# Patient Record
Sex: Female | Born: 1935 | Race: White | Smoking: Never smoker
Health system: Southern US, Community
[De-identification: ages and names within clinical notes are randomized; demographics above are authoritative.]

## PROBLEM LIST (undated history)

## (undated) DIAGNOSIS — Z950 Presence of cardiac pacemaker: Secondary | ICD-10-CM

## (undated) DIAGNOSIS — I10 Essential (primary) hypertension: Secondary | ICD-10-CM

## (undated) DIAGNOSIS — M1990 Unspecified osteoarthritis, unspecified site: Secondary | ICD-10-CM

## (undated) DIAGNOSIS — R001 Bradycardia, unspecified: Secondary | ICD-10-CM

## (undated) DIAGNOSIS — K219 Gastro-esophageal reflux disease without esophagitis: Secondary | ICD-10-CM

## (undated) DIAGNOSIS — I499 Cardiac arrhythmia, unspecified: Secondary | ICD-10-CM

## (undated) DIAGNOSIS — I82409 Acute embolism and thrombosis of unspecified deep veins of unspecified lower extremity: Secondary | ICD-10-CM

## (undated) DIAGNOSIS — I739 Peripheral vascular disease, unspecified: Secondary | ICD-10-CM

## (undated) HISTORY — PX: TONSILLECTOMY: SUR1361

## (undated) HISTORY — DX: Bradycardia, unspecified: R00.1

## (undated) HISTORY — PX: CHOLECYSTECTOMY: SHX55

## (undated) HISTORY — PX: ABDOMINAL HYSTERECTOMY: SHX81

## (undated) HISTORY — PX: AUGMENTATION MAMMAPLASTY: SUR837

## (undated) HISTORY — PX: BREAST ENHANCEMENT SURGERY: SHX7

## (undated) HISTORY — PX: APPENDECTOMY: SHX54

## (undated) HISTORY — PX: CATARACT EXTRACTION, BILATERAL: SHX1313

---

## 1998-11-29 ENCOUNTER — Encounter: Payer: Self-pay | Admitting: Cardiology

## 1998-11-29 ENCOUNTER — Inpatient Hospital Stay (HOSPITAL_COMMUNITY): Admission: EM | Admit: 1998-11-29 | Discharge: 1998-12-04 | Payer: Self-pay | Admitting: Emergency Medicine

## 1998-11-29 ENCOUNTER — Encounter: Payer: Self-pay | Admitting: Emergency Medicine

## 1998-12-02 HISTORY — PX: PACEMAKER INSERTION: SHX728

## 1999-01-03 ENCOUNTER — Encounter (HOSPITAL_COMMUNITY): Admission: RE | Admit: 1999-01-03 | Discharge: 1999-02-06 | Payer: Self-pay | Admitting: Cardiology

## 1999-02-07 ENCOUNTER — Encounter (HOSPITAL_COMMUNITY): Admission: RE | Admit: 1999-02-07 | Discharge: 1999-05-08 | Payer: Self-pay | Admitting: Cardiology

## 1999-08-07 ENCOUNTER — Encounter: Payer: Self-pay | Admitting: *Deleted

## 1999-08-07 ENCOUNTER — Ambulatory Visit (HOSPITAL_COMMUNITY): Admission: RE | Admit: 1999-08-07 | Discharge: 1999-08-07 | Payer: Self-pay | Admitting: *Deleted

## 1999-10-24 ENCOUNTER — Encounter: Admission: RE | Admit: 1999-10-24 | Discharge: 1999-11-09 | Payer: Self-pay | Admitting: Neurosurgery

## 1999-11-23 ENCOUNTER — Encounter: Admission: RE | Admit: 1999-11-23 | Discharge: 2000-01-09 | Payer: Self-pay | Admitting: Neurosurgery

## 1999-12-22 ENCOUNTER — Other Ambulatory Visit: Admission: RE | Admit: 1999-12-22 | Discharge: 1999-12-22 | Payer: Self-pay | Admitting: Obstetrics and Gynecology

## 2001-01-31 ENCOUNTER — Other Ambulatory Visit: Admission: RE | Admit: 2001-01-31 | Discharge: 2001-01-31 | Payer: Self-pay | Admitting: Obstetrics and Gynecology

## 2001-11-17 ENCOUNTER — Ambulatory Visit (HOSPITAL_COMMUNITY): Admission: RE | Admit: 2001-11-17 | Discharge: 2001-11-17 | Payer: Self-pay | Admitting: Neurosurgery

## 2001-11-17 ENCOUNTER — Encounter: Payer: Self-pay | Admitting: Neurosurgery

## 2004-01-11 ENCOUNTER — Encounter: Admission: RE | Admit: 2004-01-11 | Discharge: 2004-01-11 | Payer: Self-pay | Admitting: Internal Medicine

## 2007-02-28 ENCOUNTER — Ambulatory Visit (HOSPITAL_COMMUNITY): Admission: RE | Admit: 2007-02-28 | Discharge: 2007-03-01 | Payer: Self-pay | Admitting: *Deleted

## 2007-02-28 HISTORY — PX: PERMANENT PACEMAKER GENERATOR CHANGE: SHX6022

## 2007-10-31 ENCOUNTER — Emergency Department (HOSPITAL_COMMUNITY): Admission: EM | Admit: 2007-10-31 | Discharge: 2007-10-31 | Payer: Self-pay | Admitting: Emergency Medicine

## 2008-07-09 DIAGNOSIS — I82409 Acute embolism and thrombosis of unspecified deep veins of unspecified lower extremity: Secondary | ICD-10-CM

## 2008-07-09 HISTORY — DX: Acute embolism and thrombosis of unspecified deep veins of unspecified lower extremity: I82.409

## 2008-07-26 ENCOUNTER — Ambulatory Visit: Payer: Self-pay | Admitting: *Deleted

## 2008-08-18 ENCOUNTER — Ambulatory Visit: Payer: Self-pay | Admitting: Vascular Surgery

## 2008-10-07 ENCOUNTER — Emergency Department (HOSPITAL_COMMUNITY): Admission: EM | Admit: 2008-10-07 | Discharge: 2008-10-07 | Payer: Self-pay | Admitting: Emergency Medicine

## 2009-04-01 ENCOUNTER — Encounter: Admission: RE | Admit: 2009-04-01 | Discharge: 2009-04-01 | Payer: Self-pay | Admitting: Cardiovascular Disease

## 2009-08-16 ENCOUNTER — Encounter: Admission: RE | Admit: 2009-08-16 | Discharge: 2009-08-16 | Payer: Self-pay | Admitting: General Surgery

## 2009-10-04 ENCOUNTER — Ambulatory Visit (HOSPITAL_COMMUNITY): Admission: RE | Admit: 2009-10-04 | Discharge: 2009-10-05 | Payer: Self-pay | Admitting: Otolaryngology

## 2009-10-04 ENCOUNTER — Encounter (INDEPENDENT_AMBULATORY_CARE_PROVIDER_SITE_OTHER): Payer: Self-pay | Admitting: General Surgery

## 2010-09-21 ENCOUNTER — Other Ambulatory Visit: Payer: Self-pay | Admitting: Dermatology

## 2010-10-01 LAB — COMPREHENSIVE METABOLIC PANEL
CO2: 27 mEq/L (ref 19–32)
Chloride: 104 mEq/L (ref 96–112)
GFR calc Af Amer: >60 mL/min (ref >60)
Potassium: 4.3 mEq/L (ref 3.5–5.1)
Total Protein: 7.1 g/dL (ref 6.0–8.3)

## 2010-10-01 LAB — DIFFERENTIAL
Basophils Absolute: 0 10*3/uL (ref 0.0–0.1)
Basophils Relative: 0 % (ref 0–1)
Eosinophils Absolute: 0.1 10*3/uL (ref 0.0–0.7)
Eosinophils Relative: 1 % (ref 0–5)
Lymphocytes Relative: 13 % (ref 12–46)
Lymphs Abs: 1.5 10*3/uL (ref 0.7–4.0)
Monocytes Relative: 9 % (ref 3–12)
Neutro Abs: 9.6 10*3/uL — ABNORMAL HIGH (ref 1.7–7.7)
Neutrophils Relative %: 78 % — ABNORMAL HIGH (ref 43–77)

## 2010-10-01 LAB — CBC
HCT: 35.7 % — ABNORMAL LOW (ref 36.0–46.0)
Hemoglobin: 12.1 g/dL (ref 12.0–15.0)
Platelets: 288 10*3/uL (ref 150–400)
RBC: 3.78 MIL/uL — ABNORMAL LOW (ref 3.87–5.11)
RDW: 15 % (ref 11.5–15.5)

## 2010-11-21 NOTE — Op Note (Signed)
NAME:  Kathryn [name], Kathryn [name]               ACCOUNT NO.:  [id number]   MEDICAL RECORD NO.:  [id number]          PATIENT TYPE:  OIB   LOCATION:  2807                         FACILITY:  [facility]   PHYSICIAN:  Darlin Priestly, MD  DATE OF BIRTH:  Sep 02, 1927   DATE OF PROCEDURE:  02/28/2007  DATE OF DISCHARGE:                               OPERATIVE REPORT   PROCEDURES:  1. Explant of a Medtronic Kappa KDR 601 generator, serial [id number],      initially implanted Dec 02, 1998.  2. Atrial and ventricular lead interrogation.  3. Pocket modification.  4. Implant of a Medtronic Adapta [id number] generator, serial [id number].   COMPLICATIONS:  None.   INDICATION:  Ms. Assefa is a 77 year old female, patient of Dr. Franchot Heidelberg, Dr. Rodrigo Ran, with a history of mild mitral valve  prolapse, systemic hypertension, history of sick sinus syndrome status  post dual chamber pacer implant using a Medtronic Kappa KDR 601  generator, serial #[id number], with passive atrial and ventricular leads  on Dec 02, 1998.  She has recently reached ERI and now reverted to VVI  mode.  She is now referred for generator change out.   DESCRIPTION OF OPERATION:  After obtaining informed written consent, the  patient was brought up to the cardiac catheterization laboratory where  her right chest was prepped and draped in a sterile fashion.  ECG  monitoring was established.  Lidocaine 1% used then used to anesthetize  over the previously implanted generator.  Next, approximately a 3 cm  horizontal incision was then carried out over the top of her previous  implanted generator and hemostasis was obtained with electrocautery.  The tissue was then divided down to the pacer capsule and the capsule  was then opened with a scalpel and the generator was freed from the  capsule.  The generator and leads were then delivered.  The leads  appeared to be in excellent condition.  The atrial lead was a Medtronic  model [id number],  serial [id number], implanted Dec 02, 1998.  Ventricular lead  was a model [id number], serial [id number], also implanted Dec 02, 1998.  The  generator was then disconnected and the leads then interrogated.   P waves were measured at 3 millivolts.  Impedance on the atrial lead was  345 Ohms. Threshold on the atrial lead was 0.1 volts at 0.5  milliseconds.  Current was 1.4 milliamps.  R waves are measured at 15.5  millivolts.  Impedance 667 Ohms.  Threshold in the ventricle is 0.5  volts at 0.5 milliseconds.  Current is 1.4 milliamps.   The pocket is then copiously irrigated with 1% kanamycin solution.  Again, hemostasis was confirmed.  The leads were then connected in a  serial fashion to a Medtronic Adapta ADDR01 generator, serial  [id number].  Chassis screws were tightened and pacing was confirmed.  A  single silk suture was then placed in the superior aspect of the pocket.  The generator and leads were then delivered into the pocket and the  header was screwed to the silk suture.  Subcutaneous layers were then  closed with  running 2-0 Vicryl.  The skin was then closed using 4-0  Vicryl.  Steri-Strip was then applied.  The patient was returned to the  recovery room in stable condition.   CONCLUSIONS:  1. Successful explant of a Medtronic Kappa KDR 601 generator, serial      [id number], implanted Dec 02, 1998.  2. Interrogation of the atrial and ventricular leads with thresholds      noted above.  3. Pocket modification.  4. Implant of a Medtronic Adapta [id number] generator, serial [id number].      Darlin Priestly, MD  Electronically Signed     RHM/MEDQ  D:  02/28/2007  T:  03/01/2007  Job:  [id number]   cc:   Gerlene Burdock A. Alanda Amass, M.D.  Redge Gainer. Perini, M.D.

## 2010-11-21 NOTE — Procedures (Signed)
DUPLEX DEEP VENOUS EXAM - LOWER EXTREMITY   INDICATION:  Followup deep vein thrombosis.   HISTORY:  Edema:  No.  Trauma/Surgery:  The patient states previous bilateral vein surgeries  and laser ablation procedures.  Pain:  No.  PE:  No.  Previous DVT:  No, however, a superficial vein thrombosis was noted on a  study from 07/26/2008 here in the office.  Anticoagulants:  Yes.  Other:   DUPLEX EXAM:                CFV   SFV   PopV  PTV    GSV                R  L  R  L  R  L  R   L  R  L  Thrombosis    o  o  o  o  o  o  o   o  o  o  Spontaneous   +  +  +  +  +  +  +   +  +  +  Phasic        +  +  +  +  +  +  +   +  +  +  Augmentation  +  +  +  +  +  +  +   +  +  +  Compressible  +  +  +  +  +  +  +   +  +  +  Competent   Legend:  + - yes  o - no  p - partial  D - decreased   IMPRESSION:  1. No evidence of deep vein thrombosis noted in the bilateral lower      extremities.  2. Mild, partially occlusive chronic thrombus noted in the right      lesser saphenous vein at the proximal calf level.  3. A preliminary report was faxed to Dr. Laurey Morale office on      08/18/2008.    _____________________________  Larina Earthly, M.D.   CH/MEDQ  D:  08/19/2008  T:  08/19/2008  Job:  [id number]

## 2010-11-21 NOTE — Procedures (Signed)
DUPLEX DEEP VENOUS EXAM - LOWER EXTREMITY   INDICATION:  Right leg swelling and pain.   HISTORY:  Edema:  Trauma/Surgery:  No.  Pain:  No.  PE:  No.  Previous DVT:  No.  Anticoagulants:  No.  Other:   DUPLEX EXAM:                CFV   SFV   PopV  PTV    GSV                R  L  R  L  R  L  R   L  R  L  Thrombosis    o  o  o     o     o      o  Spontaneous   +  +  +     +     +      +  Phasic        +  +  +     +     +      +  Augmentation  +  +  +     +     +      +  Compressible  +  +  +     +     +      +  Competent     +  +  +     +     +      +   Legend:  + - yes  o - no  p - partial  D - decreased   IMPRESSION:  1. No evidence of deep venous thrombosis in the right lower extremity.  2. Sonographic evidence of superficial vein thrombosis noted in the      right lesser saphenous vein.    _____________________________  P. Liliane Bade, M.D.   AC/MEDQ  D:  07/27/2008  T:  07/27/2008  Job:  [id number]

## 2011-04-20 LAB — BASIC METABOLIC PANEL
Calcium: 9.2
GFR calc Af Amer: >60
GFR calc non Af Amer: >60
Sodium: 141

## 2011-04-20 LAB — CBC
MCHC: 33.5
Platelets: 204
RDW: 13.5
WBC: 5.4

## 2011-10-09 HISTORY — PX: US ECHOCARDIOGRAPHY: HXRAD669

## 2012-06-09 LAB — IFOBT (OCCULT BLOOD): IFOBT: NEGATIVE

## 2012-08-13 ENCOUNTER — Ambulatory Visit (INDEPENDENT_AMBULATORY_CARE_PROVIDER_SITE_OTHER): Payer: Medicare Other | Admitting: *Deleted

## 2012-08-13 DIAGNOSIS — M79609 Pain in unspecified limb: Secondary | ICD-10-CM

## 2012-12-09 ENCOUNTER — Other Ambulatory Visit: Payer: Self-pay | Admitting: Dermatology

## 2013-03-10 ENCOUNTER — Other Ambulatory Visit: Payer: Self-pay | Admitting: Internal Medicine

## 2013-03-10 DIAGNOSIS — M79604 Pain in right leg: Secondary | ICD-10-CM

## 2013-03-11 ENCOUNTER — Ambulatory Visit
Admission: RE | Admit: 2013-03-11 | Discharge: 2013-03-11 | Disposition: A | Discharge status: Home / Self Care | Payer: Medicare Other | Source: Ambulatory Visit | Attending: Internal Medicine | Admitting: Internal Medicine

## 2013-03-11 DIAGNOSIS — M79604 Pain in right leg: Secondary | ICD-10-CM

## 2013-03-11 IMAGING — CT CT L SPINE W/O CM
4 of 10 series · 11 of 33 positions shown, 13 images · non-contrast
Comparison: Plain films lumbar spine [DATE].

CLINICAL DATA: Right leg pain for 1 month.

CT LUMBAR SPINE WITHOUT CONTRAST
TECHNIQUE: Multidetector CT imaging of the lumbar spine was
performed without intravenous contrast administration. Multiplanar
CT image reconstructions were also generated.

[Series 4: l spine bone · axial · 0.27mm/px · z∈[-164,-82]mm · 2 of 100 slices shown]
[im 34/100  bone]
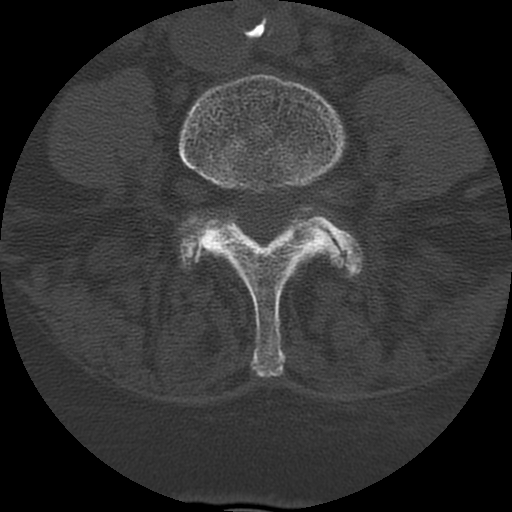
[im 67/100  bone]
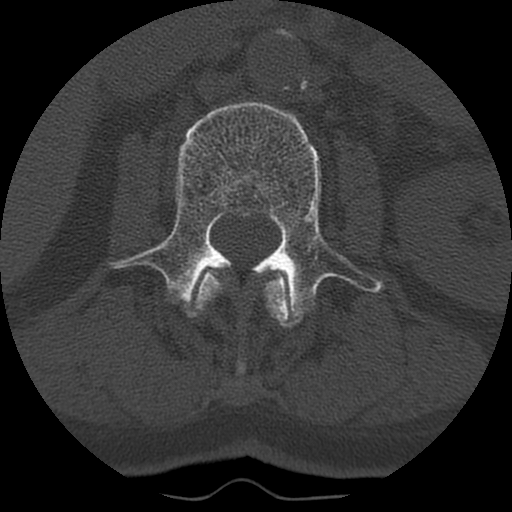

[Series 5: l spine detail · axial · 0.27mm/px · z∈[-187,-62]mm · 3 of 100 slices shown, 4 images]
[im 25/100  soft-tissue]
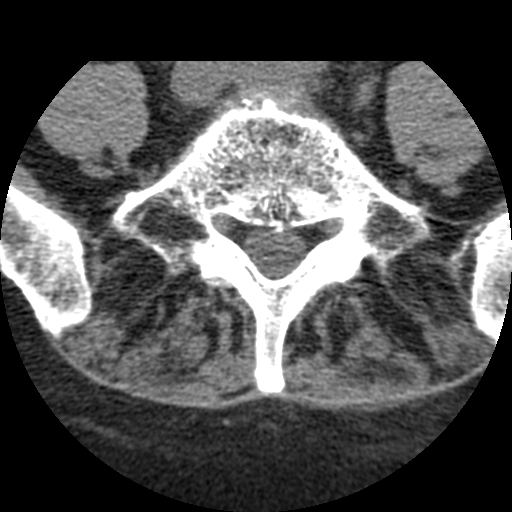
[im 25/100  bone]
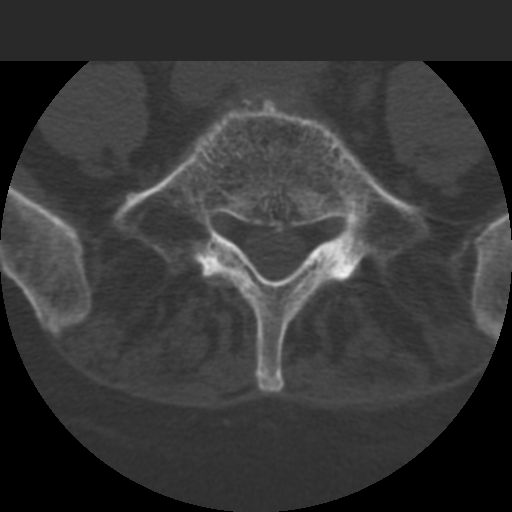
[im 50/100  bone]
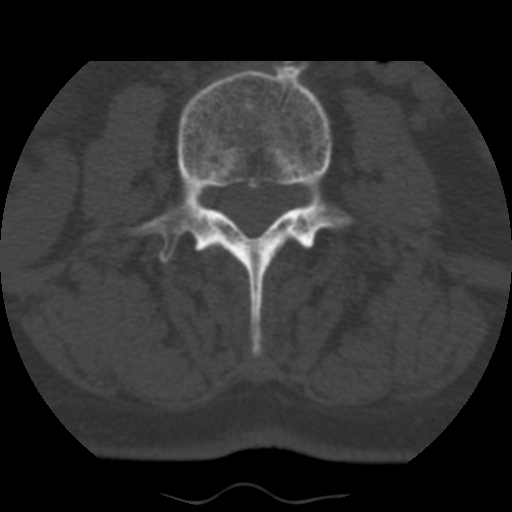
[im 75/100  bone]
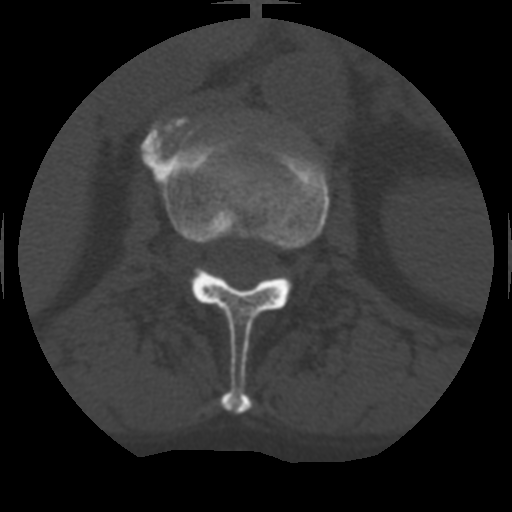

[Series 201: cor lower · coronal · 0.31mm/px · 1 of 51 slices shown]
[im 26/51  bone]
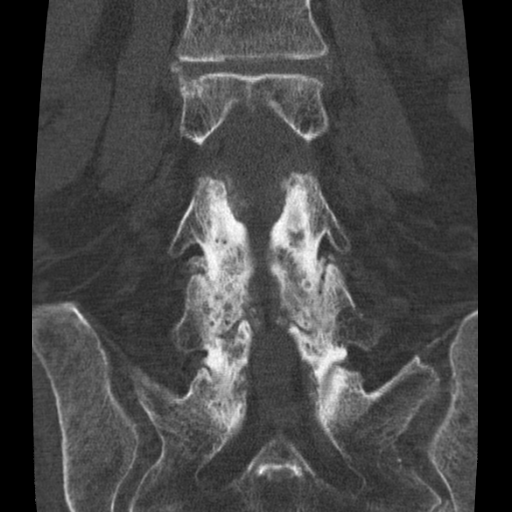

[Series 202: sag · sagittal · 0.50mm/px · 5 of 51 slices shown, 6 images]
[im 17/51  bone]
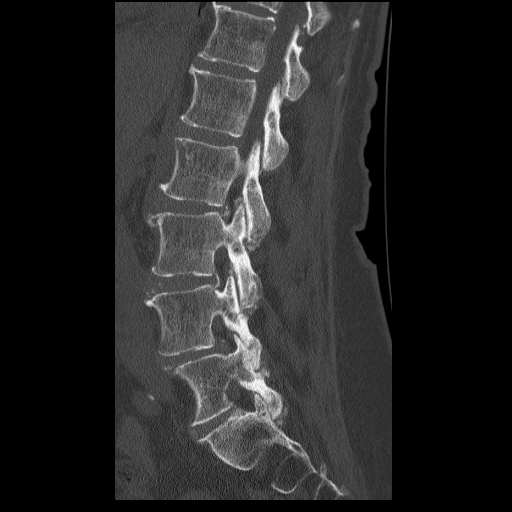
[im 21/51  bone]
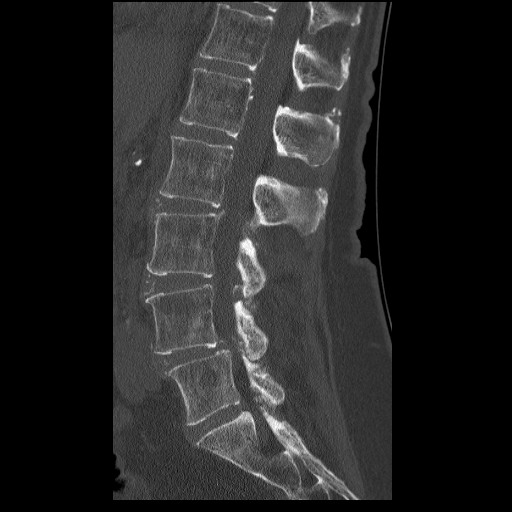
[im 26/51  soft-tissue]
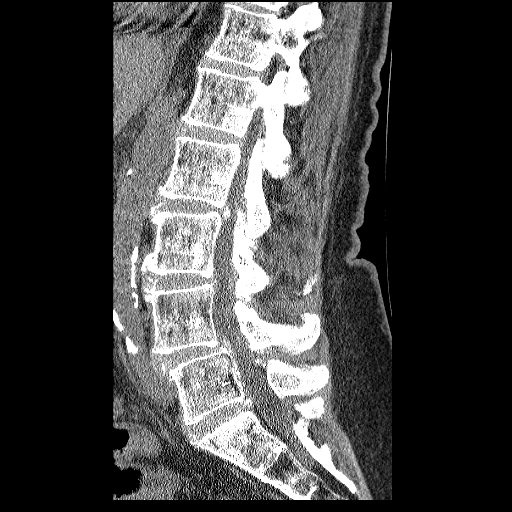
[im 26/51  bone]
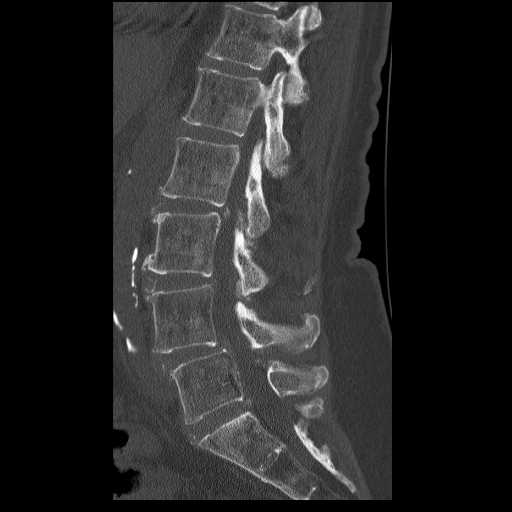
[im 30/51  bone]
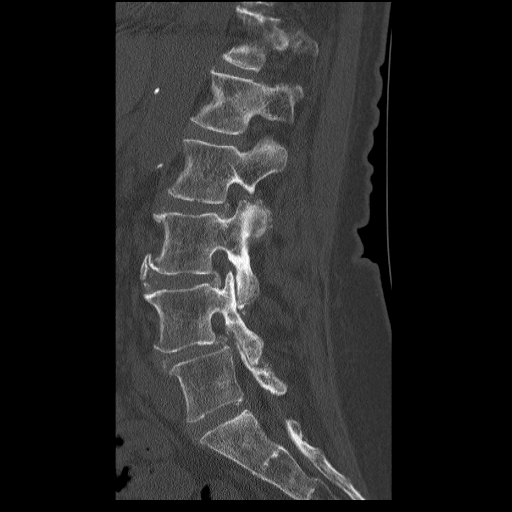
[im 34/51  bone]
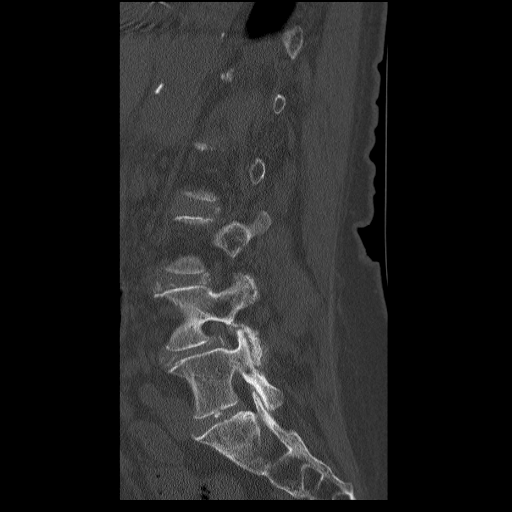

[11 of 33 positions shown; findings below may reference images not displayed]

FINDINGS: Vertebral body height is maintained.  0.4 cm of
anterolisthesis of L4 on L5 due to facet arthropathy is unchanged.
Alignment is otherwise unremarkable.  Visualized paraspinous
structures demonstrate scattered atherosclerosis in a nonaneurysmal
descending abdominal aorta.

The T11-12 level is imaged in the sagittal plane only.  Small
calcified central disc protrusion without central canal or
foraminal narrowing is present.

T12-L1:  Mild facet degenerative disease.  Otherwise negative.

L1-2:  Mild facet arthropathy.  Otherwise negative.

L2-3:  There is mild to moderate facet degenerative change and a
small calcified disc bulge.  Also seen is some ligamentum flavum
thickening.  There is mild to moderate central canal narrowing.
Foramina appear open.

L3-4:  Moderate facet degenerative change is seen with some
ligamentum flavum thickening and a shallow disc bulge.  There is
mild to moderate central canal narrowing.  Mild to moderate
foraminal narrowing appears worse on the left.

L4-5:  The patient has advanced bilateral facet arthropathy.  The
right facet joint is partially ankylosed.  The disc is uncovered
with a mild bulge and the ligamentum flavum is thickened.
Moderately severe to severe central canal and bilateral lateral
recess narrowing is present.  Mild to moderate foraminal narrowing
is identified.

L5-S1:  There is facet degenerative change.  No disc bulge or
protrusion.  Central canal and foramina are open.
IMPRESSION: Spondylosis appearing worst at L4-5 where there is moderately
severe to severe central canal and bilateral lateral recess
stenosis.  0.4 cm anterolisthesis L4 on L5 due to facet arthropathy
is identified.  The right facet joint is partially ankylosed.

## 2013-03-16 ENCOUNTER — Other Ambulatory Visit: Payer: Self-pay | Admitting: Internal Medicine

## 2013-03-16 DIAGNOSIS — M79605 Pain in left leg: Secondary | ICD-10-CM

## 2013-03-19 ENCOUNTER — Ambulatory Visit
Admission: RE | Admit: 2013-03-19 | Discharge: 2013-03-19 | Disposition: A | Discharge status: Home / Self Care | Payer: Medicare Other | Source: Ambulatory Visit | Attending: Internal Medicine | Admitting: Internal Medicine

## 2013-03-19 DIAGNOSIS — M79605 Pain in left leg: Secondary | ICD-10-CM

## 2013-03-19 MED ORDER — METHYLPREDNISOLONE ACETATE 40 MG/ML INJ SUSP (RADIOLOG
120.0000 mg | Freq: Once | INTRAMUSCULAR | Status: AC
  Administered 2013-03-19: 120 mg via EPIDURAL

## 2013-03-19 MED ORDER — IOHEXOL 180 MG/ML  SOLN
1.0000 mL | Freq: Once | INTRAMUSCULAR | Status: AC | PRN
  Administered 2013-03-19: 1 mL via EPIDURAL

## 2013-03-26 ENCOUNTER — Other Ambulatory Visit: Payer: Self-pay

## 2013-03-26 LAB — PACEMAKER DEVICE OBSERVATION
AL AMPLITUDE: 2.8 mv
ATRIAL PACING PM: 77
BAMS-0001: 150 {beats}/min
BATTERY VOLTAGE: 2.72 V

## 2013-04-02 ENCOUNTER — Encounter: Payer: Self-pay | Admitting: Cardiovascular Disease

## 2013-04-07 ENCOUNTER — Encounter: Payer: Self-pay | Admitting: Cardiovascular Disease

## 2013-04-07 ENCOUNTER — Other Ambulatory Visit: Payer: Self-pay | Admitting: Internal Medicine

## 2013-04-07 DIAGNOSIS — M79605 Pain in left leg: Secondary | ICD-10-CM

## 2013-04-14 ENCOUNTER — Ambulatory Visit
Admission: RE | Admit: 2013-04-14 | Discharge: 2013-04-14 | Disposition: A | Discharge status: Home / Self Care | Payer: Medicare Other | Source: Ambulatory Visit | Attending: Internal Medicine | Admitting: Internal Medicine

## 2013-04-14 DIAGNOSIS — M79605 Pain in left leg: Secondary | ICD-10-CM

## 2013-04-14 MED ORDER — IOHEXOL 180 MG/ML  SOLN
1.0000 mL | Freq: Once | INTRAMUSCULAR | Status: AC | PRN
  Administered 2013-04-14: 1 mL via EPIDURAL

## 2013-04-14 MED ORDER — METHYLPREDNISOLONE ACETATE 40 MG/ML INJ SUSP (RADIOLOG
120.0000 mg | Freq: Once | INTRAMUSCULAR | Status: AC
  Administered 2013-04-14: 120 mg via EPIDURAL

## 2013-08-27 ENCOUNTER — Emergency Department (HOSPITAL_COMMUNITY): Payer: Medicare Other

## 2013-08-27 ENCOUNTER — Encounter (HOSPITAL_COMMUNITY): Admission: EM | Disposition: A | Discharge status: Skilled Nursing Facility | Payer: Self-pay | Source: Home / Self Care

## 2013-08-27 ENCOUNTER — Inpatient Hospital Stay (HOSPITAL_COMMUNITY)
Admission: EM | Admit: 2013-08-27 | Discharge: 2013-09-03 | DRG: 330 | Disposition: A | Discharge status: Skilled Nursing Facility | Payer: Medicare Other | Attending: Surgery | Admitting: Surgery

## 2013-08-27 ENCOUNTER — Encounter (HOSPITAL_COMMUNITY): Payer: Medicare Other | Admitting: Certified Registered Nurse Anesthetist

## 2013-08-27 ENCOUNTER — Encounter (HOSPITAL_COMMUNITY): Payer: Self-pay | Admitting: Emergency Medicine

## 2013-08-27 ENCOUNTER — Inpatient Hospital Stay (HOSPITAL_COMMUNITY): Payer: Medicare Other | Admitting: Certified Registered Nurse Anesthetist

## 2013-08-27 DIAGNOSIS — Z950 Presence of cardiac pacemaker: Secondary | ICD-10-CM

## 2013-08-27 DIAGNOSIS — I252 Old myocardial infarction: Secondary | ICD-10-CM

## 2013-08-27 DIAGNOSIS — K5732 Diverticulitis of large intestine without perforation or abscess without bleeding: Secondary | ICD-10-CM

## 2013-08-27 DIAGNOSIS — R42 Dizziness and giddiness: Secondary | ICD-10-CM | POA: Diagnosis not present

## 2013-08-27 DIAGNOSIS — A419 Sepsis, unspecified organism: Secondary | ICD-10-CM

## 2013-08-27 DIAGNOSIS — K631 Perforation of intestine (nontraumatic): Secondary | ICD-10-CM

## 2013-08-27 DIAGNOSIS — Z885 Allergy status to narcotic agent status: Secondary | ICD-10-CM

## 2013-08-27 DIAGNOSIS — Z7982 Long term (current) use of aspirin: Secondary | ICD-10-CM

## 2013-08-27 DIAGNOSIS — Z79899 Other long term (current) drug therapy: Secondary | ICD-10-CM

## 2013-08-27 DIAGNOSIS — Z888 Allergy status to other drugs, medicaments and biological substances status: Secondary | ICD-10-CM

## 2013-08-27 DIAGNOSIS — Z5331 Laparoscopic surgical procedure converted to open procedure: Secondary | ICD-10-CM

## 2013-08-27 DIAGNOSIS — K560 Paralytic ileus: Secondary | ICD-10-CM | POA: Diagnosis not present

## 2013-08-27 DIAGNOSIS — I10 Essential (primary) hypertension: Secondary | ICD-10-CM | POA: Diagnosis present

## 2013-08-27 DIAGNOSIS — I059 Rheumatic mitral valve disease, unspecified: Secondary | ICD-10-CM | POA: Diagnosis present

## 2013-08-27 DIAGNOSIS — K668 Other specified disorders of peritoneum: Secondary | ICD-10-CM | POA: Diagnosis present

## 2013-08-27 HISTORY — PX: LAPAROTOMY: SHX154

## 2013-08-27 HISTORY — DX: Acute embolism and thrombosis of unspecified deep veins of unspecified lower extremity: I82.409

## 2013-08-27 LAB — URINE MICROSCOPIC-ADD ON

## 2013-08-27 LAB — CBC WITH DIFFERENTIAL/PLATELET
BASOS ABS: 0 10*3/uL (ref 0.0–0.1)
BASOS PCT: 0 % (ref 0–1)
EOS ABS: 0 10*3/uL (ref 0.0–0.7)
EOS PCT: 0 % (ref 0–5)
HCT: 38.1 % (ref 36.0–46.0)
Hemoglobin: 12.7 g/dL (ref 12.0–15.0)
Lymphocytes Relative: 5 % — ABNORMAL LOW (ref 12–46)
Lymphs Abs: 0.5 10*3/uL — ABNORMAL LOW (ref 0.7–4.0)
MCH: 30.8 pg (ref 26.0–34.0)
MCHC: 33.3 g/dL (ref 30.0–36.0)
MCV: 92.5 fL (ref 78.0–100.0)
Monocytes Absolute: 0.7 10*3/uL (ref 0.1–1.0)
Monocytes Relative: 8 % (ref 3–12)
Neutro Abs: 8 10*3/uL — ABNORMAL HIGH (ref 1.7–7.7)
Neutrophils Relative %: 87 % — ABNORMAL HIGH (ref 43–77)
PLATELETS: 220 10*3/uL (ref 150–400)
RBC: 4.12 MIL/uL (ref 3.87–5.11)
RDW: 13.1 % (ref 11.5–15.5)
WBC: 9.1 10*3/uL (ref 4.0–10.5)

## 2013-08-27 LAB — I-STAT CG4 LACTIC ACID, ED: Lactic Acid, Venous: 2.5 mmol/L — ABNORMAL HIGH (ref 0.5–2.2)

## 2013-08-27 LAB — URINALYSIS, ROUTINE W REFLEX MICROSCOPIC
Bilirubin Urine: NEGATIVE
GLUCOSE, UA: NEGATIVE mg/dL
Ketones, ur: NEGATIVE mg/dL
Nitrite: NEGATIVE
PH: 5.5 (ref 5.0–8.0)
Protein, ur: 30 mg/dL — AB
SPECIFIC GRAVITY, URINE: 1.02 (ref 1.005–1.030)
Urobilinogen, UA: 0.2 mg/dL (ref 0.0–1.0)

## 2013-08-27 LAB — COMPREHENSIVE METABOLIC PANEL
ALBUMIN: 3.9 g/dL (ref 3.5–5.2)
ALT: 16 U/L (ref 0–35)
AST: 23 U/L (ref 0–37)
Alkaline Phosphatase: 73 U/L (ref 39–117)
BUN: 17 mg/dL (ref 6–23)
CALCIUM: 10.3 mg/dL (ref 8.4–10.5)
CO2: 23 mEq/L (ref 19–32)
Chloride: 98 mEq/L (ref 96–112)
Creatinine, Ser: 0.87 mg/dL (ref 0.50–1.10)
GFR calc Af Amer: 69 mL/min — ABNORMAL LOW (ref >90)
GFR calc non Af Amer: 60 mL/min — ABNORMAL LOW (ref >90)
Glucose, Bld: 131 mg/dL — ABNORMAL HIGH (ref 70–99)
Potassium: 3.7 mEq/L (ref 3.7–5.3)
SODIUM: 137 meq/L (ref 137–147)
TOTAL PROTEIN: 7.2 g/dL (ref 6.0–8.3)
Total Bilirubin: 2.1 mg/dL — ABNORMAL HIGH (ref 0.3–1.2)

## 2013-08-27 LAB — TYPE AND SCREEN
ABO/RH(D): B NEG
Antibody Screen: POSITIVE
DAT, IgG: NEGATIVE

## 2013-08-27 LAB — PROTIME-INR
INR: 1.24 (ref 0.00–1.49)
Prothrombin Time: 15.3 seconds — ABNORMAL HIGH (ref 11.6–15.2)

## 2013-08-27 LAB — APTT: aPTT: 29 seconds (ref 24–37)

## 2013-08-27 LAB — LIPASE, BLOOD: Lipase: 28 U/L (ref 11–59)

## 2013-08-27 SURGERY — LAPAROTOMY, EXPLORATORY
Anesthesia: General | Site: Abdomen

## 2013-08-27 MED ORDER — ONDANSETRON HCL 4 MG/2ML IJ SOLN
4.0000 mg | Freq: Once | INTRAMUSCULAR | Status: AC
  Administered 2013-08-27: 4 mg via INTRAVENOUS
  Filled 2013-08-27: qty 2

## 2013-08-27 MED ORDER — FENTANYL CITRATE 0.05 MG/ML IJ SOLN: 25.0000 ug | INTRAMUSCULAR | Status: DC | PRN

## 2013-08-27 MED ORDER — SODIUM CHLORIDE 0.9 % IV SOLN
INTRAVENOUS | Status: DC
  Administered 2013-08-27: 16:00:00 via INTRAVENOUS

## 2013-08-27 MED ORDER — CISATRACURIUM BESYLATE (PF) 10 MG/5ML IV SOLN
INTRAVENOUS | Status: DC | PRN
  Administered 2013-08-27: 8 mg via INTRAVENOUS
  Administered 2013-08-27 (×2): 2 mg via INTRAVENOUS

## 2013-08-27 MED ORDER — NEOSTIGMINE METHYLSULFATE 1 MG/ML IJ SOLN
INTRAMUSCULAR | Status: DC | PRN
  Administered 2013-08-27: 3.5 mg via INTRAVENOUS

## 2013-08-27 MED ORDER — MEPERIDINE HCL 50 MG/ML IJ SOLN: 6.2500 mg | INTRAMUSCULAR | Status: DC | PRN

## 2013-08-27 MED ORDER — PROPOFOL 10 MG/ML IV BOLUS
INTRAVENOUS | Status: AC
  Filled 2013-08-27: qty 20

## 2013-08-27 MED ORDER — METOPROLOL TARTRATE 1 MG/ML IV SOLN
5.0000 mg | Freq: Four times a day (QID) | INTRAVENOUS | Status: DC | PRN
  Administered 2013-08-29 – 2013-08-30 (×2): 5 mg via INTRAVENOUS
  Filled 2013-08-27 (×2): qty 5

## 2013-08-27 MED ORDER — PIPERACILLIN-TAZOBACTAM 3.375 G IVPB
3.3750 g | Freq: Three times a day (TID) | INTRAVENOUS | Status: DC
  Administered 2013-08-27 – 2013-09-03 (×21): 3.375 g via INTRAVENOUS
  Filled 2013-08-27 (×23): qty 50

## 2013-08-27 MED ORDER — GLYCOPYRROLATE 0.2 MG/ML IJ SOLN
INTRAMUSCULAR | Status: AC
  Filled 2013-08-27: qty 3

## 2013-08-27 MED ORDER — IOHEXOL 300 MG/ML  SOLN
50.0000 mL | Freq: Once | INTRAMUSCULAR | Status: AC | PRN
  Administered 2013-08-27: 50 mL via ORAL

## 2013-08-27 MED ORDER — PIPERACILLIN-TAZOBACTAM 3.375 G IVPB
INTRAVENOUS | Status: AC
  Filled 2013-08-27: qty 50

## 2013-08-27 MED ORDER — SODIUM CHLORIDE 0.9 % IR SOLN
Status: DC | PRN
  Administered 2013-08-27: 10000 mL

## 2013-08-27 MED ORDER — SUCCINYLCHOLINE CHLORIDE 20 MG/ML IJ SOLN
INTRAMUSCULAR | Status: DC | PRN
  Administered 2013-08-27: 120 mg via INTRAVENOUS

## 2013-08-27 MED ORDER — PIPERACILLIN-TAZOBACTAM 3.375 G IVPB 30 MIN
3.3750 g | INTRAVENOUS | Status: AC
  Administered 2013-08-27: 3.375 g via INTRAVENOUS
  Filled 2013-08-27: qty 50

## 2013-08-27 MED ORDER — DEXAMETHASONE SODIUM PHOSPHATE 10 MG/ML IJ SOLN
INTRAMUSCULAR | Status: AC
  Filled 2013-08-27: qty 1

## 2013-08-27 MED ORDER — LIDOCAINE HCL (CARDIAC) 20 MG/ML IV SOLN
INTRAVENOUS | Status: AC
  Filled 2013-08-27: qty 5

## 2013-08-27 MED ORDER — FENTANYL CITRATE 0.05 MG/ML IJ SOLN
25.0000 ug | Freq: Once | INTRAMUSCULAR | Status: AC
  Administered 2013-08-27: 25 ug via INTRAVENOUS
  Filled 2013-08-27: qty 2

## 2013-08-27 MED ORDER — LIDOCAINE HCL (PF) 2 % IJ SOLN
INTRAMUSCULAR | Status: DC | PRN
  Administered 2013-08-27: 75 mg via INTRADERMAL

## 2013-08-27 MED ORDER — IOHEXOL 300 MG/ML  SOLN
100.0000 mL | Freq: Once | INTRAMUSCULAR | Status: AC | PRN
  Administered 2013-08-27: 100 mL via INTRAVENOUS

## 2013-08-27 MED ORDER — POTASSIUM CHLORIDE IN NACL 20-0.9 MEQ/L-% IV SOLN
INTRAVENOUS | Status: DC
  Administered 2013-08-27 – 2013-08-28 (×2): via INTRAVENOUS
  Administered 2013-08-28: 125 mL/h via INTRAVENOUS
  Administered 2013-08-29: 01:00:00 via INTRAVENOUS
  Filled 2013-08-27 (×8): qty 1000

## 2013-08-27 MED ORDER — PANTOPRAZOLE SODIUM 40 MG IV SOLR
40.0000 mg | INTRAVENOUS | Status: DC
  Administered 2013-08-27 – 2013-09-02 (×7): 40 mg via INTRAVENOUS
  Filled 2013-08-27 (×8): qty 40

## 2013-08-27 MED ORDER — GLYCOPYRROLATE 0.2 MG/ML IJ SOLN
INTRAMUSCULAR | Status: DC | PRN
  Administered 2013-08-27: 0.4 mg via INTRAVENOUS

## 2013-08-27 MED ORDER — FENTANYL CITRATE 0.05 MG/ML IJ SOLN
INTRAMUSCULAR | Status: DC | PRN
  Administered 2013-08-27: 100 ug via INTRAVENOUS
  Administered 2013-08-27 (×5): 50 ug via INTRAVENOUS

## 2013-08-27 MED ORDER — SODIUM CHLORIDE 0.9 % IV BOLUS (SEPSIS)
500.0000 mL | Freq: Once | INTRAVENOUS | Status: AC
  Administered 2013-08-27: 500 mL via INTRAVENOUS

## 2013-08-27 MED ORDER — VANCOMYCIN HCL IN DEXTROSE 1-5 GM/200ML-% IV SOLN
1000.0000 mg | INTRAVENOUS | Status: AC
  Administered 2013-08-27: 1000 mg via INTRAVENOUS
  Filled 2013-08-27: qty 200

## 2013-08-27 MED ORDER — MORPHINE SULFATE 2 MG/ML IJ SOLN
1.0000 mg | INTRAMUSCULAR | Status: DC | PRN
  Administered 2013-08-27 – 2013-08-28 (×7): 2 mg via INTRAVENOUS
  Administered 2013-08-29: 3 mg via INTRAVENOUS
  Administered 2013-08-29 – 2013-08-30 (×6): 2 mg via INTRAVENOUS
  Filled 2013-08-27 (×5): qty 1
  Filled 2013-08-27: qty 2
  Filled 2013-08-27 (×2): qty 1

## 2013-08-27 MED ORDER — ONDANSETRON HCL 4 MG/2ML IJ SOLN
INTRAMUSCULAR | Status: AC
  Filled 2013-08-27: qty 2

## 2013-08-27 MED ORDER — PROMETHAZINE HCL 25 MG/ML IJ SOLN: 6.2500 mg | INTRAMUSCULAR | Status: DC | PRN

## 2013-08-27 MED ORDER — FENTANYL CITRATE 0.05 MG/ML IJ SOLN
INTRAMUSCULAR | Status: AC
  Filled 2013-08-27: qty 5

## 2013-08-27 MED ORDER — MORPHINE SULFATE 2 MG/ML IJ SOLN
1.0000 mg | INTRAMUSCULAR | Status: DC | PRN
  Filled 2013-08-27 (×7): qty 1

## 2013-08-27 MED ORDER — CISATRACURIUM BESYLATE 20 MG/10ML IV SOLN
INTRAVENOUS | Status: AC
  Filled 2013-08-27: qty 10

## 2013-08-27 MED ORDER — DEXAMETHASONE SODIUM PHOSPHATE 10 MG/ML IJ SOLN
INTRAMUSCULAR | Status: DC | PRN
  Administered 2013-08-27: 5 mg via INTRAVENOUS

## 2013-08-27 MED ORDER — PROPOFOL 10 MG/ML IV BOLUS
INTRAVENOUS | Status: DC | PRN
  Administered 2013-08-27: 150 mg via INTRAVENOUS

## 2013-08-27 MED ORDER — VANCOMYCIN HCL 500 MG IV SOLR
500.0000 mg | Freq: Two times a day (BID) | INTRAVENOUS | Status: DC
  Filled 2013-08-27: qty 500

## 2013-08-27 MED ORDER — LACTATED RINGERS IV SOLN
INTRAVENOUS | Status: DC | PRN
  Administered 2013-08-27 (×3): via INTRAVENOUS

## 2013-08-27 MED ORDER — BUPIVACAINE-EPINEPHRINE 0.5% -1:200000 IJ SOLN
INTRAMUSCULAR | Status: AC
  Filled 2013-08-27: qty 1

## 2013-08-27 MED ORDER — ACETAMINOPHEN 325 MG PO TABS
650.0000 mg | ORAL_TABLET | Freq: Once | ORAL | Status: AC
  Administered 2013-08-27: 650 mg via ORAL
  Filled 2013-08-27: qty 2

## 2013-08-27 MED ORDER — ONDANSETRON HCL 4 MG/2ML IJ SOLN
4.0000 mg | Freq: Four times a day (QID) | INTRAMUSCULAR | Status: DC | PRN
  Administered 2013-08-31 – 2013-09-03 (×6): 4 mg via INTRAVENOUS
  Filled 2013-08-27 (×6): qty 2

## 2013-08-27 MED ORDER — FENTANYL CITRATE 0.05 MG/ML IJ SOLN
INTRAMUSCULAR | Status: AC
  Filled 2013-08-27: qty 2

## 2013-08-27 MED ORDER — VANCOMYCIN HCL 500 MG IV SOLR
500.0000 mg | Freq: Two times a day (BID) | INTRAVENOUS | Status: DC
  Administered 2013-08-28 – 2013-08-29 (×3): 500 mg via INTRAVENOUS
  Filled 2013-08-27 (×4): qty 500

## 2013-08-27 SURGICAL SUPPLY — 52 items
APPLICATOR COTTON TIP 6IN STRL (MISCELLANEOUS) IMPLANT
BLADE EXTENDED COATED 6.5IN (ELECTRODE) ×3 IMPLANT
BLADE HEX COATED 2.75 (ELECTRODE) ×3 IMPLANT
CANISTER SUCTION 2500CC (MISCELLANEOUS) IMPLANT
COVER MAYO STAND STRL (DRAPES) ×6 IMPLANT
DRAPE LAPAROSCOPIC ABDOMINAL (DRAPES) ×3 IMPLANT
DRAPE WARM FLUID 44X44 (DRAPE) ×3 IMPLANT
ELECT REM PT RETURN 9FT ADLT (ELECTROSURGICAL) ×3
ELECTRODE REM PT RTRN 9FT ADLT (ELECTROSURGICAL) ×1 IMPLANT
GLOVE BIOGEL PI IND STRL 7.0 (GLOVE) ×1 IMPLANT
GLOVE BIOGEL PI INDICATOR 7.0 (GLOVE) ×2
GLOVE SURG SIGNA 7.5 PF LTX (GLOVE) ×3 IMPLANT
GOWN STRL REUS W/TWL LRG LVL3 (GOWN DISPOSABLE) ×3 IMPLANT
GOWN STRL REUS W/TWL XL LVL3 (GOWN DISPOSABLE) ×24 IMPLANT
KIT BASIN OR (CUSTOM PROCEDURE TRAY) ×3 IMPLANT
LIGASURE IMPACT 36 18CM CVD LR (INSTRUMENTS) ×3 IMPLANT
MANIFOLD NEPTUNE II (INSTRUMENTS) ×3 IMPLANT
NS IRRIG 1000ML POUR BTL (IV SOLUTION) ×3 IMPLANT
PACK GENERAL/GYN (CUSTOM PROCEDURE TRAY) ×3 IMPLANT
POUCH OSTOMY 1 PC DRNBL  2 1/2 (OSTOMY) ×1 IMPLANT
POUCH OSTOMY 2  H (OSTOMY) ×3 IMPLANT
POUCH OSTOMY 2 1/2 (OSTOMY) ×2
SPONGE GAUZE 4X4 12PLY (GAUZE/BANDAGES/DRESSINGS) ×3 IMPLANT
SPONGE LAP 18X18 X RAY DECT (DISPOSABLE) ×3 IMPLANT
STAPLER CUT CVD 40MM BLUE (STAPLE) ×3 IMPLANT
STAPLER PROXIMATE 75MM BLUE (STAPLE) ×3 IMPLANT
STAPLER VISISTAT 35W (STAPLE) ×3 IMPLANT
SUCTION POOLE TIP (SUCTIONS) IMPLANT
SUT NOVA 1 T20/GS 25DT (SUTURE) ×3 IMPLANT
SUT NOVA T20/GS 25 (SUTURE) ×3 IMPLANT
SUT PDS AB 1 CTX 36 (SUTURE) IMPLANT
SUT PROLENE 2 0 KS (SUTURE) ×3 IMPLANT
SUT SILK 2 0 (SUTURE) ×2
SUT SILK 2 0 SH CR/8 (SUTURE) ×6 IMPLANT
SUT SILK 2-0 18XBRD TIE 12 (SUTURE) ×1 IMPLANT
SUT SILK 3 0 (SUTURE) ×2
SUT SILK 3 0 SH CR/8 (SUTURE) ×3 IMPLANT
SUT SILK 3-0 18XBRD TIE 12 (SUTURE) ×1 IMPLANT
SUT VIC AB 2-0 SH 27 (SUTURE) ×2
SUT VIC AB 2-0 SH 27X BRD (SUTURE) ×1 IMPLANT
SUT VIC AB 3-0 SH 8-18 (SUTURE) ×6 IMPLANT
SUT VICRYL 0 UR6 27IN ABS (SUTURE) ×3 IMPLANT
SUT VICRYL 2 0 18  UND BR (SUTURE)
SUT VICRYL 2 0 18 UND BR (SUTURE) IMPLANT
SWAB COLLECTION DEVICE MRSA (MISCELLANEOUS) ×3 IMPLANT
TAPE CLOTH SURG 4X10 WHT LF (GAUZE/BANDAGES/DRESSINGS) ×3 IMPLANT
TOWEL OR 17X26 10 PK STRL BLUE (TOWEL DISPOSABLE) ×6 IMPLANT
TRAY FOLEY CATH 14FRSI W/METER (CATHETERS) IMPLANT
TROCAR XCEL BLUNT TIP 100MML (ENDOMECHANICALS) ×3 IMPLANT
TUBE ANAEROBIC SPECIMEN COL (MISCELLANEOUS) ×3 IMPLANT
WATER STERILE IRR 1500ML POUR (IV SOLUTION) ×3 IMPLANT
YANKAUER SUCT BULB TIP NO VENT (SUCTIONS) IMPLANT

## 2013-08-27 NOTE — H&P (Addendum)
Kathryn [name] is an 80 y.o. female.   Cardiology:  Rollene Fare PCP: Jerlyn Ly, MD  Chief Complaint: Abdominal pain. HPI: Healty 80 y/o with hx of PTVP  Medtronic ADDR01, and some high blood pressure.   She was fine on 08/25/13.  Yesterday 2/17 she started having pain in the lower abdomen, she thought it was gas and tried some coke.  It didn't help.  She wasn't really hungry, she had some nausea, no vomiting yesterday.  Pain persisted all day and all of last PM.  She had more nausea this AM, some vomiting, worsening pain and she finally came to the ER after calling PCP. No constipation, no diarrhea, no GI issue till yesterday morning.   Work up in the ER shows glucose 131, but otherwise normal.  Lactic acid up to 2.5.  Wbc is 9.7 with left shift.  PT, PTT are normal.  A CT of the abdomen was obtained ans shows free air and free fluid consistent with a perforation. Multiple small bowel loops demonstrate mild scattered wall thickening and minimal wall hyperenhancement consistent with an enteritis,, differential diagnosis including infection and Crohn's disease, ischemia less likely but not completely excluded.  Minimal sigmoid diverticulosis without evidence of diverticulitis.  We have been called to see the patient.     Past Medical History  Diagnosis Date  . MI, old Hypertension     Past Surgical History  Procedure Laterality Date   Appendectomy     Cholecystectomy     Hysterectomy     Breast Augmentation    . Pacemaker insertion Last revision 02/2007   Family History:  Father deceased with cancer   Mother deceased with Alzheimer    1 brother and sister, the sister was sick last year but she doesn't really know what the issue was. Social History:   Tobacco:  None Drugs:  None ETOH:  None  Retired Network engineer    Allergies:  Allergies  Allergen Reactions  . Darvocet [Propoxyphene N-Acetaminophen] Other (See Comments)    Hallucination   . Epinephrine Other (See Comments)   Severe increase in heart rate  . Percocet [Oxycodone-Acetaminophen] Other (See Comments)    Hallucinations    Prior to Admission medications   Medication Sig Start Date End Date Taking? Authorizing Provider  aspirin EC 81 MG tablet Take 81 mg by mouth daily.   Yes Historical Provider, MD  metoprolol tartrate (LOPRESSOR) 25 MG tablet Take 1 tablet by mouth 2 (two) times daily. 08/11/13  Yes Historical Provider, MD  valsartan (DIOVAN) 80 MG tablet Take 1 tablet by mouth every morning. 07/25/13  Yes Historical Provider, MD      Results for orders placed during the hospital encounter of 08/27/13 (from the past 48 hour(s))  CBC WITH DIFFERENTIAL     Status: Abnormal   Collection Time    08/27/13 11:50 AM      Result Value Ref Range   WBC 9.1  4.0 - 10.5 K/uL   RBC 4.12  3.87 - 5.11 MIL/uL   Hemoglobin 12.7  12.0 - 15.0 g/dL   HCT 38.1  36.0 - 46.0 %   MCV 92.5  78.0 - 100.0 fL   MCH 30.8  26.0 - 34.0 pg   MCHC 33.3  30.0 - 36.0 g/dL   RDW 13.1  11.5 - 15.5 %   Platelets 220  150 - 400 K/uL   Neutrophils Relative % 87 (*) 43 - 77 %   Neutro Abs 8.0 (*) 1.7 - 7.7  K/uL   Lymphocytes Relative 5 (*) 12 - 46 %   Lymphs Abs 0.5 (*) 0.7 - 4.0 K/uL   Monocytes Relative 8  3 - 12 %   Monocytes Absolute 0.7  0.1 - 1.0 K/uL   Eosinophils Relative 0  0 - 5 %   Eosinophils Absolute 0.0  0.0 - 0.7 K/uL   Basophils Relative 0  0 - 1 %   Basophils Absolute 0.0  0.0 - 0.1 K/uL  COMPREHENSIVE METABOLIC PANEL     Status: Abnormal   Collection Time    08/27/13 11:50 AM      Result Value Ref Range   Sodium 137  137 - 147 mEq/L   Potassium 3.7  3.7 - 5.3 mEq/L   Chloride 98  96 - 112 mEq/L   CO2 23  19 - 32 mEq/L   Glucose, Bld 131 (*) 70 - 99 mg/dL   BUN 17  6 - 23 mg/dL   Creatinine, Ser 0.87  0.50 - 1.10 mg/dL   Calcium 10.3  8.4 - 10.5 mg/dL   Total Protein 7.2  6.0 - 8.3 g/dL   Albumin 3.9  3.5 - 5.2 g/dL   AST 23  0 - 37 U/L   ALT 16  0 - 35 U/L   Alkaline Phosphatase 73  39 - 117 U/L    Total Bilirubin 2.1 (*) 0.3 - 1.2 mg/dL   GFR calc non Af Amer 60 (*) >90 mL/min   GFR calc Af Amer 69 (*) >90 mL/min   Comment: (NOTE)     The eGFR has been calculated using the CKD EPI equation.     This calculation has not been validated in all clinical situations.     eGFR's persistently <90 mL/min signify possible Chronic Kidney     Disease.  LIPASE, BLOOD     Status: None   Collection Time    08/27/13 11:50 AM      Result Value Ref Range   Lipase 28  11 - 59 U/L  I-STAT CG4 LACTIC ACID, ED     Status: Abnormal   Collection Time    08/27/13 11:55 AM      Result Value Ref Range   Lactic Acid, Venous 2.50 (*) 0.5 - 2.2 mmol/L  URINALYSIS, ROUTINE W REFLEX MICROSCOPIC     Status: Abnormal   Collection Time    08/27/13 12:36 PM      Result Value Ref Range   Color, Urine AMBER (*) YELLOW   Comment: BIOCHEMICALS MAY BE AFFECTED BY COLOR   APPearance CLEAR  CLEAR   Specific Gravity, Urine 1.020  1.005 - 1.030   pH 5.5  5.0 - 8.0   Glucose, UA NEGATIVE  NEGATIVE mg/dL   Hgb urine dipstick SMALL (*) NEGATIVE   Bilirubin Urine NEGATIVE  NEGATIVE   Ketones, ur NEGATIVE  NEGATIVE mg/dL   Protein, ur 30 (*) NEGATIVE mg/dL   Urobilinogen, UA 0.2  0.0 - 1.0 mg/dL   Nitrite NEGATIVE  NEGATIVE   Leukocytes, UA SMALL (*) NEGATIVE  URINE MICROSCOPIC-ADD ON     Status: Abnormal   Collection Time    08/27/13 12:36 PM      Result Value Ref Range   Squamous Epithelial / LPF FEW (*) RARE   WBC, UA 0-2  <3 WBC/hpf   RBC / HPF 0-2  <3 RBC/hpf   Bacteria, UA FEW (*) RARE   Casts HYALINE CASTS (*) NEGATIVE   Urine-Other MUCOUS PRESENT  Ct Abdomen Pelvis W Contrast  08/27/2013   CLINICAL DATA:  Abdominal cramping and nausea beginning 1 day ago, diffuse abdominal pain, fever, diarrhea  EXAM: CT ABDOMEN AND PELVIS WITH CONTRAST  TECHNIQUE: Multidetector CT imaging of the abdomen and pelvis was performed using the standard protocol following bolus administration of intravenous contrast.   CONTRAST:  17m OMNIPAQUE IOHEXOL 300 MG/ML SOLN ORALLY, 1071mOMNIPAQUE IOHEXOL 300 MG/ML SOLN IV  COMPARISON:  08/16/2009  FINDINGS: Capsular calcification at bilateral breast prostheses.  Pacemaker lead right ventricle.  Minimal bibasilar atelectasis.  Post cholecystectomy.  Low-attenuation lesion again identified at the lateral segment left lobe liver, 2.5 x 2.5 cm showing fill-in on delayed images question hemangioma; prior multi phase CT imaging of the abdomen showed caries should sixth of a hepatic hemangioma as well.  Remainder of liver, spleen, pancreas, kidneys, and adrenal glands normal.  Scattered foci of free intraperitoneal air in upper abdomen and foci of extraluminal gas within the mesentery consistent with perforated viscus.  Free intraperitoneal fluid perihepatic and in pelvis ; no extravasated GI contrast identified.  Several small bowel loops in the mid abdomen and pelvis demonstrate mild diffuse wall thickening consistent with an enteritis.  Several of these loops show hyper enhancement of the bowel wall suggesting inflammation.  Small intraluminal nodular focus identified within a small bowel loop question retained GI contrast, thickened fold or nodule axial image 34, coronal image 23.  Stomach decompressed.  Minimal distal it sigmoid diverticulosis without definite evidence of diverticulitis.  Remainder of colon unremarkable.  No additional mass, adenopathy, or hernia.  Scattered atherosclerotic calcifications.  Bones unremarkable.  IMPRESSION: Free air and free fluid consistent with perforated viscus.  Multiple small bowel loops demonstrate mild scattered wall thickening and minimal wall hyperenhancement consistent with an enteritis,, differential diagnosis including infection and Crohn's disease, ischemia less likely but not completely excluded.  Minimal sigmoid diverticulosis without evidence of diverticulitis.  Question retained contrast versus a thickened fold or tiny nodule or polyp  within a small bowel loop in the right mid abdomen.   Electronically Signed   By: MaLavonia Dana.D.   On: 08/27/2013 13:38    Review of Systems  Constitutional: Positive for chills (at home, she did not take temp at home). Negative for weight loss, malaise/fatigue and diaphoresis. Fever: 101 in ER.  Eyes: Negative.   Respiratory: Negative.   Cardiovascular: Negative.        Right upper chest PTVP BILATERAL breast implants   Gastrointestinal: Positive for heartburn, nausea (some yesterday and today), vomiting (some today) and abdominal pain (started lower abdomen yesterday, thought it was gas.  Got progressively worse over the day.). Negative for diarrhea, constipation, blood in stool and melena.       Lower mid line incision.  Genitourinary: Negative.   Musculoskeletal: Negative.   Skin: Negative.   Neurological: Negative.  Negative for weakness.  Endo/Heme/Allergies: Negative.   Psychiatric/Behavioral: Negative.     Blood pressure 135/55, pulse 85, temperature 101 F (38.3 C), temperature source Oral, resp. rate 20, SpO2 92.00%. Physical Exam  Constitutional: She is oriented to person, place, and time. She appears well-developed and well-nourished. No distress.  Hr 96 RR 12 BP 125/57 94% r/a  HENT:  Head: Normocephalic and atraumatic.  Nose: Nose normal.  Eyes: Conjunctivae and EOM are normal. Pupils are equal, round, and reactive to light. Right eye exhibits no discharge. Left eye exhibits no discharge. No scleral icterus.  Neck: Normal range of motion. Neck supple. No JVD  present. No tracheal deviation present. No thyromegaly present.  Cardiovascular: Normal rate, regular rhythm, normal heart sounds and intact distal pulses.  Exam reveals no gallop.   No murmur heard. Paced 96 now   Respiratory: Effort normal and breath sounds normal. No respiratory distress. She has no wheezes. She has no rales. She exhibits no tenderness.  Hurts to breath deep or turn over  GI: Soft. She  exhibits distension (minimal). She exhibits no mass. There is tenderness (hurts all over but mostly lower abdomen). There is rebound and guarding.  Musculoskeletal: She exhibits no edema and no tenderness.  Lymphadenopathy:    She has no cervical adenopathy.  Neurological: She is alert and oriented to person, place, and time. No cranial nerve deficit.  Skin: Skin is warm and dry. No rash noted. She is not diaphoretic. No erythema. No pallor.  Psychiatric: She has a normal mood and affect. Her behavior is normal. Judgment and thought content normal.     Assessment/Plan 1.  Perforated Viscus of uncertain etiology 2.  Hx of MI with PTVP 3.  Hypertension 4.  Prior abdominal surgeries.  Plan;  Hydrate, antibiotics and take to OR later this afternoon for exploratory laparotomy.  JENNINGS,WILLARD 08/27/2013, 2:37 PM   Agree with above. Discussed with patient and friend, Eliane Decree. We do not know where the perforation is, but I thinks that she needs an abdominal exploration.  I discussed possible ostomy. I discussed risks, including bleeding, ostomy, and significant medical problems (heart).  Will proceed with surgery tonight. Married, but husband is not in shape to come to the hospital. Son, Farrel Gordon, is coming back. Son, Darnell Level, lives in [place]. Has one sone out of state.  Alphonsa Overall, MD, [facility] Pager: [phone] Office phone:  [phone]

## 2013-08-27 NOTE — Preoperative (Signed)
Beta Blockers   Reason not to administer Beta Blockers:Hold beta blocker due to hypotension, Hold beta blocker due to hypovolemia, Will hold for surgery with potential for hypotension/hypovolemia.  Will administer intraop if needed.

## 2013-08-27 NOTE — Transfer of Care (Signed)
Immediate Anesthesia Transfer of Care Note  Patient: Kathryn [name]  Procedure(s) Performed: Procedure(s): LAPAROSCOPY CONVERTED TO OPEN LAPAROTOMY WITH SIGMOID COLECTOMY AND COLOSTOMY (N/A)  Patient Location: PACU  Anesthesia Type:General  Level of Consciousness: awake, alert  and patient cooperative  Airway & Oxygen Therapy: Patient Spontanous Breathing and Patient connected to face mask oxygen  Post-op Assessment: Report given to PACU RN and Post -op Vital signs reviewed and stable  Post vital signs: Reviewed and stable  Complications: No apparent anesthesia complications

## 2013-08-27 NOTE — Progress Notes (Signed)
Dr Marcell Barlow aware of 20 mls urine output, received 750 mls in PACU, all VSS stable, to go to ICU. No orders received. [employer]

## 2013-08-27 NOTE — ED Provider Notes (Signed)
TIME SEEN: 11:20 AM  CHIEF COMPLAINT: Abdominal pain, nausea, diarrhea  HPI: Patient is an 80 year old female with a history of SSS status post pacemaker, HTN,  MVP who presents to the emergency department with complaints of crampy diffuse abdominal pain that started yesterday morning. She's had nausea but denies vomiting. She has had minimal amount of diarrhea. No dysuria or hematuria. No sick contacts. No bad food exposure. She is status post appendectomy, hysterectomyand cholecystectomy. She denies any chest pain, back pain, numbness or focal weakness, or shortness of breath.  PCP is Perini with [facility]  ROS: See HPI Constitutional: fever  Eyes: no drainage  ENT: no runny nose   Cardiovascular:  no chest pain  Resp: no SOB  GI: no vomiting GU: no dysuria Integumentary: no rash  Allergy: no hives  Musculoskeletal: no leg swelling  Neurological: no slurred speech ROS otherwise negative  PAST MEDICAL HISTORY/PAST SURGICAL HISTORY:  Past Medical History  Diagnosis Date  . MI, old     MEDICATIONS:  Prior to Admission medications   Not on File    ALLERGIES:  Allergies  Allergen Reactions  . Darvocet [Propoxyphene N-Acetaminophen] Other (See Comments)    Hallucination   . Epinephrine Other (See Comments)    Severe increase in heart rate  . Percocet [Oxycodone-Acetaminophen] Other (See Comments)    Hallucinations     SOCIAL HISTORY:  History  Substance Use Topics  . Smoking status: Not on file  . Smokeless tobacco: Not on file  . Alcohol Use: No    FAMILY HISTORY: No family history on file.  EXAM: BP 156/66  Pulse 104  Temp(Src) 101 F (38.3 C) (Oral)  Resp 20  SpO2 98% CONSTITUTIONAL: Alert and oriented and responds appropriately to questions. Well-appearing; well-nourished, elderly, appears uncomfortable but nontoxic, febrile HEAD: Normocephalic EYES: Conjunctivae clear, PERRL ENT: normal nose; no rhinorrhea; moist mucous membranes; pharynx  without lesions noted NECK: Supple, no meningismus, no LAD  CARD:  Regular, tachycardic; S1 and S2 appreciated; no murmurs, no clicks, no rubs, no gallops RESP: Normal chest excursion without splinting or tachypnea; breath sounds clear and equal bilaterally; no wheezes, no rhonchi, no rales,  ABD/GI: Normal bowel sounds; non-distended; soft but diffusely tender to palpation without rebound, voluntary guarding diffusely BACK:  The back appears normal and is non-tender to palpation, there is no CVA tenderness EXT: Normal ROM in all joints; non-tender to palpation; no edema; normal capillary refill; no cyanosis    SKIN: Normal color for age and race; warm NEURO: Moves all extremities equally, normal gait, cranial nerves II through XII intact, sensation to light touch intact diffusely PSYCH: The patient's mood and manner are appropriate. Grooming and personal hygiene are appropriate.  MEDICAL DECISION MAKING: Patient here with diffuse abdominal pain, nausea diarrhea, fever. Differential diagnosis includes colitis, diverticulitis, UTI, pancreatitis. Will obtain abdominal labs, urine, lactate, CT abdomen pelvis. We'll give IV fluids, pain and nausea medicine.  ED PROGRESS: Patient's labs show predominant neutrophils, elevated lactate. She is still normotensive. She is receiving broad-spectrum antibiotics. Discussed with radiology, patient has a likely small bowel perforation with free air and free fluid in the abdomen. Will discuss with general surgery.  1:56 PM  Pt has had no further nausea, she reports her pain is improved and she looks much better but still has a concerning abdominal exam. Discussed with Earnstine Regal, PA with general surgery who will see the patient. He is asked for a medicine consult.  2:15 PM  Last pressure 135/55.  HR in 80s.  Spoke with hospitalist who will see pt in consult.   CRITICAL CARE Performed by: Nyra Jabs   Total critical care time: 45 minutes  Critical  care time was exclusive of separately billable procedures and treating other patients.  Critical care was necessary to treat or prevent imminent or life-threatening deterioration.  Critical care was time spent personally by me on the following activities: development of treatment plan with patient and/or surrogate as well as nursing, discussions with consultants, evaluation of patient's response to treatment, examination of patient, obtaining history from patient or surrogate, ordering and performing treatments and interventions, ordering and review of laboratory studies, ordering and review of radiographic studies, pulse oximetry and re-evaluation of patient's condition.     EKG Interpretation    Date/Time:  Thursday August 27 2013 14:33:47 EST Ventricular Rate:  93 PR Interval:  213 QRS Duration: 147 QT Interval:  390 QTC Calculation: 485 R Axis:   -85 Text Interpretation:  Atrial-sensed ventricular-paced rhythm No further analysis attempted due to paced rhythm Confirmed by Novah Goza  DO, Shown Dissinger ([id number]) on 08/27/2013 2:38:07 PM             Furman, DO 08/27/13 1438

## 2013-08-27 NOTE — Progress Notes (Signed)
ANTIBIOTIC CONSULT NOTE - INITIAL  Pharmacy Consult for Zosyn, vancomycin Indication: intra-abdominal infection, perforated viscus  Allergies  Allergen Reactions  . Darvocet [Propoxyphene N-Acetaminophen] Other (See Comments)    Hallucination   . Epinephrine Other (See Comments)    Severe increase in heart rate  . Percocet [Oxycodone-Acetaminophen] Other (See Comments)    Hallucinations     Patient Measurements:   Ht and Wt are pending  Vital Signs: Temp: 101 F (38.3 C) (02/19 1118) Temp src: Oral (02/19 1118) BP: 135/55 mmHg (02/19 1317) Pulse Rate: 85 (02/19 1317) Intake/Output from previous day:   Intake/Output from this shift:    Labs:  Recent Labs  08/27/13 1150  WBC 9.1  HGB 12.7  PLT 220  CREATININE 0.87   CrCl is unknown because there is no height on file for the current visit. No results found for this basename: VANCOTROUGH, VANCOPEAK, VANCORANDOM, GENTTROUGH, GENTPEAK, GENTRANDOM, TOBRATROUGH, TOBRAPEAK, TOBRARND, AMIKACINPEAK, AMIKACINTROU, AMIKACIN,  in the last 72 hours   Microbiology: No results found for this or any previous visit (from the past 720 hour(s)).  Medical History: Past Medical History  Diagnosis Date  . MI, old     Medications:  Scheduled:   Infusions:  . sodium chloride     PRN:   Assessment: 80 y/o F presented to ED on 2/19 with abdominal pain.  CT demonstrates perforated viscus.  Orders received for empiric Zosyn and vancomycin with pharmacy dosing assistance.   Plans for exploratory laparotomy this afternoon noted.  Goal of Therapy:  1. Appropriate antibiotic dosing for renal function; eradication of infection 2. Vancomycin trough 15-20  Plan:  1. Zosyn 3.375 grams IV stat x 1 2. Vancomycin 1000 mg IV stat x 1 3. Further antibiotic orders postoperatively; should have accurate weight available by then.  Clayburn Pert, PharmD, BCPS Pager: [phone] 08/27/2013  2:36 PM

## 2013-08-27 NOTE — Anesthesia Preprocedure Evaluation (Signed)
Anesthesia Evaluation  Patient identified by MRN, date of birth, ID band Patient awake    Reviewed: Allergy & Precautions, H&P , NPO status , Patient's Chart, lab work & pertinent test results  Airway Mallampati: II TM Distance: >3 FB Neck ROM: Full    Dental no notable dental hx.    Pulmonary neg pulmonary ROS,  breath sounds clear to auscultation  Pulmonary exam normal       Cardiovascular + Past MI negative cardio ROS  + pacemaker Rhythm:Regular Rate:Normal     Neuro/Psych negative neurological ROS  negative psych ROS   GI/Hepatic negative GI ROS, Neg liver ROS,   Endo/Other  negative endocrine ROS  Renal/GU negative Renal ROS  negative genitourinary   Musculoskeletal negative musculoskeletal ROS (+)   Abdominal   Peds negative pediatric ROS (+)  Hematology negative hematology ROS (+)   Anesthesia Other Findings   Reproductive/Obstetrics negative OB ROS                           Anesthesia Physical Anesthesia Plan  ASA: III and emergent  Anesthesia Plan: General   Post-op Pain Management:    Induction: Intravenous, Rapid sequence and Cricoid pressure planned  Airway Management Planned:   Additional Equipment:   Intra-op Plan:   Post-operative Plan: Extubation in OR  Informed Consent: I have reviewed the patients History and Physical, chart, labs and discussed the procedure including the risks, benefits and alternatives for the proposed anesthesia with the patient or authorized representative who has indicated his/her understanding and acceptance.   Dental advisory given  Plan Discussed with: CRNA  Anesthesia Plan Comments:         Anesthesia Quick Evaluation

## 2013-08-27 NOTE — Anesthesia Postprocedure Evaluation (Signed)
  Anesthesia Post-op Note  Patient: Kathryn [name]  Procedure(s) Performed: Procedure(s) (LRB): LAPAROSCOPY CONVERTED TO OPEN LAPAROTOMY WITH SIGMOID COLECTOMY AND COLOSTOMY (N/A)  Patient Location: PACU  Anesthesia Type: General  Level of Consciousness: awake and alert   Airway and Oxygen Therapy: Patient Spontanous Breathing  Post-op Pain: mild  Post-op Assessment: Post-op Vital signs reviewed, Patient's Cardiovascular Status Stable, Respiratory Function Stable, Patent Airway and No signs of Nausea or vomiting  Last Vitals:  Filed Vitals:   08/27/13 2000  BP: 116/58  Pulse: 77  Temp: 36.7 C  Resp: 16    Post-op Vital Signs: stable   Complications: No apparent anesthesia complications

## 2013-08-27 NOTE — ED Notes (Signed)
Pt c/o abd cramping and nausea that started yesterday morning. Pt denies vomiting or diarrhea.

## 2013-08-27 NOTE — Progress Notes (Signed)
PHARMACY NOTE ADDENDUM:  ANTIBIOTIC DOSING  Ht/Wt data now available:  Ht 5' 4.5" Wt 58.6kg   Plan: 1. Continue Zosyn as 3.375 grams IV q8h, each dose over 4 hours (next due tonight at 8pm). 2. Continue vancomycin as 500 mg IV q12h (next due 2/20 at 2am).  Clayburn Pert, PharmD, BCPS Pager: [phone] 08/27/2013  3:42 PM

## 2013-08-28 ENCOUNTER — Encounter (HOSPITAL_COMMUNITY): Payer: Self-pay | Admitting: Surgery

## 2013-08-28 LAB — BASIC METABOLIC PANEL
BUN: 15 mg/dL (ref 6–23)
CO2: 23 mEq/L (ref 19–32)
Calcium: 8.1 mg/dL — ABNORMAL LOW (ref 8.4–10.5)
Chloride: 101 mEq/L (ref 96–112)
Creatinine, Ser: 0.9 mg/dL (ref 0.50–1.10)
GFR calc Af Amer: 67 mL/min — ABNORMAL LOW (ref >90)
GFR, EST NON AFRICAN AMERICAN: 58 mL/min — AB (ref >90)
Glucose, Bld: 131 mg/dL — ABNORMAL HIGH (ref 70–99)
POTASSIUM: 4.4 meq/L (ref 3.7–5.3)
SODIUM: 134 meq/L — AB (ref 137–147)

## 2013-08-28 LAB — MAGNESIUM: Magnesium: 1.3 mg/dL — ABNORMAL LOW (ref 1.5–2.5)

## 2013-08-28 LAB — URINE CULTURE: Colony Count: 5000

## 2013-08-28 LAB — CBC WITH DIFFERENTIAL/PLATELET
Basophils Absolute: 0 10*3/uL (ref 0.0–0.1)
Basophils Relative: 0 % (ref 0–1)
EOS ABS: 0 10*3/uL (ref 0.0–0.7)
EOS PCT: 0 % (ref 0–5)
HCT: 30.4 % — ABNORMAL LOW (ref 36.0–46.0)
HEMOGLOBIN: 10.1 g/dL — AB (ref 12.0–15.0)
LYMPHS ABS: 0.6 10*3/uL — AB (ref 0.7–4.0)
Lymphocytes Relative: 4 % — ABNORMAL LOW (ref 12–46)
MCH: 30.8 pg (ref 26.0–34.0)
MCHC: 33.2 g/dL (ref 30.0–36.0)
MCV: 92.7 fL (ref 78.0–100.0)
MONOS PCT: 6 % (ref 3–12)
Monocytes Absolute: 0.9 10*3/uL (ref 0.1–1.0)
Neutro Abs: 14.3 10*3/uL — ABNORMAL HIGH (ref 1.7–7.7)
Neutrophils Relative %: 90 % — ABNORMAL HIGH (ref 43–77)
Platelets: 172 10*3/uL (ref 150–400)
RBC: 3.28 MIL/uL — ABNORMAL LOW (ref 3.87–5.11)
RDW: 13.2 % (ref 11.5–15.5)
WBC: 15.8 10*3/uL — ABNORMAL HIGH (ref 4.0–10.5)

## 2013-08-28 MED ORDER — CHLORHEXIDINE GLUCONATE 0.12 % MT SOLN
OROMUCOSAL | Status: AC
  Administered 2013-08-28: 15 mL
  Filled 2013-08-28: qty 15

## 2013-08-28 MED ORDER — BIOTENE DRY MOUTH MT LIQD
15.0000 mL | Freq: Two times a day (BID) | OROMUCOSAL | Status: DC
  Administered 2013-08-28 – 2013-09-02 (×11): 15 mL via OROMUCOSAL

## 2013-08-28 MED ORDER — LIP MEDEX EX OINT
TOPICAL_OINTMENT | CUTANEOUS | Status: DC | PRN
  Filled 2013-08-28: qty 7

## 2013-08-28 MED ORDER — CHLORHEXIDINE GLUCONATE 0.12 % MT SOLN
15.0000 mL | Freq: Two times a day (BID) | OROMUCOSAL | Status: DC
  Administered 2013-08-28 – 2013-08-31 (×6): 15 mL via OROMUCOSAL
  Filled 2013-08-28 (×8): qty 15

## 2013-08-28 MED ORDER — VITAMINS A & D EX OINT
TOPICAL_OINTMENT | CUTANEOUS | Status: AC
  Administered 2013-08-28: 5
  Filled 2013-08-28: qty 5

## 2013-08-28 MED ORDER — HEPARIN SODIUM (PORCINE) 5000 UNIT/ML IJ SOLN
5000.0000 [IU] | Freq: Three times a day (TID) | INTRAMUSCULAR | Status: DC
  Administered 2013-08-28 – 2013-09-03 (×17): 5000 [IU] via SUBCUTANEOUS
  Filled 2013-08-28 (×22): qty 1

## 2013-08-28 NOTE — Progress Notes (Addendum)
General Surgery Note  LOS: 1 day  POD -  1 Day Post-Op PCP - Dr. Jerilynn Mages. Perini  Assessment/Plan: 1.  LAPAROSCOPY CONVERTED TO OPEN LAPAROTOMY WITH SIGMOID COLECTOMY AND COLOSTOMY - 08/27/2013 - D. Rhiannon Sassaman  For perforated sigmoid colon  Zosyn - started 2/19 >>>  Still with ileus, but will remove NGT  Will leave in step down   2. Has pacemaker  Saw Dr. Rollene Fare 3.  Hypertension 4.  DVT prophylaxis - SQ Heparin 5.  Foley removed   Active Problems:   Perforated viscus  Subjective:  Doing well.  The morphine makes her abdomen hurt.  Not sure what this means.   Objective:   Filed Vitals:   08/28/13 1200  BP:   Pulse:   Temp: 97.6 F (36.4 C)  Resp:      Intake/Output from previous day:  02/19 0701 - 02/20 0700 In: 4318.8 [I.V.:4118.8; IV Piggyback:200] Out: 680 [Urine:490; Emesis/NG output:140; Blood:50]  Intake/Output this shift:  Total I/O In: 250 [I.V.:250] Out: -    Physical Exam:   General: WN Older WF who is alert and oriented.    HEENT: Normal. Pupils equal. .   Lungs: Cleaqr   Abdomen: Soft, quiet.   Wound: Open wound dressed.  Ostomy LLQ okay   Lab Results:    Recent Labs  08/27/13 1150 08/28/13 0320  WBC 9.1 15.8*  HGB 12.7 10.1*  HCT 38.1 30.4*  PLT 220 172    BMET   Recent Labs  08/27/13 1150 08/28/13 0320  NA 137 134*  K 3.7 4.4  CL 98 101  CO2 23 23  GLUCOSE 131* 131*  BUN 17 15  CREATININE 0.87 0.90  CALCIUM 10.3 8.1*    PT/INR   Recent Labs  08/27/13 1400  LABPROT 15.3*  INR 1.24    ABG  No results found for this basename: PHART, PCO2, PO2, HCO3,  in the last 72 hours   Studies/Results:  Ct Abdomen Pelvis W Contrast  08/27/2013   CLINICAL DATA:  Abdominal cramping and nausea beginning 1 day ago, diffuse abdominal pain, fever, diarrhea  EXAM: CT ABDOMEN AND PELVIS WITH CONTRAST  TECHNIQUE: Multidetector CT imaging of the abdomen and pelvis was performed using the standard protocol following bolus administration of  intravenous contrast.  CONTRAST:  23mL OMNIPAQUE IOHEXOL 300 MG/ML SOLN ORALLY, 160mL OMNIPAQUE IOHEXOL 300 MG/ML SOLN IV  COMPARISON:  08/16/2009  FINDINGS: Capsular calcification at bilateral breast prostheses.  Pacemaker lead right ventricle.  Minimal bibasilar atelectasis.  Post cholecystectomy.  Low-attenuation lesion again identified at the lateral segment left lobe liver, 2.5 x 2.5 cm showing fill-in on delayed images question hemangioma; prior multi phase CT imaging of the abdomen showed caries should sixth of a hepatic hemangioma as well.  Remainder of liver, spleen, pancreas, kidneys, and adrenal glands normal.  Scattered foci of free intraperitoneal air in upper abdomen and foci of extraluminal gas within the mesentery consistent with perforated viscus.  Free intraperitoneal fluid perihepatic and in pelvis ; no extravasated GI contrast identified.  Several small bowel loops in the mid abdomen and pelvis demonstrate mild diffuse wall thickening consistent with an enteritis.  Several of these loops show hyper enhancement of the bowel wall suggesting inflammation.  Small intraluminal nodular focus identified within a small bowel loop question retained GI contrast, thickened fold or nodule axial image 34, coronal image 23.  Stomach decompressed.  Minimal distal it sigmoid diverticulosis without definite evidence of diverticulitis.  Remainder of colon unremarkable.  No  additional mass, adenopathy, or hernia.  Scattered atherosclerotic calcifications.  Bones unremarkable.  IMPRESSION: Free air and free fluid consistent with perforated viscus.  Multiple small bowel loops demonstrate mild scattered wall thickening and minimal wall hyperenhancement consistent with an enteritis,, differential diagnosis including infection and Crohn's disease, ischemia less likely but not completely excluded.  Minimal sigmoid diverticulosis without evidence of diverticulitis.  Question retained contrast versus a thickened fold or  tiny nodule or polyp within a small bowel loop in the right mid abdomen.   Electronically Signed   By: Lavonia Dana M.D.   On: 08/27/2013 13:38   Dg Chest Port 1 View  08/27/2013   CLINICAL DATA:  Preoperative for colonic surgery  EXAM: PORTABLE CHEST - 1 VIEW  COMPARISON:  PA and lateral chest x-ray of September 28, 2009  FINDINGS: There is mild hyperinflation consistent with underlying COPD. There is no alveolar pneumonia. The interstitial markings are mildly prominent but not clearly new. The cardiac silhouette is top-normal in size. The pulmonary vascularity is not engorged. A permanent pacemaker is in place.  IMPRESSION: There is no evidence of active cardiopulmonary disease. There is likely underlying COPD.   Electronically Signed   By: Tereza Gilham  [place]   On: 08/27/2013 14:58     Anti-infectives:   Anti-infectives   Start     Dose/Rate Route Frequency Ordered Stop   08/28/13 0200  vancomycin (VANCOCIN) 500 mg in sodium chloride 0.9 % 100 mL IVPB  Status:  Discontinued     500 mg 100 mL/hr over 60 Minutes Intravenous Every 12 hours 08/27/13 1539 08/27/13 2023   08/28/13 0200  vancomycin (VANCOCIN) 500 mg in sodium chloride 0.9 % 100 mL IVPB     500 mg 100 mL/hr over 60 Minutes Intravenous Every 12 hours 08/27/13 2026     08/27/13 2000  piperacillin-tazobactam (ZOSYN) IVPB 3.375 g     3.375 g 12.5 mL/hr over 240 Minutes Intravenous 3 times per day 08/27/13 1540     08/27/13 1230  piperacillin-tazobactam (ZOSYN) IVPB 3.375 g     3.375 g 100 mL/hr over 30 Minutes Intravenous STAT 08/27/13 1224 08/27/13 1333   08/27/13 1230  vancomycin (VANCOCIN) IVPB 1000 mg/200 mL premix     1,000 mg 200 mL/hr over 60 Minutes Intravenous STAT 08/27/13 1224 08/27/13 1500      Alphonsa Overall, MD, FACS Pager: [facility] Office: [phone] 08/28/2013

## 2013-08-28 NOTE — Progress Notes (Signed)
CARE MANAGEMENT NOTE 08/28/2013  Patient:  Kathryn [name], Kathryn [name]   Account Number:  [id number]  Date Initiated:  08/28/2013  Documentation initiated by:  DAVIS,RHONDA  Subjective/Objective Assessment:   elderly female with bowel perforation requiring surgical intervention and formation of colostomy/post op anuria/placed in icu for fld challenge and major surg. post op care     Action/Plan:   tbd/pt is from home and has been indep. in her ald's lives with spouse   Anticipated DC Date:  08/31/2013   Anticipated DC Plan:  HOME/SELF CARE  In-house referral  NA      DC Planning Services  NA      [facility] Choice  NA   Choice offered to / List presented to:  NA   DME arranged  NA      DME agency  NA     [place] arranged  NA      [place] agency  NA   Status of service:  In process, will continue to follow Medicare Important Message given?  NA - LOS <3 / Initial given by admissions (If response is "NO", the following Medicare IM given date fields will be blank) Date Medicare IM given:   Date Additional Medicare IM given:    Discharge Disposition:    Per UR Regulation:  Reviewed for med. necessity/level of care/duration of stay  If discussed at [place] of Stay Meetings, dates discussed:    Comments:  [id number]/Rhonda Eldridge Dace, BSN, [place] [phone] Chart Reviewed for discharge and hospital needs. Discharge needs at time of review:  None present will follow for needs. Review of patient progress due on [id number].

## 2013-08-28 NOTE — Op Note (Signed)
NAMETERI, LEGACY NO.:  [id number]  MEDICAL RECORD NO.:  [id number]  LOCATION:  2952                         FACILITY:  [facility]  PHYSICIAN:  Fenton Malling. Lucia Gaskins, M.D.  DATE OF BIRTH:  July 14, 1927  DATE OF PROCEDURE:  08/27/2013                              OPERATIVE REPORT   PREOPERATIVE DIAGNOSIS:  Pneumoperitoneum probable perforated viscus.  POSTOPERATIVE DIAGNOSIS:  Pneumoperitoneum perforated sigmoid colon.  PROCEDURE:  Laparoscopic converted to open laparotomy, sigmoid colectomy, end colostomy and Hartmann's pouch.  SURGEON:  Fenton Malling. Lucia Gaskins, M.D.  FIRST ASSISTANT:  Sammuel Hines. Daiva Nakayama, MD  ANESTHESIA:  General endotracheal.  ESTIMATED BLOOD LOSS:  100 mL.  DRAINS:  Drains left in were none.  INDICATION FOR PROCEDURE:  [name] is a 80 year old white female, who is a patient of Dr. Crist Infante, who presents with a 24-hour history of acute onset of abdominal pain.  A CT scan revealed pneumoperitoneum of unknown etiology.  Discussion carried out with the patient about proceeding with laparoscopy/laparotomy.  The indication and potential risks of surgery were explained to the patient.  Potential risks include, but are not limited to, bleeding, open surgery, colostomy, and other medical postop complications.  OPERATIVE NOTE:  The patient taken to room #1 at [facility], underwent a general endotracheal anesthetic, supervised by Dr. Montez Hageman.  She had both her arms tucked in a supine position.  She was on Zosyn as a antibiotic.    A time-out was held and surgical checklist run.  I went through an infraumbilical incision with sharp dissection carried into the abdominal cavity. I placed a 12-mm Hassan. I secured the [facility] trocar with 0 Vicryl suture.  I placed one additional 5 mm port in the left upper quadrant.  I used a 30 degree 5 mm laparoscope to do an exploration.  Her upper abdomen had evidence of purulence over the liver, but there is no  obvious perforation.   In looking towards her pelvis she had extensive purulence in her pelvis with small bowel loops trapped.  I thought the source of her perforation was her lower abdomen.  I then converted to an open operation.  I made a lower midline laparotomy below the umbilicus, approximately 12 cm long and got into the abdominal cavity.  I obtained cultures of the peritoneal fluid.  She had a lot of purulence in the abdomen, I ran the small bowel from terminal ileum to ligament Treitz.  The terminal ileum which was unremarkable.  I examined her colon and in the sigmoid colon she had a distal sigmoid colon perforation which looked like a ulcer or a bolus of stool with a perforated side wall of the colon.  There was no tumor and this did not look like a typical diverticular perforation.  There was fecal contamination in addition to the purulence.  I resected a segment of her sigmoid colon about 8 inches in length.  I used a 43 GIA stapler for the proximal resection and a Contour stapler (blue load) for the distal resection.  I used the Ligasure to divide the mesentery.  I did not get into the retroperitoneum. I went approximately 3-4 cm  beyond the perforation to resect the sigmoid colon.  I put a long suture in the proximal end and sent this to Pathology.  I then placed 2 Prolene sutures on the distal sigmoid colon stump which was about 10-12 cm from the peritoneal reflection.  I then irrigated the abdomen out with a several liter of saline, irrigating the pelvis out with a 4 L, the right upper quadrant with 1 L, the left lower quadrant with 2 L.  I then mobilized the sigmoid colon to bring up for a colostomy in the left lower quadrant.  I went through the middle of the rectus muscle, and cut out a piece of skin and brought the bowel up to the abdominal wall.  I sewed this in place with a 3-0 Vicryl suture x2.  She had a very redundant transverse colon.  I brought the omentum down to the midline, first  I closed the peritoneum with a running 2-0 Vicryl suture. I closed the fascia with 2 running #1 Novafil sutures and put in at least 2 interrupted #1 Novafil sutures in the wound.  Because of the degree of contamination,  I left the wound packed open.  This is a class 4 wound.   I then matured the colostomy with 3-0 Vicryl sutures, creating an colostomy about 1-1/2 or 1-3/4 inches.  I put a finger in, this showed a widely patent lumen.  I placed a saline gauze in the midline wound, put a colostomy bag on and the patient was transferred to recovery room in good condition.  The sponge and needle count were correct at the end of the case.  She will be moved to the step-down ICU.   Fenton Malling. Lucia Gaskins, M.D., FACS   DHN/MEDQ  D:  08/27/2013  T:  08/28/2013  Job:  [id number]  cc:   Elta [place] A. Perini, M.D. Fax: [phone]

## 2013-08-29 LAB — BASIC METABOLIC PANEL
BUN: 21 mg/dL (ref 6–23)
CO2: 22 mEq/L (ref 19–32)
Calcium: 8.4 mg/dL (ref 8.4–10.5)
Chloride: 108 mEq/L (ref 96–112)
Creatinine, Ser: 1.02 mg/dL (ref 0.50–1.10)
GFR calc Af Amer: 57 mL/min — ABNORMAL LOW (ref >90)
GFR calc non Af Amer: 49 mL/min — ABNORMAL LOW (ref >90)
Glucose, Bld: 80 mg/dL (ref 70–99)
Potassium: 4.7 mEq/L (ref 3.7–5.3)
Sodium: 140 mEq/L (ref 137–147)

## 2013-08-29 LAB — CBC WITH DIFFERENTIAL/PLATELET
BASOS ABS: 0 10*3/uL (ref 0.0–0.1)
BASOS PCT: 0 % (ref 0–1)
EOS ABS: 0 10*3/uL (ref 0.0–0.7)
EOS PCT: 0 % (ref 0–5)
HEMATOCRIT: 27.2 % — AB (ref 36.0–46.0)
Hemoglobin: 8.6 g/dL — ABNORMAL LOW (ref 12.0–15.0)
LYMPHS PCT: 6 % — AB (ref 12–46)
Lymphs Abs: 0.7 10*3/uL (ref 0.7–4.0)
MCH: 29.9 pg (ref 26.0–34.0)
MCHC: 31.6 g/dL (ref 30.0–36.0)
MCV: 94.4 fL (ref 78.0–100.0)
MONO ABS: 0.8 10*3/uL (ref 0.1–1.0)
Monocytes Relative: 6 % (ref 3–12)
Neutro Abs: 11.5 10*3/uL — ABNORMAL HIGH (ref 1.7–7.7)
Neutrophils Relative %: 88 % — ABNORMAL HIGH (ref 43–77)
Platelets: 173 10*3/uL (ref 150–400)
RBC: 2.88 MIL/uL — ABNORMAL LOW (ref 3.87–5.11)
RDW: 13.6 % (ref 11.5–15.5)
WBC: 13.1 10*3/uL — ABNORMAL HIGH (ref 4.0–10.5)

## 2013-08-29 LAB — VANCOMYCIN, TROUGH: Vancomycin Tr: 10.2 ug/mL (ref 10.0–20.0)

## 2013-08-29 MED ORDER — VANCOMYCIN HCL IN DEXTROSE 750-5 MG/150ML-% IV SOLN
750.0000 mg | Freq: Two times a day (BID) | INTRAVENOUS | Status: DC
  Filled 2013-08-29 (×2): qty 150

## 2013-08-29 MED ORDER — KCL IN DEXTROSE-NACL 30-5-0.45 MEQ/L-%-% IV SOLN
INTRAVENOUS | Status: DC
  Administered 2013-08-29 – 2013-08-31 (×4): via INTRAVENOUS
  Filled 2013-08-29 (×6): qty 1000

## 2013-08-29 NOTE — Progress Notes (Signed)
ANTIBIOTIC CONSULT NOTE - FOLLOW UP  Pharmacy Consult for Vancomycin and Zosyn Indication: Sepsis, perforated viscous  Allergies  Allergen Reactions  . Darvocet [Propoxyphene N-Acetaminophen] Other (See Comments)    Hallucination   . Epinephrine Other (See Comments)    Severe increase in heart rate  . Percocet [Oxycodone-Acetaminophen] Other (See Comments)    Hallucinations     Patient Measurements: Height: 5' 4.5" (163.8 cm) Weight: 144 lb 6.4 oz (65.5 kg) IBW/kg (Calculated) : 55.85  Vital Signs: Temp: 98 F (36.7 C) (02/21 1200) Temp src: Oral (02/21 1200) BP: 165/65 mmHg (02/21 1505) Pulse Rate: 97 (02/21 1505) Intake/Output from previous day: 02/20 0701 - 02/21 0700 In: 2850 [I.V.:2500; IV Piggyback:350] Out: 370 [Urine:370] Intake/Output from this shift: Total I/O In: 1155.8 [P.O.:240; I.V.:865.8; IV Piggyback:50] Out: 350 [Urine:350]  Labs:  Recent Labs  08/27/13 1150 08/28/13 0320 08/29/13 0323  WBC 9.1 15.8* 13.1*  HGB 12.7 10.1* 8.6*  PLT 220 172 173  CREATININE 0.87 0.90 1.02   Estimated Creatinine Clearance: 36.9 ml/min (by C-G formula based on Cr of 1.02).  Recent Labs  08/29/13 1523  [place] 10.2     Microbiology: Recent Results (from the past 720 hour(s))  URINE CULTURE     Status: None   Collection Time    08/27/13  2:50 PM      Result Value Ref Range Status   Specimen Description URINE, RANDOM   Final   Special Requests NONE   Final   Culture  Setup Time     Final   Value: 08/27/2013 16:36     Performed at [place]     Final   Value: 5,000 COLONIES/ML     Performed at [facility]   Culture     Final   Value: INSIGNIFICANT GROWTH     Performed at [facility]   Report Status 08/28/2013 FINAL   Final  ANAEROBIC CULTURE     Status: None   Collection Time    08/27/13  5:25 PM      Result Value Ref Range Status   Specimen Description PERITONEAL   Final   Special Requests PATIENT  ON FOLLOWING ZOSYN   Final   Gram Stain     Final   Value: NO WBC SEEN     NO ORGANISMS SEEN     Performed at [facility]   Culture     Final   Value: NO ANAEROBES ISOLATED; CULTURE IN PROGRESS FOR 5 DAYS     Performed at [facility]   Report Status PENDING   Incomplete  BODY FLUID CULTURE     Status: None   Collection Time    08/27/13  5:25 PM      Result Value Ref Range Status   Specimen Description PERITONEAL   Final   Special Requests PATIENT ON FOLLOWING ZOSYN   Final   Gram Stain     Final   Value: NO WBC SEEN     NO ORGANISMS SEEN     Performed at [facility]   Culture     Final   Value: NO GROWTH 2 DAYS     Performed at [facility]   Report Status PENDING   Incomplete   Anti-infectives: 2/19 >>Zosyn >> 2/19 >>Vancomycin >>   Assessment: 80 yo female presented 2/19 with abdominal pain. CT demonstrates perforated viscus. S/p Laparoscopic converted to open laparaotomy w/ sigmoid colectomy and colostomy.  Day #3 Vancomycin 1g (2/19), then 500mg  q12h and Zosyn 3.375g q8h  Vancomycin trough subtherapeutic (10.2) before 4th dose of 500mg  q12h  SCr slightly increased (0.87 >  0.9 > 1.02), UOP 0.6 ml/kg/hr  Afebrile  WBC remain elevated at 13.1k  Peritoneal cultures ngtd  Goal of Therapy:  Vancomycin trough level 15-20 mcg/ml Zosyn dose per renal function  Plan:   Increase Vancomycin 750mg  IV q12h, re-check trough at steady state if continues  Continue Zosyn 3.375gm IV q8h (4hr extended infusions)  F/u plans for narrowing abx  Peggyann Juba, PharmD, BCPS Pager: [phone] 08/29/2013,4:15 PM

## 2013-08-29 NOTE — Progress Notes (Signed)
Patient ID: Kathryn [name], female   DOB: [birth year], 80 y.o.   MRN: [id number]  General Surgery - [facility] - Progress Note  POD# 2  Subjective: Patient in bed, awake and alert.  No nausea.  No complaints.  Objective: Vital signs in last 24 hours: Temp:  [97.6 F (36.4 C)-98.6 F (37 C)] 98.6 F (37 C) (02/21 0400) Pulse Rate:  [69-96] 78 (02/21 0700) Resp:  [11-25] 15 (02/21 0700) BP: (92-146)/(32-97) 130/45 mmHg (02/21 0400) SpO2:  [91 %-100 %] 100 % (02/21 0700) Weight:  [144 lb 6.4 oz (65.5 kg)] 144 lb 6.4 oz (65.5 kg) (02/21 0400)    Intake/Output from previous day: 02/20 0701 - 02/21 0700 In: 2850 [I.V.:2500; IV Piggyback:350] Out: 370 [Urine:370]  Exam: HEENT - clear, not icteric Neck - soft Chest - clear bilaterally Cor - RRR, no murmur Abd - soft, mild distension; BS present; stoma viable; dressing intact Ext - no significant edema Neuro - grossly intact, no focal deficits  Lab Results:   Recent Labs  08/28/13 0320 08/29/13 0323  WBC 15.8* 13.1*  HGB 10.1* 8.6*  HCT 30.4* 27.2*  PLT 172 173     Recent Labs  08/28/13 0320 08/29/13 0323  NA 134* 140  K 4.4 4.7  CL 101 108  CO2 23 22  GLUCOSE 131* 80  BUN 15 21  CREATININE 0.90 1.02  CALCIUM 8.1* 8.4    Studies/Results: Ct Abdomen Pelvis W Contrast  08/27/2013   CLINICAL DATA:  Abdominal cramping and nausea beginning 1 day ago, diffuse abdominal pain, fever, diarrhea  EXAM: CT ABDOMEN AND PELVIS WITH CONTRAST  TECHNIQUE: Multidetector CT imaging of the abdomen and pelvis was performed using the standard protocol following bolus administration of intravenous contrast.  CONTRAST:  69mL OMNIPAQUE IOHEXOL 300 MG/ML SOLN ORALLY, 164mL OMNIPAQUE IOHEXOL 300 MG/ML SOLN IV  COMPARISON:  08/16/2009  FINDINGS: Capsular calcification at bilateral breast prostheses.  Pacemaker lead right ventricle.  Minimal bibasilar atelectasis.  Post cholecystectomy.  Low-attenuation lesion again  identified at the lateral segment left lobe liver, 2.5 x 2.5 cm showing fill-in on delayed images question hemangioma; prior multi phase CT imaging of the abdomen showed caries should sixth of a hepatic hemangioma as well.  Remainder of liver, spleen, pancreas, kidneys, and adrenal glands normal.  Scattered foci of free intraperitoneal air in upper abdomen and foci of extraluminal gas within the mesentery consistent with perforated viscus.  Free intraperitoneal fluid perihepatic and in pelvis ; no extravasated GI contrast identified.  Several small bowel loops in the mid abdomen and pelvis demonstrate mild diffuse wall thickening consistent with an enteritis.  Several of these loops show hyper enhancement of the bowel wall suggesting inflammation.  Small intraluminal nodular focus identified within a small bowel loop question retained GI contrast, thickened fold or nodule axial image 34, coronal image 23.  Stomach decompressed.  Minimal distal it sigmoid diverticulosis without definite evidence of diverticulitis.  Remainder of colon unremarkable.  No additional mass, adenopathy, or hernia.  Scattered atherosclerotic calcifications.  Bones unremarkable.  IMPRESSION: Free air and free fluid consistent with perforated viscus.  Multiple small bowel loops demonstrate mild scattered wall thickening and minimal wall hyperenhancement consistent with an enteritis,, differential diagnosis including infection and Crohn's disease, ischemia less likely but not completely excluded.  Minimal sigmoid diverticulosis without evidence of diverticulitis.  Question retained contrast versus a thickened fold or tiny nodule or polyp within a small bowel loop in the right mid abdomen.  Electronically Signed   By: Lavonia Dana M.D.   On: 08/27/2013 13:38   Dg Chest Port 1 View  08/27/2013   CLINICAL DATA:  Preoperative for colonic surgery  EXAM: PORTABLE CHEST - 1 VIEW  COMPARISON:  PA and lateral chest x-ray of September 28, 2009  FINDINGS:  There is mild hyperinflation consistent with underlying COPD. There is no alveolar pneumonia. The interstitial markings are mildly prominent but not clearly new. The cardiac silhouette is top-normal in size. The pulmonary vascularity is not engorged. A permanent pacemaker is in place.  IMPRESSION: There is no evidence of active cardiopulmonary disease. There is likely underlying COPD.   Electronically Signed   By: David  [place]   On: 08/27/2013 14:58    Assessment / Plan: 1. LAPAROSCOPY CONVERTED TO OPEN LAPAROTOMY WITH SIGMOID COLECTOMY AND COLOSTOMY - 08/27/2013 - Dr. Alphonsa Overall - for perforated sigmoid colon   Zosyn - started 2/19   Will leave in step down  Allow sips clear liquids - no trays  OOB, ambulate 2. Has pacemaker  Dr. Rollene Fare  3. Hypertension  4. DVT prophylaxis - SQ Heparin    Earnstine Regal, MD, [facility] Office: [phone]  08/29/2013

## 2013-08-30 LAB — BASIC METABOLIC PANEL
BUN: 13 mg/dL (ref 6–23)
CALCIUM: 8.9 mg/dL (ref 8.4–10.5)
CO2: 23 meq/L (ref 19–32)
CREATININE: 0.91 mg/dL (ref 0.50–1.10)
Chloride: 107 mEq/L (ref 96–112)
GFR calc Af Amer: 66 mL/min — ABNORMAL LOW (ref >90)
GFR calc non Af Amer: 57 mL/min — ABNORMAL LOW (ref >90)
Glucose, Bld: 129 mg/dL — ABNORMAL HIGH (ref 70–99)
Potassium: 4.6 mEq/L (ref 3.7–5.3)
Sodium: 139 mEq/L (ref 137–147)

## 2013-08-30 LAB — CBC
HCT: 27.7 % — ABNORMAL LOW (ref 36.0–46.0)
Hemoglobin: 9.1 g/dL — ABNORMAL LOW (ref 12.0–15.0)
MCH: 30.5 pg (ref 26.0–34.0)
MCHC: 32.9 g/dL (ref 30.0–36.0)
MCV: 93 fL (ref 78.0–100.0)
Platelets: 207 10*3/uL (ref 150–400)
RBC: 2.98 MIL/uL — ABNORMAL LOW (ref 3.87–5.11)
RDW: 13.4 % (ref 11.5–15.5)
WBC: 10.8 10*3/uL — AB (ref 4.0–10.5)

## 2013-08-30 LAB — BODY FLUID CULTURE
CULTURE: NO GROWTH
Gram Stain: NONE SEEN

## 2013-08-30 MED ORDER — NALOXONE HCL 0.4 MG/ML IJ SOLN: 0.4000 mg | INTRAMUSCULAR | Status: DC | PRN

## 2013-08-30 MED ORDER — ONDANSETRON HCL 4 MG/2ML IJ SOLN
4.0000 mg | Freq: Four times a day (QID) | INTRAMUSCULAR | Status: DC | PRN
  Administered 2013-08-31: 4 mg via INTRAVENOUS
  Filled 2013-08-30: qty 2

## 2013-08-30 MED ORDER — SALINE SPRAY 0.65 % NA SOLN
1.0000 | NASAL | Status: DC | PRN
  Filled 2013-08-30: qty 44

## 2013-08-30 MED ORDER — DIPHENHYDRAMINE HCL 50 MG/ML IJ SOLN: 12.5000 mg | Freq: Four times a day (QID) | INTRAMUSCULAR | Status: DC | PRN

## 2013-08-30 MED ORDER — HYDROMORPHONE 0.3 MG/ML IV SOLN
INTRAVENOUS | Status: DC
  Administered 2013-08-30: 1.39 mg via INTRAVENOUS
  Administered 2013-08-30: 0.4 mg via INTRAVENOUS
  Administered 2013-08-30: 12:00:00 via INTRAVENOUS
  Administered 2013-08-31: 0.6 mg via INTRAVENOUS
  Administered 2013-08-31: 0.3999 mg via INTRAVENOUS
  Filled 2013-08-30: qty 25

## 2013-08-30 MED ORDER — SODIUM CHLORIDE 0.9 % IJ SOLN: 9.0000 mL | INTRAMUSCULAR | Status: DC | PRN

## 2013-08-30 MED ORDER — DIPHENHYDRAMINE HCL 12.5 MG/5ML PO ELIX: 12.5000 mg | ORAL_SOLUTION | Freq: Four times a day (QID) | ORAL | Status: DC | PRN

## 2013-08-30 NOTE — Progress Notes (Signed)
Patient ID: Kathryn [name], female   DOB: Mar 04, 1928, 80 y.o.   MRN: [id number]  General Surgery - [facility] - Progress Note  POD# 3  Subjective: Patient with pain this AM.  Has not been out of bed or used IS.  Objective: Vital signs in last 24 hours: Temp:  [97.2 F (36.2 C)-98.9 F (37.2 C)] 97.2 F (36.2 C) (02/22 0400) Pulse Rate:  [75-103] 82 (02/22 0800) Resp:  [12-26] 15 (02/22 0800) BP: (129-185)/(61-72) 166/66 mmHg (02/22 0800) SpO2:  [90 %-100 %] 98 % (02/22 0800) Weight:  [148 lb 13 oz (67.5 kg)] 148 lb 13 oz (67.5 kg) (02/22 0400)    Intake/Output from previous day: 02/21 0701 - 02/22 0700 In: 2430.8 [P.O.:240; I.V.:2065.8; IV Piggyback:125] Out: 975 [Urine:975]  Exam: HEENT - clear, not icteric Neck - soft Chest - clear bilaterally Cor - RRR, no murmur Abd - soft, moderate distension; rare BS present; dressing intact; stoma viable - no output Ext - no significant edema Neuro - grossly intact, no focal deficits  Lab Results:   Recent Labs  08/29/13 0323 08/30/13 0830  WBC 13.1* 10.8*  HGB 8.6* 9.1*  HCT 27.2* 27.7*  PLT 173 207     Recent Labs  08/29/13 0323 08/30/13 0830  NA 140 139  K 4.7 4.6  CL 108 107  CO2 22 23  GLUCOSE 80 129*  BUN 21 13  CREATININE 1.02 0.91  CALCIUM 8.4 8.9    Studies/Results: No results found.  Assessment / Plan: 1. LAPAROSCOPY CONVERTED TO OPEN LAPAROTOMY WITH SIGMOID COLECTOMY AND COLOSTOMY - 08/27/2013 - Dr. Alphonsa Overall - for perforated sigmoid colon   Zosyn - started 2/19   Will leave in step down   Allow sips clear liquids - no trays   OOB, ambulate   Change pain Rx to PCA dilaudid 2. Has pacemaker   Dr. Rollene Fare  3. Hypertension  4. DVT prophylaxis - SQ Heparin    Earnstine Regal, MD, [facility] Office: [phone]  08/30/2013

## 2013-08-31 ENCOUNTER — Encounter (HOSPITAL_COMMUNITY): Payer: Self-pay | Admitting: *Deleted

## 2013-08-31 MED ORDER — IRBESARTAN 75 MG PO TABS
75.0000 mg | ORAL_TABLET | Freq: Every day | ORAL | Status: DC
  Administered 2013-08-31 – 2013-09-03 (×4): 75 mg via ORAL
  Filled 2013-08-31 (×5): qty 1

## 2013-08-31 MED ORDER — METOPROLOL TARTRATE 25 MG PO TABS
25.0000 mg | ORAL_TABLET | Freq: Two times a day (BID) | ORAL | Status: DC
  Administered 2013-08-31 – 2013-09-03 (×6): 25 mg via ORAL
  Filled 2013-08-31 (×7): qty 1

## 2013-08-31 MED ORDER — ACETAMINOPHEN 325 MG PO TABS
650.0000 mg | ORAL_TABLET | Freq: Four times a day (QID) | ORAL | Status: DC | PRN
  Administered 2013-08-31: 650 mg via ORAL
  Filled 2013-08-31: qty 2

## 2013-08-31 MED ORDER — HYDROMORPHONE HCL PF 1 MG/ML IJ SOLN
0.5000 mg | INTRAMUSCULAR | Status: DC | PRN
  Administered 2013-08-31 – 2013-09-03 (×5): 0.5 mg via INTRAVENOUS
  Filled 2013-08-31 (×5): qty 1

## 2013-08-31 MED ORDER — ENSURE COMPLETE PO LIQD
237.0000 mL | Freq: Three times a day (TID) | ORAL | Status: DC
  Administered 2013-09-01 – 2013-09-02 (×5): 237 mL via ORAL

## 2013-08-31 NOTE — Progress Notes (Signed)
PT Cancellation Note  Patient Details Name: Kathryn [name] MRN: [id number] DOB: [birth year]   Cancelled Treatment:    Reason Eval/Treat Not Completed: Medical issues which prohibited therapy (pt dizzy, declines back to bed or amb at this time) Will see tomorrow of as schedule permits   [facility] 08/31/2013, 4:55 PM

## 2013-08-31 NOTE — Progress Notes (Signed)
Patient ID: Kathryn [name], female   DOB: Sep 26, 1927, 80 y.o.   MRN: [id number]  General Surgery - [facility] - Progress Note  POD# 4  Subjective: Patient with little pain this AM.  Has not been out of bed due to dizziness.  Objective: Vital signs in last 24 hours: Temp:  [98 F (36.7 C)-98.6 F (37 C)] 98.6 F (37 C) (02/23 0400) Pulse Rate:  [74-98] 83 (02/23 0746) Resp:  [12-20] 18 (02/23 0800) BP: (142-168)/(60-70) 168/60 mmHg (02/23 0746) SpO2:  [92 %-98 %] 98 % (02/23 0800) Last BM Date: 08/27/13 (small bloody stool in ostomy.)  Intake/Output from previous day: 02/22 0701 - 02/23 0700 In: 1559.3 [I.V.:1434.3; IV Piggyback:125] Out: 1200 [Urine:1200]  Exam: HEENT - clear, not icteric Neck - soft Chest - clear bilaterally Cor - RRR, no murmur Abd - soft, moderate distension; rare BS present; dressing intact; stoma viable - small amt of liquid stool in bag Ext - no significant edema Neuro - grossly intact, no focal deficits  Lab Results:   Recent Labs  08/29/13 0323 08/30/13 0830  WBC 13.1* 10.8*  HGB 8.6* 9.1*  HCT 27.2* 27.7*  PLT 173 207     Recent Labs  08/29/13 0323 08/30/13 0830  NA 140 139  K 4.7 4.6  CL 108 107  CO2 22 23  GLUCOSE 80 129*  BUN 21 13  CREATININE 1.02 0.91  CALCIUM 8.4 8.9    Studies/Results: No results found.  Assessment / Plan: 1. LAPAROSCOPY CONVERTED TO OPEN LAPAROTOMY WITH SIGMOID COLECTOMY AND COLOSTOMY - 08/27/2013 - Dr. Alphonsa Overall - for perforated sigmoid colon   Zosyn - started 2/19   Will xfer to tele bed  Full liquids today  OOB, ambulate, PT/OT consults ordered  Change pain Rx to tylenol PRN 2. Has pacemaker   Dr. Rollene Fare  3. Hypertension  4. DVT prophylaxis - SQ Heparin    Rosario Adie, MD  Colorectal and General Surgery [facility]   08/31/2013

## 2013-08-31 NOTE — Progress Notes (Signed)
CARE MANAGEMENT NOTE 08/31/2013  Patient:  Kathryn [name], Kathryn [name]   Account Number:  [id number]  Date Initiated:  08/28/2013  Documentation initiated by:  Jayliana Valencia  Subjective/Objective Assessment:   elderly female with bowel perforation requiring surgical intervention and formation of colostomy/post op anuria/placed in icu for fld challenge and major surg. post op care     Action/Plan:   tbd/pt is from home and has been indep. in her ald's lives with spouse   Anticipated DC Date:  09/03/2013   Anticipated DC Plan:  HOME/SELF CARE  In-house referral  NA      DC Planning Services  NA      [facility] Choice  NA   Choice offered to / List presented to:  NA   DME arranged  NA      DME agency  NA     [place] arranged  NA      [place] agency  NA   Status of service:  In process, will continue to follow Medicare Important Message given?  NA - LOS <3 / Initial given by admissions (If response is "NO", the following Medicare IM given date fields will be blank) Date Medicare IM given:   Date Additional Medicare IM given:    Discharge Disposition:    Per UR Regulation:  Reviewed for med. necessity/level of care/duration of stay  If discussed at [place] of Stay Meetings, dates discussed:    Comments:  [id number]/Kathryn Madura Rosana Hoes RN, BSN, [place], [phone] Chart reviewed for update of needs and condition. patient has orders to advance diet and to be moved from sdu to tele bed. next review [id number]  [id number]/Kathryn [name], BSN, [place] [phone] Chart Reviewed for discharge and hospital needs. Discharge needs at time of review:  None present will follow for needs. Review of patient progress due on [id number].

## 2013-09-01 ENCOUNTER — Ambulatory Visit: Payer: Medicare Other | Admitting: Cardiovascular Disease

## 2013-09-01 LAB — ANAEROBIC CULTURE: Gram Stain: NONE SEEN

## 2013-09-01 MED ORDER — TRAMADOL HCL 50 MG PO TABS
50.0000 mg | ORAL_TABLET | Freq: Four times a day (QID) | ORAL | Status: DC | PRN
  Administered 2013-09-01 – 2013-09-02 (×2): 50 mg via ORAL
  Filled 2013-09-01 (×2): qty 1

## 2013-09-01 MED ORDER — ACETAMINOPHEN 500 MG PO TABS
1000.0000 mg | ORAL_TABLET | Freq: Three times a day (TID) | ORAL | Status: DC
  Administered 2013-09-01 – 2013-09-03 (×7): 1000 mg via ORAL
  Filled 2013-09-01 (×9): qty 2

## 2013-09-01 NOTE — Progress Notes (Signed)
ANTIBIOTIC CONSULT NOTE - FOLLOW UP  Pharmacy Consult for Zosyn Indication: Sepsis, perforated viscous  Allergies  Allergen Reactions  . Darvocet [Propoxyphene N-Acetaminophen] Other (See Comments)    Hallucination   . Epinephrine Other (See Comments)    Severe increase in heart rate  . Percocet [Oxycodone-Acetaminophen] Other (See Comments)    Hallucinations     Patient Measurements: Height: 5' 4.5" (163.8 cm) Weight: 143 lb 12.8 oz (65.227 kg) IBW/kg (Calculated) : 55.85  Vital Signs: Temp: 98 F (36.7 C) (02/24 1143) Temp src: Oral (02/24 1143) BP: 143/77 mmHg (02/24 1143) Pulse Rate: 79 (02/24 1143) Intake/Output from previous day: 02/23 0701 - 02/24 0700 In: 1413.3 [P.O.:600; I.V.:663.3; IV Piggyback:150] Out: 1025 [Urine:1025] Intake/Output from this shift: Total I/O In: 410 [P.O.:360; I.V.:50] Out: 200 [Urine:200]  Labs:  Recent Labs  08/30/13 0830  WBC 10.8*  HGB 9.1*  PLT 207  CREATININE 0.91   Estimated Creatinine Clearance: 41.3 ml/min (by C-G formula based on Cr of 0.91).  Recent Labs  08/29/13 1523  [place] 10.2     Microbiology: Recent Results (from the past 720 hour(s))  URINE CULTURE     Status: None   Collection Time    08/27/13  2:50 PM      Result Value Ref Range Status   Specimen Description URINE, RANDOM   Final   Special Requests NONE   Final   Culture  Setup Time     Final   Value: 08/27/2013 16:36     Performed at [place]     Final   Value: 5,000 COLONIES/ML     Performed at [facility]   Culture     Final   Value: INSIGNIFICANT GROWTH     Performed at [facility]   Report Status 08/28/2013 FINAL   Final  ANAEROBIC CULTURE     Status: None   Collection Time    08/27/13  5:25 PM      Result Value Ref Range Status   Specimen Description PERITONEAL   Final   Special Requests PATIENT ON FOLLOWING ZOSYN   Final   Gram Stain     Final   Value: NO WBC SEEN     NO  ORGANISMS SEEN     Performed at [facility]   Culture     Final   Value: NO ANAEROBES ISOLATED     Performed at [facility]   Report Status 09/01/2013 FINAL   Final  BODY FLUID CULTURE     Status: None   Collection Time    08/27/13  5:25 PM      Result Value Ref Range Status   Specimen Description PERITONEAL   Final   Special Requests PATIENT ON FOLLOWING ZOSYN   Final   Gram Stain     Final   Value: NO WBC SEEN     NO ORGANISMS SEEN     Performed at [facility]   Culture     Final   Value: NO GROWTH 3 DAYS     Performed at [facility]   Report Status 08/30/2013 FINAL   Final   Anti-infectives: 2/19 >>Zosyn >> 2/19 >>Vancomycin >> 2/22  Assessment: 80 yo female presented 2/19 with abdominal pain. CT demonstrates perforated viscus. S/p Laparoscopic converted to open laparaotomy w/ sigmoid colectomy and colostomy.  Day #6 Zosyn 3.375g q8h.  POD#5.  SCr WNL. CrCl~41 ml/min.    Afebrile  WBC improving  Peritoneal cultures negative  Goal of Therapy:  Zosyn dose per renal function  Plan:   Continue Zosyn 3.375gm IV q8h (4hr extended infusions)  F/u SCr in AM.  Hershal Coria, PharmD, BCPS Pager: [phone] 09/01/2013 12:04 PM

## 2013-09-01 NOTE — Evaluation (Signed)
Occupational Therapy Evaluation Patient Details Name: Kathryn [name] MRN: [id number] DOB: February 14, 1928 Today's Date: 09/01/2013 Time: [phone] OT Time Calculation (min): 35 min  OT Assessment / Plan / Recommendation History of present illness pt is s/p colostomy/colectomy due to performated viscus   Clinical Impression   Pt is an 80 year old female who underwent the surgeries above.  She will benefit from skilled OT to increase safety and independence with adls.  Goals in acute are set for supervision to min A.  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    OT Assessment  Patient needs continued OT Services    Follow Up Recommendations  SNF;Home health OT (depending upon progress and support)    Barriers to Discharge      Equipment Recommendations   (possibly 3:1 commode)    Recommendations for Other Services    Frequency  Min 2X/week    Precautions / Restrictions Precautions Precautions: Fall Precaution Comments: abdominal sx Restrictions Weight Bearing Restrictions: No   Pertinent Vitals/Pain No c/o pain but was uncomfortable sitting on commode:  Adjusted height for next use   ADL  Grooming: Set up;Wash/dry hands Where Assessed - Grooming: Unsupported sitting Upper Body Bathing: Set up Where Assessed - Upper Body Bathing: Unsupported sitting Lower Body Bathing: Maximal assistance Where Assessed - Lower Body Bathing: Supported sit to stand Upper Body Dressing: Set up Where Assessed - Upper Body Dressing: Unsupported sitting Lower Body Dressing: Maximal assistance Where Assessed - Lower Body Dressing: Supported sit to stand Toilet Transfer: Minimal assistance Toilet Transfer Method: Arts development officer: Therapist, occupational and Hygiene: +1 Total assistance Where Assessed - Best boy and Hygiene: Sit to stand from 3-in-1 or toilet Equipment Used:  Reacher;Rolling walker;Sock aid Transfers/Ambulation Related to ADLs: spt to 3:1 commode ADL Comments: educated on AE and pt practiced with reacher and sock aide.  Needs reinforcement, if she chooses to use this Did not introduce toilet aide on this visit; pt was fatiqued after using commode--suspect she will do better with hygiene on next visit   OT Diagnosis: Generalized weakness  OT Problem List: Decreased strength;Decreased activity tolerance;Decreased knowledge of use of DME or AE;Pain OT Treatment Interventions: Self-care/ADL training;DME and/or AE instruction;Patient/family education   OT Goals(Current goals can be found in the care plan section) Acute Rehab OT Goals Patient Stated Goal: go home OT Goal Formulation: With patient Time For Goal Achievement: 09/15/13 Potential to Achieve Goals: Good ADL Goals Pt Will Perform Grooming: with supervision;standing Pt Will Perform Lower Body Bathing: with min assist;with adaptive equipment;sit to/from stand Pt Will Perform Lower Body Dressing: with min assist;with adaptive equipment;sit to/from stand Pt Will Transfer to Toilet: with supervision;ambulating;bedside commode (vs high commode) Pt Will Perform Toileting - Clothing Manipulation and hygiene: with supervision;sit to/from stand  Visit Information  Last OT Received On: 09/01/13 Assistance Needed: +1 History of Present Illness: pt is s/p colostomy/colectomy due to performated viscus       Prior [place] expects to be discharged to:: Private residence Living Arrangements: Spouse/significant other Available Help at Discharge: Family Type of Home: House Home Access: Stairs to enter Technical brewer of Steps: 4 Entrance Stairs-Rails: Right Home Layout: Multi-level Alternate Level Stairs-Number of Steps: 1 Alternate Level Stairs-Rails: None Home Equipment: None Additional Comments: pt is caregiver for spouse. Prior Function Level of  Independence: Independent Communication Communication: No difficulties  Vision/Perception     Cognition  Cognition Arousal/Alertness: Awake/alert Behavior During Therapy: WFL for tasks assessed/performed Overall Cognitive Status: Within Functional Limits for tasks assessed    Extremity/Trunk Assessment Upper Extremity Assessment Upper Extremity Assessment: Generalized weakness     Mobility  Transfers: Sit to/from Stand;Stand Pivot Transfers Sit to Stand: Min assist Stand pivot transfers: Min assist General transfer comment: used rw     Exercise     Balance Balance Overall balance assessment: Needs assistance Sitting-balance support: No upper extremity supported;Feet supported Sitting balance-Leahy Scale: Good Standing balance support: No upper extremity supported Standing balance-Leahy Scale: Fair   End of Session OT - End of Session Activity Tolerance: Patient limited by fatigue Patient left: in chair;with call bell/phone within reach  [place] 09/01/2013, 3:11 PM Lesle Chris, OTR/L [phone] 09/01/2013

## 2013-09-01 NOTE — Progress Notes (Signed)
ATTENDING ADDENDUM:  I personally reviewed patient's record, examined the patient, and formulated the following assessment and plan:  Doing well.  Cont Fulls.  If continues to do well, will transition to soft diet tomorrow.  Will d/c Zosyn after 7 days.

## 2013-09-01 NOTE — Evaluation (Signed)
Physical Therapy Evaluation Patient Details Name: Kathryn [name] MRN: [id number] DOB: 01-30-1928 Today's Date: 09/01/2013 Time: 1191-4782 PT Time Calculation (min): 17 min  PT Assessment / Plan / Recommendation History of Present Illness  HPI: Healty 80 y/o with hx of PTVP  Medtr[employer] ADDR01, and some high blood pressure.   admitted with abdominal pain, found to have perforated viscous.  Clinical Impression  Pt  In process of move to new room, eval limited but pt is mobile, weaker than baseline. 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 Pt will benefit  From PT to address problems listed.   PT Assessment  Patient needs continued PT services    Follow Up Recommendations  Home health PT    Does the patient have the potential to tolerate intense rehabilitation      Barriers to Discharge Decreased caregiver support      Equipment Recommendations   (tbd)    Recommendations for Other Services     Frequency Min 3X/week    Precautions / Restrictions Restrictions Weight Bearing Restrictions: No   Pertinent Vitals/Pain Reports legs are sore      Mobility  Transfers Overall transfer level: Needs assistance Equipment used: 1 person hand held assist Transfers: Sit to/from Stand;Stand Pivot Transfers Sit to Stand: From elevated surface;Min assist Stand pivot transfers: Min assist General transfer comment: stood from bed and took step onto/off of scales, took several steps to turn to get to recliner wirth 1 handhold.    Exercises     PT Diagnosis: Difficulty walking;Generalized weakness  PT Problem List: Decreased strength;Decreased activity tolerance;Decreased mobility PT Treatment Interventions: DME instruction;Gait training;Stair training;Functional mobility training;Therapeutic activities;Therapeutic exercise;Patient/family education     PT Goals(Current goals can be found in the care plan section) Acute Rehab PT Goals Patient Stated Goal: to go home and feel better. PT  Goal Formulation: With patient Time For Goal Achievement: 09/15/13 Potential to Achieve Goals: Good  Visit Information  Last PT Received On: 09/01/13 Assistance Needed: +1 History of Present Illness: HPI: Healty 80 y/o with hx of PTVP  Medtr[employer] ADDR01, and some high blood pressure.   admitted with abdominal pain, found to have perforated viscous.       Prior [place] expects to be discharged to:: Private residence Living Arrangements: Spouse/significant other Available Help at Discharge: Family Type of Home: House Home Access: Stairs to enter Technical brewer of Steps: 4 Entrance Stairs-Rails: Right Home Layout: Multi-level Alternate Level Stairs-Number of Steps: 1 Alternate Level Stairs-Rails: None Home Equipment: None Additional Comments: pt is caregiver for spouse. Prior Function Level of Independence: Independent Communication Communication: No difficulties    Cognition  Cognition Arousal/Alertness: Awake/alert Behavior During Therapy: WFL for tasks assessed/performed Overall Cognitive Status: Within Functional Limits for tasks assessed    Extremity/Trunk Assessment Upper Extremity Assessment Upper Extremity Assessment: Defer to OT evaluation Lower Extremity Assessment Lower Extremity Assessment: Generalized weakness Cervical / Trunk Assessment Cervical / Trunk Assessment:  (decreased neck extension)   Balance Balance Overall balance assessment: Needs assistance Sitting-balance support: No upper extremity supported;Feet supported Sitting balance-Leahy Scale: Good Standing balance support: No upper extremity supported Standing balance-Leahy Scale: Fair  End of Session PT - End of Session Activity Tolerance: Patient limited by fatigue Patient left: in chair;with call bell/phone within reach Nurse Communication: Mobility status  GP     Claretha Cooper 09/01/2013, 11:57 AM Tresa Endo PT [phone]

## 2013-09-01 NOTE — Progress Notes (Signed)
Patient ID: Kathryn [name], female   DOB: 09/14/1927, 80 y.o.   MRN: [id number]   Subjective: Still complains of dizziness after taking dilaudid and therefore not getting OOB much.  Sitting up in a chair.  Tolerating full liquids.  Flatus in ostomy, no stool.  No dysuria.  Denies sob/cp.  Objective:  Vital signs:  Filed Vitals:   08/31/13 2000 09/01/13 0000 09/01/13 0035 09/01/13 0400  BP:   151/62 127/53  Pulse:   82 69  Temp: 98.1 F (36.7 C) 98.2 F (36.8 C)  98.6 F (37 C)  TempSrc: Oral Oral  Oral  Resp:   12 15  Height:      Weight:    142 lb 3.2 oz (64.5 kg)  SpO2:   90% 100%    Last BM Date: 08/27/13 (small bloody stool in ostomy.)  Intake/Output   Yesterday:  02/23 0701 - 02/24 0700 In: 1413.3 [P.O.:600; I.V.:663.3; IV Piggyback:150] Out: 1025 [Urine:1025] This shift:    I/O last 3 completed shifts: In: 2385.1 [P.O.:600; I.V.:1572.6; IV Piggyback:212.5] Out: 1350 [Urine:1350]    Physical Exam: General: Pt awake/alert/oriented x3 in no acute distress Chest: CTA No chest wall pain w good excursion CV:  Pulses intact.  Regular rhythm MS: Normal AROM mjr joints.  No obvious deformity Abdomen: Soft.  Mildly distended.  Appropriately tender.  Midline incision is c/d/i.  llq stoma is pink and viable.  Flatus in bag, no stool, serosanguinous output.  Ext:  SCDs BLE.  No mjr edema.  No cyanosis Skin: No petechiae / purpura   Problem List:   Active Problems:   Perforated viscus    Results:   Labs: Results for orders placed during the hospital encounter of 08/27/13 (from the past 48 hour(s))  BASIC METABOLIC PANEL     Status: Abnormal   Collection Time    08/30/13  8:30 AM      Result Value Ref Range   Sodium 139  137 - 147 mEq/L   Potassium 4.6  3.7 - 5.3 mEq/L   Chloride 107  96 - 112 mEq/L   CO2 23  19 - 32 mEq/L   Glucose, Bld 129 (*) 70 - 99 mg/dL   BUN 13  6 - 23 mg/dL   Creatinine, Ser 0.91  0.50 - 1.10 mg/dL   Calcium 8.9  8.4 - 10.5 mg/dL    GFR calc non Af Amer 57 (*) >90 mL/min   GFR calc Af Amer 66 (*) >90 mL/min   Comment: (NOTE)     The eGFR has been calculated using the CKD EPI equation.     This calculation has not been validated in all clinical situations.     eGFR's persistently <90 mL/min signify possible Chronic Kidney     Disease.  CBC     Status: Abnormal   Collection Time    08/30/13  8:30 AM      Result Value Ref Range   WBC 10.8 (*) 4.0 - 10.5 K/uL   RBC 2.98 (*) 3.87 - 5.11 MIL/uL   Hemoglobin 9.1 (*) 12.0 - 15.0 g/dL   HCT 27.7 (*) 36.0 - 46.0 %   MCV 93.0  78.0 - 100.0 fL   MCH 30.5  26.0 - 34.0 pg   MCHC 32.9  30.0 - 36.0 g/dL   RDW 13.4  11.5 - 15.5 %   Platelets 207  150 - 400 K/uL    Imaging / Studies: No results found.  Medications / Allergies: per  chart  Antibiotics: Anti-infectives   Start     Dose/Rate Route Frequency Ordered Stop   08/30/13 1200  vancomycin (VANCOCIN) IVPB 750 mg/150 ml premix  Status:  Discontinued     750 mg 150 mL/hr over 60 Minutes Intravenous Every 12 hours 08/29/13 1616 08/30/13 1214   08/28/13 0200  vancomycin (VANCOCIN) 500 mg in sodium chloride 0.9 % 100 mL IVPB  Status:  Discontinued     500 mg 100 mL/hr over 60 Minutes Intravenous Every 12 hours 08/27/13 1539 08/27/13 2023   08/28/13 0200  vancomycin (VANCOCIN) 500 mg in sodium chloride 0.9 % 100 mL IVPB  Status:  Discontinued     500 mg 100 mL/hr over 60 Minutes Intravenous Every 12 hours 08/27/13 2026 08/29/13 1616   08/27/13 2000  piperacillin-tazobactam (ZOSYN) IVPB 3.375 g     3.375 g 12.5 mL/hr over 240 Minutes Intravenous 3 times per day 08/27/13 1540     08/27/13 1230  piperacillin-tazobactam (ZOSYN) IVPB 3.375 g     3.375 g 100 mL/hr over 30 Minutes Intravenous STAT 08/27/13 1224 08/27/13 1333   08/27/13 1230  vancomycin (VANCOCIN) IVPB 1000 mg/200 mL premix     1,000 mg 200 mL/hr over 60 Minutes Intravenous STAT 08/27/13 1224 08/27/13 1500      Assessment / Plan:  1. LAPAROSCOPY  CONVERTED TO OPEN LAPAROTOMY WITH SIGMOID COLECTOMY AND COLOSTOMY - 08/27/2013 - Dr. Alphonsa Overall - for perforated sigmoid colon  POD #5 Zosyn - started 2/19  Awaiting bed on telemetry  Full liquids, advance with return of bowel function OOB, ambulate, PT/OT Change to scheduled tylenol and add tramadol.  Minimize use of dilaudid if able to tolerate tramadol.  IS 2. Has pacemaker  Dr. Rollene Fare  3. Hypertension  4. DVT prophylaxis - SQ Heparin   Erby Pian, [facility] Surgery Pager [phone] Office [phone]  09/01/2013 8:20 AM

## 2013-09-01 NOTE — Consult Note (Addendum)
WOC ostomy consult note Stoma type/location: LLQ Colostomy Stomal assessment/size: 1 and 3/8 inches round, budded, moist. Not functioning. Peristomal assessment: intact, clear Treatment options for stomal/peristomal skin: None indicated Output Scant sero-sanquinous in pouch Ostomy pouching: 2pc. 2 and 1/4 inches. Education provided: Patient provided with educational booklet and taught to perform Lock and Roll closure.  Return demo given. Basic A&P reviewed.  Patient is in pain at the moment and is not receptive to learning.Understnads that she would have [facility] for reinforcement of hospital teaching; knows that she will demonstrate pouch emptying prior to discharge. [place] nursing team will follow, and will remain available to this patient, the nursing and surgical teams.   Thanks, Maudie Flakes, MSN, RN, [place], [place], [place] [phone])

## 2013-09-02 LAB — BASIC METABOLIC PANEL
BUN: 9 mg/dL (ref 6–23)
CALCIUM: 9.2 mg/dL (ref 8.4–10.5)
CO2: 24 mEq/L (ref 19–32)
Chloride: 102 mEq/L (ref 96–112)
Creatinine, Ser: 0.81 mg/dL (ref 0.50–1.10)
GFR, EST AFRICAN AMERICAN: 76 mL/min — AB (ref >90)
GFR, EST NON AFRICAN AMERICAN: 65 mL/min — AB (ref >90)
GLUCOSE: 99 mg/dL (ref 70–99)
Potassium: 4.3 mEq/L (ref 3.7–5.3)
SODIUM: 139 meq/L (ref 137–147)

## 2013-09-02 LAB — CBC
HCT: 29.3 % — ABNORMAL LOW (ref 36.0–46.0)
HEMOGLOBIN: 9.5 g/dL — AB (ref 12.0–15.0)
MCH: 29.6 pg (ref 26.0–34.0)
MCHC: 32.4 g/dL (ref 30.0–36.0)
MCV: 91.3 fL (ref 78.0–100.0)
PLATELETS: 284 10*3/uL (ref 150–400)
RBC: 3.21 MIL/uL — ABNORMAL LOW (ref 3.87–5.11)
RDW: 13.2 % (ref 11.5–15.5)
WBC: 12.2 10*3/uL — ABNORMAL HIGH (ref 4.0–10.5)

## 2013-09-02 NOTE — Progress Notes (Signed)
Physical Therapy Treatment Patient Details Name: Kathryn [name] MRN: [id number] DOB: [birth year] Today's Date: 09/02/2013 Time: 0263-7858 PT Time Calculation (min): 25 min  PT Assessment / Plan / Recommendation  History of Present Illness pt is s/p colostomy/colectomy due to performated viscus   PT Comments   Pt progressing slowly with issues of pain control and mobility.  Assisted OOB (flat bed) pt had great difficulty.  Amb limited distance with MAX c/o B LE weakness.  Unsteady gait and HIGH FALL RISK. Consulted with evaluating LPT that pt would benefit from [place] [facility].   Follow Up Recommendations  SNF     Does the patient have the potential to tolerate intense rehabilitation     Barriers to Discharge        Equipment Recommendations  Rolling walker with 5" wheels    Recommendations for Other Services    Frequency Min 3X/week   Progress towards PT Goals Progress towards PT goals: Progressing toward goals  Plan      Precautions / Restrictions Precautions Precautions: Fall Precaution Comments: abdominal incision and colostomy Restrictions Weight Bearing Restrictions: No   Pertinent Vitals/Pain C/o 8/10 ABD pain     Mobility  Bed Mobility Overal bed mobility: Needs Assistance Bed Mobility: Supine to Sit;Sit to Supine Supine to sit: Mod assist Sit to supine: Mod assist General bed mobility comments: flat bed pt required increased assist due to ABD pain and 50% VC's on proper tech to minimize ABD muscle use.  Pt required increased time as well to transition position changes.  Transfers Overall transfer level: Needs assistance Equipment used: Rolling walker (2 wheeled) Transfers: Sit to/from Stand Sit to Stand: Min assist;Mod assist General transfer comment: increased time due to ABD pain ans 25% VC's on saferty withnturns and hand placemnent to control decend.  Ambulation/Gait Ambulation/Gait assistance: Min assist;Mod assist Ambulation Distance (Feet): 28  Feet Assistive device: Rolling walker (2 wheeled) Gait Pattern/deviations: Step-through pattern;Decreased stride length;Trunk flexed;Narrow base of support Gait velocity: decreased General Gait Details: limited amb tolerance/distance due to ABD pain and MAX c/o B LE weakness.  Unsteady gait esp with backward gait to bed.  Unsteady with turns.     PT Goals (current goals can now be found in the care plan section)    Visit Information  Last PT Received On: 09/02/13 Assistance Needed: +1 History of Present Illness: pt is s/p colostomy/colectomy due to performated viscus    Subjective Data      Cognition       Balance     End of Session PT - End of Session Equipment Utilized During Treatment: Gait belt Activity Tolerance: Patient limited by fatigue;Patient limited by pain Patient left: in bed;with call bell/phone within reach   Rica Koyanagi  PTA [facility]  Acute  Rehab Pager      [phone]

## 2013-09-02 NOTE — Consult Note (Addendum)
WOC ostomy follow up Stoma type/location: LLQ Colostomy Stomal assessment/size: 1 and 3/8 inches Peristomal assessment: Not seen today Treatment options for stomal/peristomal skin: None indicated Output brown stool. Ostomy pouching: 2pc. 2 and 1/4 inch system Education provided: patient is progressing in learning how to empty her pouch with assist from nursing staff.  Supplies at bedside in the even that she should transfer to SNF/Rehab facility. Enrolled patient in [place] Start Discharge program: Yes [place] nursing team will follow, and will remain available to this patient, the nursing andsurgical teams.  Thanks, Maudie Flakes, MSN, RN, [place], [place], [place] [phone])

## 2013-09-02 NOTE — Progress Notes (Signed)
Patient ID: Kathryn [name], female   DOB: Mar 30, 1928, 80 y.o.   MRN: [id number] 6 Days Post-Op  Subjective: Pt feels better today.  Ostomy working.  Not mobilizing well.  Pain controlled with tylenol and tramadol  Objective: Vital signs in last 24 hours: Temp:  [98 F (36.7 C)-98.4 F (36.9 C)] 98.3 F (36.8 C) (02/25 0528) Pulse Rate:  [70-88] 71 (02/25 0528) Resp:  [16-21] 20 (02/25 0528) BP: (124-152)/(53-77) 152/64 mmHg (02/25 0528) SpO2:  [94 %-100 %] 97 % (02/25 0528) Weight:  [143 lb 12.8 oz (65.227 kg)] 143 lb 12.8 oz (65.227 kg) (02/24 1143) Last BM Date:  (colostomy)  Intake/Output from previous day: 02/24 0701 - 02/25 0700 In: 1480 [P.O.:1080; I.V.:250; IV Piggyback:150] Out: 1225 [Urine:1100; Stool:125] Intake/Output this shift:    PE: Abd: soft, great BS, ND, wound is clean and packed.  Ostomy with good output and air. Stoma is pink and healthy. Heart: regular Lungs: CTAB  Lab Results:   Recent Labs  08/30/13 0830 09/02/13 0523  WBC 10.8* 12.2*  HGB 9.1* 9.5*  HCT 27.7* 29.3*  PLT 207 284   BMET  Recent Labs  08/30/13 0830 09/02/13 0523  NA 139 139  K 4.6 4.3  CL 107 102  CO2 23 24  GLUCOSE 129* 99  BUN 13 9  CREATININE 0.91 0.81  CALCIUM 8.9 9.2   PT/INR No results found for this basename: LABPROT, INR,  in the last 72 hours CMP     Component Value Date/Time   NA 139 09/02/2013 0523   K 4.3 09/02/2013 0523   CL 102 09/02/2013 0523   CO2 24 09/02/2013 0523   GLUCOSE 99 09/02/2013 0523   BUN 9 09/02/2013 0523   CREATININE 0.81 09/02/2013 0523   CALCIUM 9.2 09/02/2013 0523   PROT 7.2 08/27/2013 1150   ALBUMIN 3.9 08/27/2013 1150   AST 23 08/27/2013 1150   ALT 16 08/27/2013 1150   ALKPHOS 73 08/27/2013 1150   BILITOT 2.1* 08/27/2013 1150   GFRNONAA 65* 09/02/2013 0523   GFRAA 76* 09/02/2013 0523   Lipase     Component Value Date/Time   LIPASE 28 08/27/2013 1150       Studies/Results: No results found.  Anti-infectives: Anti-infectives    Start     Dose/Rate Route Frequency Ordered Stop   08/30/13 1200  vancomycin (VANCOCIN) IVPB 750 mg/150 ml premix  Status:  Discontinued     750 mg 150 mL/hr over 60 Minutes Intravenous Every 12 hours 08/29/13 1616 08/30/13 1214   08/28/13 0200  vancomycin (VANCOCIN) 500 mg in sodium chloride 0.9 % 100 mL IVPB  Status:  Discontinued     500 mg 100 mL/hr over 60 Minutes Intravenous Every 12 hours 08/27/13 1539 08/27/13 2023   08/28/13 0200  vancomycin (VANCOCIN) 500 mg in sodium chloride 0.9 % 100 mL IVPB  Status:  Discontinued     500 mg 100 mL/hr over 60 Minutes Intravenous Every 12 hours 08/27/13 2026 08/29/13 1616   08/27/13 2000  piperacillin-tazobactam (ZOSYN) IVPB 3.375 g     3.375 g 12.5 mL/hr over 240 Minutes Intravenous 3 times per day 08/27/13 1540     08/27/13 1230  piperacillin-tazobactam (ZOSYN) IVPB 3.375 g     3.375 g 100 mL/hr over 30 Minutes Intravenous STAT 08/27/13 1224 08/27/13 1333   08/27/13 1230  vancomycin (VANCOCIN) IVPB 1000 mg/200 mL premix     1,000 mg 200 mL/hr over 60 Minutes Intravenous STAT 08/27/13 1224 08/27/13 1500  Assessment/Plan  1. LAPAROSCOPY CONVERTED TO OPEN LAPAROTOMY WITH SIGMOID COLECTOMY AND COLOSTOMY - 08/27/2013 - Dr. Alphonsa Overall - for perforated sigmoid colon  POD #6  Zosyn - started 2/19, will continue since she will be here awaiting placement.  7 days finishes tomorrow.  Advance to regular diet OOB, ambulate, PT/OT  scheduled tylenol and add tramadol. IS  2. Has pacemaker  Dr. Rollene Fare  3. Hypertension  4. DVT prophylaxis - SQ Heparin  5. Will plan on dc to SNF as patient does not have any help at home to assist with wound, ostomy, and daily functions.  Her husband is not able to do any of this.  Once SNF bed found and offer made, patient is stable for dc as long as she tolerates her regular diet today.    LOS: 6 days    Kathryn [name] 09/02/2013, 8:22 AM Pager: [phone]

## 2013-09-02 NOTE — Progress Notes (Signed)
Clinical Social Work Department BRIEF PSYCHOSOCIAL ASSESSMENT 09/02/2013  Patient:  Kathryn [name], Kathryn [name]     Account Number:  [id number]     Admit date:  08/27/2013  Clinical Social Worker:  Venia Minks  Date/Time:  09/02/2013 12:00 M  Referred by:  Physician  Date Referred:  09/02/2013 Referred for  SNF Placement   Other Referral:   Interview type:  Patient Other interview type:    PSYCHOSOCIAL DATA Living Status:  FAMILY Admitted from facility:   Level of care:   Primary support name:  Bill O'daniel Primary support relationship to patient:  SPOUSE Degree of support available:   fair    CURRENT CONCERNS Current Concerns  Post-Acute Placement   Other Concerns:    SOCIAL WORK ASSESSMENT / PLAN CSW met with patient. patient is alert and oriented X3. patient in need of snf placement. Patient states that she lives at home with her husband but that "hes useless". she states that there is no one to help her. She is reluctantly agreeable to snf. She is concerned about the copays. She states that she hopes to be able to return home in less than a week but definitely not longer than two weeks.   Assessment/plan status:   Other assessment/ plan:   Information/referral to community resources:    PATIENT'S/FAMILY'S RESPONSE TO PLAN OF CARE: patient is hesitant to go to snf and is concerned about her insurance copays but she is realistic and understands the need for it.

## 2013-09-02 NOTE — Care Management Note (Addendum)
    Page 1 of 2   09/03/2013     11:45:34 AM   CARE MANAGEMENT NOTE 09/03/2013  Patient:  Kathryn [name], Kathryn [name]   Account Number:  [id number]  Date Initiated:  08/28/2013  Documentation initiated by:  DAVIS,RHONDA  Subjective/Objective Assessment:   elderly female with bowel perforation requiring surgical intervention and formation of colostomy/post op anuria/placed in icu for fld challenge and major surg. post op care     Action/Plan:   tbd/pt is from home and has been indep. in her ald's lives with spouse   Anticipated DC Date:  09/03/2013   Anticipated DC Plan:  [place]  In-house referral  NA      DC Planning Services  CM consult      PAC Choice  NA   Choice offered to / List presented to:  NA   DME arranged  NA      DME agency  NA     [place] arranged  NA      [place] agency  NA   Status of service:  Completed, signed off Medicare Important Message given?  NA - LOS <3 / Initial given by admissions (If response is "NO", the following Medicare IM given date fields will be blank) Date Medicare IM given:   Date Additional Medicare IM given:    Discharge Disposition:  [place]  Per UR Regulation:  Reviewed for med. necessity/level of care/duration of stay  If discussed at [place] of Stay Meetings, dates discussed:   09/01/2013  09/03/2013    Comments:  09/03/13 Kathryn Kozma RN,BSN NCM [phone] D/C SNF.  09/02/13 Verl Whitmore RN,BSN NCM [phone] TRANSFER FROM SDU.NOTED PA NOTE-D/C PLAN SNF,BUT HAVE HHC ORDERS THAT WERE CANCELLED.OT-SNF VS HH.PT-HH.PATIENT STATES SPOUSE UNABLE TO ASSIST.EXPLAINED TO PATIENT HHC SERVICES-INTERMITTENT FOR SKILLED CARE.PATIENT VOICED UNDERSTANDING.PA CONTACTED TO CONFIRM WANTING SNF WORK UP.SW NOTIFIED.GENTIVA DEBBIE REP NOTIFIED TO FOLLOW JUST IN CASE DENIED ST SNF.  [id number] Rosana Hoes, RN, BSN, [place], [phone] Chart reviewed for update of needs and condition. patient has orders to advance diet and to  be moved from sdu to tele bed. next review [id number]  [id number]/Rhonda Eldridge Dace, BSN, [place] [phone] Chart Reviewed for discharge and hospital needs. Discharge needs at time of review:  None present will follow for needs. Review of patient progress due on [id number].

## 2013-09-02 NOTE — Progress Notes (Signed)
                   Agree with Rica Koyanagi PTA note, pt will benefit from SNF.

## 2013-09-02 NOTE — Progress Notes (Signed)
ATTENDING ADDENDUM:  I personally reviewed patient's record, examined the patient, and formulated the following assessment and plan:  Pt doing better.  Having some bowel function.  Feels weak.  Tolerating a diet.  Pt will most likely need a SNF.  Will await placement.

## 2013-09-03 LAB — CBC
HEMATOCRIT: 27.8 % — AB (ref 36.0–46.0)
Hemoglobin: 9 g/dL — ABNORMAL LOW (ref 12.0–15.0)
MCH: 29.7 pg (ref 26.0–34.0)
MCHC: 32.4 g/dL (ref 30.0–36.0)
MCV: 91.7 fL (ref 78.0–100.0)
Platelets: 342 10*3/uL (ref 150–400)
RBC: 3.03 MIL/uL — ABNORMAL LOW (ref 3.87–5.11)
RDW: 13.4 % (ref 11.5–15.5)
WBC: 10.6 10*3/uL — ABNORMAL HIGH (ref 4.0–10.5)

## 2013-09-03 MED ORDER — TRAMADOL HCL 50 MG PO TABS: 50.0000 mg | ORAL_TABLET | Freq: Four times a day (QID) | ORAL | Status: DC | PRN

## 2013-09-03 NOTE — Progress Notes (Signed)
Report called to Wells Guiles at [facility]. Hospital course and discharge reviewed. All questions answered. Callie Fielding RN

## 2013-09-03 NOTE — Progress Notes (Signed)
Clinical Social Work Department CLINICAL SOCIAL WORK PLACEMENT NOTE 09/03/2013  Patient:  Kathryn [name], Kathryn [name]  Account Number:  [id number] Admit date:  08/27/2013  Clinical Social Worker:  Caren Hazy, LCSW  Date/time:  09/03/2013 12:00 M  Clinical Social Work is seeking post-discharge placement for this patient at the following level of care:   [place]   (*CSW will update this form in Epic as items are completed)   09/03/2013  Patient/family provided with [place] Department of Clinical Social Work's list of facilities offering this level of care within the geographic area requested by the patient (or if unable, by the patient's family).  09/03/2013  Patient/family informed of their freedom to choose among providers that offer the needed level of care, that participate in Medicare, Medicaid or managed care program needed by the patient, have an available bed and are willing to accept the patient.  09/03/2013  Patient/family informed of [facility]' ownership interest in [facility], as well as of the fact that they are under no obligation to receive care at this facility.  PASARR submitted to EDS on 09/03/2013 PASARR number received from EDS on 09/03/2013  FL2 transmitted to all facilities in geographic area requested by pt/family on  09/03/2013 FL2 transmitted to all facilities within larger geographic area on 09/03/2013  Patient informed that his/her managed care company has contracts with or will negotiate with  certain facilities, including the following:   t     Patient/family informed of bed offers received:  09/03/2013 Patient chooses bed at [facility] Physician recommends and patient chooses bed at    Patient to be transferred to [place] on  09/03/2013 Patient to be transferred to facility by ptar  The following physician request were entered in Epic:   Additional Comments:

## 2013-09-03 NOTE — Progress Notes (Addendum)
CSW met with patient. Patient is alert and oriented X3. CSW provided bed offers. Patient requesting [facility]. Patient cleared for discharge. Packet copied and placed in [place]. ptar called for transportation. Patient will notify family. Everlena Mackley C. [place] MSW, [place]

## 2013-09-03 NOTE — Discharge Instructions (Signed)
[name], [place] [phone]  OPEN ABDOMINAL SURGERY: POST OP INSTRUCTIONS  Always review your discharge instruction sheet given to you by the facility where your surgery was performed.  IF YOU HAVE DISABILITY OR FAMILY LEAVE FORMS, YOU MUST BRING THEM TO THE OFFICE FOR PROCESSING.  PLEASE DO NOT GIVE THEM TO YOUR DOCTOR.  1. A prescription for pain medication may be given to you upon discharge.  Take your pain medication as prescribed, if needed.  If narcotic pain medicine is not needed, then you may take acetaminophen (Tylenol) or ibuprofen (Advil) as needed. 2. Take your usually prescribed medications unless otherwise directed. 3. If you need a refill on your pain medication, please contact your pharmacy. They will contact our office to request authorization.  Prescriptions will not be filled after 5pm or on week-ends. 4. You should follow a light diet the first few days after arrival home, such as soup and crackers, pudding, etc.unless your doctor has advised otherwise. A high-fiber, low fat diet can be resumed as tolerated.   Be sure to include lots of fluids daily. Most patients will experience some swelling and bruising on the chest and neck area.  Ice packs will help.  Swelling and bruising can take several days to resolve 5. Most patients will experience some swelling and bruising in the area of the incision. Ice pack will help. Swelling and bruising can take several days to resolve..  6. It is common to experience some constipation if taking pain medication after surgery.  Increasing fluid intake and taking a stool softener will usually help or prevent this problem from occurring.  A mild laxative (Milk of Magnesia or Miralax) should be taken according to package directions if there are no bowel movements after 48 hours. 7.  You may have steri-strips (small skin tapes) in place directly over the incision.  These strips should be left on the skin for 7-10 days.  If  your surgeon used skin glue on the incision, you may shower in 24 hours.  The glue will flake off over the next 2-3 weeks.  Any sutures or staples will be removed at the office during your follow-up visit. You may find that a light gauze bandage over your incision may keep your staples from being rubbed or pulled. You may shower and replace the bandage daily. 8. ACTIVITIES:  You may resume regular (light) daily activities beginning the next day--such as daily self-care, walking, climbing stairs--gradually increasing activities as tolerated.  You may have sexual intercourse when it is comfortable.  Refrain from any heavy lifting or straining until approved by your doctor. a. You may drive when you no longer are taking prescription pain medication, you can comfortably wear a seatbelt, and you can safely maneuver your car and apply brakes b. Return to Work: ___________________________________ 37. You should see your doctor in the office for a follow-up appointment approximately two weeks after your surgery.  Make sure that you call for this appointment within a day or two after you arrive home to insure a convenient appointment time. OTHER INSTRUCTIONS:  _____________________________________________________________ _____________________________________________________________  WHEN TO CALL YOUR DOCTOR: 1. Fever over 101.0 2. Inability to urinate 3. Nausea and/or vomiting 4. Extreme swelling or bruising 5. Continued bleeding from incision. 6. Increased pain, redness, or drainage from the incision. 7. Difficulty swallowing or breathing 8. Muscle cramping or spasms. 9. Numbness or tingling in hands or feet or around lips.  The clinic staff is available to  answer your questions during regular business hours.  Please dont hesitate to call and ask to speak to one of the nurses if you have concerns.  For further questions, please visit [email or web address]  Colostomy Home Guide A colostomy  is an opening for stool to leave your body when a medical condition prevents it from leaving through the usual opening (rectum). During a surgery, a piece of large intestine (colon) is brought through a hole in the abdominal wall. The new opening is called a stoma or ostomy. A bag or pouch fits over the stoma to catch stool and gas. Your stool may be liquid, somewhat pasty, or formed. CARING FOR YOUR STOMA  Normally, the stoma looks a lot like the inside of your cheek: pink, red, and moist. At first it may be swollen, but this swelling will decrease within 6 weeks. Keep the skin around your stoma clean and dry. You can gently wash your stoma and the skin around your stoma in the shower with a clean, soft washcloth. If you develop any skin irritation, your caregiver may give you a stoma powder or ointment to help heal the area. Do not use any products other than those specifically given to you by your caregiver.  Your stoma should not be uncomfortable. If you notice any stinging or burning, your pouch may be leaking, and the skin around your stoma may be coming into contact with stool. This can cause skin irritation. If you notice stinging, replace your pouch with a new one and discard the old one. OSTOMY POUCHES  The pouch that fits over the ostomy can be made up of either 1 or 2 pieces. A one-piece pouch has a skin barrier piece and the pouch itself in one unit. A two-piece pouch has a skin barrier with a separate pouch that snaps on and off of the skin barrier. Either way, you should empty the pouch when it is only  to  full. Do not let more stool or gas build up. This could cause the pouch to leak. Some ostomy bags have a built-in gas release valve. Ostomy deodorizer (5 drops) can be put into the pouch to prevent odor. Some people use ostomy lubricant drops inside the pouch to help the stool slide out of the bag more easily and completely.  EMPTYING YOUR OSTOMY POUCH  You may get lessons on how to empty  your pouch from a wound-ostomy nurse before you leave the hospital. Here are the basic steps:  Wash your hands with soap and water.  Sit far back on the toilet.  Put several pieces of toilet paper into the toilet water. This will prevent splashing as you empty the stool into the toilet bowl.  Unclip or unvelcro the tail end of the pouch.  Unroll the tail and empty stool into the toilet.  Clean the tail with toilet paper.  Reroll the tail, and clip or velcro it closed.  Wash your hands again. CHANGING YOUR OSTOMY POUCH  Change your ostomy pouch about every 3 to 4 days for the first 6 weeks, then every 5 to7 days. Always change the bag sooner if there is any leakage or you begin to notice any discomfort or irritation of the skin around the stoma. When possible, plan to change your ostomy pouch before eating or drinking as this will lessen the chance of stool coming out during the pouch change. A wound-ostomy nurse may teach you how to change your pouch before you leave the hospital. Here are  the basic steps:  Hoyle Barr out your supplies.  Wash your hands with soap and water.  Carefully remove the old pouch.  Wash the stoma and allow it to dry. Men may be advised to shave any hair around the stoma very carefully. This will make the adhesive stick better.  Use the stoma measuring guide that comes with your pouch set to decide what size hole you will need to cut in the skin barrier piece. Choose the smallest possible size that will hold the stoma but will not touch it.  Use the guide to trace the circle on the back of the skin barrier piece. Cut out the hole.  Hold the skin barrier piece over the stoma to make sure the hole is the correct size.  Remove the adhesive paper backing from the skin barrier piece.  Squeeze stoma paste around the opening of the skin barrier piece.  Clean and dry the skin around the stoma again.  Carefully fit the skin barrier piece over your stoma.  If you are  using a two-piece pouch, snap the pouch onto the skin barrier piece.  Close the tail of the pouch.  Put your hand over the top of the skin barrier piece to help warm it for about 5 minutes, so that it conforms to your body better.  Wash your hands again. DIET TIPS   Continue to follow your usual diet.  Drink about eight 8 oz glasses of water each day.  You can prevent gas by eating slowly and chewing your food thoroughly.  If you feel concerned that you have too much gas, you can cut back on gas-producing foods, such as:  Spicy foods.  Onions and garlic.  Cruciferous vegetables (cabbage, broccoli, cauliflower, Brussels sprouts).  Beans and legumes.  Some cheeses.  Eggs.  Fish.  Bubbly (carbonated) drinks.  Chewing gum. GENERAL TIPS   You can shower with or without the bag in place.  Always keep the bag on if you are bathing or swimming.  If your bag gets wet, you can dry it with a blow-dryer set to cool.  Avoid wearing tight clothing directly over your stoma so that it does not become irritated or bleed. Tight clothing can also prevent stool from draining into the pouch.  It is helpful to always have an extra skin barrier and pouch with you when traveling. Do not leave them anywhere too warm, as parts of them can melt.  Do not let your seat belt rest on your stoma. Try to keep the seat belt either above or below your stoma, or use a tiny pillow to cushion it.  You can still participate in sports, but you should avoid activities in which there is a risk of getting hit in the abdomen.  You can still have sex. It is a good idea to empty your pouch prior to sex. Some people and their partners feel very comfortable seeing the pouch during sex. Others choose to wear lingerie or a T-shirt that covers the device. SEEK IMMEDIATE MEDICAL CARE IF:  You notice a change in the size or color of the stoma, especially if it becomes very red, purple, black, or pale white.  You  have bloody stools or bleeding from the stoma.  You have abdominal pain, nausea, vomiting, or bloating.  There is anything unusual protruding from the stoma.  You have irritation or red skin around the stoma.  No stool is passing from the stoma.  You have diarrhea (requiring more frequent  than normal pouch emptying). Document Released: 06/28/2003 Document Revised: 09/17/2011 Document Reviewed: 11/22/2010 [facility] Patient Information 2014 [place], [place].   Dressing Change A dressing is a material placed over wounds. It keeps the wound clean, dry, and protected from further injury. This provides an environment that favors wound healing.  BEFORE YOU BEGIN  Get your supplies together. Things you may need include:  Saline solution.  Flexible gauze dressing.  Medicated cream.  Tape.  Gloves.  Abdominal dressing pads.  Gauze squares.  Plastic bags.  Take pain medicine 30 minutes before the dressing change if you need it.  Take a shower before you do the first dressing change of the day. Use plastic wrap or a plastic bag to prevent the dressing from getting wet. REMOVING YOUR OLD DRESSING   Wash your hands with soap and water. Dry your hands with a clean towel.  Put on your gloves.  Remove any tape.  Carefully remove the old dressing. If the dressing sticks, you may dampen it with warm water to loosen it, or follow your caregiver's specific directions.  Remove any gauze or packing tape that is in your wound.  Take off your gloves.  Put the gloves, tape, gauze, or any packing tape into a plastic bag. CHANGING YOUR DRESSING  Open the supplies.  Take the cap off the saline solution.  Open the gauze package so that the gauze remains on the inside of the package.  Put on your gloves.  Clean your wound as told by your caregiver.  If you have been told to keep your wound dry, follow those instructions.  Your caregiver may tell you to do one or more of the  following:  Pick up the gauze. Pour the saline solution over the gauze. Squeeze out the extra saline solution.  Put medicated cream or other medicine on your wound if you have been told to do so.  Put the solution soaked gauze only in your wound, not on the skin around it.  Pack your wound loosely or as told by your caregiver.  Put dry gauze on your wound.  Put abdominal dressing pads over the dry gauze if your wet gauze soaks through.  Tape the abdominal dressing pads in place so they will not fall off. Do not wrap the tape completely around the affected part (arm, leg, abdomen).  Wrap the dressing pads with a flexible gauze dressing to secure it in place.  Take off your gloves. Put them in the plastic bag with the old dressing. Tie the bag shut and throw it away.  Keep the dressing clean and dry until your next dressing change.  Wash your hands. SEEK MEDICAL CARE IF:  Your skin around the wound looks red.  Your wound feels more tender or sore.  You see pus in the wound.  Your wound smells bad.  You have a fever.  Your skin around the wound has a rash that itches and burns.  You see black or yellow skin in your wound that was not there before.  You feel nauseous, throw up, and feel very tired. Document Released: 08/02/2004 Document Revised: 09/17/2011 Document Reviewed: 05/07/2011 [facility] Patient Information 2014 [place], [place].

## 2013-09-03 NOTE — Progress Notes (Signed)
Occupational Therapy Treatment Patient Details Name: Kathryn [name] MRN: [id number] DOB: 06/24/1928 Today's Date: 09/03/2013 Time: 9357-0177 OT Time Calculation (min): 42 min  OT Assessment / Plan / Recommendation  History of present illness pt is s/p colostomy/colectomy due to performated viscus   OT comments  Pt is making good progress with OT.  Needs reinforcement with AE and safety cues for sit to stand  Follow Up Recommendations  SNF    Barriers to Discharge       Equipment Recommendations       Recommendations for Other Services    Frequency Min 2X/week   Progress towards OT Goals Progress towards OT goals: Progressing toward goals  Plan Discharge plan needs to be updated    Precautions / Restrictions Precautions Precautions: Fall Precaution Comments: abdominal incision and colostomy Restrictions Weight Bearing Restrictions: No   Pertinent Vitals/Pain Abdomen 5/10.  RN brought tylenol and pt repositioned    ADL  Grooming: Teeth care;Supervision/safety Where Assessed - Grooming: Supported standing Lower Body Dressing: Moderate assistance (with AE) Where Assessed - Lower Body Dressing: Supported sit to stand Toilet Transfer: Magazine features editor Method: Sit to Loss adjuster, chartered: Bedside commode Toileting - Water quality scientist and Hygiene: Min guard Where Assessed - Best boy and Hygiene: Standing Equipment Used: Reacher;Rolling walker;Sock aid Transfers/Ambulation Related to ADLs: ambulated across room to 3:1 commode.  Cues for safety with sit to stand and to keep body inside of walker ADL Comments: further practiced with AE is needed.  Pt had difficulty using sock aid and needs mod cues for reacher    OT Diagnosis:    OT Problem List:   OT Treatment Interventions:     OT Goals(current goals can now be found in the care plan section)    Visit Information  Last OT Received On: 09/03/13 Assistance Needed:  +1 History of Present Illness: pt is s/p colostomy/colectomy due to performated viscus    Subjective Data      Prior Functioning       Cognition  Cognition Arousal/Alertness: Awake/alert Behavior During Therapy: WFL for tasks assessed/performed Overall Cognitive Status: Within Functional Limits for tasks assessed    Mobility  Bed Mobility Sit to supine: Mod assist General bed mobility comments: cued for sidelying.  assist for bil LEs Transfers Sit to Stand: Min guard General transfer comment: cues for hand placement    Exercises      Balance    End of Session OT - End of Session Activity Tolerance: Patient tolerated treatment well  GO     Saryna Kneeland 09/03/2013, 10:44 AM Lesle Chris, OTR/L [phone] 09/03/2013

## 2013-09-03 NOTE — Discharge Summary (Signed)
Patient ID: Kathryn [name] MRN: [id number] DOB/AGE: [birth year] 80 y.o.  Admit date: 08/27/2013 Discharge date: 09/03/2013  Procedures:  Laparoscopic converted to open laparotomy, sigmoid colectomy, end colostomy and Hartmann's pouch by Kathryn [name] on 08-27-13  Consults: None  Reason for Admission: Healty 80 y/o with hx of PTVP [employer] ADDR01, and some high blood pressure.  She was fine on 08/25/13. Yesterday 2/17 she started having pain in the lower abdomen, she thought it was gas and tried some coke. It didn't help. She wasn't really hungry, she had some nausea, no vomiting yesterday. Pain persisted all day and all of last PM. She had more nausea this AM, some vomiting, worsening pain and she finally came to the ER after calling PCP. No constipation, no diarrhea, no GI issue till yesterday morning.  Work up in the ER shows glucose 131, but otherwise normal. Lactic acid up to 2.5. Wbc is 9.7 with left shift. PT, PTT are normal. A CT of the abdomen was obtained ans shows free air and free fluid consistent with a perforation. Multiple small bowel loops demonstrate mild scattered wall thickening and minimal wall hyperenhancement consistent with an enteritis,, differential diagnosis including infection and Crohn's disease, ischemia less likely but not completely excluded. Minimal sigmoid diverticulosis without evidence of diverticulitis. We have been called to see the patient.  Admission Diagnoses:  1. Perforated viscus 2. [name]/o MI 3. HTN  Hospital Course:  The patient was admitted, placed on IV abx therapy, Zosyn, and taken to the OR for surgical intervention.  She underwent colectomy, colostomy for perforated sigmoid colon.  Her wound was left open.  She was sent to the SDU for monitoring.  She was started on sips of clears on POD 2.  She continued to have limited bowel function until POD 4.  At this time, she was stable to have her diet advanced to full liquids and was also able to be transferred  to the floor.  Her diet was then able to be advanced as tolerates.  She underwent ostomy care teaching.  Her wound underwent routine dressing changes.  PT/OT saw patient and recommended SNF.  On POD 7, the patient is stable for dc to SNF.  PE: Abd: soft, appropriately tender, +BS, ND, wound is clean and packed, stoma is pink and viable with good ostomy output.  Discharge Diagnoses:  Active Problems:   Perforated viscus s/p lap converted to ex lap with Hartman's procedure for perforated sigmoid colon HTN [name]/o MI  Discharge Medications:   Medication List         aspirin EC 81 MG tablet  Take 81 mg by mouth daily.     metoprolol tartrate 25 MG tablet  Commonly known as:  LOPRESSOR  Take 1 tablet by mouth 2 (two) times daily.     traMADol 50 MG tablet  Commonly known as:  ULTRAM  Take 1 tablet (50 mg total) by mouth every 6 (six) hours as needed for moderate pain or severe pain.     valsartan 80 MG tablet  Commonly known as:  DIOVAN  Take 1 tablet by mouth every morning.        Discharge Instructions:     Follow-up Information   Follow up with Kathryn H, MD In 3 weeks. (our office will call your rehab facility with time and date)    Specialty:  General Surgery   Contact information:   [address] [place] [address] [phone]  Signed: Ressie Slevin [name] 09/03/2013, 9:01 AM

## 2013-09-03 NOTE — Discharge Summary (Signed)
ATTENDING ADDENDUM:  I personally reviewed patient's record, examined the patient, and formulated the following assessment and plan:  Pt ready for d/c.  She has completed a course of antibiotics.  She will continue dressing packing changes at her rehab facility.  She will see [place] back in the office in a couple weeks.

## 2013-09-04 ENCOUNTER — Telehealth (INDEPENDENT_AMBULATORY_CARE_PROVIDER_SITE_OTHER): Payer: Self-pay

## 2013-09-04 NOTE — Telephone Encounter (Signed)
P/O appt 09/25/13 12:15p with Dr. Lucia Gaskins  husband aware

## 2013-09-11 ENCOUNTER — Telehealth (INDEPENDENT_AMBULATORY_CARE_PROVIDER_SITE_OTHER): Payer: Self-pay | Admitting: General Surgery

## 2013-09-11 NOTE — Telephone Encounter (Signed)
Female, identified as the girlfriend of pt's son, called in regard to this pt.  Explained immediately that [facility] personnel cannot speak with anyone beyond the pt herself, as there is no HIPPA clearance to do so.  She understands and will pass this on to the pt to call for herself.

## 2013-09-14 ENCOUNTER — Ambulatory Visit: Payer: Medicare Other | Admitting: Cardiovascular Disease

## 2013-09-25 ENCOUNTER — Encounter (INDEPENDENT_AMBULATORY_CARE_PROVIDER_SITE_OTHER): Payer: Self-pay | Admitting: Surgery

## 2013-09-25 ENCOUNTER — Ambulatory Visit (INDEPENDENT_AMBULATORY_CARE_PROVIDER_SITE_OTHER): Payer: Medicare Other | Admitting: Surgery

## 2013-09-25 VITALS — BP 128/84 | HR 76 | Temp 97.0°F | Resp 18 | Ht 64.0 in | Wt 119.0 lb

## 2013-09-25 DIAGNOSIS — R198 Other specified symptoms and signs involving the digestive system and abdomen: Secondary | ICD-10-CM

## 2013-09-25 DIAGNOSIS — R69 Illness, unspecified: Secondary | ICD-10-CM

## 2013-09-25 NOTE — Progress Notes (Signed)
[place], MD,  [address]  [place], [place]    [place] Phone:  [phone] FAX:  [phone]   Re:   Kathryn [name] DOB:   Apr 23, 1928 MRN:   [id number]  ASSESSMENT AND PLAN: 1.  Sigmoid colectomy, Hartmann's pouch (emergency) - 08/28/2103 - D. Anahit Klumb  For perforated diverticulitis of the sigmoid colon.  Doing well.  Was at [facility] until last Friday, 09/18/2013.  But she is home now.  Will plan reversal of colostomy in around 6 months from surgery.  Wound looks good.  I'll see her back in 4 weeks for wound recheck.  2. Has pacemaker   Saw Dr. Rollene Fare.  She thinks that she is to see Dr. Keene Breath now, but she has not met him.  I encourage her to go ahead and make an appt to make sure everything is okay. 3. Hypertension  HISTORY OF PRESENT ILLNESS: Chief Complaint  Patient presents with  . Routine Post Op    colostomy    Kathryn [name] is a 80 y.o. (DOB: 1928-04-09)  white  female who is a patient of PERINI,MARK A, MD and comes to me today for follow up of perforated colon.  Patient was at [facility] until last week.  She got a little lightheaded.  But she stopped some Lasix that they had started on her and she is doing much better.  Her son with her.  Overall she looks better than I would have expected.  We talked about reversing her colostomy.  I gave her a copy of her path report and I gave her literature on diverticulitis. She had a sigmoidoscopy by Dr. Gaspar Bidding around 2000, but had syncope.  So, she has never had a colonoscopy.  She will need a colonoscopy, a BE, or both, before reversing her colostomy. She is supposed to see Dr. Joylene Draft in April.   Past Medical History  Diagnosis Date  . MI, old   . DVT (deep venous thrombosis) 2010   Review of Systems Has seen brother [name] for wax in her ears.  SOCIAL HISTORY: Married. Son, [name], is with patient today.  PHYSICAL EXAM: BP 128/84  Pulse 76   Temp(Src) 97 F (36.1 C)  Resp 18  Ht 5' 4" (1.626 m)  Wt 119 lb (53.978 kg)  BMI 20.42 kg/m2  General: WN thin WF who is alert.  For as sick as she was, she does not look bad today. HEENT: Normal. Pupils equal.  Neck: Supple. No mass.  No thyroid mass.   Lymph Nodes:  No supraclavicular or cervical nodes. Lungs: Clear to auscultation and symmetric breath sounds. Heart:  RRR. No murmur or rub.  Has a pacemaker in the right upper chest. Abdomen: Soft. Open wound is clean.  I removed one suture knot today.  Her colostomy is in the LLQ. Extremities:  Good strength and ROM  in upper and lower extremities.  DATA REVIEWED: Epic notes. Path to patient.  Alphonsa Overall, MD,  [facility], [place] [address]  [place], [place]    [place] Phone:  [phone] FAX:  [phone]

## 2013-10-27 ENCOUNTER — Encounter: Payer: Self-pay | Admitting: *Deleted

## 2013-10-29 ENCOUNTER — Encounter (INDEPENDENT_AMBULATORY_CARE_PROVIDER_SITE_OTHER): Payer: Medicare Other | Admitting: Surgery

## 2013-11-02 ENCOUNTER — Encounter: Payer: Medicare Other | Admitting: Cardiovascular Disease

## 2013-11-04 ENCOUNTER — Encounter (INDEPENDENT_AMBULATORY_CARE_PROVIDER_SITE_OTHER): Payer: Self-pay | Admitting: Surgery

## 2013-11-04 ENCOUNTER — Ambulatory Visit (INDEPENDENT_AMBULATORY_CARE_PROVIDER_SITE_OTHER): Payer: Medicare Other | Admitting: Surgery

## 2013-11-04 VITALS — BP 122/70 | HR 77 | Temp 97.8°F | Ht 63.0 in | Wt 121.0 lb

## 2013-11-04 DIAGNOSIS — R198 Other specified symptoms and signs involving the digestive system and abdomen: Secondary | ICD-10-CM

## 2013-11-04 DIAGNOSIS — R69 Illness, unspecified: Secondary | ICD-10-CM

## 2013-11-04 NOTE — Progress Notes (Signed)
[place], MD,  Kathryn [name].,  Kathryn [name], Kathryn [name]    Kathryn [name] Phone:  [phone] FAX:  [phone]   Re:   Kathryn [name] DOB:   04-01-1928 MRN:   [id number]  ASSESSMENT AND PLAN: 1.  Sigmoid colectomy, Hartmann's pouch (emergency) - 08/28/2103 - Kathryn [name]  For perforated diverticulitis of the sigmoid colon.  Doing well.    I'll see her back in early July.  At that time we will get a BE (and possibly a colonoscopy) and aim to set her surgery fo reversal of her colostomy in August.  2. Has pacemaker   Saw Dr. Rollene [name].  She has an appt to see Dr. Keene [name] in the next several weeks.  She has not met him yet.  I encouraged her to talk to him about the planned surgery in August.   3. Hypertension  HISTORY OF PRESENT ILLNESS: Chief Complaint  Patient presents with  . Follow-up    Kathryn [name] is a 80 y.o. (DOB: Feb 12, 1928)  white  female who is a patient of [name],Kathryn A, MD and comes to me today for follow up of perforated colon.  She comes by herself. She has gained much of her strength back.  The home care nurse last visited her on 4/10.  So she is on her own.   Her midline wound is essentially healed.  And she is learning to change her colostomy bags. She says that she has a lot of gas and lot of stool.  She has been that way much of her life.  But she tends to fill the bags up quickly.  She is trying some Gas X, but this is giving her headaches.  I am not sure what in Gas X would give headaches.  She saw Dr. Joylene [name] in April, 2015.  He is pride of how she is doing.  Past Medical History  Diagnosis Date  . 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 DVT (deep venous thrombosis) 2010  . Bradycardia   . Syncope     permanent pacemaker 2008   Review of Systems Has seen brother Kathryn [name] for wax in her ears.  SOCIAL HISTORY: Married.  Her husband has vascular problems and is seeing Dr. Donnetta [name]. She has a son, Kathryn [name].  PHYSICAL EXAM: BP 122/70   Pulse 77  Temp(Src) 97.8 F (36.6 C)  Ht 5' 3"  (1.6 m)  Wt 121 lb (54.885 kg)  BMI 21.44 kg/m2  General: WN thin WF who is alert.  For as sick as she was, she does not look bad today. HEENT: Normal. Pupils equal.  Neck: Supple. No mass.  No thyroid mass.   Lymph Nodes:  No supraclavicular or cervical nodes. Lungs: Clear to auscultation and symmetric [name] sounds. Heart:  RRR. No murmur or rub.  Has a pacemaker in the right upper chest. Abdomen: Soft. Lower midline wound is healed excpet for two small areas of scab. Extremities:  Good strength and ROM  in upper and lower extremities.  DATA REVIEWED: Nothing new.  Kathryn Overall, MD,  K[facility] [name] [facility], Kathryn [name].,  Kathryn [name], Kathryn [name]    [place] Phone:  [phone] FAX:  [phone]

## 2013-11-13 ENCOUNTER — Ambulatory Visit (INDEPENDENT_AMBULATORY_CARE_PROVIDER_SITE_OTHER): Payer: Medicare Other | Admitting: *Deleted

## 2013-11-13 DIAGNOSIS — I442 Atrioventricular block, complete: Secondary | ICD-10-CM

## 2013-11-13 DIAGNOSIS — I4891 Unspecified atrial fibrillation: Secondary | ICD-10-CM

## 2013-11-13 LAB — MDC_IDC_ENUM_SESS_TYPE_INCLINIC
Battery Impedance: 1712 Ohm
Battery Remaining Longevity: 2
Brady Statistic AP VP Percent: 67.7 %
Brady Statistic AP VS Percent: 0.1 % — CL
Brady Statistic AS VP Percent: 32.3 %
Brady Statistic AS VS Percent: 0.1 % — CL
Lead Channel Impedance Value: 695 Ohm
Lead Channel Pacing Threshold Amplitude: 0.75 V
Lead Channel Pacing Threshold Pulse Width: 0.4 ms
Lead Channel Pacing Threshold Pulse Width: 0.4 ms
Lead Channel Sensing Intrinsic Amplitude: 2 mV
Lead Channel Setting Pacing Amplitude: 2 V
Lead Channel Setting Sensing Sensitivity: 4 mV
MDC IDC MSMT BATTERY VOLTAGE: 2.71 V
MDC IDC MSMT LEADCHNL RA IMPEDANCE VALUE: 404 Ohm
MDC IDC MSMT LEADCHNL RV PACING THRESHOLD AMPLITUDE: 0.5 V
MDC IDC SET LEADCHNL RA PACING AMPLITUDE: 1.5 V
MDC IDC SET LEADCHNL RV PACING PULSEWIDTH: 0.4 ms

## 2013-11-13 LAB — PACEMAKER DEVICE OBSERVATION

## 2013-11-13 NOTE — Progress Notes (Signed)
Pacemaker check in clinic. Normal device function. Thresholds, sensing, impedances consistent with previous measurements. Device programmed to maximize longevity. 167 mode switches (0.2%)---14 AHR episodes---max dur. 8 hrs 19 mins (12-5 during pneumonia), Max A >400, Max V 108----AF/AFL+ASA 87---80 yo, F, no DM, PAD, or h/o prior CVA. No high ventricular rates noted. Device programmed at appropriate safety margins. Histogram distribution appropriate for patient activity level. Device programmed to optimize intrinsic conduction. Estimated longevity 2 years. Patient will follow up with [place] on 7-16.

## 2013-12-11 ENCOUNTER — Telehealth (INDEPENDENT_AMBULATORY_CARE_PROVIDER_SITE_OTHER): Payer: Self-pay

## 2013-12-11 NOTE — Telephone Encounter (Signed)
Patient is stating skin around her stoma is red and irritated.Advised her to make sure opening of stoma is not too large, stool could increase skin irration. She can try anti fugal power. Apply to skin around stoma ,apply by dabbing  non sting skin prep  To top of power the let dry. Apply stoma wafer. Patient verbalized understanding .

## 2013-12-15 ENCOUNTER — Encounter: Payer: Self-pay | Admitting: Cardiovascular Disease

## 2013-12-24 ENCOUNTER — Telehealth (INDEPENDENT_AMBULATORY_CARE_PROVIDER_SITE_OTHER): Payer: Self-pay

## 2013-12-24 NOTE — Telephone Encounter (Signed)
Patient is stating skin around her stoma is red and irritated.Advised her to make sure opening of stoma is not too large, stool could increase skin irration. Pt states she called Holley Raring a couple weeks ago and she had told her to apply a fungal powder. She states that she has been doing this with little change in the irritation. Pt states that she has had to change wafers due to stoma has started shrinking. Pt states that she was suppose to come back to see Dr Lucia Gaskins in July. Informed pt that I would get with Glenda to see if she can fit pt in schedule for a follow-up. Pt verbalized understanding and agrees with POC.

## 2013-12-25 NOTE — Telephone Encounter (Signed)
Patient is asking for appt for pre op for colostomy take down . Advised patient DR. Lucia Gaskins s note states she is to have ov in July for possible colostomy take down in August . Appt made for 01/27/14 12 noon.   Advised patient to continue to put anti fungal powder and non sting skin prep around stoma and to keep area clean and dry . Patient verbalized understanding

## 2014-01-19 ENCOUNTER — Encounter: Payer: Self-pay | Admitting: Cardiovascular Disease

## 2014-01-19 ENCOUNTER — Ambulatory Visit (INDEPENDENT_AMBULATORY_CARE_PROVIDER_SITE_OTHER): Payer: Medicare Other | Admitting: Cardiovascular Disease

## 2014-01-19 VITALS — BP 160/90 | HR 75 | Resp 16 | Ht 63.0 in | Wt 120.4 lb

## 2014-01-19 DIAGNOSIS — Z01810 Encounter for preprocedural cardiovascular examination: Secondary | ICD-10-CM

## 2014-01-19 DIAGNOSIS — I480 Paroxysmal atrial fibrillation: Secondary | ICD-10-CM | POA: Insufficient documentation

## 2014-01-19 DIAGNOSIS — I10 Essential (primary) hypertension: Secondary | ICD-10-CM

## 2014-01-19 DIAGNOSIS — I442 Atrioventricular block, complete: Secondary | ICD-10-CM

## 2014-01-19 DIAGNOSIS — I4891 Unspecified atrial fibrillation: Secondary | ICD-10-CM

## 2014-01-19 DIAGNOSIS — Z950 Presence of cardiac pacemaker: Secondary | ICD-10-CM

## 2014-01-19 DIAGNOSIS — R001 Bradycardia, unspecified: Secondary | ICD-10-CM

## 2014-01-19 DIAGNOSIS — I498 Other specified cardiac arrhythmias: Secondary | ICD-10-CM

## 2014-01-19 LAB — MDC_IDC_ENUM_SESS_TYPE_INCLINIC
Battery Remaining Longevity: 20 mo
Brady Statistic AP VP Percent: 83 %
Brady Statistic AP VS Percent: 0 %
Date Time Interrogation Session: 20150714152617
Lead Channel Impedance Value: 424 Ohm
Lead Channel Impedance Value: 742 Ohm
Lead Channel Pacing Threshold Amplitude: 0.75 V
Lead Channel Pacing Threshold Pulse Width: 0.4 ms
Lead Channel Setting Pacing Amplitude: 2 V
Lead Channel Setting Pacing Pulse Width: 0.4 ms
Lead Channel Setting Sensing Sensitivity: 4 mV
MDC IDC MSMT BATTERY IMPEDANCE: 2210 Ohm
MDC IDC MSMT BATTERY VOLTAGE: 2.69 V
MDC IDC MSMT LEADCHNL RA PACING THRESHOLD AMPLITUDE: 0.5 V
MDC IDC MSMT LEADCHNL RA PACING THRESHOLD PULSEWIDTH: 0.4 ms
MDC IDC MSMT LEADCHNL RA SENSING INTR AMPL: 2 mV
MDC IDC SET LEADCHNL RV PACING AMPLITUDE: 2.5 V
MDC IDC STAT BRADY AS VP PERCENT: 17 %
MDC IDC STAT BRADY AS VS PERCENT: 0 %

## 2014-01-19 LAB — PACEMAKER DEVICE OBSERVATION

## 2014-01-19 NOTE — Assessment & Plan Note (Signed)
Marginally high blood pressure today, but very well-controlled blood pressure on all her other recent office visits and at home. No changes are made her medications. It is important that her beta blocker not be stopped abruptly when she had surgery, to avoid rebound arrhythmia and rebound hypertension.

## 2014-01-19 NOTE — Assessment & Plan Note (Signed)
She is pacemaker dependent. Normal device function. Do not anticipate problems with pacemaker function during upcoming infra-diaphragmatic surgery.

## 2014-01-19 NOTE — Assessment & Plan Note (Signed)
Remote pacemaker checks every 3 months, followup in the office in 6 months

## 2014-01-19 NOTE — Assessment & Plan Note (Signed)
CHADSVasc score is 3 and statistically she would benefit from using a true anticoagulants rather than aspirin. She is extremely reluctant to consider full dose anticoagulants, since she is afraid of bleeding risk. In deed, if she had been on anticoagulants when she presented with her perforated viscus, her difficult situation might have well proven to be disastrous. Nevertheless, statistically she is more likely to have an ischemic stroke in a series bleeding complication.  Her overall burden of atrial fibrillation is very low. Now is not the time to institute anticoagulation therapy since she is planning takedown of her colostomy. We'll continue to monitor the frequency of atrial fibrillation using her pacemaker and continues to have discussions regarding the value of full anticoagulation. I have told her to seek immediate echo attention for any focal neurological deficits. If she should have a stroke or TIA, the risk-benefit balance with tilt heavily in the direction of anticoagulants the

## 2014-01-19 NOTE — Assessment & Plan Note (Signed)
I think she is at low risk of major cardiovascular complications with the upcoming surgery. It is possible that she would develop atrial arrhythmia due to the hyperadrenergic postoperative state. Since she has complete heart block, high ventricular rates will not be seen.

## 2014-01-19 NOTE — Patient Instructions (Signed)
Remote monitoring is used to monitor your Pacemaker of ICD from home. This monitoring reduces the number of office visits required to check your device to one time per year. It allows [place] to keep an eye on the functioning of your device to ensure it is working properly. You are scheduled for a device check from home on 04/22/2014. You may send your transmission at any time that day. If you have a wireless device, the transmission will be sent automatically. After your physician reviews your transmission, you will receive a postcard with your next transmission date.  Dr Sallyanne Kuster recommends that you schedule a follow-up appointment in 6 months.

## 2014-01-19 NOTE — Progress Notes (Signed)
Patient ID: Kathryn [name], female   DOB: January 17, 1928, 80 y.o.   MRN: [id number]      Reason for office visit Pacemaker followup  This is my first face-to-face encounter with Mrs. O'Daniel although I've seen several of her remote pacemaker checks. She is a long-time patient of Dr. Terance Ice. She has complete heart block and a dual-chamber Medtronic pacemaker which was implanted in 2000, with a generator change out in 2008. In 2000 she presented with what appeared to be an acute anterior wall myocardial infarction following a bradycardia-related ventricular fibrillation episode during treadmill stress testing as an outpatient. She underwent emergency cardiac catheterization and was found to have normal coronary arteries with a Takotsubo cardiomyopathy. She subsequently see the dual-chamber permanent pacemaker and then had complete normalization of left ventricular systolic function. She's taking low doses of beta blocker and angiotensin receptor blocker. Is not clear to me whether she truly has a diagnosis of systemic hypertension, or these medicines were introduced to treat her cardiomyopathy.  Over the last couple of years her device as recorded in frequent but fairly lengthy episodes of paroxysmal atrial fibrillation. These occur about twice a year. The most recent event occurred on 01/16/2014 at 4 AM and lasted for over 9 hours. Since she has complete heart block high ventricular rates have not been an issue. She is unaware of the arrhythmia. She is terrified of the risks of bleeding with anticoagulants. She does take aspirin. Has never had a stroke or TIA.  In February of 2015 she underwent emergency sigmoid colectomy and Hartmann's pouch for a perforated diverticulitis of the sigmoid colon. She was in rehabilitation for about a month. She is scheduled to undergo reversal of her colostomy in August, with Dr. Alphonsa Overall.  She feels well today and has no complaints.  Her pacemaker function  is normal. She has complete heart block without any escape rhythm and is pacemaker dependent. In addition to 100% ventricular pacing, she also has 83% atrial pacing. No permanent changes were made to device function today. As mentioned she had one lengthy episode of atrial fibrillation for over 9 hours but the overall burden of arrhythmia is only 0.6%.   Allergies  Allergen Reactions  . Darvocet [Propoxyphene N-Acetaminophen] Other (See Comments)    Hallucination   . Epinephrine Other (See Comments)    Severe increase in heart rate  . Percocet [Oxycodone-Acetaminophen] Other (See Comments)    Hallucinations     Current Outpatient Prescriptions  Medication Sig Dispense Refill  . aspirin EC 81 MG tablet Take 81 mg by mouth daily.      . metoprolol tartrate (LOPRESSOR) 25 MG tablet Take 1 tablet by mouth 2 (two) times daily.      . valsartan (DIOVAN) 80 MG tablet Take 0.5 tablets by mouth every morning.        No current facility-administered medications for this visit.    Past Medical History  Diagnosis Date  . MI, old 37  . DVT (deep venous thrombosis) 2010  . Bradycardia   . Syncope     permanent pacemaker 2008    Past Surgical History  Procedure Laterality Date  . Pacemaker insertion  12/02/1998    medtronic  . Cholecystectomy    . Abdominal hysterectomy    . Appendectomy    . Tonsillectomy    . Cataract extraction, bilateral    . Laparotomy N/A 08/27/2013    Procedure: LAPAROSCOPY CONVERTED TO OPEN LAPAROTOMY WITH SIGMOID COLECTOMY AND COLOSTOMY;  Surgeon:  Shann Medal, MD;  Location: [facility];  Service: General;  Laterality: N/A;  . Permanent pacemaker generator change  02/28/2007    Medtronic Adapta  . Breast enhancement surgery    . US echocardiography  10/09/2011    mild LAE, mild MR,mild to mod TR    Family History  Problem Relation Age of Onset  . Heart disease Mother   . Heart disease Father     History   Social History  . Marital Status: Married     Spouse Name: N/A    Number of Children: N/A  . Years of Education: N/A   Occupational History  . Not on file.   Social History Main Topics  . Smoking status: Never Smoker   . Smokeless tobacco: Never Used  . Alcohol Use: No  . Drug Use: No  . Sexual Activity: No   Other Topics Concern  . Not on file   Social History Narrative  . No narrative on file    Review of systems: The patient specifically denies any chest pain at rest or with exertion, dyspnea at rest or with exertion, orthopnea, paroxysmal nocturnal dyspnea, syncope, palpitations, focal neurological deficits, intermittent claudication, lower extremity edema, unexplained weight gain, cough, hemoptysis or wheezing.  The patient also denies abdominal pain, nausea, vomiting, dysphagia, diarrhea, constipation, polyuria, polydipsia, dysuria, hematuria, frequency, urgency, abnormal bleeding or bruising, fever, chills, unexpected weight changes, mood swings, change in skin or hair texture, change in voice quality, auditory or visual problems, allergic reactions or rashes, new musculoskeletal complaints other than usual "aches and pains".   PHYSICAL EXAM BP 160/90  Pulse 75  Resp 16  Ht 5\' 3"  (1.6 m)  Wt 120 lb 6.4 oz (54.613 kg)  BMI 21.33 kg/m2  General: Alert, oriented x3, no distress Head: no evidence of trauma, PERRL, EOMI, no exophtalmos or lid lag, no myxedema, no xanthelasma; normal ears, nose and oropharynx Neck: normal jugular venous pulsations and no hepatojugular reflux; brisk carotid pulses without delay and no carotid bruits Chest: clear to auscultation, no signs of consolidation by percussion or palpation, normal fremitus, symmetrical and full respiratory excursions; healthy subclavian pacemaker site Cardiovascular: normal position and quality of the apical impulse, regular rhythm, normal first and paradoxically second heart sounds, no murmurs, rubs or gallops Abdomen: no tenderness or distention, no masses by  palpation, no abnormal pulsatility or arterial bruits, normal bowel sounds, no hepatosplenomegaly Extremities: no clubbing, cyanosis or edema; 2+ radial, ulnar and brachial pulses bilaterally; 2+ right femoral, posterior tibial and dorsalis pedis pulses; 2+ left femoral, posterior tibial and dorsalis pedis pulses; no subclavian or femoral bruits Neurological: grossly nonfocal   EKG: AV sequential pacing  Lipid Panel  No results found for this basename: chol, trig, hdl, cholhdl, vldl, ldlcalc    BMET    Component Value Date/Time   NA 139 09/02/2013 0523   K 4.3 09/02/2013 0523   CL 102 09/02/2013 0523   CO2 24 09/02/2013 0523   GLUCOSE 99 09/02/2013 0523   BUN 9 09/02/2013 0523   CREATININE 0.81 09/02/2013 0523   CALCIUM 9.2 09/02/2013 0523   GFRNONAA 65* 09/02/2013 0523   GFRAA 76* 09/02/2013 0523     ASSESSMENT AND PLAN Complete heart block She is pacemaker dependent. Normal device function. Do not anticipate problems with pacemaker function during upcoming infra-diaphragmatic surgery.  Pacemaker - dual-chamber Medtronic Remote pacemaker checks every 3 months, followup in the office in 6 months  Paroxysmal atrial fibrillation CHADSVasc score is 3 and  statistically she would benefit from using a true anticoagulants rather than aspirin. She is extremely reluctant to consider full dose anticoagulants, since she is afraid of bleeding risk. In deed, if she had been on anticoagulants when she presented with her perforated viscus, her difficult situation might have well proven to be disastrous. Nevertheless, statistically she is more likely to have an ischemic stroke in a series bleeding complication.  Her overall burden of atrial fibrillation is very low. Now is not the time to institute anticoagulation therapy since she is planning takedown of her colostomy. We'll continue to monitor the frequency of atrial fibrillation using her pacemaker and continues to have discussions regarding the value  of full anticoagulation. I have told her to seek immediate echo attention for any focal neurological deficits. If she should have a stroke or TIA, the risk-benefit balance with tilt heavily in the direction of anticoagulants the  HTN (hypertension) Marginally high blood pressure today, but very well-controlled blood pressure on all her other recent office visits and at home. No changes are made her medications. It is important that her beta blocker not be stopped abruptly when she had surgery, to avoid rebound arrhythmia and rebound hypertension.  Preop cardiovascular exam I think she is at low risk of major cardiovascular complications with the upcoming surgery. It is possible that she would develop atrial arrhythmia due to the hyperadrenergic postoperative state. Since she has complete heart block, high ventricular rates will not be seen.   Orders Placed This Encounter  Procedures  . EKG 12-Lead   No orders of the defined types were placed in this encounter.    Holli Humbles, MD, [place] [phone] office [phone] pager

## 2014-01-21 ENCOUNTER — Encounter: Payer: Medicare Other | Admitting: Cardiovascular Disease

## 2014-01-27 ENCOUNTER — Encounter (INDEPENDENT_AMBULATORY_CARE_PROVIDER_SITE_OTHER): Payer: Self-pay | Admitting: Surgery

## 2014-01-27 ENCOUNTER — Ambulatory Visit (INDEPENDENT_AMBULATORY_CARE_PROVIDER_SITE_OTHER): Payer: Medicare Other | Admitting: Surgery

## 2014-01-27 ENCOUNTER — Other Ambulatory Visit (INDEPENDENT_AMBULATORY_CARE_PROVIDER_SITE_OTHER): Payer: Self-pay

## 2014-01-27 VITALS — BP 126/80 | HR 76 | Temp 98.0°F | Resp 18 | Ht 67.0 in | Wt 122.0 lb

## 2014-01-27 DIAGNOSIS — K5732 Diverticulitis of large intestine without perforation or abscess without bleeding: Secondary | ICD-10-CM | POA: Insufficient documentation

## 2014-01-27 DIAGNOSIS — Z933 Colostomy status: Secondary | ICD-10-CM | POA: Insufficient documentation

## 2014-01-27 DIAGNOSIS — Z433 Encounter for attention to colostomy: Secondary | ICD-10-CM

## 2014-01-27 NOTE — Progress Notes (Addendum)
[place], MD,  [address]  [place], [place]    [place] Phone:  [phone] FAX:  [phone]   Re:   Kathryn [name] DOB:   Sep 27, 1927 MRN:   [id number]  ASSESSMENT AND PLAN: 1.  Sigmoid colectomy, Hartmann's pouch (emergency) - 08/28/2103 - D. Antonio Creswell  For perforated diverticulitis of the sigmoid colon.  She is ready to get her colostomy reversed.  Will set up BE.  Discussed colonoscopy before surgery, but will wait for results of BE.  Will plan the colostomy reversal after mid August.  She wants to go to [facility] for the surgery.  I reviewed the surgery with her and the recovery.  Last time she went to a nursing home for 2 1/2 weeks after leaving the hospital.  Plan: 1) BE, 2) review BE results with patient and schedule surgery, 3) possible colonoscopy  [The colonoscopy shows about 20 cm of distal colon.  Her husband has been in the hospital with a foot infection.  But she is ready to get the colostomy reversed.  I talked to her by phone - 05/19/2014.  DN]  2. Has pacemaker (originally placed 2000, replaced 2008)  Saw Dr. Rollene Fare.    Now followed by Dr. Arelia Sneddon Croitoro.  He saw her 01/19/2014 and okayed her for surgery.  They did talk about anticoagulation, but are not going to pursue it at this time. She is doing well from a heart standpoint.   3. Hypertension  HISTORY OF PRESENT ILLNESS: Chief Complaint  Patient presents with  . Routine Post Op    colostomy    Kathryn [name] is a 80 y.o. (DOB: 27-Jan-1928)  white  female who is a patient of PERINI,MARK A, MD and comes to me today for follow up of perforated colon. She comes by herself. She is doing well with her colostomy  She talked about how the size of her colostomy has decreased.  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 She is eating well and feeling well.  Her husband has had trouble with swelling of his lower extremities.   He sees Dr. Virgina Jock. We discussed colostomy reversal, the hospitalization, and the post op course.  She went to a nursing/rehab for 2 and 1/2 weeks after her last surgery. Also, she has never had a colonoscopy.  She tells of trouble she had when Dr. Dyann Ruddle did a sigmoidoscopy.  I told her we would get the BE and go from there.  Past Medical History  Diagnosis Date  . MI, old 32  . DVT (deep venous thrombosis) 2010  . Bradycardia   . Syncope     permanent pacemaker 2008   Review of Systems Cardiology:  Followed by Dr. Arelia Sneddon Croitoro.  He saw her 01/19/2014 and okayed her for surgery. ENT:  Has seen brother [name] for wax in her ears.  SOCIAL HISTORY: Married.  Her husband has vascular problems and is seeing Dr. Donnetta Hutching. She has a son, [name]. She has 3 sons.  One lives in [place] and one lives i [place], [place].  PHYSICAL EXAM: BP 126/80  Pulse 76  Temp(Src) 98 F (36.7 C)  Resp 18  Ht 5\' 7"  (1.702 m)  Wt 122 lb (55.339 kg)  BMI 19.10 kg/m2  General: WN thin WF who is alert.   HEENT: Normal. Pupils equal.  Neck: Supple. No mass.  No thyroid mass.   Lymph  Nodes:  No supraclavicular or cervical nodes. Lungs: Clear to auscultation and symmetric breath sounds. Heart:  RRR. No murmur or rub.  Has a pacemaker in the right upper chest. Abdomen: Soft. Lower midline wound is healed. Extremities:  Good strength and ROM  in upper and lower extremities.  DATA REVIEWED: Epic notes and labs.  Alphonsa Overall, MD,  [facility], [address]  [place], [place]    [place] Phone:  [phone] FAX:  [phone]

## 2014-02-02 ENCOUNTER — Ambulatory Visit (HOSPITAL_COMMUNITY)
Admission: RE | Admit: 2014-02-02 | Discharge: 2014-02-02 | Disposition: A | Discharge status: Home / Self Care | Payer: Medicare Other | Source: Ambulatory Visit | Attending: Surgery | Admitting: Surgery

## 2014-02-02 ENCOUNTER — Other Ambulatory Visit (INDEPENDENT_AMBULATORY_CARE_PROVIDER_SITE_OTHER): Payer: Self-pay | Admitting: Surgery

## 2014-02-02 DIAGNOSIS — Z9049 Acquired absence of other specified parts of digestive tract: Secondary | ICD-10-CM | POA: Insufficient documentation

## 2014-02-02 DIAGNOSIS — Z9889 Other specified postprocedural states: Secondary | ICD-10-CM

## 2014-02-02 DIAGNOSIS — K5730 Diverticulosis of large intestine without perforation or abscess without bleeding: Secondary | ICD-10-CM | POA: Diagnosis not present

## 2014-02-02 DIAGNOSIS — Z933 Colostomy status: Secondary | ICD-10-CM | POA: Diagnosis not present

## 2014-02-03 ENCOUNTER — Other Ambulatory Visit (INDEPENDENT_AMBULATORY_CARE_PROVIDER_SITE_OTHER): Payer: Self-pay | Admitting: Surgery

## 2014-02-18 ENCOUNTER — Telehealth (INDEPENDENT_AMBULATORY_CARE_PROVIDER_SITE_OTHER): Payer: Self-pay | Admitting: Obstetrics and Gynecology

## 2014-02-18 NOTE — Telephone Encounter (Signed)
Glenda called patient to advise about surgery 02/19/14 per Dr. Lucia Gaskins, patient refused surgery date.

## 2014-03-02 ENCOUNTER — Telehealth (INDEPENDENT_AMBULATORY_CARE_PROVIDER_SITE_OTHER): Payer: Self-pay | Admitting: Surgery

## 2014-03-02 ENCOUNTER — Telehealth (INDEPENDENT_AMBULATORY_CARE_PROVIDER_SITE_OTHER): Payer: Self-pay

## 2014-03-02 ENCOUNTER — Encounter (HOSPITAL_COMMUNITY): Admission: RE | Payer: Self-pay | Source: Ambulatory Visit

## 2014-03-02 ENCOUNTER — Ambulatory Visit (HOSPITAL_COMMUNITY): Admission: RE | Admit: 2014-03-02 | Payer: Medicare Other | Source: Ambulatory Visit | Admitting: Surgery

## 2014-03-02 SURGERY — COLONOSCOPY
Anesthesia: LOCAL

## 2014-03-02 NOTE — Telephone Encounter (Signed)
Talked to patient about colonoscopy this Friday, 03/05/2014. 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  I called in Gol[place] prescription to [facility] [phone]).  I will see her Friday.  We will schedule the surgery after the colonoscopy findings.  Alphonsa Overall, MD, [facility] Surgery Pager: [phone] Office phone:  [phone]

## 2014-03-02 NOTE — Telephone Encounter (Signed)
Pt is scheduled for a colonoscopy on 8/28 with Dr Lucia Gaskins. Pt states that she has not gotten a colon prep at this time. Informed pt that I would send Dr Lucia Gaskins a message and we will call that into her pharmacy.  Pt would also like to make sure that she didn't need to do anything other than the colon prep. Pt verbalized understanding.

## 2014-03-05 ENCOUNTER — Ambulatory Visit (HOSPITAL_COMMUNITY)
Admission: RE | Admit: 2014-03-05 | Discharge: 2014-03-05 | Disposition: A | Discharge status: Home / Self Care | Payer: Medicare Other | Source: Ambulatory Visit | Attending: Surgery | Admitting: Surgery

## 2014-03-05 ENCOUNTER — Encounter (HOSPITAL_COMMUNITY): Payer: Self-pay

## 2014-03-05 ENCOUNTER — Other Ambulatory Visit (INDEPENDENT_AMBULATORY_CARE_PROVIDER_SITE_OTHER): Payer: Self-pay | Admitting: Surgery

## 2014-03-05 ENCOUNTER — Encounter (HOSPITAL_COMMUNITY)
Admission: RE | Disposition: A | Discharge status: Home / Self Care | Payer: Self-pay | Source: Ambulatory Visit | Attending: Surgery

## 2014-03-05 DIAGNOSIS — IMO0002 Reserved for concepts with insufficient information to code with codable children: Secondary | ICD-10-CM | POA: Insufficient documentation

## 2014-03-05 DIAGNOSIS — K9402 Colostomy infection: Secondary | ICD-10-CM | POA: Diagnosis not present

## 2014-03-05 DIAGNOSIS — Z950 Presence of cardiac pacemaker: Secondary | ICD-10-CM | POA: Diagnosis not present

## 2014-03-05 DIAGNOSIS — K5732 Diverticulitis of large intestine without perforation or abscess without bleeding: Secondary | ICD-10-CM | POA: Diagnosis not present

## 2014-03-05 DIAGNOSIS — Z933 Colostomy status: Secondary | ICD-10-CM

## 2014-03-05 DIAGNOSIS — Z86718 Personal history of other venous thrombosis and embolism: Secondary | ICD-10-CM | POA: Diagnosis not present

## 2014-03-05 DIAGNOSIS — D126 Benign neoplasm of colon, unspecified: Secondary | ICD-10-CM

## 2014-03-05 DIAGNOSIS — K5730 Diverticulosis of large intestine without perforation or abscess without bleeding: Secondary | ICD-10-CM | POA: Diagnosis present

## 2014-03-05 DIAGNOSIS — I252 Old myocardial infarction: Secondary | ICD-10-CM | POA: Insufficient documentation

## 2014-03-05 HISTORY — PX: COLONOSCOPY: SHX5424

## 2014-03-05 SURGERY — COLONOSCOPY
Anesthesia: Moderate Sedation

## 2014-03-05 MED ORDER — DIPHENHYDRAMINE HCL 50 MG/ML IJ SOLN
INTRAMUSCULAR | Status: AC
  Filled 2014-03-05: qty 1

## 2014-03-05 MED ORDER — MIDAZOLAM HCL 10 MG/2ML IJ SOLN
INTRAMUSCULAR | Status: AC
  Filled 2014-03-05: qty 2

## 2014-03-05 MED ORDER — FENTANYL CITRATE 0.05 MG/ML IJ SOLN
INTRAMUSCULAR | Status: AC
  Filled 2014-03-05: qty 2

## 2014-03-05 MED ORDER — FENTANYL CITRATE 0.05 MG/ML IJ SOLN
INTRAMUSCULAR | Status: DC | PRN
  Administered 2014-03-05 (×3): 25 ug via INTRAVENOUS

## 2014-03-05 MED ORDER — SODIUM CHLORIDE 0.9 % IV SOLN
INTRAVENOUS | Status: DC
  Administered 2014-03-05: 12:00:00 via INTRAVENOUS

## 2014-03-05 MED ORDER — MIDAZOLAM HCL 5 MG/5ML IJ SOLN
INTRAMUSCULAR | Status: DC | PRN
Start: 2014-03-05 — End: 2014-03-05
  Administered 2014-03-05: 2.5 mg via INTRAVENOUS
  Administered 2014-03-05: 2 mg via INTRAVENOUS
  Administered 2014-03-05: 2.5 mg via INTRAVENOUS

## 2014-03-05 NOTE — Op Note (Addendum)
03/05/2014  5:49 PM  PATIENT:  Kathryn [name], 80 y.o., female, MRN: [id number]  PREOP DIAGNOSIS:  Diverticular disease, end colostomy     POSTOP DIAGNOSIS:   Normal proximal colon except small polyp in cecum (80 cm), diversion colitis of distal colon with nodularity near staple line in sigmoid colon (the stapled end of the colon is 20 cm from anal verge)  PROCEDURE:   Procedure(s): COLONOSCOPY and removal of cecal polyp  SURGEON:   Alphonsa Overall, M.D.  ANESTHESIA:   IV sedation  No anesthesia staff entered.  Moderate Sedation  SPECIMEN:   Small cecal polyp  INDICATIONS FOR PROCEDURE:  Kathryn [name] is a 80 y.o. (DOB: 1927/07/12) white  female whose primary care physician is Jerlyn Ly, MD and comes for colonoscopy.  [name] had a sigmoid colectomy and end colostomy on 08/27/2013 for perforated diverticulitis.  We are evaluating her for reversal of her colostomy.   The indications and risks of colonoscopy were explained to the patient.  The risks include, but are not limited to, perforation of the bowel and bleeding.  OPERATIVE NOTE:  The patient was taken to room # 3 in the [facility] endoscopy suite.  The patient was monitored with pulse oximetry, blood pressure cuff, and cardiac monitor.  The had 2 liters of nasal O2 during the procedure.  A time out was held and the checklist reviewed.   The patient was sedated with 75 mcgm of Fentanyl and 7.5 mgm of Versed.   A digital rectal exam of her colostomy was done at the beginning of the procedure..  I advanced the Pentax colonoscope to about 75 to 80 cm and into the cecum.  The ileocecal valve was identified.  She had a single 6 mm polyp hanging in the cecum that I removed with cold biopsy forceps.   The colonic prep was fair.  There was a moderate amount of liquid material that washed away easily.   The right colon, transverse colon, left colon that I could see were unremarkable.    I then began the exam of the distal colon.  The anus and  rectum were unremarkable.  The flexible Pentax colonoscope was passed up the rectum without difficulty.  The scope was advanced to about 20 cm (early sigmoid colon) before finding the stapled colon.   There was nodularity around the staple line which I think was reactive from the surgery.  There was friability of the distal colon and it was pail consistet with diversion colitis.   The patient was taken to the recovery area of the [facility] endoscopy in good condition.  I called her son [name] on the phone.     At 86, there is a question whether she would ever need another colonoscopy.  We can wait to see how she does with the surgery for reversing her colostomy.  From here, I will schedule a reversal of her colostomy in 2 to 4 weeks.  I will see her in the office about 1 week before surgery to review the plans and give her her colon prep.  Alphonsa Overall, MD, [facility] Surgery Pager: [phone] Office phone:  [phone]

## 2014-03-05 NOTE — H&P (Signed)
Re: Kathryn [name]  DOB: 05/25/1928  MRN: [id number]   ASSESSMENT AND PLAN:  1. Sigmoid colectomy, Hartmann's pouch (emergency) - 08/28/2103 - D. Marshun Duva    For perforated diverticulitis of the sigmoid colon.  She is ready to get her colostomy reversed.  Will set up BE. Discussed colonoscopy before surgery, but will wait for results of BE. Will plan the colostomy reversal after mid August. She wants to go to [facility] surgery. I reviewed the surgery with her and the recovery. Last time she went to a nursing home for 2 1/2 weeks after leaving the hospital.   Plan: 1) BE, 2) review BE results with patient and schedule surgery, 3) possible colonoscopy   Son [name] was here.  His phone - [phone] 2. Has pacemaker (originally placed 2000, replaced 2008)   Saw Dr. Rollene Fare.  Now followed by Dr. Arelia Sneddon Croitoro. He saw her 01/19/2014 and okayed her for surgery. They did talk about anticoagulation, but are not going to pursue it at this time. She is doing well from a heart standpoint.  3. Hypertension   HISTORY OF PRESENT ILLNESS:  Chief Complaint   Patient presents with   .  Routine Post Op     colostomy    Kathryn [name] is a 80 y.o. (DOB: 21-Apr-1928) white female who is a patient of PERINI,MARK A, MD and comes to me today for follow up of perforated colon.  She comes by herself.  She is doing well with her colostomy She talked about how the size of her colostomy has decreased. 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  She is eating well and feeling well. Her husband has had trouble with swelling of his lower extremities. He sees Dr. Virgina Jock.  We discussed colostomy reversal, the hospitalization, and the post op course. She went to a nursing/rehab for 2 and 1/2 weeks after her last surgery.  Also, she has never had a colonoscopy. She tells of trouble she had when Dr. Dyann Ruddle did a sigmoidoscopy. I told her we would get the BE and go from there.   Past  Medical History   Diagnosis  Date   .  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  DVT (deep venous thrombosis)  2010   .  Bradycardia    .  Syncope      permanent pacemaker 2008    Review of Systems  Cardiology: Followed by Dr. Arelia Sneddon Croitoro. He saw her 01/19/2014 and okayed her for surgery.  ENT: Has seen brother [name] for wax in her ears.   SOCIAL HISTORY:  Married. Her husband has vascular problems and is seeing Dr. Donnetta Hutching.  She has a son, [name].  She has 3 sons. One lives in [place] and one lives i [place], [place].   PHYSICAL EXAM:  BP 126/80  Pulse 76  Temp(Src) 98 F (36.7 C)  Resp 18  Ht 5\' 7"  (1.702 m)  Wt 122 lb (55.339 kg)  BMI 19.10 kg/m2  General: WN thin WF who is alert.  HEENT: Normal. Pupils equal.  Neck: Supple. No mass. No thyroid mass.  Lymph Nodes: No supraclavicular or cervical nodes.  Lungs: Clear to auscultation and symmetric breath sounds.  Heart: RRR. No murmur or rub. Has a pacemaker in the right upper chest.  Abdomen: Soft. Lower midline wound is healed.  Extremities: Good strength and ROM in upper and lower extremities.   DATA REVIEWED:  Epic notes and  labs.  Alphonsa Overall, MD, [facility] Surgery, [address] [place], [place]  Phone: [phone] FAX: [phone]

## 2014-03-05 NOTE — Discharge Instructions (Signed)
We will schedule surgery in 3 to 6 weeks.  I will see you about one week before surgery to go over the operation and give you your pre op prep.Colonoscopy, Care After Refer to this sheet in the next few weeks. These instructions provide you with information on caring for yourself after your procedure. Your health care provider may also give you more specific instructions. Your treatment has been planned according to current medical practices, but problems sometimes occur. Call your health care provider if you have any problems or questions after your procedure. WHAT TO EXPECT AFTER THE PROCEDURE  After your procedure, it is typical to have the following:  A small amount of blood in your stool.  Moderate amounts of gas and mild abdominal cramping or bloating. HOME CARE INSTRUCTIONS  Do not drive, operate machinery, or sign important documents for 24 hours.  You may shower and resume your regular physical activities, but move at a slower pace for the first 24 hours.  Take frequent rest periods for the first 24 hours.  Walk around or put a warm pack on your abdomen to help reduce abdominal cramping and bloating.  Drink enough fluids to keep your urine clear or pale yellow.  You may resume your normal diet as instructed by your health care provider. Avoid heavy or fried foods that are hard to digest.  Avoid drinking alcohol for 24 hours or as instructed by your health care provider.  Only take over-the-counter or prescription medicines as directed by your health care provider.  If a tissue sample (biopsy) was taken during your procedure:  Do not take aspirin or blood thinners for 7 days, or as instructed by your health care provider.  Do not drink alcohol for 7 days, or as instructed by your health care provider.  Eat soft foods for the first 24 hours. SEEK MEDICAL CARE IF: You have persistent spotting of blood in your stool 2-3 days after the procedure. SEEK IMMEDIATE MEDICAL CARE  IF:  You have more than a small spotting of blood in your stool.  You pass large blood clots in your stool.  Your abdomen is swollen (distended).  You have nausea or vomiting.  You have a fever.  You have increasing abdominal pain that is not relieved with medicine. Document Released: 02/07/2004 Document Revised: 04/15/2013 Document Reviewed: 03/02/2013 [facility] Patient Information 2015 [place], [place]. This information is not intended to replace advice given to you by your health care provider. Make sure you discuss any questions you have with your health care provider. Conscious Sedation, Adult, Care After Refer to this sheet in the next few weeks. These instructions provide you with information on caring for yourself after your procedure. Your health care provider may also give you more specific instructions. Your treatment has been planned according to current medical practices, but problems sometimes occur. Call your health care provider if you have any problems or questions after your procedure. WHAT TO EXPECT AFTER THE PROCEDURE  After your procedure:  You may feel sleepy, clumsy, and have poor balance for several hours.  Vomiting may occur if you eat too soon after the procedure. HOME CARE INSTRUCTIONS  Do not participate in any activities where you could become injured for at least 24 hours. Do not:  Drive.  Swim.  Ride a bicycle.  Operate heavy machinery.  Cook.  Use power tools.  Climb ladders.  Work from a high place.  Do not make important decisions or sign legal documents until you are  improved.  If you vomit, drink water, juice, or soup when you can drink without vomiting. Make sure you have little or no nausea before eating solid foods.  Only take over-the-counter or prescription medicines for pain, discomfort, or fever as directed by your health care provider.  Make sure you and your family fully understand everything about the medicines given to you,  including what side effects may occur.  You should not drink alcohol, take sleeping pills, or take medicines that cause drowsiness for at least 24 hours.  If you smoke, do not smoke without supervision.  If you are feeling better, you may resume normal activities 24 hours after you were sedated.  Keep all appointments with your health care provider. SEEK MEDICAL CARE IF:  Your skin is pale or bluish in color.  You continue to feel nauseous or vomit.  Your pain is getting worse and is not helped by medicine.  You have bleeding or swelling.  You are still sleepy or feeling clumsy after 24 hours. SEEK IMMEDIATE MEDICAL CARE IF:  You develop a rash.  You have difficulty breathing.  You develop any type of allergic problem.  You have a fever. MAKE SURE YOU:  Understand these instructions.  Will watch your condition.  Will get help right away if you are not doing well or get worse. Document Released: 04/15/2013 Document Reviewed: 04/15/2013 [facility] Patient Information 2015 [place], [place]. This information is not intended to replace advice given to you by your health care provider. Make sure you discuss any questions you have with your health care provider.

## 2014-03-08 ENCOUNTER — Encounter (HOSPITAL_COMMUNITY): Payer: Self-pay | Admitting: Surgery

## 2014-04-22 ENCOUNTER — Telehealth: Payer: Self-pay | Admitting: Cardiovascular Disease

## 2014-04-22 ENCOUNTER — Encounter: Payer: Medicare Other | Admitting: *Deleted

## 2014-04-22 NOTE — Telephone Encounter (Signed)
Pt wants to know if you received her pacemaker transmittal?

## 2014-04-22 NOTE — Telephone Encounter (Signed)
Informed pt that the transmission was not received. She states that she will try again in the am bc she will be leaving out this pm. I told pt this would be fine and she could just give [place] a call tomorrow to see if it works. Pt voiced understanding.

## 2014-04-23 ENCOUNTER — Encounter: Payer: Self-pay | Admitting: Cardiology

## 2014-04-23 NOTE — Telephone Encounter (Signed)
Left message with husband and informed him that I would call pt back on Monday.

## 2014-04-26 ENCOUNTER — Telehealth: Payer: Self-pay | Admitting: Cardiovascular Disease

## 2014-04-26 NOTE — Telephone Encounter (Signed)
Spoke with pt and helped trouble shoot monitor pt is going to try and resend transmission. If it does not work I will give pt tech service number to call. She agreed with this plan.

## 2014-04-26 NOTE — Telephone Encounter (Signed)
I spoke with pt earlier today. Pt aware that she needs to call tech service to receive help trouble shooting monitor. I attempted but was unsuccessful.

## 2014-04-26 NOTE — Telephone Encounter (Signed)
Please call,concerning transmitting her pacemaker.Returning your call from Friday.

## 2014-04-26 NOTE — Telephone Encounter (Signed)
Transmission would not go through gave pt tech service number to call.

## 2014-04-26 NOTE — Telephone Encounter (Signed)
This notes should go to somebody named Pamala Hurry. I do not know who this is. If you know would you please forward to her. I do not think it was [employer].

## 2014-04-27 ENCOUNTER — Telehealth: Payer: Self-pay | Admitting: Cardiology

## 2014-04-27 NOTE — Telephone Encounter (Signed)
Pt called and stated that she called tech services and they stated that she needed a new home monitor. She will send transmission when she receives new home monitor.

## 2014-04-30 ENCOUNTER — Telehealth: Payer: Self-pay | Admitting: Cardiovascular Disease

## 2014-04-30 NOTE — Telephone Encounter (Signed)
New message    FYI Pt says her remote monitor is not working.  The company is going to send her another box.  She got a letter from [place] stating we have not received her remote checks.

## 2014-04-30 NOTE — Telephone Encounter (Signed)
Noted in [employer]. 

## 2014-05-13 ENCOUNTER — Telehealth: Payer: Self-pay | Admitting: *Deleted

## 2014-05-13 NOTE — Telephone Encounter (Signed)
Signed perioperative Rx for implanted cardiac device programming for Medtronic device for surgical procedure 05/21/14 faxed to [facility] 05/11/14.

## 2014-05-14 ENCOUNTER — Telehealth: Payer: Self-pay | Admitting: *Deleted

## 2014-05-14 ENCOUNTER — Encounter: Payer: Self-pay | Admitting: Internal Medicine

## 2014-05-14 ENCOUNTER — Encounter (HOSPITAL_COMMUNITY): Payer: Self-pay

## 2014-05-14 NOTE — Telephone Encounter (Signed)
Patient informed that new monitor was ordered.

## 2014-05-17 ENCOUNTER — Telehealth (INDEPENDENT_AMBULATORY_CARE_PROVIDER_SITE_OTHER): Payer: Self-pay

## 2014-05-17 ENCOUNTER — Encounter (HOSPITAL_COMMUNITY): Payer: Self-pay

## 2014-05-17 ENCOUNTER — Encounter (HOSPITAL_COMMUNITY)
Admission: RE | Admit: 2014-05-17 | Discharge: 2014-05-17 | Disposition: A | Discharge status: Home / Self Care | Payer: Medicare Other | Source: Ambulatory Visit | Attending: Surgery | Admitting: Surgery

## 2014-05-17 DIAGNOSIS — Z01812 Encounter for preprocedural laboratory examination: Secondary | ICD-10-CM | POA: Insufficient documentation

## 2014-05-17 HISTORY — DX: Cardiac arrhythmia, unspecified: I49.9

## 2014-05-17 HISTORY — DX: Gastro-esophageal reflux disease without esophagitis: K21.9

## 2014-05-17 HISTORY — DX: Unspecified osteoarthritis, unspecified site: M19.90

## 2014-05-17 HISTORY — DX: Presence of cardiac pacemaker: Z95.0

## 2014-05-17 HISTORY — DX: Essential (primary) hypertension: I10

## 2014-05-17 LAB — BASIC METABOLIC PANEL
Anion gap: 10 (ref 5–15)
BUN: 21 mg/dL (ref 6–23)
CO2: 27 mEq/L (ref 19–32)
Calcium: 10.1 mg/dL (ref 8.4–10.5)
Chloride: 101 mEq/L (ref 96–112)
Creatinine, Ser: 1.11 mg/dL — ABNORMAL HIGH (ref 0.50–1.10)
GFR calc Af Amer: 52 mL/min — ABNORMAL LOW (ref >90)
GFR calc non Af Amer: 45 mL/min — ABNORMAL LOW (ref >90)
GLUCOSE: 87 mg/dL (ref 70–99)
POTASSIUM: 4.8 meq/L (ref 3.7–5.3)
Sodium: 138 mEq/L (ref 137–147)

## 2014-05-17 LAB — CBC
HEMATOCRIT: 35.7 % — AB (ref 36.0–46.0)
HEMOGLOBIN: 11.5 g/dL — AB (ref 12.0–15.0)
MCH: 29.9 pg (ref 26.0–34.0)
MCHC: 32.2 g/dL (ref 30.0–36.0)
MCV: 93 fL (ref 78.0–100.0)
Platelets: 262 10*3/uL (ref 150–400)
RBC: 3.84 MIL/uL — ABNORMAL LOW (ref 3.87–5.11)
RDW: 13 % (ref 11.5–15.5)
WBC: 7.2 10*3/uL (ref 4.0–10.5)

## 2014-05-17 LAB — HEMOGLOBIN A1C
HEMOGLOBIN A1C: 6.1 % — AB (ref <5.7)
Mean Plasma Glucose: 128 mg/dL — ABNORMAL HIGH (ref <117)

## 2014-05-17 NOTE — Patient Instructions (Addendum)
STOP ASPIRIN AND HERBALS 7 DAYS BEFORE SURGERY- PER ORDERS DR. Lucia Gaskins   YOUR SURGERY IS SCHEDULED AT [facility]  ON:   Friday  November 13  REPORT TO  SHORT STAY CENTER AT:  5:30 AM   DO NOT EAT OR DRINK ANYTHING AFTER MIDNIGHT THE NIGHT BEFORE YOUR SURGERY.  YOU MAY BRUSH YOUR TEETH, RINSE OUT YOUR MOUTH--BUT NO WATER, NO FOOD, NO CHEWING GUM, NO MINTS, NO CANDIES, NO CHEWING TOBACCO.  PLEASE TAKE THE FOLLOWING MEDICATIONS THE AM OF YOUR SURGERY WITH A FEW SIPS OF WATER:  METOPROLOL    DO NOT BRING VALUABLES, MONEY, CREDIT CARDS.  DO NOT WEAR JEWELRY, MAKE-UP, NAIL POLISH AND NO METAL PINS OR CLIPS IN YOUR HAIR. CONTACT LENS, DENTURES / PARTIALS, GLASSES SHOULD NOT BE WORN TO SURGERY AND IN MOST CASES-HEARING AIDS WILL NEED TO BE REMOVED.  BRING YOUR GLASSES CASE, ANY EQUIPMENT NEEDED FOR YOUR CONTACT LENS. FOR PATIENTS ADMITTED TO THE HOSPITAL--CHECK OUT TIME THE DAY OF DISCHARGE IS 11:00 AM.  ALL INPATIENT ROOMS ARE PRIVATE - WITH BATHROOM, TELEPHONE, TELEVISION AND WIFI INTERNET.    PLEASE BE AWARE THAT YOU MAY NEED ADDITIONAL BLOOD DRAWN DAY OF YOUR SURGERY  _______________________________________________________________________   [facility] - Preparing for Surgery Before surgery, you can play an important role.  Because skin is not sterile, your skin needs to be as free of germs as possible.  You can reduce the number of germs on your skin by washing with CHG (chlorahexidine gluconate) soap before surgery.  CHG is an antiseptic cleaner which kills germs and bonds with the skin to continue killing germs even after washing. Please DO NOT use if you have an allergy to CHG or antibacterial soaps.  If your skin becomes reddened/irritated stop using the CHG and inform your nurse when you arrive at Short Stay. Do not shave (including legs and underarms) for at least 48 hours prior to the first CHG shower.  You may shave your face/neck. Please follow these instructions  carefully:  1.  Shower with CHG Soap the night before surgery and the  morning of Surgery.  2.  If you choose to wash your hair, wash your hair first as usual with your  normal  shampoo.  3.  After you shampoo, rinse your hair and body thoroughly to remove the  shampoo.                           4.  Use CHG as you would any other liquid soap.  You can apply chg directly  to the skin and wash                       Gently with a scrungie or clean washcloth.  5.  Apply the CHG Soap to your body ONLY FROM THE NECK DOWN.   Do not use on face/ open                           Wound or open sores. Avoid contact with eyes, ears mouth and genitals (private parts).                       Wash face,  Genitals (private parts) with your normal soap.             6.  Wash thoroughly, paying special attention to the area where your surgery  will be performed.  7.  Thoroughly rinse your body with warm water from the neck down.  8.  DO NOT shower/wash with your normal soap after using and rinsing off  the CHG Soap.                9.  Pat yourself dry with a clean towel.            10.  Wear clean pajamas.            11.  Place clean sheets on your bed the night of your first shower and do not  sleep with pets. Day of Surgery : Do not apply any lotions/deodorants the morning of surgery.  Please wear clean clothes to the hospital/surgery center.  FAILURE TO FOLLOW THESE INSTRUCTIONS MAY RESULT IN THE CANCELLATION OF YOUR SURGERY PATIENT SIGNATURE_________________________________  NURSE SIGNATURE__________________________________  ________________________________________________________________________

## 2014-05-17 NOTE — Pre-Procedure Instructions (Signed)
EKG REPORT AND CARDIOLOGY OFFICE NOTE ARE IN EPIC 01-19-14 VISIT WITH DR. Sallyanne Kuster.  DR. CROITORU GAVE CARDIAC CLEARANCE FOR SURGERY IN  HIS NOTES. PERIOPERATIVE PRESCRIPTION FOR IMPLANTED CARDIAC DEVICE ORDERS ARE ON CHART - NODEVICE REPROGRAMMING OR MAGNET PLACEMENT NEEDED PER DR. Sallyanne Kuster. NOTED ON OR SCHEDULE THAT PT HAS PACEMAKER. CXR WAS DONE 08/27/13 - REPORT IN EPIC. PT'S CHART WAS GIVEN TO FOLLOW UP NURSE BEVERLY NANNY FOR REVIEW OF ALL  PREOP TESTING.

## 2014-05-17 NOTE — Telephone Encounter (Signed)
Kathryn [name] at [facility] called.  Pt is scheduled for colostomy closure on 07/21/1927. She says she has no instructions on bowel prep.  Also, she had colonoscopy in Aug or Sept and wonders if this is recent enough for you.  Please advise.  pt phone [phone].  Thank you.  SL.  message also to be put in Epic.

## 2014-05-18 ENCOUNTER — Telehealth (INDEPENDENT_AMBULATORY_CARE_PROVIDER_SITE_OTHER): Payer: Self-pay

## 2014-05-18 LAB — TYPE AND SCREEN
ABO/RH(D): B NEG
ANTIBODY SCREEN: POSITIVE
DAT, IGG: NEGATIVE

## 2014-05-18 NOTE — Progress Notes (Signed)
[facility] routed via EPIC to Dr Nydia Bouton via EPIC.

## 2014-05-18 NOTE — Telephone Encounter (Signed)
Called pt to give her bowel prep instructions and called prescription to pharmacy per Doctor instructions. Pt verbalized understanding and read instructions back. She not taking aspirin and will be on clear liquids the day before the surgery.  Pharmacist will give Dulcolax a\nd Miralax as well as Neomycin and Flagyl.  Note also in [employer].

## 2014-05-20 ENCOUNTER — Encounter (HOSPITAL_COMMUNITY): Payer: Self-pay | Admitting: Anesthesiology

## 2014-05-20 NOTE — Anesthesia Preprocedure Evaluation (Addendum)
Anesthesia Evaluation  Patient identified by MRN, date of birth, ID band Patient awake    Reviewed: Allergy & Precautions, H&P , NPO status , Patient's Chart, lab work & pertinent test results  Airway Mallampati: II  TM Distance: >3 FB Neck ROM: Full    Dental no notable dental hx.    Pulmonary neg pulmonary ROS,  breath sounds clear to auscultation  Pulmonary exam normal       Cardiovascular Exercise Tolerance: Good hypertension, Pt. on medications and Pt. on home beta blockers + dysrhythmias Atrial Fibrillation + pacemaker Rhythm:Regular Rate:Normal  Last cardiology office visit reviewed. H/O complete heart block. Paroxysmal atrial fibrillation. Medtronic pacemaker prescription reviewed.  No current cardiopulmonary symptoms.   Neuro/Psych negative neurological ROS  negative psych ROS   GI/Hepatic Neg liver ROS, GERD-  ,  Endo/Other  negative endocrine ROS  Renal/GU negative Renal ROS  negative genitourinary   Musculoskeletal  (+) Arthritis -,   Abdominal   Peds negative pediatric ROS (+)  Hematology negative hematology ROS (+)   Anesthesia Other Findings   Reproductive/Obstetrics negative OB ROS                            Anesthesia Physical Anesthesia Plan  ASA: III  Anesthesia Plan: General   Post-op Pain Management:    Induction: Intravenous  Airway Management Planned: Oral ETT  Additional Equipment:   Intra-op Plan:   Post-operative Plan: Extubation in OR  Informed Consent: I have reviewed the patients History and Physical, chart, labs and discussed the procedure including the risks, benefits and alternatives for the proposed anesthesia with the patient or authorized representative who has indicated his/her understanding and acceptance.   Dental advisory given  Plan Discussed with: CRNA  Anesthesia Plan Comments:         Anesthesia Quick Evaluation

## 2014-05-20 NOTE — H&P (Signed)
  Re: GENIYAH [name] DOB: [birth year] MRN: [id number]  ASSESSMENT AND PLAN: 1. Sigmoid colectomy, Hartmann's pouch (emergency) - 08/28/2103 - D. Mitchelle Goerner For perforated diverticulitis of the sigmoid colon. She is ready to get her colostomy reversed.  BE - 02/02/2014 - Normal appearance of the colonic remnant and the Hartmann's pouch. The colonoscopy shows about 20 cm of distal colon. Her husband has been in the hospital with a foot infection. But she is ready to get the colostomy reversed.  DN  She is scheduled for surgery 05/21/2014 - I have reviewed the surgery, hospital course and recovery, and the risks.  2. Has pacemaker (originally placed 2000, replaced 2008) Saw Dr. Rollene Fare.  Now followed by Dr. Arelia Sneddon Croitoro. He saw her 01/19/2014 and okayed her for surgery. They did talk about anticoagulation, but are not going to pursue it at this time. She is doing well from a heart standpoint.  3. Hypertension  HISTORY OF PRESENT ILLNESS: Chief Complaint  Patient presents with  . Routine Post Op    colostomy    Kathryn [name] is a 80 y.o. (DOB: October 09, 1927) white female who is a patient of PERINI,MARK A, MD and comes to me today for follow up of perforated colon. She comes by herself.  She is doing well with her colostomy She talked about how the size of her colostomy has decreased. 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 She is eating well and feeling well. Her husband has had trouble with swelling of his lower extremities. He sees Dr. Virgina Jock.  We discussed colostomy reversal, the hospitalization, and the post op course. She went to a nursing/rehab for 2 and 1/2 weeks after her last surgery. Also, she has never had a colonoscopy. She tells of trouble she had when Dr. Dyann Ruddle did a sigmoidoscopy. I told her we would get the BE and go from  there.  Past Medical History  Diagnosis Date  . MI, old 12  . DVT (deep venous thrombosis) 2010  . Bradycardia   . Syncope     permanent pacemaker 2008   Review of Systems Cardiology: Followed by Dr. Arelia Sneddon Croitoro. He saw her 01/19/2014 and okayed her for surgery. ENT: Has seen brother [name] for wax in her ears.  SOCIAL HISTORY: Married. Her husband has vascular problems and is seeing Dr. Donnetta Hutching. She has a son, [name]. She has 3 sons. One lives in [place] and one lives i [place], [place].  PHYSICAL EXAM: BP 126/80  Pulse 76  Temp(Src) 98 F (36.7 C)  Resp 18  Ht 5\' 7"  (1.702 m)  Wt 122 lb (55.339 kg)  BMI 19.10 kg/m2  General: WN thin WF who is alert.  HEENT: Normal. Pupils equal.  Neck: Supple. No mass. No thyroid mass.  Lymph Nodes: No supraclavicular or cervical nodes. Lungs: Clear to auscultation and symmetric breath sounds. Heart: RRR. No murmur or rub. Has a pacemaker in the right upper chest. Abdomen: Soft. Lower midline wound is healed. Extremities: Good strength and ROM in upper and lower extremities.  DATA REVIEWED: Epic notes and labs.  Alphonsa Overall, MD, [facility] Surgery, [address] [place], [place] Phone: [phone]: [phone]

## 2014-05-21 ENCOUNTER — Inpatient Hospital Stay (HOSPITAL_COMMUNITY): Payer: Medicare Other | Admitting: Anesthesiology

## 2014-05-21 ENCOUNTER — Encounter (HOSPITAL_COMMUNITY)
Admission: RE | Disposition: A | Discharge status: Home Health | Payer: Self-pay | Source: Ambulatory Visit | Attending: Surgery

## 2014-05-21 ENCOUNTER — Encounter (HOSPITAL_COMMUNITY): Payer: Self-pay | Admitting: *Deleted

## 2014-05-21 ENCOUNTER — Inpatient Hospital Stay (HOSPITAL_COMMUNITY)
Admission: RE | Admit: 2014-05-21 | Discharge: 2014-05-27 | DRG: 330 | Disposition: A | Discharge status: Home Health | Payer: Medicare Other | Source: Ambulatory Visit | Attending: Surgery | Admitting: Surgery

## 2014-05-21 DIAGNOSIS — Z950 Presence of cardiac pacemaker: Secondary | ICD-10-CM | POA: Diagnosis not present

## 2014-05-21 DIAGNOSIS — D500 Iron deficiency anemia secondary to blood loss (chronic): Secondary | ICD-10-CM | POA: Diagnosis present

## 2014-05-21 DIAGNOSIS — K660 Peritoneal adhesions (postprocedural) (postinfection): Secondary | ICD-10-CM | POA: Diagnosis present

## 2014-05-21 DIAGNOSIS — Z4689 Encounter for fitting and adjustment of other specified devices: Secondary | ICD-10-CM

## 2014-05-21 DIAGNOSIS — I252 Old myocardial infarction: Secondary | ICD-10-CM

## 2014-05-21 DIAGNOSIS — Z86718 Personal history of other venous thrombosis and embolism: Secondary | ICD-10-CM

## 2014-05-21 DIAGNOSIS — I10 Essential (primary) hypertension: Secondary | ICD-10-CM | POA: Diagnosis present

## 2014-05-21 DIAGNOSIS — Z01812 Encounter for preprocedural laboratory examination: Secondary | ICD-10-CM

## 2014-05-21 DIAGNOSIS — Z433 Encounter for attention to colostomy: Secondary | ICD-10-CM | POA: Diagnosis present

## 2014-05-21 DIAGNOSIS — Z9889 Other specified postprocedural states: Secondary | ICD-10-CM

## 2014-05-21 DIAGNOSIS — Z933 Colostomy status: Secondary | ICD-10-CM

## 2014-05-21 DIAGNOSIS — D62 Acute posthemorrhagic anemia: Secondary | ICD-10-CM | POA: Diagnosis not present

## 2014-05-21 HISTORY — PX: COLOSTOMY CLOSURE: SHX1381

## 2014-05-21 LAB — SURGICAL PCR SCREEN
MRSA, PCR: NEGATIVE
STAPHYLOCOCCUS AUREUS: NEGATIVE

## 2014-05-21 SURGERY — COLOSTOMY CLOSURE
Anesthesia: General | Site: Abdomen

## 2014-05-21 MED ORDER — ATROPINE SULFATE 0.4 MG/ML IJ SOLN
INTRAMUSCULAR | Status: AC
  Filled 2014-05-21: qty 1

## 2014-05-21 MED ORDER — SODIUM CHLORIDE 0.9 % IJ SOLN
INTRAMUSCULAR | Status: AC
  Filled 2014-05-21: qty 10

## 2014-05-21 MED ORDER — GLYCOPYRROLATE 0.2 MG/ML IJ SOLN
INTRAMUSCULAR | Status: DC | PRN
  Administered 2014-05-21: .4 mg via INTRAVENOUS

## 2014-05-21 MED ORDER — LIDOCAINE HCL (CARDIAC) 20 MG/ML IV SOLN
INTRAVENOUS | Status: DC | PRN
  Administered 2014-05-21: 50 mg via INTRAVENOUS

## 2014-05-21 MED ORDER — CHLORHEXIDINE GLUCONATE 4 % EX LIQD: 60.0000 mL | Freq: Once | CUTANEOUS | Status: DC

## 2014-05-21 MED ORDER — ONDANSETRON HCL 4 MG/2ML IJ SOLN
INTRAMUSCULAR | Status: DC | PRN
  Administered 2014-05-21 (×2): 2 mg via INTRAVENOUS
  Administered 2014-05-21: 4 mg via INTRAVENOUS

## 2014-05-21 MED ORDER — ONDANSETRON HCL 4 MG/2ML IJ SOLN
INTRAMUSCULAR | Status: AC
  Filled 2014-05-21: qty 2

## 2014-05-21 MED ORDER — HEPARIN SODIUM (PORCINE) 5000 UNIT/ML IJ SOLN
5000.0000 [IU] | Freq: Three times a day (TID) | INTRAMUSCULAR | Status: DC
  Administered 2014-05-21 – 2014-05-27 (×15): 5000 [IU] via SUBCUTANEOUS
  Filled 2014-05-21 (×19): qty 1

## 2014-05-21 MED ORDER — EPHEDRINE SULFATE 50 MG/ML IJ SOLN
INTRAMUSCULAR | Status: AC
  Filled 2014-05-21: qty 1

## 2014-05-21 MED ORDER — DEXTROSE 5 % IV SOLN
2.0000 g | INTRAVENOUS | Status: AC
  Administered 2014-05-21: 2 g via INTRAVENOUS
  Filled 2014-05-21: qty 2

## 2014-05-21 MED ORDER — PROPOFOL 10 MG/ML IV BOLUS
INTRAVENOUS | Status: AC
  Filled 2014-05-21: qty 20

## 2014-05-21 MED ORDER — FENTANYL CITRATE 0.05 MG/ML IJ SOLN
25.0000 ug | INTRAMUSCULAR | Status: DC | PRN
  Administered 2014-05-21: 50 ug via INTRAVENOUS

## 2014-05-21 MED ORDER — 0.9 % SODIUM CHLORIDE (POUR BTL) OPTIME
TOPICAL | Status: DC | PRN
  Administered 2014-05-21: 2000 mL

## 2014-05-21 MED ORDER — NEOSTIGMINE METHYLSULFATE 10 MG/10ML IV SOLN
INTRAVENOUS | Status: DC | PRN
  Administered 2014-05-21: 3 mg via INTRAVENOUS

## 2014-05-21 MED ORDER — GLYCOPYRROLATE 0.2 MG/ML IJ SOLN
INTRAMUSCULAR | Status: AC
  Filled 2014-05-21: qty 2

## 2014-05-21 MED ORDER — CEFOTETAN DISODIUM-DEXTROSE 2-2.08 GM-% IV SOLR
INTRAVENOUS | Status: AC
  Filled 2014-05-21: qty 50

## 2014-05-21 MED ORDER — CISATRACURIUM BESYLATE 20 MG/10ML IV SOLN
INTRAVENOUS | Status: AC
  Filled 2014-05-21: qty 10

## 2014-05-21 MED ORDER — MORPHINE SULFATE 2 MG/ML IJ SOLN
1.0000 mg | INTRAMUSCULAR | Status: DC | PRN
  Administered 2014-05-21 – 2014-05-26 (×19): 2 mg via INTRAVENOUS
  Filled 2014-05-21 (×13): qty 1
  Filled 2014-05-21: qty 2
  Filled 2014-05-21 (×5): qty 1

## 2014-05-21 MED ORDER — METOPROLOL TARTRATE 1 MG/ML IV SOLN
2.5000 mg | Freq: Three times a day (TID) | INTRAVENOUS | Status: DC
  Administered 2014-05-21 – 2014-05-26 (×15): 2.5 mg via INTRAVENOUS
  Filled 2014-05-21 (×18): qty 5

## 2014-05-21 MED ORDER — ACETAMINOPHEN 10 MG/ML IV SOLN
1000.0000 mg | Freq: Once | INTRAVENOUS | Status: AC
  Administered 2014-05-21: 1000 mg via INTRAVENOUS
  Filled 2014-05-21: qty 100

## 2014-05-21 MED ORDER — ONDANSETRON HCL 4 MG/2ML IJ SOLN
INTRAMUSCULAR | Status: AC
Start: 2014-05-21 — End: 2014-05-21
  Filled 2014-05-21: qty 2

## 2014-05-21 MED ORDER — LACTATED RINGERS IV SOLN
INTRAVENOUS | Status: DC | PRN
  Administered 2014-05-21 (×3): via INTRAVENOUS

## 2014-05-21 MED ORDER — MIDAZOLAM HCL 5 MG/5ML IJ SOLN
INTRAMUSCULAR | Status: DC | PRN
  Administered 2014-05-21 (×2): .5 mg via INTRAVENOUS

## 2014-05-21 MED ORDER — MIDAZOLAM HCL 2 MG/2ML IJ SOLN
INTRAMUSCULAR | Status: AC
  Filled 2014-05-21: qty 2

## 2014-05-21 MED ORDER — FENTANYL CITRATE 0.05 MG/ML IJ SOLN
INTRAMUSCULAR | Status: AC
  Filled 2014-05-21: qty 2

## 2014-05-21 MED ORDER — PHENOL 1.4 % MT LIQD
1.0000 | OROMUCOSAL | Status: DC | PRN
  Administered 2014-05-21: 1 via OROMUCOSAL
  Filled 2014-05-21 (×2): qty 177

## 2014-05-21 MED ORDER — KCL IN DEXTROSE-NACL 20-5-0.45 MEQ/L-%-% IV SOLN
INTRAVENOUS | Status: DC
  Administered 2014-05-21 – 2014-05-24 (×9): via INTRAVENOUS
  Administered 2014-05-25: 75 mL/h via INTRAVENOUS
  Administered 2014-05-25: 20:00:00 via INTRAVENOUS
  Filled 2014-05-21 (×13): qty 1000

## 2014-05-21 MED ORDER — FENTANYL CITRATE 0.05 MG/ML IJ SOLN
INTRAMUSCULAR | Status: AC
  Filled 2014-05-21: qty 5

## 2014-05-21 MED ORDER — FENTANYL CITRATE 0.05 MG/ML IJ SOLN
INTRAMUSCULAR | Status: DC | PRN
  Administered 2014-05-21 (×6): 50 ug via INTRAVENOUS

## 2014-05-21 MED ORDER — CISATRACURIUM BESYLATE (PF) 10 MG/5ML IV SOLN
INTRAVENOUS | Status: DC | PRN
  Administered 2014-05-21: 6 mg via INTRAVENOUS
  Administered 2014-05-21 (×2): 2 mg via INTRAVENOUS
  Administered 2014-05-21: 4 mg via INTRAVENOUS

## 2014-05-21 MED ORDER — BUPIVACAINE HCL (PF) 0.5 % IJ SOLN
INTRAMUSCULAR | Status: AC
  Filled 2014-05-21: qty 30

## 2014-05-21 MED ORDER — HEPARIN SODIUM (PORCINE) 5000 UNIT/ML IJ SOLN
5000.0000 [IU] | Freq: Once | INTRAMUSCULAR | Status: AC
  Administered 2014-05-21: 5000 [IU] via SUBCUTANEOUS
  Filled 2014-05-21: qty 1

## 2014-05-21 MED ORDER — DEXAMETHASONE SODIUM PHOSPHATE 10 MG/ML IJ SOLN
INTRAMUSCULAR | Status: AC
  Filled 2014-05-21: qty 1

## 2014-05-21 MED ORDER — ALVIMOPAN 12 MG PO CAPS
12.0000 mg | ORAL_CAPSULE | Freq: Once | ORAL | Status: AC
  Administered 2014-05-21: 12 mg via ORAL
  Filled 2014-05-21: qty 1

## 2014-05-21 MED ORDER — SUCCINYLCHOLINE CHLORIDE 20 MG/ML IJ SOLN
INTRAMUSCULAR | Status: DC | PRN
  Administered 2014-05-21: 100 mg via INTRAVENOUS

## 2014-05-21 MED ORDER — ONDANSETRON HCL 4 MG PO TABS: 4.0000 mg | ORAL_TABLET | Freq: Four times a day (QID) | ORAL | Status: DC | PRN

## 2014-05-21 MED ORDER — PROPOFOL 10 MG/ML IV BOLUS
INTRAVENOUS | Status: DC | PRN
  Administered 2014-05-21: 125 mg via INTRAVENOUS

## 2014-05-21 MED ORDER — LIDOCAINE HCL (CARDIAC) 20 MG/ML IV SOLN
INTRAVENOUS | Status: AC
  Filled 2014-05-21: qty 5

## 2014-05-21 MED ORDER — ALVIMOPAN 12 MG PO CAPS
12.0000 mg | ORAL_CAPSULE | Freq: Two times a day (BID) | ORAL | Status: DC
  Administered 2014-05-22 – 2014-05-27 (×10): 12 mg via ORAL
  Filled 2014-05-21 (×12): qty 1

## 2014-05-21 MED ORDER — ONDANSETRON HCL 4 MG/2ML IJ SOLN
4.0000 mg | Freq: Four times a day (QID) | INTRAMUSCULAR | Status: DC | PRN
  Administered 2014-05-25: 4 mg via INTRAVENOUS
  Filled 2014-05-21: qty 2

## 2014-05-21 MED ORDER — DEXAMETHASONE SODIUM PHOSPHATE 10 MG/ML IJ SOLN
INTRAMUSCULAR | Status: DC | PRN
  Administered 2014-05-21: 10 mg via INTRAVENOUS

## 2014-05-21 MED ORDER — BUPIVACAINE HCL (PF) 0.5 % IJ SOLN
INTRAMUSCULAR | Status: DC | PRN
  Administered 2014-05-21: 30 mL

## 2014-05-21 MED ORDER — NEOSTIGMINE METHYLSULFATE 10 MG/10ML IV SOLN
INTRAVENOUS | Status: AC
  Filled 2014-05-21: qty 1

## 2014-05-21 SURGICAL SUPPLY — 72 items
APPLICATOR COTTON TIP 6IN STRL (MISCELLANEOUS) IMPLANT
BLADE EXTENDED COATED 6.5IN (ELECTRODE) ×3 IMPLANT
BLADE HEX COATED 2.75 (ELECTRODE) ×3 IMPLANT
BLADE SURG SZ10 CARB STEEL (BLADE) IMPLANT
CABLE HI FREQUENCY MONOPOLAR (ELECTROSURGICAL) ×3 IMPLANT
CANISTER SUCTION 2500CC (MISCELLANEOUS) IMPLANT
CELLS DAT CNTRL 66122 CELL SVR (MISCELLANEOUS) ×1 IMPLANT
CLIP TI LARGE 6 (CLIP) IMPLANT
COVER MAYO STAND STRL (DRAPES) ×3 IMPLANT
DRAPE CAMERA CLOSED 9X96 (DRAPES) ×3 IMPLANT
DRAPE LAPAROSCOPIC ABDOMINAL (DRAPES) ×3 IMPLANT
DRAPE SHEET LG 3/4 BI-LAMINATE (DRAPES) ×3 IMPLANT
DRAPE WARM FLUID 44X44 (DRAPE) ×3 IMPLANT
DRSG OPSITE POSTOP 3X4 (GAUZE/BANDAGES/DRESSINGS) ×6 IMPLANT
DRSG PAD ABDOMINAL 8X10 ST (GAUZE/BANDAGES/DRESSINGS) IMPLANT
ELECT REM PT RETURN 9FT ADLT (ELECTROSURGICAL) ×3
ELECTRODE REM PT RTRN 9FT ADLT (ELECTROSURGICAL) ×1 IMPLANT
GAUZE SPONGE 4X4 12PLY STRL (GAUZE/BANDAGES/DRESSINGS) IMPLANT
GLOVE BIOGEL PI IND STRL 7.0 (GLOVE) ×4 IMPLANT
GLOVE BIOGEL PI INDICATOR 7.0 (GLOVE) ×8
GLOVE SURG SIGNA 7.5 PF LTX (GLOVE) ×6 IMPLANT
GOWN SPEC L4 XLG W/TWL (GOWN DISPOSABLE) ×3 IMPLANT
GOWN STRL REUS W/ TWL XL LVL3 (GOWN DISPOSABLE) ×5 IMPLANT
GOWN STRL REUS W/TWL LRG LVL3 (GOWN DISPOSABLE) ×3 IMPLANT
GOWN STRL REUS W/TWL XL LVL3 (GOWN DISPOSABLE) ×10
KIT BASIN OR (CUSTOM PROCEDURE TRAY) ×3 IMPLANT
LEGGING LITHOTOMY PAIR STRL (DRAPES) ×3 IMPLANT
MANIFOLD NEPTUNE II (INSTRUMENTS) ×3 IMPLANT
NEEDLE HYPO 22GX1.5 SAFETY (NEEDLE) ×3 IMPLANT
NS IRRIG 1000ML POUR BTL (IV SOLUTION) ×6 IMPLANT
PACK GENERAL/GYN (CUSTOM PROCEDURE TRAY) ×3 IMPLANT
PAD TELFA 2X3 NADH STRL (GAUZE/BANDAGES/DRESSINGS) ×3 IMPLANT
POUCH SPECIMEN RETRIEVAL 10MM (ENDOMECHANICALS) ×3 IMPLANT
RELOAD STAPLER BLUE 60MM (STAPLE) ×1 IMPLANT
RTRCTR WOUND ALEXIS 18CM MED (MISCELLANEOUS) ×3
SCALPEL HARMONIC ACE (MISCELLANEOUS) ×3 IMPLANT
SCISSORS LAP 5X35 DISP (ENDOMECHANICALS) ×3 IMPLANT
SET IRRIG TUBING LAPAROSCOPIC (IRRIGATION / IRRIGATOR) ×3 IMPLANT
SHEARS HARMONIC ACE PLUS 36CM (ENDOMECHANICALS) IMPLANT
SLEEVE ADV FIXATION 5X100MM (TROCAR) ×9 IMPLANT
SOLUTION ANTI FOG 6CC (MISCELLANEOUS) ×3 IMPLANT
STAPLE ECHEON FLEX 60 POW ENDO (STAPLE) ×3 IMPLANT
STAPLER CIRC ILS CVD 25MM (STAPLE) ×3 IMPLANT
STAPLER RELOAD BLUE 60MM (STAPLE) ×3
STAPLER VISISTAT 35W (STAPLE) IMPLANT
SUCTION POOLE TIP (SUCTIONS) ×3 IMPLANT
SUT CHROMIC 2 0 SH (SUTURE) IMPLANT
SUT MON AB 5-0 PS2 18 (SUTURE) ×3 IMPLANT
SUT NOV 1 T60/GS (SUTURE) IMPLANT
SUT NOVA 1 T20/GS 25DT (SUTURE) IMPLANT
SUT NOVA NAB DX-16 0-1 5-0 T12 (SUTURE) ×12 IMPLANT
SUT NOVA T20/GS 25 (SUTURE) IMPLANT
SUT PDS AB 1 CTX 36 (SUTURE) IMPLANT
SUT PROLENE 2 0 KS (SUTURE) ×3 IMPLANT
SUT SILK 2 0 (SUTURE) ×2
SUT SILK 2 0 SH CR/8 (SUTURE) ×3 IMPLANT
SUT SILK 2 0SH CR/8 30 (SUTURE) IMPLANT
SUT SILK 2-0 18XBRD TIE 12 (SUTURE) ×1 IMPLANT
SUT SILK 2-0 30XBRD TIE 12 (SUTURE) IMPLANT
SUT SILK 3 0 (SUTURE) ×2
SUT SILK 3 0 SH CR/8 (SUTURE) ×6 IMPLANT
SUT SILK 3-0 18XBRD TIE 12 (SUTURE) ×1 IMPLANT
SUT VIC AB 1 CTX 18 (SUTURE) IMPLANT
SUT VIC AB 2-0 SH 18 (SUTURE) ×3 IMPLANT
SUT VICRYL 0 UR6 27IN ABS (SUTURE) ×3 IMPLANT
SYR CONTROL 10ML LL (SYRINGE) ×3 IMPLANT
TOWEL OR 17X26 10 PK STRL BLUE (TOWEL DISPOSABLE) ×6 IMPLANT
TRAY FOLEY CATH 14FRSI W/METER (CATHETERS) ×3 IMPLANT
TROCAR XCEL 12X100 BLDLESS (ENDOMECHANICALS) ×3 IMPLANT
TROCAR XCEL BLUNT TIP 100MML (ENDOMECHANICALS) ×3 IMPLANT
WATER STERILE IRR 1500ML POUR (IV SOLUTION) IMPLANT
YANKAUER SUCT BULB TIP NO VENT (SUCTIONS) ×6 IMPLANT

## 2014-05-21 NOTE — Anesthesia Postprocedure Evaluation (Signed)
  Anesthesia Post-op Note  Patient: Kathryn [name]  Procedure(s) Performed: Procedure(s) (LRB): LAPAROSCOPIC ASSISTED CLOSURE OF COLOSTOMY AND REPAIR OF TRANSVERSE COLON AND ENTEROTOMY (N/A)  Patient Location: PACU  Anesthesia Type: General  Level of Consciousness: awake and alert   Airway and Oxygen Therapy: Patient Spontanous Breathing  Post-op Pain: mild  Post-op Assessment: Post-op Vital signs reviewed, Patient's Cardiovascular Status Stable, Respiratory Function Stable, Patent Airway and No signs of Nausea or vomiting  Last Vitals:  Filed Vitals:   05/21/14 1305  BP: 141/58  Pulse: 75  Temp: 36.4 C  Resp: 16    Post-op Vital Signs: stable   Complications: No apparent anesthesia complications. Redness near medial aspect of left eye, possible subcutaneous hematoma. Most likely from eye tape. This should resolve spontaneously. Discussed with patient.

## 2014-05-21 NOTE — Interval H&P Note (Signed)
History and Physical Interval Note:  05/21/2014 7:29 AM  Kathryn [name]  has presented today for surgery, with the diagnosis of end colostmy from diveticular perforation  The various methods of treatment have been discussed with the patient and family.  Her son Kathryn [name] is going to be at home with her husband.  I will call him at the end of the surgery.   After consideration of risks, benefits and other options for treatment, the patient has consented to  Procedure(s): COLOSTOMY CLOSURE (N/A) as a surgical intervention .  The patient's history has been reviewed, patient examined, no change in status, stable for surgery.  I have reviewed the patient's chart and labs.  Questions were answered to the patient's satisfaction.     Edoardo Laforte H

## 2014-05-21 NOTE — Transfer of Care (Signed)
Immediate Anesthesia Transfer of Care Note  Patient: Kathryn [name]  Procedure(s) Performed: Procedure(s): LAPAROSCOPIC ASSISTED CLOSURE OF COLOSTOMY AND REPAIR OF TRANSVERSE COLON AND ENTEROTOMY (N/A)  Patient Location: PACU  Anesthesia Type:General  Level of Consciousness: awake  Airway & Oxygen Therapy: Patient Spontanous Breathing and Patient connected to face mask oxygen  Post-op Assessment: Report given to PACU RN and Post -op Vital signs reviewed and stable  Post vital signs: Reviewed and stable  Complications: No apparent anesthesia complications

## 2014-05-21 NOTE — Anesthesia Procedure Notes (Signed)
Procedure Name: Intubation Date/Time: 05/21/2014 7:58 AM Performed by: Ofilia Neas Pre-anesthesia Checklist: Patient identified, Timeout performed, Emergency Drugs available, Suction available and Patient being monitored Patient Re-evaluated:Patient Re-evaluated prior to inductionOxygen Delivery Method: Circle system utilized Preoxygenation: Pre-oxygenation with 100% oxygen Intubation Type: IV induction Laryngoscope Size: Mac and 4 Grade View: Grade II Tube type: Oral Tube size: 7.5 mm Number of attempts: 1 Airway Equipment and Method: Stylet Placement Confirmation: ETT inserted through vocal cords under direct vision,  positive ETCO2 and breath sounds checked- equal and bilateral Secured at: 21 cm Tube secured with: Tape Dental Injury: Teeth and Oropharynx as per pre-operative assessment  Difficulty Due To: Difficult Airway- due to reduced neck mobility

## 2014-05-22 LAB — CBC
HCT: 30.5 % — ABNORMAL LOW (ref 36.0–46.0)
Hemoglobin: 10 g/dL — ABNORMAL LOW (ref 12.0–15.0)
MCH: 30.3 pg (ref 26.0–34.0)
MCHC: 32.8 g/dL (ref 30.0–36.0)
MCV: 92.4 fL (ref 78.0–100.0)
Platelets: 227 10*3/uL (ref 150–400)
RBC: 3.3 MIL/uL — AB (ref 3.87–5.11)
RDW: 12.7 % (ref 11.5–15.5)
WBC: 14.9 10*3/uL — ABNORMAL HIGH (ref 4.0–10.5)

## 2014-05-22 LAB — BASIC METABOLIC PANEL
Anion gap: 9 (ref 5–15)
BUN: 12 mg/dL (ref 6–23)
CHLORIDE: 100 meq/L (ref 96–112)
CO2: 25 mEq/L (ref 19–32)
Calcium: 9.1 mg/dL (ref 8.4–10.5)
Creatinine, Ser: 0.91 mg/dL (ref 0.50–1.10)
GFR calc non Af Amer: 57 mL/min — ABNORMAL LOW (ref >90)
GFR, EST AFRICAN AMERICAN: 66 mL/min — AB (ref >90)
Glucose, Bld: 151 mg/dL — ABNORMAL HIGH (ref 70–99)
POTASSIUM: 4.8 meq/L (ref 3.7–5.3)
Sodium: 134 mEq/L — ABNORMAL LOW (ref 137–147)

## 2014-05-22 MED ORDER — LIP MEDEX EX OINT
TOPICAL_OINTMENT | CUTANEOUS | Status: AC
  Administered 2014-05-22: 11:00:00
  Filled 2014-05-22: qty 7

## 2014-05-22 NOTE — Plan of Care (Signed)
Problem: Phase I Progression Outcomes Goal: Vital signs/hemodynamically stable Outcome: Completed/Met Date Met:  05/22/14

## 2014-05-22 NOTE — Progress Notes (Signed)
Heparin not given d/t bright red blood per rectum post bowel movement or sensation to pass gas. MD aware of blood per rectum noted in shift prior.

## 2014-05-22 NOTE — Progress Notes (Signed)
General Surgery Note  LOS: 1 day  POD -  1 Day Post-Op  Assessment/Plan: 1.  LAPAROSCOPIC ASSISTED CLOSURE OF COLOSTOMY AND REPAIR OF TRANSVERSE COLON AND ENTEROTOMY - 05/21/2014 - D. Riyad Keena  Has NGT, will remove  Looks good.    2. Has pacemaker (originally placed 2000, replaced 2008) Saw Dr. Rollene Fare.  Now followed by Dr. Arelia Sneddon Croitoro. He saw her 01/19/2014 and okayed her for surgery.  3. Hypertension 4.  DVT prophylaxis - SQ Heparin 5.  Foley - to remove   Active Problems:   S/P colostomy takedown  Subjective:  She said that she did not sleep well because of the machine beeping.  In room by herself. Objective:   Filed Vitals:   05/22/14 0600  BP: 106/48  Pulse: 70  Temp:   Resp: 13     Intake/Output from previous day:  11/13 0701 - 11/14 0700 In: 4442.5 [I.V.:4362.5; NG/GT:30; IV Piggyback:50] Out: 2275 [Urine:2025; Emesis/NG output:100]  Intake/Output this shift:      Physical Exam:   General: WN older WF who is alert.   HEENT: Normal. Pupils equal. .   Lungs: Clear.  Does not have IS.   Abdomen: Soft, quiet.   Wound: Okay.     Lab Results:    Recent Labs  05/22/14 0350  WBC 14.9*  HGB 10.0*  HCT 30.5*  PLT 227    BMET   Recent Labs  05/22/14 0350  NA 134*  K 4.8  CL 100  CO2 25  GLUCOSE 151*  BUN 12  CREATININE 0.91  CALCIUM 9.1    PT/INR  No results for input(s): LABPROT, INR in the last 72 hours.  ABG  No results for input(s): PHART, HCO3 in the last 72 hours.  Invalid input(s): PCO2, PO2   Studies/Results:  No results found.   Anti-infectives:   Anti-infectives    Start     Dose/Rate Route Frequency Ordered Stop   05/21/14 0544  cefoTEtan (CEFOTAN) 2 g in dextrose 5 % 50 mL IVPB     2 g100 mL/hr over 30 Minutes Intravenous On call to O.R. 05/21/14 0544 05/21/14 0745      Alphonsa Overall, MD, FACS Pager: [facility]: [phone] 05/22/2014

## 2014-05-22 NOTE — Op Note (Signed)
NAMEJERRELL, HART NO.:  [id number]  MEDICAL RECORD NO.:  [id number]  LOCATION:  [facility]                         FACILITY:  [facility]  PHYSICIAN:  Fenton Malling. Lucia Gaskins, M.D.  DATE OF BIRTH:  06/22/1928  DATE OF PROCEDURE:  05/21/2014                              OPERATIVE REPORT   PREOPERATIVE DIAGNOSIS:  End-sigmoid colostomy after perforated bowel.  POSTOPERATIVE DIAGNOSIS:  End-sigmoid colostomy after perforated bowel.  PROCEDURE:  Laparoscopic-assisted colostomy closure with #25 EEA stapler, repair of transverse colon enterotomy.  Enterolysis for 30 minutes.  Rigid sigmoidscopy.  SURGEON:  Fenton Malling. Lucia Gaskins, M.D.  FIRST ASSISTANT:  Sammuel Hines. Daiva Nakayama, M.D.  ANESTHESIA:  General endotracheal, supervised by Dr. Franne Grip.  ESTIMATED BLOOD LOSS:  100 mL.  No blood administered.  I used 30 mL of 0.5% Marcaine plain as a local anesthetic.  INDICATION FOR PROCEDURE:  Ms. O'Daniel is an 80 year old white female, who sees Dr. Crist Infante as her primary care doctor.  She presented in February 2015 with a perforation of her sigmoid colon.  I performed an end colostomy with Hartmann's procedure.  She has recovered from the surgery and now desires to have reversal of her colostomy.  The indications and potential complications of surgery were explained to the patient.  Potential complications include, but are not limited to, bleeding, infection, leak from the bowel, and some medical complications.  OPERATIVE NOTE:  The patient was taken to the operating room #1 under general anesthesia supervised by Dr. Franne Grip.  She was placed in lithotomy position and had a Foley catheter in place.  She has friable tissue that even in placing a Foley that she had some bleeding from her vagina.  She has a colostomy in the left lower quadrant. This was oversewed with a 2-0 silk.  Her abdomen was prepped with ChloraPrep with the perineum prepped with separately.  Her abdomen was sterilely draped.     A time-out was held and surgical checklist run.  I started with an infraumbilical incision thinking that if I could get in the abdomen with an open technique, it would be safer, but unfortunately on entering the abdomen, I got into what appeared to be the transverse colon.  I extended my incision, was able to eviscerate the transverse colon.  She had about a 1 cm hole in the anterior transverse colon.  I oversewed this with 3-0 silk sutures, and the bowel otherwise looked viable, I dropped this back into the abdominal cavity.  I then was able to free up enough the abdominal cavity to get an Hasson trocar, otherwise she had very few adhesions to the anterior abdominal wall except for this 1 area that I got into.  I placed 3 additional trocars: 1 5 mm in the right lower quadrant, 1 5 mm in the right upper quadrant, and 1 5 mm in the left upper quadrant.  All 5 mm trocars.  I upsized the right lower quadrant trocar to a 12 when I got the stapling.  I spent the first 30 minutes as I did enterolysis of adhesions taking the adhesions of the small bowel which were trapped down the pelvis and around  the colon down.  I was able to get all the small bowel out of the pelvis, identified the cecum and got this out of the pelvis too.  Then I mobilized the distal sigmoid colon.  Dr. Marlou Starks went below and passed a 25 Sizer up the rectum, was able to get up above the peritoneal reflection. It looked like it had about 10 cm of colon above the peritoneal reflection.  Then I took off the proximal 3-4 cm of the distal colon with a blue load of the Echelon 60 mm Ethicon stapler, this was placed in the EndoCatch bag, will later be retrieved from the abdominal cavity.  It looked like we had a good anastomotic area with the bowel cleaned off about 6-7 cm above the peritoneal reflection.  I then took out my laparoscopic instruments, I mobilized the colon in the left lower quadrant, it looked like I had enough length about doing  much mobilization of the left colon.  I used the pursestring device to place across the freed up colon and then divided across.  I used a couple of 3-0 silk holding sutures for this purse-string device.  She has extremely friable tissue, I was only able to get a 25 Sizer in to the bowel. I was worried about stretching or tearing the colon with a larger EEA, so I went with a 25 EEA.  I recognize that this is a small anastmosis.  I tied this down around the anvil, I placed a wound protector in the left lower quadrant.  Then Dr. Marlou Starks went below, passed a 25 up the rectum and he pushed the firing pin right above the staple line and then we were able to attach the distal anvil to the EEA.  He closed this down under direct visualization.  There was no twist in the bowel and the small bowel trapped and he fired this.  We got both good proximal and distal rings in the EEA.  I clamped off the sigmoid colon.  Dr. Marlou Starks sigmoidoscoped and insufflated the colon, there was no bubbling or air leak.  He was able to see at least 3/4th of the staple line. She did have some blood from where the anastomosis was done.  I then irrigated the abdomen, re-looked at the bowel, I closed the left lower quadrant ostomy site longitudinally with interrupted #1 Novafil sutures.  I closed the midline incision with #1 Novafil inverting the sutures in the infraumbilical incision at the umbilical site.  I closed the LLQ ostomy wound with #1 Novafil.  I placed some 2-0 Vicryl suture in the anterior fascia the left lower quadrant and right lower quadrant and closed the skin at each site with a 5-0 Monocryl suture, painted the wounds with Dermabond.  I placed staples on the midline wound and the left lower quadrant wound.  I placed some Telfa wicks in the left lower quadrant wound.  The patient was cool at the end of the procedure, but remained stable hemodynamically.  Blood loss was estimated to be 100 mL or less.  Because of her age and  because of her cardiac disease with a pacemaker, I think she would be best in step-down at least overnight, we will plan to do that.  I did leave an NG tube.  We will leave the Foley in.  Her wounds were sterilely dressed.  Sponge and needle counts were correct at the end of the case.   Fenton Malling. Lucia Gaskins, M.D., [facility]   DHN/MEDQ  D:  05/21/2014  T:  05/21/2014  Job:  [id number]  cc:   Elta Guadeloupe A. Perini, M.D. Fax: [phone]  Sanda Klein, MD Fax: [phone]

## 2014-05-22 NOTE — Plan of Care (Signed)
Problem: Phase I Progression Outcomes Goal: Tubes/drains patent Outcome: Completed/Met Date Met:  05/22/14

## 2014-05-23 LAB — CBC WITH DIFFERENTIAL/PLATELET
Basophils Absolute: 0 10*3/uL (ref 0.0–0.1)
Basophils Relative: 0 % (ref 0–1)
Eosinophils Absolute: 0.2 10*3/uL (ref 0.0–0.7)
Eosinophils Relative: 2 % (ref 0–5)
HCT: 28 % — ABNORMAL LOW (ref 36.0–46.0)
Hemoglobin: 9.1 g/dL — ABNORMAL LOW (ref 12.0–15.0)
LYMPHS PCT: 12 % (ref 12–46)
Lymphs Abs: 1.1 10*3/uL (ref 0.7–4.0)
MCH: 30.5 pg (ref 26.0–34.0)
MCHC: 32.5 g/dL (ref 30.0–36.0)
MCV: 94 fL (ref 78.0–100.0)
MONOS PCT: 13 % — AB (ref 3–12)
Monocytes Absolute: 1.2 10*3/uL — ABNORMAL HIGH (ref 0.1–1.0)
NEUTROS PCT: 73 % (ref 43–77)
Neutro Abs: 6.7 10*3/uL (ref 1.7–7.7)
Platelets: 196 10*3/uL (ref 150–400)
RBC: 2.98 MIL/uL — AB (ref 3.87–5.11)
RDW: 13.1 % (ref 11.5–15.5)
WBC: 9.2 10*3/uL (ref 4.0–10.5)

## 2014-05-23 LAB — BASIC METABOLIC PANEL
Anion gap: 10 (ref 5–15)
BUN: 5 mg/dL — AB (ref 6–23)
CHLORIDE: 104 meq/L (ref 96–112)
CO2: 25 mEq/L (ref 19–32)
Calcium: 9.1 mg/dL (ref 8.4–10.5)
Creatinine, Ser: 0.8 mg/dL (ref 0.50–1.10)
GFR calc Af Amer: 77 mL/min — ABNORMAL LOW (ref >90)
GFR calc non Af Amer: 66 mL/min — ABNORMAL LOW (ref >90)
GLUCOSE: 121 mg/dL — AB (ref 70–99)
POTASSIUM: 4.8 meq/L (ref 3.7–5.3)
Sodium: 139 mEq/L (ref 137–147)

## 2014-05-23 NOTE — Plan of Care (Signed)
Problem: Phase I Progression Outcomes Goal: Voiding-avoid urinary catheter unless indicated Outcome: Completed/Met Date Met:  05/23/14

## 2014-05-23 NOTE — Progress Notes (Signed)
Pt alertx4 v/s stable report given to [facility] who will resume care on 5w. Tx pt to 5w22, no c/o pain will continue to monitor. Jeanie Sewer, RN 05/23/2014 12:49 AM

## 2014-05-23 NOTE — Plan of Care (Signed)
Problem: Phase I Progression Outcomes Goal: OOB as tolerated unless otherwise ordered Outcome: Completed/Met Date Met:  05/23/14 Goal: Incision/dressings dry and intact Outcome: Completed/Met Date Met:  05/23/14 Goal: Sutures/staples intact Outcome: Completed/Met Date Met:  05/23/14 Goal: Other Phase I Outcomes/Goals Outcome: Not Applicable Date Met:  83/25/49

## 2014-05-23 NOTE — Progress Notes (Signed)
Discussed with patient need to transfer to med-surg floor. Pt declined to have family notified stating she would want them called later in the morning.

## 2014-05-23 NOTE — Progress Notes (Signed)
General Surgery Note  LOS: 2 days  POD -  2 Days Post-Op  Assessment/Plan: 1.  LAPAROSCOPIC ASSISTED CLOSURE OF COLOSTOMY AND REPAIR OF TRANSVERSE COLON AND ENTEROTOMY - 05/21/2014 - D. Shawntrice Salle  Sore, but doing okay. Has passed a little flatus.   2. Has pacemaker (originally placed 2000, replaced 2008) Saw Dr. Rollene Fare.  Now followed by Dr. Arelia Sneddon Croitoro. He saw her 01/19/2014 and okayed her for surgery.  3. Hypertension 4.  DVT prophylaxis - SQ Heparin 5.  Foley - to remove 6.  Anemia - Hgb - 9.1 - 05/23/2014  Probably secondary to blood loss and hydration.,  Recheck in AM.   Active Problems:   S/P colostomy takedown  Subjective:  Sore.  Burping and has passed some gas and bloody BM. Objective:   Filed Vitals:   05/23/14 0627  BP: 157/73  Pulse: 74  Temp: 99.1 F (37.3 C)  Resp:      Intake/Output from previous day:  11/14 0701 - 11/15 0700 In: 2875 [I.V.:2875] Out: 1520 [Urine:1520]  Intake/Output this shift:      Physical Exam:   General: WN older WF who is alert.   HEENT: Normal. Pupils equal. .   Lungs: Clear.  Does not have IS.   Abdomen:  Distended.  Has BS.   Wound: Okay.     Lab Results:     Recent Labs  05/22/14 0350 05/23/14 0656  WBC 14.9* 9.2  HGB 10.0* 9.1*  HCT 30.5* 28.0*  PLT 227 196    BMET    Recent Labs  05/22/14 0350 05/23/14 0656  NA 134* 139  K 4.8 4.8  CL 100 104  CO2 25 25  GLUCOSE 151* 121*  BUN 12 5*  CREATININE 0.91 0.80  CALCIUM 9.1 9.1    PT/INR  No results for input(s): LABPROT, INR in the last 72 hours.  ABG  No results for input(s): PHART, HCO3 in the last 72 hours.  Invalid input(s): PCO2, PO2   Studies/Results:  No results found.   Anti-infectives:   Anti-infectives    Start     Dose/Rate Route Frequency Ordered Stop   05/21/14 0544  cefoTEtan (CEFOTAN) 2 g in dextrose 5 % 50 mL IVPB     2 g100 mL/hr over 30 Minutes Intravenous On call to O.R. 05/21/14 0544  05/21/14 0745      Alphonsa Overall, MD, FACS Pager: [facility]: [phone] 05/23/2014

## 2014-05-23 NOTE — Plan of Care (Signed)
Problem: Phase I Progression Outcomes Goal: Pain controlled with appropriate interventions Outcome: Completed/Met Date Met:  05/23/14     

## 2014-05-24 ENCOUNTER — Encounter (HOSPITAL_COMMUNITY): Payer: Self-pay | Admitting: Surgery

## 2014-05-24 LAB — CBC WITH DIFFERENTIAL/PLATELET
Basophils Absolute: 0 10*3/uL (ref 0.0–0.1)
Basophils Relative: 0 % (ref 0–1)
Eosinophils Absolute: 0.2 10*3/uL (ref 0.0–0.7)
Eosinophils Relative: 3 % (ref 0–5)
HCT: 27.9 % — ABNORMAL LOW (ref 36.0–46.0)
HEMOGLOBIN: 8.9 g/dL — AB (ref 12.0–15.0)
LYMPHS ABS: 1.1 10*3/uL (ref 0.7–4.0)
Lymphocytes Relative: 13 % (ref 12–46)
MCH: 30.3 pg (ref 26.0–34.0)
MCHC: 31.9 g/dL (ref 30.0–36.0)
MCV: 94.9 fL (ref 78.0–100.0)
MONOS PCT: 13 % — AB (ref 3–12)
Monocytes Absolute: 1.1 10*3/uL — ABNORMAL HIGH (ref 0.1–1.0)
NEUTROS PCT: 71 % (ref 43–77)
Neutro Abs: 5.6 10*3/uL (ref 1.7–7.7)
Platelets: 183 10*3/uL (ref 150–400)
RBC: 2.94 MIL/uL — AB (ref 3.87–5.11)
RDW: 13 % (ref 11.5–15.5)
WBC: 8 10*3/uL (ref 4.0–10.5)

## 2014-05-24 MED ORDER — HYDROCODONE-ACETAMINOPHEN 5-325 MG PO TABS: 1.0000 | ORAL_TABLET | ORAL | Status: DC | PRN

## 2014-05-24 NOTE — Progress Notes (Signed)
General Surgery Note  LOS: 3 days  POD -  3 Days Post-Op  Assessment/Plan: 1.  LAPAROSCOPIC ASSISTED CLOSURE OF COLOSTOMY AND REPAIR OF TRANSVERSE COLON AND ENTEROTOMY - 05/30/2014 - D. Emett Stapel  Has passed a lot of gas.  Will start clear liquids   2. Has pacemaker (originally placed 2000, replaced 2008) Saw Dr. Rollene Fare.  Now followed by Dr. Arelia Sneddon Croitoro. He saw her 01/19/2014 and okayed her for surgery.  3. Hypertension 4.  DVT prophylaxis - SQ Heparin 5.  Anemia - Hgb - 8.9 - 05/24/2014  Probably secondary to blood loss and hydration.,  "Stable" over last day   Active Problems:   S/P colostomy takedown  Subjective:  Feels better.  Passed a lot of gas.  Has not ambulated enough. Objective:   Filed Vitals:   05/24/14 0541  BP: 158/71  Pulse: 73  Temp: 98.2 F (36.8 C)  Resp: 18     Intake/Output from previous day:  11/15 0701 - 11/16 0700 In: 1500 [I.V.:1500] Out: 3250 [Urine:3250]  Intake/Output this shift:      Physical Exam:   General: WN older WF who is alert.   HEENT: Normal. Pupils equal. .   Lungs: Clear.     Abdomen:  Distended.  Has BS.   Wound: Wicks out of ostomy wound.  Incision look okay.     Lab Results:     Recent Labs  05/23/14 0656 05/24/14 0350  WBC 9.2 8.0  HGB 9.1* 8.9*  HCT 28.0* 27.9*  PLT 196 183    BMET    Recent Labs  05/22/14 0350 05/23/14 0656  NA 134* 139  K 4.8 4.8  CL 100 104  CO2 25 25  GLUCOSE 151* 121*  BUN 12 5*  CREATININE 0.91 0.80  CALCIUM 9.1 9.1    PT/INR  No results for input(s): LABPROT, INR in the last 72 hours.  ABG  No results for input(s): PHART, HCO3 in the last 72 hours.  Invalid input(s): PCO2, PO2   Studies/Results:  No results found.   Anti-infectives:   Anti-infectives    Start     Dose/Rate Route Frequency Ordered Stop   30-May-2014 0544  cefoTEtan (CEFOTAN) 2 g in dextrose 5 % 50 mL IVPB     2 g100 mL/hr over 30 Minutes Intravenous On call to O.R.  May 30, 2014 0544 May 30, 2014 0745      Alphonsa Overall, MD, FACS Pager: [place]: [phone] 05/24/2014

## 2014-05-24 NOTE — Clinical Documentation Improvement (Signed)
Please specify the diagnosis related to below supporting information if appropriate.   Possible Clinical Conditions    Expected Acute Blood Loss Anemia  Acute Blood Loss Anemia  Acute on chronic blood loss anemia  Chronic blood loss anemia  Precipitous drop in Hematocrit  Other Condition________________  Cannot Clinically Determine    Supporting Information: Patient s/p  LAPAROSCOPIC ASSISTED CLOSURE OF COLOSTOMY AND REPAIR OF TRANSVERSE COLON AND ENTEROTOMY on 05/21/2014.    Per 05/23/14 MD progress note = Anemia - Hgb - 9.1 - 05/23/2014  Probably secondary to blood loss and hydration.,  Recheck in AM.   Patient's labs during this admission  Component     Latest Ref Rng 05/22/2014 05/23/2014 05/24/2014  Hemoglobin     12.0 - 15.0 g/dL 10.0 (L) 9.1 (L) 8.9 (L)  HCT     36.0 - 46.0 % 30.5 (L) 28.0 (L) 27.9 (L)   Thank You, Serena Colonel ,RN Clinical Documentation Specialist:  [facility] [employer]

## 2014-05-24 NOTE — Plan of Care (Signed)
Problem: Phase II Progression Outcomes Goal: Pain controlled Outcome: Completed/Met Date Met:  05/24/14 Goal: Vital signs stable Outcome: Completed/Met Date Met:  05/24/14 Goal: Dressings dry/intact Outcome: Completed/Met Date Met:  05/24/14 Goal: Foley discontinued Outcome: Not Applicable Date Met:  31/49/70

## 2014-05-25 LAB — TYPE AND SCREEN
ABO/RH(D): B NEG
ANTIBODY SCREEN: POSITIVE
Unit division: 0

## 2014-05-25 NOTE — Evaluation (Signed)
Physical Therapy Evaluation Patient Details Name: SHELEEN CONCHAS MRN: [id number] DOB: 1928-01-17 Today's Date: 05/25/2014   History of Present Illness  80 yo female s/p colostomy takedown 05/21/14.   Clinical Impression  On eval, pt was Min assist for mobility-Min guard to ambulate ~250 feet with walker, Min assist for bed mobility and to stand. Encouraged pt to ambulate as much as possible with nursing. Recommend HHPT vs SNF depending on progress.     Follow Up Recommendations Home health PT;Supervision - Intermittent vs SNF (depending on progress)    Equipment Recommendations  Rolling walker with 5" wheels;3in1 (PT) (depending on progress and if pt decides she wants to take them)    Recommendations for Other Services       Precautions / Restrictions Precautions Precautions: Fall Precaution Comments: abd surgery Restrictions Weight Bearing Restrictions: No      Mobility  Bed Mobility Overal bed mobility: Needs Assistance Bed Mobility: Supine to Sit     Supine to sit: Min assist     General bed mobility comments: assist for trunk to upright. Increased time.   Transfers Overall transfer level: Needs assistance Equipment used: Rolling walker (2 wheeled) Transfers: Sit to/from Stand Sit to Stand: Min assist         General transfer comment: Assist to rise, stabilize, control descent. VCS safety, technique, hand placement  Ambulation/Gait Ambulation/Gait assistance: Min guard Ambulation Distance (Feet): 250 Feet Assistive device: Rolling walker (2 wheeled) Gait Pattern/deviations: Step-through pattern;Decreased stride length     General Gait Details: slow gait speed.   Stairs            Wheelchair Mobility    Modified Rankin (Stroke Patients Only)       Balance                                             Pertinent Vitals/Pain Pain Assessment: Faces Faces Pain Scale: Hurts little more Pain Location: abdomen Pain  Intervention(s): Monitored during session    Home Living Family/patient expects to be discharged to:: Private residence Living Arrangements: Spouse/significant other Available Help at Discharge: Family Type of Home: House Home Access: Stairs to enter Entrance Stairs-Rails: Right Entrance Stairs-Number of Steps: 4.  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    Prior Function Level of Independence: Independent               Hand Dominance        Extremity/Trunk Assessment   Upper Extremity Assessment: Overall WFL for tasks assessed           Lower Extremity Assessment: Generalized weakness      Cervical / Trunk Assessment: Normal  Communication   Communication: No difficulties  Cognition Arousal/Alertness: Awake/alert Behavior During Therapy: WFL for tasks assessed/performed Overall Cognitive Status: Within Functional Limits for tasks assessed                      General Comments      Exercises        Assessment/Plan    PT Assessment Patient needs continued PT services  PT Diagnosis Difficulty walking;Generalized weakness;Acute pain   PT Problem List Decreased strength;Decreased activity tolerance;Decreased balance;Decreased mobility;Pain;Decreased knowledge of use of DME  PT Treatment Interventions DME instruction;Gait training;Functional mobility training;Therapeutic activities;Therapeutic exercise;Patient/family  education;Balance training   PT Goals (Current goals can be found in the Care Plan section) Acute Rehab PT Goals Patient Stated Goal: to return home PT Goal Formulation: With patient Time For Goal Achievement: 06/08/14 Potential to Achieve Goals: Good    Frequency Min 3X/week   Barriers to discharge Decreased caregiver support pt actually takes care of husband    Co-evaluation               End of Session Equipment  Utilized During Treatment: Gait belt Activity Tolerance: Patient tolerated treatment well Patient left: in bed;with call bell/phone within reach (sitting at EOB eating lunch. Made NT aware that pt may need help getting back into bed once finished. )           Time: 1107-1140 PT Time Calculation (min) (ACUTE ONLY): 33 min   Charges:   PT Evaluation $Initial PT Evaluation Tier I: 1 Procedure PT Treatments $Gait Training: 8-22 mins $Therapeutic Activity: 8-22 mins   PT G Codes:          Weston Anna, MPT Pager: [phone]

## 2014-05-25 NOTE — Progress Notes (Signed)
Patient states she has not had a bowel movement yet.

## 2014-05-25 NOTE — Progress Notes (Addendum)
General Surgery Note  LOS: 4 days  POD -  4 Days Post-Op  Assessment/Plan: 1.  LAPAROSCOPIC ASSISTED CLOSURE OF COLOSTOMY AND REPAIR OF TRANSVERSE COLON AND ENTEROTOMY - 05/21/2014 - Kathryn [name]  Lot of flauts, no BM.  Doing okay.  Will advance to full liquids.  2. Has pacemaker (originally placed 2000, replaced 2008) Saw Dr. Rollene Fare.  Now followed by Dr. Arelia Sneddon Croitoro. He saw her 01/19/2014 and okayed her for surgery.  3. Hypertension 4.  DVT prophylaxis - SQ Heparin 5.  Anemia - Hgb - 8.9 - 05/24/2014  Acute on chronic blood loss anemia   Active Problems:   S/P colostomy takedown  Subjective:  Felt worse this AM.  But was able to walk around the floor and feels better this afternoon.  Wants to try full liquds.  Objective:   Filed Vitals:   05/25/14 0510  BP: 161/67  Pulse: 70  Temp: 98.6 F (37 C)  Resp: 16     Intake/Output from previous day:  11/16 0701 - 11/17 0700 In: 2208.8 [P.O.:350; I.V.:1858.8] Out: 2700 [Urine:2700]  Intake/Output this shift:  Total I/O In: 343.8 [I.V.:343.8] Out: -    Physical Exam:   General: WN older WF who is alert.   HEENT: Normal. Pupils equal. .   Lungs: Clear.     Abdomen:   Has BS.   Wound: Dressings intact.     Lab Results:     Recent Labs  05/23/14 0656 05/24/14 0350  WBC 9.2 8.0  HGB 9.1* 8.9*  HCT 28.0* 27.9*  PLT 196 183    BMET    Recent Labs  05/23/14 0656  NA 139  K 4.8  CL 104  CO2 25  GLUCOSE 121*  BUN 5*  CREATININE 0.80  CALCIUM 9.1    PT/INR  No results for input(s): LABPROT, INR in the last 72 hours.  ABG  No results for input(s): PHART, HCO3 in the last 72 hours.  Invalid input(s): PCO2, PO2   Studies/Results:  No results found.   Anti-infectives:   Anti-infectives    Start     Dose/Rate Route Frequency Ordered Stop   05/21/14 0544  cefoTEtan (CEFOTAN) 2 g in dextrose 5 % 50 mL IVPB     2 g100 mL/hr over 30 Minutes Intravenous On call to O.R.  05/21/14 0544 05/21/14 0745      Alphonsa Overall, MD, FACS Pager: [facility] Office: [phone] 05/25/2014

## 2014-05-25 NOTE — Care Management Note (Unsigned)
    Page 1 of 2   05/25/2014     2:10:53 PM CARE MANAGEMENT NOTE 05/25/2014  Patient:  Kathryn [name], Kathryn [name]   Account Number:  [id number]  Date Initiated:  05/25/2014  Documentation initiated by:  [facility]  Subjective/Objective Assessment:   adm: LAPAROSCOPIC ASSISTED CLOSURE OF COLOSTOMY AND REPAIR OF TRANSVERSE COLON AND ENTEROTOMY     Action/Plan:   discharge planning   Anticipated DC Date:  05/26/2014   Anticipated DC Plan:  [place]  CM consult      [facility] Choice  HOME HEALTH   Choice offered to / List presented to:  [name]   DME arranged  3-N-1  Vassie Moselle      DME agency  [place] arranged  [place]   Status of service:  In process, will continue to follow Medicare Important Message given?   (If response is "NO", the following Medicare IM given date fields will be blank) Date Medicare IM given:   Medicare IM given by:   Date Additional Medicare IM given:   Additional Medicare IM given by:    Discharge Disposition:  [place]  Per UR Regulation:    If discussed at Long Length of Stay Meetings, dates discussed:    Comments:  05/25/14 14:00 CM met with pt in room to offer choice for home health agency.  Pt chooses [facility] to render HHPT.  CM called [facility] DME rep to please deliver 3n1 and rolling walker to room prior to discharge.  Referral called to [facility].  CM has requested HHPT order, DME orders for 3n1 and rolling walker, and F2F.  CM will follow for orders. Mariane Masters, BSN, CM [phone].

## 2014-05-26 MED ORDER — METOPROLOL TARTRATE 12.5 MG HALF TABLET
12.5000 mg | ORAL_TABLET | Freq: Two times a day (BID) | ORAL | Status: DC
  Administered 2014-05-26 – 2014-05-27 (×3): 12.5 mg via ORAL
  Filled 2014-05-26 (×5): qty 1

## 2014-05-26 NOTE — Plan of Care (Signed)
Problem: Phase II Progression Outcomes Goal: Surgical site without signs of infection Outcome: Completed/Met Date Met:  05/26/14 Goal: Sutures/staples intact Outcome: Completed/Met Date Met:  05/26/14  Problem: Phase III Progression Outcomes Goal: Voiding independently Outcome: Completed/Met Date Met:  05/26/14 Goal: IV changed to normal saline lock Outcome: Completed/Met Date Met:  05/26/14 Goal: Nasogastric tube discontinued Outcome: Not Applicable Date Met:  73/53/29 Goal: Demonstrates TCDB, IS independently Outcome: Completed/Met Date Met:  05/26/14

## 2014-05-26 NOTE — Plan of Care (Signed)
Problem: Phase I Progression Outcomes Goal: Initial discharge plan identified Outcome: Completed/Met Date Met:  05/26/14  Problem: Phase II Progression Outcomes Goal: Progress activity as tolerated unless otherwise ordered Outcome: Completed/Met Date Met:  05/26/14 Goal: Progressing with IS, TCDB Outcome: Completed/Met Date Met:  05/26/14 Goal: Return of bowel function (flatus, BM) IF ABDOMINAL SURGERY:  Outcome: Completed/Met Date Met:  05/26/14 Goal: Discharge plan established Outcome: Completed/Met Date Met:  05/26/14 Goal: Tolerating diet Outcome: Completed/Met Date Met:  05/26/14 Goal: Other Phase II Outcomes/Goals Outcome: Completed/Met Date Met:  05/26/14

## 2014-05-26 NOTE — Progress Notes (Signed)
General Surgery Note  LOS: 5 days  POD -  5 Days Post-Op  Assessment/Plan: 1.  LAPAROSCOPIC ASSISTED CLOSURE OF COLOSTOMY AND REPAIR OF TRANSVERSE COLON AND ENTEROTOMY - 05/21/2014 - D. Courteney Alderete  Small BM, has flatus.  To advance to reg diet.  2. Has pacemaker (originally placed 2000, replaced 2008) Saw Dr. Rollene Fare.  Now followed by Dr. Arelia Sneddon Croitoro. He saw her 01/19/2014 and okayed her for surgery.  3. Hypertension 4.  DVT prophylaxis - SQ Heparin 5.  Anemia - Hgb - 8.9 - 05/24/2014  Acute on chronic blood loss anemia   Active Problems:   S/P colostomy takedown  Subjective:  Doing okay.  Tolerated full liquids.  Wants more to eat.  Objective:   Filed Vitals:   05/26/14 0600  BP: 148/67  Pulse: 69  Temp: 98 F (36.7 C)  Resp: 16     Intake/Output from previous day:  11/17 0701 - 11/18 0700 In: 1639.1 [P.O.:240; I.V.:1399.1] Out: 1231 [Urine:1230; Stool:1]  Intake/Output this shift:      Physical Exam:   General: WN older WF who is alert.   HEENT: Normal. Pupils equal. .   Lungs: Clear.     Abdomen:   Has BS.   Wound:  Clean.     Lab Results:     Recent Labs  05/24/14 0350  WBC 8.0  HGB 8.9*  HCT 27.9*  PLT 183    BMET   No results for input(s): NA, K, CL, CO2, GLUCOSE, BUN, CREATININE, CALCIUM in the last 72 hours.  PT/INR  No results for input(s): LABPROT, INR in the last 72 hours.  ABG  No results for input(s): PHART, HCO3 in the last 72 hours.  Invalid input(s): PCO2, PO2   Studies/Results:  No results found.   Anti-infectives:   Anti-infectives    Start     Dose/Rate Route Frequency Ordered Stop   05/21/14 0544  cefoTEtan (CEFOTAN) 2 g in dextrose 5 % 50 mL IVPB     2 g100 mL/hr over 30 Minutes Intravenous On call to O.R. 05/21/14 0544 05/21/14 0745      Alphonsa Overall, MD, FACS Pager: [facility] Office: [phone] 05/26/2014

## 2014-05-27 MED ORDER — HYDROCODONE-ACETAMINOPHEN 7.5-325 MG/15ML PO SOLN: 10.0000 mL | Freq: Four times a day (QID) | ORAL | Status: DC | PRN

## 2014-05-27 NOTE — Discharge Summary (Signed)
Physician Discharge Summary  Patient ID:  Kathryn [name]  MRN: [id number]  DOB/AGE: 1928/03/03 80 y.o.  Admit date: 05/21/2014 Discharge date: 05/27/2014  Discharge Diagnoses:  1.  Sigmoid colectomy, Hartmann's pouch (emergency) - 08/28/2103 - D. Concettina Leth For perforated diverticulitis of the sigmoid colon.  End sigmoid colostomy - comes for colostomy reversal  2. Has pacemaker (originally placed 2000, replaced 2008) Saw Dr. Rollene Fare.  Now followed by Dr. Arelia Sneddon Croitoro. He saw her 01/19/2014 and okayed her for surgery.  3. Hypertension 4. DVT prophylaxis - SQ Heparin 5. Anemia - Hgb - 8.9 - 05/24/2014  Acute on chronic blood loss anemia   Active Problems:   S/P colostomy takedown  Operation: Procedure(s):  LAPAROSCOPIC ASSISTED CLOSURE OF COLOSTOMY AND REPAIR OF TRANSVERSE COLON AND ENTEROTOMY on 05/21/2014 - D. Lucia Gaskins  Discharged Condition: good  Hospital Course: Kathryn [name] is an 80 y.o. female whose primary care physician is Jerlyn Ly, MD and who was admitted 05/21/2014 with a chief complaint of colostomy secondary to sigmoid colon perforation in 08/2013.   She was brought to the operating room on 05/21/2014 and underwent LAPAROSCOPIC ASSISTED CLOSURE OF COLOSTOMY AND REPAIR OF TRANSVERSE COLON AND ENTEROTOMY. Post op she did well.  Because of her age and underlying heart disease, I kept her in step down for 2 days. She started having bowel function on the 4th post op day.  Her diet was advanced.  She is on regular food, passing flatus, and having BM's. She is ready to go home.   The discharge instructions were reviewed with the patient.  Consults: None  Significant Diagnostic Studies: Results for orders placed or performed during the hospital encounter of 05/21/14  Surgical pcr screen  Result Value Ref Range   MRSA, PCR NEGATIVE NEGATIVE   Staphylococcus aureus NEGATIVE NEGATIVE  Basic metabolic panel  Result  Value Ref Range   Sodium 134 (L) 137 - 147 mEq/L   Potassium 4.8 3.7 - 5.3 mEq/L   Chloride 100 96 - 112 mEq/L   CO2 25 19 - 32 mEq/L   Glucose, Bld 151 (H) 70 - 99 mg/dL   BUN 12 6 - 23 mg/dL   Creatinine, Ser 0.91 0.50 - 1.10 mg/dL   Calcium 9.1 8.4 - 10.5 mg/dL   GFR calc non Af Amer 57 (L) >90 mL/min   GFR calc Af Amer 66 (L) >90 mL/min   Anion gap 9 5 - 15  CBC  Result Value Ref Range   WBC 14.9 (H) 4.0 - 10.5 K/uL   RBC 3.30 (L) 3.87 - 5.11 MIL/uL   Hemoglobin 10.0 (L) 12.0 - 15.0 g/dL   HCT 30.5 (L) 36.0 - 46.0 %   MCV 92.4 78.0 - 100.0 fL   MCH 30.3 26.0 - 34.0 pg   MCHC 32.8 30.0 - 36.0 g/dL   RDW 12.7 11.5 - 15.5 %   Platelets 227 150 - 400 K/uL  CBC with Differential  Result Value Ref Range   WBC 9.2 4.0 - 10.5 K/uL   RBC 2.98 (L) 3.87 - 5.11 MIL/uL   Hemoglobin 9.1 (L) 12.0 - 15.0 g/dL   HCT 28.0 (L) 36.0 - 46.0 %   MCV 94.0 78.0 - 100.0 fL   MCH 30.5 26.0 - 34.0 pg   MCHC 32.5 30.0 - 36.0 g/dL   RDW 13.1 11.5 - 15.5 %   Platelets 196 150 - 400 K/uL   Neutrophils Relative % 73 43 - 77 %   Neutro Abs 6.7  1.7 - 7.7 K/uL   Lymphocytes Relative 12 12 - 46 %   Lymphs Abs 1.1 0.7 - 4.0 K/uL   Monocytes Relative 13 (H) 3 - 12 %   Monocytes Absolute 1.2 (H) 0.1 - 1.0 K/uL   Eosinophils Relative 2 0 - 5 %   Eosinophils Absolute 0.2 0.0 - 0.7 K/uL   Basophils Relative 0 0 - 1 %   Basophils Absolute 0.0 0.0 - 0.1 K/uL  Basic metabolic panel  Result Value Ref Range   Sodium 139 137 - 147 mEq/L   Potassium 4.8 3.7 - 5.3 mEq/L   Chloride 104 96 - 112 mEq/L   CO2 25 19 - 32 mEq/L   Glucose, Bld 121 (H) 70 - 99 mg/dL   BUN 5 (L) 6 - 23 mg/dL   Creatinine, Ser 0.80 0.50 - 1.10 mg/dL   Calcium 9.1 8.4 - 10.5 mg/dL   GFR calc non Af Amer 66 (L) >90 mL/min   GFR calc Af Amer 77 (L) >90 mL/min   Anion gap 10 5 - 15  CBC with Differential  Result Value Ref Range   WBC 8.0 4.0 - 10.5 K/uL   RBC 2.94 (L) 3.87 - 5.11 MIL/uL   Hemoglobin 8.9 (L) 12.0 - 15.0 g/dL   HCT  27.9 (L) 36.0 - 46.0 %   MCV 94.9 78.0 - 100.0 fL   MCH 30.3 26.0 - 34.0 pg   MCHC 31.9 30.0 - 36.0 g/dL   RDW 13.0 11.5 - 15.5 %   Platelets 183 150 - 400 K/uL   Neutrophils Relative % 71 43 - 77 %   Neutro Abs 5.6 1.7 - 7.7 K/uL   Lymphocytes Relative 13 12 - 46 %   Lymphs Abs 1.1 0.7 - 4.0 K/uL   Monocytes Relative 13 (H) 3 - 12 %   Monocytes Absolute 1.1 (H) 0.1 - 1.0 K/uL   Eosinophils Relative 3 0 - 5 %   Eosinophils Absolute 0.2 0.0 - 0.7 K/uL   Basophils Relative 0 0 - 1 %   Basophils Absolute 0.0 0.0 - 0.1 K/uL  Type and screen  Result Value Ref Range   ABO/RH(D) B NEG    Antibody Screen POS    Sample Expiration 05/24/2014    Unit Number [id number]    Blood Component Type RED CELLS,LR    Unit division 00    Status of Unit REL FROM [facility]    Transfusion Status OK TO TRANSFUSE    Crossmatch Result COMPATIBLE     No results found.  Discharge Exam:  Filed Vitals:   05/27/14 0600  BP: 149/60  Pulse: 69  Temp: 98.4 F (36.9 C)  Resp: 16    General: WN older WF who is alert and generally healthy appearing.  Lungs: Clear to auscultation and symmetric breath sounds. Heart:  RRR. No murmur or rub. Abdomen: Soft.  Normal bowel sounds. Incisions look good.  Will removed midline staples and leave LLQ staples in place.  Discharge Medications:     Medication List    TAKE these medications        acetaminophen 500 MG tablet  Commonly known as:  TYLENOL  Take 1,000 mg by mouth every 6 (six) hours as needed for mild pain or headache.     aspirin EC 81 MG tablet  Take 81 mg by mouth every morning.     CALTRATE 600+D PO  Take 1 tablet by mouth every morning.     GLUCOSAMINE-MSM DS  PO  Take 1 tablet by mouth every morning.     HYDROcodone-acetaminophen 7.5-325 mg/15 ml solution  Commonly known as:  HYCET  Take 10 mLs by mouth every 6 (six) hours as needed for moderate pain.     metoprolol tartrate 25 MG tablet  Commonly known as:  LOPRESSOR  Take 12.5 mg  by mouth 2 (two) times daily.     valsartan 80 MG tablet  Commonly known as:  DIOVAN  Take 0.5 tablets by mouth every morning.     vitamin C 500 MG tablet  Commonly known as:  ASCORBIC ACID  Take 500 mg by mouth every morning.        Disposition: 01-Home or Self Care      Discharge Instructions    Diet - low sodium heart healthy    Complete by:  As directed      Increase activity slowly    Complete by:  As directed            Follow-up Information    Follow up with [facility].   Why:  home health physical therapy   Contact information:   [address] [place] [address] [phone]       Follow up with [facility].   Why:  3n1 and rolling walker   Contact information:   [place] [address] [phone]      Activity:  Driving - May drive in 7 days, if doing well   Lifting - No lifting more than 15 pounds for 2 weeks, then no limit  Wound Care:   May shower  Diet:  As tolerated  Follow up appointment:  Call Dr. Pollie Friar office [facility]) at [phone] for an appointment in 2 weeks.  Medications and dosages:  Resume your home medications.  You have a prescription for:  Hydrocodone elixir  Signed: Alphonsa Overall, M.D., [facility] Office:  [phone]  05/27/2014, 8:25 AM

## 2014-05-27 NOTE — Progress Notes (Addendum)
DC instructions have been reviewed with the pt and all questions and concerns have been addressed. Pt to call and make her own follow up appointment. Pt is waiting on her 3in1 and rolling walker to be delivered. Her son will pick up and take her home. Staples to mid ABD removed and steri strips placed. All other skin intact and incision sites all look unremarkable. Will continue to monitor until pts ride arrives.

## 2014-05-27 NOTE — Discharge Instructions (Signed)
[facility] [place] [facility] - DISCHARGE INSTRUCTIONS TO PATIENT  Activity:  Driving - May drive in 7 days, if doing well   Lifting - No lifting more than 15 pounds for 2 weeks, then no limit  Wound Care:   May shower  Diet:  As tolerated  Follow up appointment:  Call Dr. Pollie Friar office [facility]) at [phone] for an appointment in 2 weeks.  Medications and dosages:  Resume your home medications.  You have a prescription for:  Hydrocodone elixir  Call Dr. Lucia Gaskins or his office  [phone]) if you have:  Temperature greater than 100.4,  Persistent nausea and vomiting,  Severe uncontrolled pain,  Redness, tenderness, or signs of infection (pain, swelling, redness, odor or green/yellow discharge around the site),  Difficulty breathing, headache or visual disturbances,  Any other questions or concerns you may have after discharge.  In an emergency, call 911 or go to an Emergency Department at a nearby hospital.

## 2014-05-28 ENCOUNTER — Telehealth: Payer: Self-pay | Admitting: Cardiovascular Disease

## 2014-05-28 NOTE — Telephone Encounter (Signed)
Kathryn [name] is calling about getting a new machine . Please call    Thanks

## 2014-05-28 NOTE — Telephone Encounter (Signed)
Informed pt that her [employer] monitor was ordered on 11-6 and per website it has been shipped. Address confirmed with pt. I also let her know that after our last convo I scheduled her for a remote on 11-23 assuming that she would have her monitor by then. I asked her to call if she is unable to send on that day. Patient voiced understanding.

## 2014-05-31 ENCOUNTER — Telehealth: Payer: Self-pay | Admitting: Cardiology

## 2014-05-31 ENCOUNTER — Encounter: Payer: Medicare Other | Admitting: *Deleted

## 2014-05-31 NOTE — Telephone Encounter (Signed)
Spoke with pt and reminded pt of remote transmission that is due today. Pt verbalized understanding.   

## 2014-06-01 ENCOUNTER — Encounter: Payer: Self-pay | Admitting: Cardiology

## 2014-06-10 ENCOUNTER — Telehealth: Payer: Self-pay | Admitting: Cardiovascular Disease

## 2014-06-10 NOTE — Telephone Encounter (Signed)
Spoke to patient  She states she still has not received new home monitor since the call to  office in oct 2015 Will send to [facility] personnel to discuss with patient. Please contact patient ,she is concerned  She has not been able to to do a remote

## 2014-06-10 NOTE — Telephone Encounter (Signed)
Pt called in stating that she still has not received her heart machine that was suppose to be ordered on 11/6. Please call  Thanks

## 2014-06-10 NOTE — Telephone Encounter (Signed)
Close encounter 

## 2014-06-10 NOTE — Telephone Encounter (Signed)
Spoke with patient. Informed her that [employer] has her monitor on backorder- no estimated date of shipment. Informed pt of her appt with [place] 08-10-14 at [facility] office at 10:30am.

## 2014-08-10 ENCOUNTER — Encounter: Payer: Self-pay | Admitting: Cardiovascular Disease

## 2014-08-10 ENCOUNTER — Ambulatory Visit (INDEPENDENT_AMBULATORY_CARE_PROVIDER_SITE_OTHER): Payer: Medicare Other | Admitting: Cardiovascular Disease

## 2014-08-10 VITALS — BP 144/76 | HR 73 | Resp 16 | Ht 64.0 in | Wt 116.1 lb

## 2014-08-10 DIAGNOSIS — Z950 Presence of cardiac pacemaker: Secondary | ICD-10-CM

## 2014-08-10 DIAGNOSIS — I442 Atrioventricular block, complete: Secondary | ICD-10-CM

## 2014-08-10 DIAGNOSIS — I10 Essential (primary) hypertension: Secondary | ICD-10-CM

## 2014-08-10 DIAGNOSIS — I480 Paroxysmal atrial fibrillation: Secondary | ICD-10-CM

## 2014-08-10 LAB — MDC_IDC_ENUM_SESS_TYPE_INCLINIC
Battery Impedance: 3018 Ohm
Battery Remaining Longevity: 13 mo
Battery Voltage: 2.67 V
Brady Statistic AP VP Percent: 80.1 %
Brady Statistic AS VS Percent: 0.1 % — CL
Lead Channel Impedance Value: 421 Ohm
Lead Channel Impedance Value: 723 Ohm
Lead Channel Pacing Threshold Amplitude: 0.5 V
Lead Channel Pacing Threshold Pulse Width: 0.4 ms
Lead Channel Setting Pacing Amplitude: 2 V
Lead Channel Setting Pacing Amplitude: 2.5 V
Lead Channel Setting Pacing Pulse Width: 0.4 ms
Lead Channel Setting Sensing Sensitivity: 4 mV
MDC IDC MSMT LEADCHNL RA PACING THRESHOLD AMPLITUDE: 0.5 V
MDC IDC MSMT LEADCHNL RA PACING THRESHOLD PULSEWIDTH: 0.4 ms
MDC IDC MSMT LEADCHNL RA SENSING INTR AMPL: 2.8 mV
MDC IDC STAT BRADY AP VS PERCENT: 0.1 % — AB
MDC IDC STAT BRADY AS VP PERCENT: 19.9 %

## 2014-08-10 NOTE — Patient Instructions (Signed)
Remote monitoring is used to monitor your Pacemaker from home. This monitoring reduces the number of office visits required to check your device to one time per year. It allows [place] to monitor the functioning of your device to ensure it is working properly. You are scheduled for a device check from home on Nov 09, 2014. You may send your transmission at any time that day. If you have a wireless device, the transmission will be sent automatically. After your physician reviews your transmission, you will receive a postcard with your next transmission date.  Dr. Sallyanne Kuster recommends that you schedule a follow-up appointment in: One year.

## 2014-08-11 ENCOUNTER — Encounter: Payer: Self-pay | Admitting: Cardiovascular Disease

## 2014-08-11 NOTE — Progress Notes (Signed)
Patient ID: Kathryn [name], female   DOB: Oct 05, 1927, 81 y.o.   MRN: [id number]     Reason for office visit Pacemaker follow-up, complete heart block paroxysmal atrial fibrillation  Kathryn [name] had successful takedown of her colostomy with reanastomosis of her colon roughly 2 months ago. She is having a lot of trouble with diarrhea but this seems to be slowly improving.  Interrogation of her pacemaker shows one other episode of lengthy atrial fibrillation lasting almost 10 hours on November 21, roughly 1 week after her abdominal surgery. This is the second episode of lengthy atrial fibrillation in the last 12 months or so, similar to her historical trend of atrial fibrillation roughly twice a year. As before the episode was asymptomatic.  Pacemaker generator longevity is estimated to be roughly 13 months. Lead parameters are excellent. There are no detectable R waves. She has 100% ventricular pacing (pacemaker dependent). Has roughly 80% atrial pacing  She has complete heart block and a dual-chamber Medtronic pacemaker which was implanted in 2000, with a generator change out in 2008. In 2000 she presented with what appeared to be an acute anterior wall myocardial infarction following a bradycardia-related ventricular fibrillation episode during treadmill stress testing as an outpatient. She underwent emergency cardiac catheterization and was found to have normal coronary arteries with a Takotsubo cardiomyopathy. She has treated systemic hypertension.  Allergies  Allergen Reactions  . Codeine     NOT SURE WHAT THE REACTION WAS  . Darvocet [Propoxyphene N-Acetaminophen] Other (See Comments)    Hallucination   . Epinephrine Other (See Comments)    Severe increase in heart rate  . Percocet [Oxycodone-Acetaminophen] Other (See Comments)    Hallucinations     Current Outpatient Prescriptions  Medication Sig Dispense Refill  . aspirin EC 81 MG tablet Take 81 mg by mouth every morning.       . Calcium Carbonate-Vitamin D (CALTRATE 600+D PO) Take 1 tablet by mouth every morning.    . Glucosamine Sulfate-MSM (GLUCOSAMINE-MSM DS PO) Take 1 tablet by mouth every morning.    . metoprolol tartrate (LOPRESSOR) 25 MG tablet Take 12.5 mg by mouth 2 (two) times daily.     . valsartan (DIOVAN) 80 MG tablet Take 0.5 tablets by mouth every morning.     . vitamin C (ASCORBIC ACID) 500 MG tablet Take 500 mg by mouth every morning.     No current facility-administered medications for this visit.    Past Medical History  Diagnosis Date  . DVT (deep venous thrombosis) 2010  . Bradycardia   . Syncope     permanent pacemaker 2008  . Presence of permanent cardiac pacemaker     COMPLETE HEART BLOCK--DR. Chenika Nevils   . Dysrhythmia     HX OF PAROXYSMAL ATRIAL FIB- TAKES ASPIRIN FOR BLOOD THINNER  . Hypertension   . GERD (gastroesophageal reflux disease)   . Arthritis     HANDS  . Colostomy in place     S/P COLON RESECTION AND COLOSTOMY 08/27/13 FOR COLON PERFORATION    Past Surgical History  Procedure Laterality Date  . Pacemaker insertion  12/02/1998    medtronic  . Cholecystectomy    . Abdominal hysterectomy    . Appendectomy    . Tonsillectomy    . Cataract extraction, bilateral    . Laparotomy N/A 08/27/2013    Procedure: LAPAROSCOPY CONVERTED TO OPEN LAPAROTOMY WITH SIGMOID COLECTOMY AND COLOSTOMY;  Surgeon: Shann Medal, MD;  Location: [facility];  Service: General;  Laterality: N/A;  .  Permanent pacemaker generator change  02/28/2007    Medtronic Adapta  . Breast enhancement surgery    . US echocardiography  10/09/2011    mild LAE, mild MR,mild to mod TR  . Colonoscopy N/A 03/05/2014    Procedure: COLONOSCOPY;  Surgeon: Shann Medal, MD;  Location: [facility];  Service: General;  Laterality: N/A;  . Colostomy closure N/A 05/21/2014    Procedure: LAPAROSCOPIC ASSISTED CLOSURE OF COLOSTOMY AND REPAIR OF TRANSVERSE COLON AND ENTEROTOMY;  Surgeon: Alphonsa Overall, MD;  Location: [facility];   Service: General;  Laterality: N/A;    Family History  Problem Relation Age of Onset  . Heart disease Mother   . Heart disease Father     History   Social History  . Marital Status: Married    Spouse Name: N/A    Number of Children: N/A  . Years of Education: N/A   Occupational History  . Not on file.   Social History Main Topics  . Smoking status: Never Smoker   . Smokeless tobacco: Never Used  . Alcohol Use: No  . Drug Use: No  . Sexual Activity: No   Other Topics Concern  . Not on file   Social History Narrative    Review of systems: The patient specifically denies any chest pain at rest or with exertion, dyspnea at rest or with exertion, orthopnea, paroxysmal nocturnal dyspnea, syncope, palpitations, focal neurological deficits, intermittent claudication, lower extremity edema, unexplained weight gain, cough, hemoptysis or wheezing.  The patient also denies abdominal pain, nausea, vomiting, dysphagia, constipation, polyuria, polydipsia, dysuria, hematuria, frequency, urgency, abnormal bleeding or bruising, fever, chills, unexpected weight changes, mood swings, change in skin or hair texture, change in voice quality, auditory or visual problems, allergic reactions or rashes, new musculoskeletal complaints other than usual "aches and pains".   PHYSICAL EXAM BP 144/76 mmHg  Pulse 73  Resp 16  Ht 5\' 4"  (1.626 m)  Wt 116 lb 1.6 oz (52.663 kg)  BMI 19.92 kg/m2  General: Alert, oriented x3, no distress Head: no evidence of trauma, PERRL, EOMI, no exophtalmos or lid lag, no myxedema, no xanthelasma; normal ears, nose and oropharynx Neck: normal jugular venous pulsations and no hepatojugular reflux; brisk carotid pulses without delay and no carotid bruits Chest: clear to auscultation, no signs of consolidation by percussion or palpation, normal fremitus, symmetrical and full respiratory excursions Cardiovascular: normal position and quality of the apical impulse,  regular rhythm, normal first and paradoxically split second heart sounds, no murmurs, rubs or gallops Abdomen: no tenderness or distention, no masses by palpation, no abnormal pulsatility or arterial bruits, normal bowel sounds, no hepatosplenomegaly Extremities: no clubbing, cyanosis or edema; 2+ radial, ulnar and brachial pulses bilaterally; 2+ right femoral, posterior tibial and dorsalis pedis pulses; 2+ left femoral, posterior tibial and dorsalis pedis pulses; no subclavian or femoral bruits Neurological: grossly nonfocal   EKG: AV sequential pacing with positive R waves in V1 and V2 suggesting early activation of the conduction system during ventricular pacing  Lipid Panel  No results found for: CHOL, TRIG, HDL, CHOLHDL, VLDL, LDLCALC, LDLDIRECT  BMET    Component Value Date/Time   NA 139 05/23/2014 0656   K 4.8 05/23/2014 0656   CL 104 05/23/2014 0656   CO2 25 05/23/2014 0656   GLUCOSE 121* 05/23/2014 0656   BUN 5* 05/23/2014 0656   CREATININE 0.80 05/23/2014 0656   CALCIUM 9.1 05/23/2014 0656   GFRNONAA 66* 05/23/2014 0656   GFRAA 77* 05/23/2014 1638  ASSESSMENT AND PLAN Complete heart block She is pacemaker dependent. Normal device function. Remote checks every 3 months, yearly follow-up in clinic  Pacemaker - dual-chamber Medtronic Remote pacemaker checks every 3 months, followup in the office in 6 months  Paroxysmal atrial fibrillation CHADSVasc score is 3 and statistically she would benefit from using a true anticoagulants rather than aspirin. Statistically she is more likely to have an ischemic stroke than a series bleeding complication. She remains resistant to the recommendation to start anticoagulation therapy.. I have told her to seek immediate medical attention for any focal neurological deficits. If she should have a stroke or TIA, the risk-benefit balance with tilt even more heavily in the direction of anticoagulants.  HTN (hypertension) Marginally high  blood pressure today. No changes are made her medications. It is important that her beta blocker not be stopped abruptly, to avoid rebound arrhythmia and rebound hypertension.  Orders Placed This Encounter  Procedures  . Implantable device check  . EKG 12-Lead   No orders of the defined types were placed in this encounter.    Holli Humbles, MD, [place] [phone] office [phone] pager

## 2014-08-20 ENCOUNTER — Encounter: Payer: Self-pay | Admitting: Cardiovascular Disease

## 2014-09-09 ENCOUNTER — Ambulatory Visit (INDEPENDENT_AMBULATORY_CARE_PROVIDER_SITE_OTHER): Payer: Medicare Other | Admitting: Podiatry

## 2014-09-09 ENCOUNTER — Encounter: Payer: Self-pay | Admitting: Podiatry

## 2014-09-09 VITALS — BP 154/81 | HR 78

## 2014-09-09 DIAGNOSIS — L600 Ingrowing nail: Secondary | ICD-10-CM | POA: Diagnosis not present

## 2014-09-09 NOTE — Progress Notes (Signed)
   Subjective:    Patient ID: Kathryn [name], female    DOB: 13-Sep-1927, 81 y.o.   MRN: [id number]  HPI Patient in today with C/O right great toe pain, some redness for the last 2 weeks.  Hurts worse in the morning with the weight of the cover.   Review of Systems  All other systems reviewed and are negative.      Objective:   Physical Exam        Assessment & Plan:

## 2014-09-09 NOTE — Patient Instructions (Signed)

## 2014-09-11 NOTE — Progress Notes (Signed)
Subjective:     Patient ID: Kathryn [name], female   DOB: 02/16/1928, 81 y.o.   MRN: [id number]  HPI patient points with a painful right hallux nail medial border stating that it's making it hard for her to wear shoe gear with comfortably   Review of Systems  All other systems reviewed and are negative.      Objective:   Physical Exam  Constitutional: She is oriented to person, place, and time.  Cardiovascular: Intact distal pulses.   Musculoskeletal: Normal range of motion.  Neurological: She is oriented to person, place, and time.  Skin: Skin is warm.  Nursing note and vitals reviewed.  neurovascular status intact with muscle strength adequate and range of motion subtalar midtarsal joint within normal limits. Patient's noted to have incurvated medial border right hallux that's painful when pressed and makes shoe gear difficult. Patient states that she's tried different treatment options without relief and tried to soak it and trim it. Her digits are well-perfused and she's well oriented 3     Assessment:     Ingrown toenail deformity right hallux medial border that's painful when pressed    Plan:     H&P and condition discussed. I went ahead today and recommended permanent correction and explained procedure and risk. I infiltrated the right hallux 60 mg Xylocaine Marcaine mixture remove the medial border exposed matrix and applied phenol 3 applications 30 seconds followed by alcohol lavaged and sterile dressing. Instructed on soaks and reappoint

## 2014-09-16 ENCOUNTER — Ambulatory Visit (INDEPENDENT_AMBULATORY_CARE_PROVIDER_SITE_OTHER): Payer: Medicare Other | Admitting: Podiatry

## 2014-09-16 ENCOUNTER — Encounter: Payer: Self-pay | Admitting: Podiatry

## 2014-09-16 VITALS — BP 150/84 | HR 85 | Temp 99.6°F | Resp 16

## 2014-09-16 DIAGNOSIS — L03011 Cellulitis of right finger: Secondary | ICD-10-CM

## 2014-09-16 DIAGNOSIS — L03031 Cellulitis of right toe: Secondary | ICD-10-CM

## 2014-09-16 MED ORDER — CEPHALEXIN 500 MG PO CAPS: 500.0000 mg | ORAL_CAPSULE | Freq: Three times a day (TID) | ORAL | Status: DC

## 2014-09-17 NOTE — Progress Notes (Signed)
Subjective:     Patient ID: Kathryn [name], female   DOB: 12/20/1927, 81 y.o.   MRN: [id number]  HPI patient states that her right big toe has been irritating her and has been draining a little bit and she just wanted to get it checked   Review of Systems     Objective:   Physical Exam Neurovascular status intact with patient several weeks after having toenail correction right hallux medial border that is healed well with some drainage localized no odor noted and no proximal edema erythema drainage noted    Assessment:     May have mild localized infective process right    Plan:     Reviewed condition and at this time I went ahead and advised on soaks and I advised on antibiotic and placed on cephalexin 500 mg 3 times a day for a week and gave strict instructions to call us or go to the emergency room if proximal edema were to occur or if she should develop any systemic indications of infection

## 2014-09-29 ENCOUNTER — Ambulatory Visit (INDEPENDENT_AMBULATORY_CARE_PROVIDER_SITE_OTHER): Payer: Medicare Other | Admitting: Podiatry

## 2014-09-29 ENCOUNTER — Encounter: Payer: Self-pay | Admitting: Podiatry

## 2014-09-29 VITALS — BP 156/80 | HR 86 | Resp 18

## 2014-09-29 DIAGNOSIS — L03031 Cellulitis of right toe: Secondary | ICD-10-CM | POA: Diagnosis not present

## 2014-09-29 DIAGNOSIS — L03011 Cellulitis of right finger: Secondary | ICD-10-CM

## 2014-09-29 NOTE — Patient Instructions (Addendum)

## 2014-09-29 NOTE — Progress Notes (Signed)
   Subjective:    Patient ID: Kathryn [name], female    DOB: December 17, 1927, 81 y.o.   MRN: [id number]  HPI  81 year old female presents the office today for complaints of right big toenail infection. She previously underwent partial nail avulsion with chemical matrixectomy on March 3. She states that at last point she did have a little localized infection for which she was prescribed Keflex for. She states that he was unable to medications it was too strong. She states that over the last couple days she's noticed increasing drainage and redness around the base of the toe nail. She denies any systemic complaints as fevers, chills, nausea, vomiting.   Review of Systems  All other systems reviewed and are negative.      Objective:   Physical Exam AAO x3, NAD DP/PT pulses palpable bilaterally, CRT less than 3 seconds Protective sensation intact with Simms Weinstein monofilament, vibratory sensation intact, Achilles tendon reflex intact Right hallux toenail is loose and the underlying nail bed and only adhered on the proximal lateral border. There is tenderness to palpation overlying the entire nail bed. There is granular tissue within the procedure site on the medial nail border. There is purulence expressed from the proximal nail border on the toenail. There is relative erythema extending along the proximal nail border without any ascending cellulitis. There is no areas of fluctuance, crepitus, malodor. No other areas of tenderness to bilateral lower extremities. No open lesions or pre-ulcerative lesions.  No overlying edema, erythema, increase in warmth to bilateral lower extremities.  No pain with calf compression, swelling, warmth, erythema bilaterally.      Assessment & Plan:  81 year old female right hallux paronychia -Treatment options were discussed the patient Kling alternatives, risks, complications. -At this time, recommended total nail removal without chemical matricectomy to the  right hallux due to infection. Risks and complications were discussed with the patient for which they understand and  verbally consent to the procedure. Under sterile conditions a total of 3 mL of a mixture of 2% lidocaine plain and 0.5% Marcaine plain was infiltrated in a hallux block fashion. Once anesthetized, the skin was prepped in sterile fashion. Next the hallux nail  was excised making sure to remove the entire offending nail borders. There was purulence expressed from the nail borders. Once the nail was  removed, the area was debrided and the underlying skin was intact. The area was irrigated and hemostasis was obtained.  A dry sterile dressing was applied. After application of the dressing the tourniquet was removed and there is found to be an immediate capillary refill time to the digit. The patient tolerated the procedure well any complications. Post procedure instructions were discussed the patient for which he verbally understood. Follow-up in one week for nail check or sooner if any problems are to arise. Discussed signs/symptoms of worsening infection and directed to call the office immediately should any occur or go directly to the emergency room. In the meantime, encouraged to call the office with any questions, concerns, changes symptoms.  -Recommended to take Keflex twice a day suppose it 3 times a day. Its unable to tolerate this medication and call the office.

## 2014-10-07 ENCOUNTER — Encounter: Payer: Self-pay | Admitting: Podiatry

## 2014-10-07 ENCOUNTER — Ambulatory Visit (INDEPENDENT_AMBULATORY_CARE_PROVIDER_SITE_OTHER): Payer: Medicare Other | Admitting: Podiatry

## 2014-10-07 DIAGNOSIS — L600 Ingrowing nail: Secondary | ICD-10-CM

## 2014-10-07 NOTE — Progress Notes (Signed)
Subjective:     Patient ID: Kathryn [name], female   DOB: 12-01-1927, 81 y.o.   MRN: [id number]  HPI patient presents stating this nail I just wanted to get it checked after the procedure there is been some redness and some drainage and I wanted to make sure it was okay   Review of Systems     Objective:   Physical Exam Neurovascular status intact with a crusted nail corner secondary to nail removal that's localized with no proximal edema erythema drainage    Assessment:     Localized paronychia infection    Plan:     Advised on physical therapy for this trim the area out and reappoint to recheck again if symptoms were to get worse

## 2014-10-11 ENCOUNTER — Telehealth: Payer: Self-pay | Admitting: Cardiovascular Disease

## 2014-10-11 NOTE — Telephone Encounter (Signed)
Informed pt that she received her wirex and how to use to use the wirex.

## 2014-10-11 NOTE — Telephone Encounter (Signed)
PT CALLING RE DEVICE SHE RECIEVED IN THE MAIL AND DOESN'T KNOW IF ITS HERS OR WAS A MISTAKE--PLS ADVISE

## 2014-11-09 ENCOUNTER — Telehealth: Payer: Self-pay | Admitting: Cardiology

## 2014-11-09 ENCOUNTER — Encounter: Payer: Medicare Other | Admitting: *Deleted

## 2014-11-09 NOTE — Telephone Encounter (Signed)
Attempted to confirm remote transmission with pt. No answer and was unable to leave a message.   

## 2014-11-10 ENCOUNTER — Encounter: Payer: Self-pay | Admitting: Cardiology

## 2014-11-11 ENCOUNTER — Telehealth: Payer: Self-pay | Admitting: Cardiovascular Disease

## 2014-11-11 NOTE — Telephone Encounter (Signed)
Attempted to return call. Mailbox full.   Remote NOT received as of 11/11/14.

## 2014-11-11 NOTE — Telephone Encounter (Signed)
Follow Up  Pt called back to discuss transmission//sr

## 2014-11-12 NOTE — Telephone Encounter (Signed)
Called home phone, spouse instructed me to call son's phone.  LMVOM w/ son's phone w/ my direct #.

## 2014-12-01 ENCOUNTER — Telehealth: Payer: Self-pay | Admitting: Cardiovascular Disease

## 2014-12-01 NOTE — Telephone Encounter (Signed)
New message ° ° ° ° ° °Did you get her remote transmission? °

## 2014-12-02 NOTE — Telephone Encounter (Signed)
Attempted to call pt. No answer and unable to leave a message. Will try again later.

## 2014-12-03 NOTE — Telephone Encounter (Signed)
Attempted to return pt call. No answer and unable to leave a message.  

## 2015-01-05 ENCOUNTER — Encounter (INDEPENDENT_AMBULATORY_CARE_PROVIDER_SITE_OTHER): Payer: Medicare Other | Admitting: Ophthalmology

## 2015-01-17 ENCOUNTER — Encounter (INDEPENDENT_AMBULATORY_CARE_PROVIDER_SITE_OTHER): Payer: Medicare Other | Admitting: Ophthalmology

## 2015-01-17 DIAGNOSIS — H26491 Other secondary cataract, right eye: Secondary | ICD-10-CM | POA: Diagnosis not present

## 2015-01-17 DIAGNOSIS — H43813 Vitreous degeneration, bilateral: Secondary | ICD-10-CM | POA: Diagnosis not present

## 2015-02-09 ENCOUNTER — Encounter (HOSPITAL_COMMUNITY): Payer: Self-pay | Admitting: Emergency Medicine

## 2015-02-09 ENCOUNTER — Emergency Department (HOSPITAL_COMMUNITY)
Admission: EM | Admit: 2015-02-09 | Discharge: 2015-02-09 | Disposition: A | Discharge status: Home / Self Care | Payer: Medicare Other | Attending: Emergency Medicine | Admitting: Emergency Medicine

## 2015-02-09 ENCOUNTER — Emergency Department (HOSPITAL_COMMUNITY): Payer: Medicare Other

## 2015-02-09 DIAGNOSIS — M1990 Unspecified osteoarthritis, unspecified site: Secondary | ICD-10-CM | POA: Insufficient documentation

## 2015-02-09 DIAGNOSIS — Z7982 Long term (current) use of aspirin: Secondary | ICD-10-CM | POA: Insufficient documentation

## 2015-02-09 DIAGNOSIS — Y92129 Unspecified place in nursing home as the place of occurrence of the external cause: Secondary | ICD-10-CM | POA: Insufficient documentation

## 2015-02-09 DIAGNOSIS — S52502A Unspecified fracture of the lower end of left radius, initial encounter for closed fracture: Secondary | ICD-10-CM | POA: Insufficient documentation

## 2015-02-09 DIAGNOSIS — Z79899 Other long term (current) drug therapy: Secondary | ICD-10-CM | POA: Insufficient documentation

## 2015-02-09 DIAGNOSIS — S62102A Fracture of unspecified carpal bone, left wrist, initial encounter for closed fracture: Secondary | ICD-10-CM

## 2015-02-09 DIAGNOSIS — W010XXA Fall on same level from slipping, tripping and stumbling without subsequent striking against object, initial encounter: Secondary | ICD-10-CM | POA: Insufficient documentation

## 2015-02-09 DIAGNOSIS — Y939 Activity, unspecified: Secondary | ICD-10-CM | POA: Insufficient documentation

## 2015-02-09 DIAGNOSIS — Y999 Unspecified external cause status: Secondary | ICD-10-CM | POA: Insufficient documentation

## 2015-02-09 DIAGNOSIS — Z950 Presence of cardiac pacemaker: Secondary | ICD-10-CM | POA: Insufficient documentation

## 2015-02-09 DIAGNOSIS — K219 Gastro-esophageal reflux disease without esophagitis: Secondary | ICD-10-CM | POA: Insufficient documentation

## 2015-02-09 DIAGNOSIS — Z86718 Personal history of other venous thrombosis and embolism: Secondary | ICD-10-CM | POA: Insufficient documentation

## 2015-02-09 DIAGNOSIS — I10 Essential (primary) hypertension: Secondary | ICD-10-CM | POA: Insufficient documentation

## 2015-02-09 MED ORDER — TETANUS-DIPHTH-ACELL PERTUSSIS 5-2.5-18.5 LF-MCG/0.5 IM SUSP
0.5000 mL | Freq: Once | INTRAMUSCULAR | Status: AC
  Administered 2015-02-09: 0.5 mL via INTRAMUSCULAR
  Filled 2015-02-09: qty 0.5

## 2015-02-09 MED ORDER — KETAMINE HCL 10 MG/ML IJ SOLN
100.0000 mg | Freq: Once | INTRAMUSCULAR | Status: AC
  Administered 2015-02-09: 50 mg via INTRAVENOUS
  Filled 2015-02-09: qty 10

## 2015-02-09 MED ORDER — CEPHALEXIN 500 MG PO CAPS: 500.0000 mg | ORAL_CAPSULE | Freq: Three times a day (TID) | ORAL | Status: DC

## 2015-02-09 MED ORDER — ACETAMINOPHEN 500 MG PO TABS: 500.0000 mg | ORAL_TABLET | Freq: Four times a day (QID) | ORAL | Status: DC | PRN

## 2015-02-09 MED ORDER — LIDOCAINE HCL 2 % IJ SOLN
10.0000 mL | Freq: Once | INTRAMUSCULAR | Status: AC
  Administered 2015-02-09: 10 mg via INTRADERMAL
  Filled 2015-02-09: qty 20

## 2015-02-09 MED ORDER — ACETAMINOPHEN 500 MG PO TABS
1000.0000 mg | ORAL_TABLET | Freq: Once | ORAL | Status: AC
  Administered 2015-02-09: 1000 mg via ORAL
  Filled 2015-02-09: qty 2

## 2015-02-09 MED ORDER — MORPHINE SULFATE 4 MG/ML IJ SOLN: 6.0000 mg | Freq: Once | INTRAMUSCULAR | Status: DC

## 2015-02-09 MED ORDER — MORPHINE SULFATE 4 MG/ML IJ SOLN
4.0000 mg | Freq: Once | INTRAMUSCULAR | Status: AC
  Administered 2015-02-09: 4 mg via INTRAVENOUS
  Filled 2015-02-09: qty 1

## 2015-02-09 NOTE — Progress Notes (Signed)
Orthopedic Tech Progress Note Patient Details:  Kathryn [name] 09/17/1927 [id number]  Ortho Devices Type of Ortho Device: Ace wrap, Arm sling, Sugartong splint Ortho Device/Splint Interventions: Application   Cammer, Theodoro Parma 02/09/2015, 8:11 PM

## 2015-02-09 NOTE — Discharge Instructions (Signed)
Cast or Splint Care °Casts and splints support injured limbs and keep bones from moving while they heal. It is important to care for your cast or splint at home.   °HOME CARE INSTRUCTIONS °· Keep the cast or splint uncovered during the drying period. It can take 24 to 48 hours to dry if it is made of plaster. A fiberglass cast will dry in less than 1 hour. °· Do not rest the cast on anything harder than a pillow for the first 24 hours. °· Do not put weight on your injured limb or apply pressure to the cast until your health care provider gives you permission. °· Keep the cast or splint dry. Wet casts or splints can lose their shape and may not support the limb as well. A wet cast that has lost its shape can also create harmful pressure on your skin when it dries. Also, wet skin can become infected. °· Cover the cast or splint with a plastic bag when bathing or when out in the rain or snow. If the cast is on the trunk of the body, take sponge baths until the cast is removed. °· If your cast does become wet, dry it with a towel or a blow dryer on the cool setting only. °· Keep your cast or splint clean. Soiled casts may be wiped with a moistened cloth. °· Do not place any hard or soft foreign objects under your cast or splint, such as cotton, toilet paper, lotion, or powder. °· Do not try to scratch the skin under the cast with any object. The object could get stuck inside the cast. Also, scratching could lead to an infection. If itching is a problem, use a blow dryer on a cool setting to relieve discomfort. °· Do not trim or cut your cast or remove padding from inside of it. °· Exercise all joints next to the injury that are not immobilized by the cast or splint. For example, if you have a long leg cast, exercise the hip joint and toes. If you have an arm cast or splint, exercise the shoulder, elbow, thumb, and fingers. °· Elevate your injured arm or leg on 1 or 2 pillows for the first 1 to 3 days to decrease  swelling and pain. It is best if you can comfortably elevate your cast so it is higher than your heart. °SEEK MEDICAL CARE IF:  °· Your cast or splint cracks. °· Your cast or splint is too tight or too loose. °· You have unbearable itching inside the cast. °· Your cast becomes wet or develops a soft spot or area. °· You have a bad smell coming from inside your cast. °· You get an object stuck under your cast. °· Your skin around the cast becomes red or raw. °· You have new pain or worsening pain after the cast has been applied. °SEEK IMMEDIATE MEDICAL CARE IF:  °· You have fluid leaking through the cast. °· You are unable to move your fingers or toes. °· You have discolored (blue or white), cool, painful, or very swollen fingers or toes beyond the cast. °· You have tingling or numbness around the injured area. °· You have severe pain or pressure under the cast. °· You have any difficulty with your breathing or have shortness of breath. °· You have chest pain. °Document Released: 06/22/2000 Document Revised: 04/15/2013 Document Reviewed: 01/01/2013 °ExitCare® Patient Information ©2015 [employer]. This information is not intended to replace advice given to you by your health care   provider. Make sure you discuss any questions you have with your health care provider. ° °Wrist Fracture °A wrist fracture is a break or crack in one of the bones of your wrist. Your wrist is made up of eight small bones at the palm of your hand (carpal bones) and two long bones that make up your forearm (radius and ulna).  °CAUSES  °· A direct blow to the wrist. °· Falling on an outstretched hand. °· Trauma, such as a car accident or a fall. °RISK FACTORS °Risk factors for wrist fracture include:  °· Participating in contact and high-risk sports, such as skiing, biking, and ice skating. °· Taking steroid medicines. °· Smoking. °· Being female. °· Being Caucasian. °· Drinking more than three alcoholic beverages per day. °· Having low or  lowered bone density (osteoporosis or osteopenia). °· Age. Older adults have decreased bone density. °· Women who have had menopause. °· History of previous fractures. °SIGNS AND SYMPTOMS °Symptoms of wrist fractures include tenderness, bruising, and inflammation. Additionally, the wrist may hang in an odd position or appear deformed.  °DIAGNOSIS °Diagnosis may include: °· Physical exam. °· X-ray. °TREATMENT °Treatment depends on many factors, including the nature and location of the fracture, your age, and your activity level. Treatment for wrist fracture can be nonsurgical or surgical.  °Nonsurgical Treatment °A plaster cast or splint may be applied to your wrist if the bone is in a good position. If the fracture is not in good position, it may be necessary for your health care provider to realign it before applying a splint or cast. Usually, a cast or splint will be worn for several weeks.  °Surgical Treatment °Sometimes the position of the bone is so far out of place that surgery is required to apply a device to hold it together as it heals. Depending on the fracture, there are a number of options for holding the bone in place while it heals, such as a cast and metal pins.   °HOME CARE INSTRUCTIONS °· Keep your injured wrist elevated and move your fingers as much as possible. °· Do not put pressure on any part of your cast or splint. It may break.   °· Use a plastic bag to protect your cast or splint from water while bathing or showering. Do not lower your cast or splint into water. °· Take medicines only as directed by your health care provider. °· Keep your cast or splint clean and dry. If it becomes wet, damaged, or suddenly feels too tight, contact your health care provider right away. °· Do not use any tobacco products including cigarettes, chewing tobacco, or electronic cigarettes. Tobacco can delay bone healing. If you need help quitting, ask your health care provider. °· Keep all follow-up visits as  directed by your health care provider. This is important. °· Ask your health care provider if you should take supplements of calcium and vitamins C and D to promote bone healing. °SEEK MEDICAL CARE IF:  °· Your cast or splint is damaged, breaks, or gets wet. °· You have a fever. °· You have chills. °· You have continued severe pain or more swelling than you did before the cast was put on. °SEEK IMMEDIATE MEDICAL CARE IF:  °· Your hand or fingernails on the injured arm turn blue or gray, or feel cold or numb. °· You have decreased feeling in the fingers of your injured arm. °MAKE SURE YOU: °· Understand these instructions. °· Will watch your condition. °· Will get help   right away if you are not doing well or get worse. °Document Released: 04/04/2005 Document Revised: 11/09/2013 Document Reviewed: 07/13/2011 °ExitCare® Patient Information ©2015 [employer]. This information is not intended to replace advice given to you by your health care provider. Make sure you discuss any questions you have with your health care provider. ° °

## 2015-02-09 NOTE — ED Provider Notes (Signed)
CSN: [id number]     Arrival date & time 02/09/15  1720 History   First MD Initiated Contact with Patient 02/09/15 1733     Chief Complaint  Patient presents with  . Fall  . Wrist Injury     (Consider location/radiation/quality/duration/timing/severity/associated sxs/prior Treatment) HPI  Jaiana S Grubb is a 81 y.o. female with no significant past medical history presenting today after a fall. Patient states she was visiting her husband at a nursing facility, she turned around and tripped over a trash can. She fell and landed on her left wrist. She states she has pain at the wrist joint and distally to her fourth and fifth digits. She denies hitting her head or loss of consciousness. She does not take any blood thinners. Patient has no further complaints. EMS gave her 50 g of fentanyl for pain control.  10 Systems reviewed and are negative for acute change except as noted in the HPI.    Past Medical History  Diagnosis Date  . DVT (deep venous thrombosis) 2010  . Bradycardia   . Syncope     permanent pacemaker 2008  . Presence of permanent cardiac pacemaker     COMPLETE HEART BLOCK--DR. CROITORU   . Dysrhythmia     HX OF PAROXYSMAL ATRIAL FIB- TAKES ASPIRIN FOR BLOOD THINNER  . Hypertension   . GERD (gastroesophageal reflux disease)   . Arthritis     HANDS  . Colostomy in place     S/P COLON RESECTION AND COLOSTOMY 08/27/13 FOR COLON PERFORATION   Past Surgical History  Procedure Laterality Date  . Pacemaker insertion  12/02/1998    medtronic  . Cholecystectomy    . Abdominal hysterectomy    . Appendectomy    . Tonsillectomy    . Cataract extraction, bilateral    . Laparotomy N/A 08/27/2013    Procedure: LAPAROSCOPY CONVERTED TO OPEN LAPAROTOMY WITH SIGMOID COLECTOMY AND COLOSTOMY;  Surgeon: Shann Medal, MD;  Location: [facility];  Service: General;  Laterality: N/A;  . Permanent pacemaker generator change  02/28/2007    Medtronic Adapta  . Breast enhancement surgery    . US  echocardiography  10/09/2011    mild LAE, mild MR,mild to mod TR  . Colonoscopy N/A 03/05/2014    Procedure: COLONOSCOPY;  Surgeon: Shann Medal, MD;  Location: [facility];  Service: General;  Laterality: N/A;  . Colostomy closure N/A 05/21/2014    Procedure: LAPAROSCOPIC ASSISTED CLOSURE OF COLOSTOMY AND REPAIR OF TRANSVERSE COLON AND ENTEROTOMY;  Surgeon: Alphonsa Overall, MD;  Location: [facility];  Service: General;  Laterality: N/A;   Family History  Problem Relation Age of Onset  . Heart disease Mother   . Heart disease Father    History  Substance Use Topics  . Smoking status: Never Smoker   . Smokeless tobacco: Never Used  . Alcohol Use: No   OB History    No data available     Review of Systems    Allergies  Codeine; Darvocet; Epinephrine; and Percocet  Home Medications   Prior to Admission medications   Medication Sig Start Date End Date Taking? Authorizing Provider  aspirin EC 81 MG tablet Take 81 mg by mouth every morning.    Yes Historical Provider, MD  Calcium Carbonate-Vitamin D (CALTRATE 600+D PO) Take 1 tablet by mouth every morning.   Yes Historical Provider, MD  metoprolol tartrate (LOPRESSOR) 25 MG tablet Take 12.5 mg by mouth 2 (two) times daily.  08/11/13  Yes Historical Provider,  MD  valsartan (DIOVAN) 80 MG tablet Take 0.5 tablets by mouth every morning.  07/25/13  Yes Historical Provider, MD  vitamin C (ASCORBIC ACID) 500 MG tablet Take 500 mg by mouth every morning.   Yes Historical Provider, MD  cephALEXin (KEFLEX) 500 MG capsule Take 1 capsule (500 mg total) by mouth 3 (three) times daily. Patient not taking: Reported on 10/07/2014 09/16/14   Tamala Fothergill Regal, DPM   BP 168/70 mmHg  Pulse 71  Temp(Src) 98 F (36.7 C) (Oral)  Resp 21  SpO2 100% Physical Exam  Constitutional: She is oriented to person, place, and time. She appears well-developed and well-nourished. No distress.  HENT:  Head: Normocephalic and atraumatic.  Nose: Nose normal.   Mouth/Throat: Oropharynx is clear and moist. No oropharyngeal exudate.  Eyes: Conjunctivae and EOM are normal. Pupils are equal, round, and reactive to light. No scleral icterus.  Neck: Normal range of motion. Neck supple. No JVD present. No tracheal deviation present. No thyromegaly present.  Cardiovascular: Normal rate, regular rhythm and normal heart sounds.  Exam reveals no gallop and no friction rub.   No murmur heard. Pulmonary/Chest: Effort normal and breath sounds normal. No respiratory distress. She has no wheezes. She exhibits no tenderness.  Abdominal: Soft. Bowel sounds are normal. She exhibits no distension and no mass. There is no tenderness. There is no rebound and no guarding.  Musculoskeletal: She exhibits edema and tenderness.  Obvious deformity and swelling to the left wrist.  Normal pulses and sensation distally.   Lymphadenopathy:    She has no cervical adenopathy.  Neurological: She is alert and oriented to person, place, and time. No cranial nerve deficit. She exhibits normal muscle tone.  Skin: Skin is warm and dry. No rash noted. No erythema. No pallor.  Nursing note and vitals reviewed.   ED Course  Procedures (including critical care time) Labs Review Labs Reviewed - No data to display  Imaging Review No results found.   EKG Interpretation None      MDM   Final diagnoses:  None    Patient since emergency department after a fall. There is obvious deformity to her left wrist. X-ray reveals displaced fracture of the distal radius. I have paged Dr. Grandville Silos for consultation.  Dr. Grandville Silos recommends to reduce the patient and he will see them in the ED.  This was performed.    Dr. Grandville Silos has evaluated the patient in the ED.  Reduction was repeated by him for better alignment.  He was written follow up and DC instructions.  I appreciate the help from Dr. Grandville Silos.  Procedural sedation Performed by: Everlene Balls Consent: Verbal consent  obtained. Risks and benefits: risks, benefits and alternatives were discussed Required items: required blood products, implants, devices, and special equipment available Patient identity confirmed: arm band and provided demographic data Time out: Immediately prior to procedure a "time out" was called to verify the correct patient, procedure, equipment, support staff and site/side marked as required.  Sedation type: moderate (conscious) sedation NPO time confirmed and considedered  Sedatives: [place]   Physician Time at Bedside: 38min  Vitals: Vital signs were monitored during sedation. Cardiac Monitor, pulse oximeter Patient tolerance: Patient tolerated the procedure well with no immediate complications. Comments: Pt with uneventful recovered. Returned to pre-procedural sedation baseline   Reduction of fracture Date/Time: 8:24 PM Performed by: Everlene Balls Authorized byEverlene Balls Consent: Verbal consent obtained. Risks and benefits: risks, benefits and alternatives were discussed Consent given by: patient Required items: required blood products,  implants, devices, and special equipment available Time out: Immediately prior to procedure a "time out" was called to verify the correct patient, procedure, equipment, support staff and site/side marked as required.  Patient sedated: Ketamine  Vitals: Vital signs were monitored during sedation. Patient tolerance: Patient tolerated the procedure well with no immediate complications. Joint: L wrist Reduction technique:manual manipulation    Everlene Balls, MD 02/09/15 2104

## 2015-02-09 NOTE — ED Notes (Signed)
Bed: WA18 Expected date:  Expected time:  Means of arrival:  Comments: RESA 

## 2015-02-09 NOTE — ED Notes (Addendum)
Per EMS: pt was visiting family member and had a mechanical fall. Pt has deformity to left wrist, 20 g in right forearm 50 mcg Fentanyl given in route.

## 2015-02-09 NOTE — ED Notes (Signed)
Bed: RESA Expected date:  Expected time:  Means of arrival:  Comments: EMS - fall, wrist deformity

## 2015-02-09 NOTE — Progress Notes (Signed)
CSW met with patient at bedside. Son was present. Patient informed CSW that she comes from home. Son states that he and patient's husband live with patient in [place]. He states that the patient has someone there with her 24 hours a day.  Patient informed CSW that she feels drowsy at bedside from medications.   Patient confirms that she presents to [facility] due to falling. Patient states that she has fallen x1 or x2 in 6 months. Prior to presenting to [facility] son states that the patient was able to complete her ADL's independently.   Patient and son state that they do not have any questions at this time.  Willette Brace [phone] ED CSW 02/09/2015 9:53 PM

## 2015-02-09 NOTE — Consult Note (Signed)
ORTHOPAEDIC CONSULTATION HISTORY & PHYSICAL REQUESTING PHYSICIAN: Everlene Balls, MD  Chief Complaint: left wrist fracture  HPI: Kathryn [name] is a 81 y.o. female who fell onto an outstretched left hand, sustaining an injury. She had a small break in the skin ulnarly. Otherwise she describes pain, swelling, deformity. Denies loss of consciousness. Husband is presently in rehabilitation.  Past Medical History  Diagnosis Date  . DVT (deep venous thrombosis) 2010  . Bradycardia   . Syncope     permanent pacemaker 2008  . Presence of permanent cardiac pacemaker     COMPLETE HEART BLOCK--DR. CROITORU   . Dysrhythmia     HX OF PAROXYSMAL ATRIAL FIB- TAKES ASPIRIN FOR BLOOD THINNER  . Hypertension   . GERD (gastroesophageal reflux disease)   . Arthritis     HANDS  . Colostomy in place     S/P COLON RESECTION AND COLOSTOMY 08/27/13 FOR COLON PERFORATION   Past Surgical History  Procedure Laterality Date  . Pacemaker insertion  12/02/1998    medtronic  . Cholecystectomy    . Abdominal hysterectomy    . Appendectomy    . Tonsillectomy    . Cataract extraction, bilateral    . Laparotomy N/A 08/27/2013    Procedure: LAPAROSCOPY CONVERTED TO OPEN LAPAROTOMY WITH SIGMOID COLECTOMY AND COLOSTOMY;  Surgeon: Shann Medal, MD;  Location: [facility];  Service: General;  Laterality: N/A;  . Permanent pacemaker generator change  02/28/2007    Medtronic Adapta  . Breast enhancement surgery    . US echocardiography  10/09/2011    mild LAE, mild MR,mild to mod TR  . Colonoscopy N/A 03/05/2014    Procedure: COLONOSCOPY;  Surgeon: Shann Medal, MD;  Location: [facility];  Service: General;  Laterality: N/A;  . Colostomy closure N/A 05/21/2014    Procedure: LAPAROSCOPIC ASSISTED CLOSURE OF COLOSTOMY AND REPAIR OF TRANSVERSE COLON AND ENTEROTOMY;  Surgeon: Alphonsa Overall, MD;  Location: [facility];  Service: General;  Laterality: N/A;   History   Social History  . Marital Status: Married    Spouse Name:  N/A  . Number of Children: N/A  . Years of Education: N/A   Social History Main Topics  . Smoking status: Never Smoker   . Smokeless tobacco: Never Used  . Alcohol Use: No  . Drug Use: No  . Sexual Activity: No   Other Topics Concern  . None   Social History Narrative   Family History  Problem Relation Age of Onset  . Heart disease Mother   . Heart disease Father    Allergies  Allergen Reactions  . Codeine     NOT SURE WHAT THE REACTION WAS  . Darvocet [Propoxyphene N-Acetaminophen] Other (See Comments)    Hallucination   . Epinephrine Other (See Comments)    Severe increase in heart rate  . Percocet [Oxycodone-Acetaminophen] Other (See Comments)    Hallucinations    Prior to Admission medications   Medication Sig Start Date End Date Taking? Authorizing Provider  aspirin EC 81 MG tablet Take 81 mg by mouth every morning.    Yes Historical Provider, MD  Calcium Carbonate-Vitamin D (CALTRATE 600+D PO) Take 1 tablet by mouth every morning.   Yes Historical Provider, MD  metoprolol tartrate (LOPRESSOR) 25 MG tablet Take 12.5 mg by mouth 2 (two) times daily.  08/11/13  Yes Historical Provider, MD  valsartan (DIOVAN) 80 MG tablet Take 0.5 tablets by mouth every morning.  07/25/13  Yes Historical Provider, MD  vitamin C (  ASCORBIC ACID) 500 MG tablet Take 500 mg by mouth every morning.   Yes Historical Provider, MD  acetaminophen (TYLENOL) 500 MG tablet Take 1 tablet (500 mg total) by mouth every 6 (six) hours as needed. 02/09/15   Milly Jakob, MD  cephALEXin (KEFLEX) 500 MG capsule Take 1 capsule (500 mg total) by mouth 3 (three) times daily. 02/09/15   Milly Jakob, MD   Dg Wrist Complete Left  02/09/2015   CLINICAL DATA:  Post reduction left wrist fracture.  EXAM: LEFT WRIST - COMPLETE 3+ VIEW  COMPARISON:  Earlier same day  FINDINGS: Overlying cast obscures evaluation of bony detail. There is evidence of patient's distal radial fracture post reduction with minimal residual  dorsal angulation of the distal fragment. Stable possible displaced ulnar styloid fracture which may be acute or chronic. Remainder the exam is unchanged.  IMPRESSION: Evidence of patient's distal radial fracture post reduction with minimal residual dorsal angulation of the distal fragment.   Electronically Signed   By: Marin Olp M.D.   On: 02/09/2015 20:10   Dg Wrist Complete Left  02/09/2015   CLINICAL DATA:  Patient status post fall at rehab center. Severe left breast pain. Initial encounter.  EXAM: LEFT WRIST - COMPLETE 3+ VIEW  COMPARISON:  Wrist radiograph 01/11/2004.  FINDINGS: There is a dorsally displaced comminuted fracture through distal radius with extension to the radiocarpal joint. Marked degenerative changes at the first [facility] joint. No definite evidence for associated acute fractures.  IMPRESSION: Dorsally displaced comminuted fracture through the distal radius.  No definite evidence for displaced scaphoid fracture. If the patient's tenderness is in the vicinity of the scaphoid/anatomic snuffbox, then cross-sectional imaging or presumptive treatment for occult scaphoid fracture might be considered.   Electronically Signed   By: Lovey Newcomer M.D.   On: 02/09/2015 18:21    Positive ROS: All other systems have been reviewed and were otherwise negative with the exception of those mentioned in the HPI and as above.  Physical Exam: Vitals: Refer to EMR. Constitutional:  WD, WN, NAD HEENT:  NCAT, EOMI Neuro/Psych:  Alert & oriented to person, place, and time; appropriate mood & affect Lymphatic: No generalized extremity edema or lymphadenopathy Extremities / MSK:  The extremities are normal with respect to appearance, ranges of motion, joint stability, muscle strength/tone, sensation, & perfusion except as otherwise noted:  Intact light touch sensibility in the radial, ulnar, median nerve distributions with intact motor to the same. Sugar tong splint is removed, ulnar wound inspected and  found to be superficial, about a centimeter transverse. There is no significant bleeding from the wound. There is no protruding subcutaneous tissues rather deeper structures. The distal radius is obviously shortened, slightly radial angulated and dorsally displaced.  Assessment: Comminuted intra-articular left distal radius fracture with ulnar sided noncommunicating laceration  Plan: I instilled a hematoma block with plain lidocaine and then provided a gentle manipulative reduction. There was a 3-4 cm transverse dorsal skin tear that occurred in the process. This was dressed with a sterile dressing and a sugar tong splint applied. Postreduction radiographs revealed grossly improved alignment in both sagittal and coronal planes. Some residual dorsal translation and dorsal tilt remains. She was instructed regarding elevation, digital motion exercises, and swelling of infection precautions. She'll be discharged at the discretion of emergency department, and our office will call her tomorrow following day to arrange follow-up for next week for a wound check and further discussions regarding definitive management with open treatment.  Incidentally, her tetanus was  updated today. Will place her on some prophylactic oral antibiotics given open wounds under the splint.  In addition, she has not well-tolerated any narcotics in the past with hallucinations and similar reactions, and so we will initially try to go with just Tylenol.  Rayvon Char Grandville Silos, [place], [place]  [id number] Office: [phone] Mobile: [phone]

## 2015-02-10 ENCOUNTER — Other Ambulatory Visit: Payer: Self-pay | Admitting: Orthopedic Surgery

## 2015-02-10 ENCOUNTER — Ambulatory Visit (INDEPENDENT_AMBULATORY_CARE_PROVIDER_SITE_OTHER): Payer: Medicare Other | Admitting: *Deleted

## 2015-02-10 ENCOUNTER — Encounter: Payer: Self-pay | Admitting: Cardiovascular Disease

## 2015-02-10 DIAGNOSIS — I442 Atrioventricular block, complete: Secondary | ICD-10-CM | POA: Diagnosis not present

## 2015-02-10 NOTE — Progress Notes (Signed)
Anesthesia Chart Review:  Pt is 81 year old female scheduled for open treatment of L distal radial fracture on 02/15/2015 with Dr. Cline Cools.   Pt is same day work up.   Cardiologist is Dr. Sallyanne Kuster, last office visit 08/11/14, f/u in 6 months, device transmissions every 3 months. No device transmission in 6 months.   PMH includes: complete heart block, pacemaker dependent (dual chamber Medtronic placed in 2000), PAF, HTN, DVT (2010). Never smoker. BMI 20. S/p lap assisted closure of colostomy and repair of transverse colon and enterotomy 05/21/14. S/p laparoscopy converted to open laparatomy with sigmoid colectomy and colostomy 08/27/13.   Medications include: ASA, keflex, metoprolol, valsartan. Dr. Victorino December note from 08/11/14 indicates pt has refused anticoagulation therapy for afib.   Pre-operative labs will be obtained DOS.   EKG 08/10/2014: AV dual paced rhythm.   Echo 10/09/2011: 1. LVEF >55% 2. Normal diastolic function 3. Mild LAE.  4. RV pacemaker lead 5. Mild MR 6. Mild to moderate TR 7. RVSP 32 mmHg  Cardiac cath 11/29/1998: 1. Angiographically patent coronary arteries 2. Complete heart block, s/p transvenous pacemaker implantation 3. Severe LV dysfunction of a global basis, EF 25%. High LV end-diastolic pressures.  4. Low-normal cardiac output determinations 5. Mildly elevated pulmonary artery and right heart pressures.   Reviewed case with Dr. Deatra Canter. Pt will need cardiac clearance prior to surgery. Left voicemail for [facility] in Dr. Biagio Borg office.   Willeen Cass, FNP-BC [facility]/Anesthesiology Phone: [phone] 02/10/2015 1:52 PM

## 2015-02-10 NOTE — Progress Notes (Signed)
Remote pacemaker transmission.   

## 2015-02-11 ENCOUNTER — Encounter (HOSPITAL_BASED_OUTPATIENT_CLINIC_OR_DEPARTMENT_OTHER): Payer: Self-pay | Admitting: *Deleted

## 2015-02-11 NOTE — Progress Notes (Addendum)
Bring all medications. Faxed Form to Dr. Victorino December office for pacemaker. Pt coming Monday for BMET. Dr. Al Corpus reveiwed EKG and last office notes from Dr. Victorino December office- ok for surgery if form comes back ok for pacemaker.

## 2015-02-14 ENCOUNTER — Encounter (HOSPITAL_BASED_OUTPATIENT_CLINIC_OR_DEPARTMENT_OTHER)
Admission: RE | Admit: 2015-02-14 | Discharge: 2015-02-14 | Disposition: A | Discharge status: Home / Self Care | Payer: Medicare Other | Source: Ambulatory Visit | Attending: Orthopedic Surgery | Admitting: Orthopedic Surgery

## 2015-02-14 ENCOUNTER — Telehealth: Payer: Self-pay | Admitting: *Deleted

## 2015-02-14 ENCOUNTER — Encounter (INDEPENDENT_AMBULATORY_CARE_PROVIDER_SITE_OTHER): Payer: Medicare Other | Admitting: Ophthalmology

## 2015-02-14 DIAGNOSIS — S52502A Unspecified fracture of the lower end of left radius, initial encounter for closed fracture: Secondary | ICD-10-CM | POA: Diagnosis present

## 2015-02-14 DIAGNOSIS — Z7982 Long term (current) use of aspirin: Secondary | ICD-10-CM | POA: Diagnosis not present

## 2015-02-14 DIAGNOSIS — Y92538 Other ambulatory health services establishments as the place of occurrence of the external cause: Secondary | ICD-10-CM | POA: Diagnosis not present

## 2015-02-14 DIAGNOSIS — K219 Gastro-esophageal reflux disease without esophagitis: Secondary | ICD-10-CM | POA: Diagnosis not present

## 2015-02-14 DIAGNOSIS — Z950 Presence of cardiac pacemaker: Secondary | ICD-10-CM | POA: Diagnosis not present

## 2015-02-14 DIAGNOSIS — Z79899 Other long term (current) drug therapy: Secondary | ICD-10-CM | POA: Diagnosis not present

## 2015-02-14 DIAGNOSIS — W19XXXA Unspecified fall, initial encounter: Secondary | ICD-10-CM | POA: Diagnosis not present

## 2015-02-14 DIAGNOSIS — S51812A Laceration without foreign body of left forearm, initial encounter: Secondary | ICD-10-CM | POA: Diagnosis not present

## 2015-02-14 DIAGNOSIS — I10 Essential (primary) hypertension: Secondary | ICD-10-CM | POA: Diagnosis not present

## 2015-02-14 DIAGNOSIS — H2701 Aphakia, right eye: Secondary | ICD-10-CM

## 2015-02-14 DIAGNOSIS — Z86718 Personal history of other venous thrombosis and embolism: Secondary | ICD-10-CM | POA: Diagnosis not present

## 2015-02-14 DIAGNOSIS — I480 Paroxysmal atrial fibrillation: Secondary | ICD-10-CM | POA: Diagnosis not present

## 2015-02-14 LAB — BASIC METABOLIC PANEL
ANION GAP: 9 (ref 5–15)
BUN: 11 mg/dL (ref 6–20)
CALCIUM: 10 mg/dL (ref 8.9–10.3)
CHLORIDE: 102 mmol/L (ref 101–111)
CO2: 27 mmol/L (ref 22–32)
Creatinine, Ser: 0.84 mg/dL (ref 0.44–1.00)
GFR calc Af Amer: >60 mL/min (ref >60)
Glucose, Bld: 109 mg/dL — ABNORMAL HIGH (ref 65–99)
Potassium: 4.3 mmol/L (ref 3.5–5.1)
SODIUM: 138 mmol/L (ref 135–145)

## 2015-02-14 NOTE — Telephone Encounter (Signed)
Perioperative prescription for implanted cardiac device programming faxed. The pt is not pacer dependent. There is normal device function.

## 2015-02-15 ENCOUNTER — Ambulatory Visit (HOSPITAL_BASED_OUTPATIENT_CLINIC_OR_DEPARTMENT_OTHER): Payer: Medicare Other | Admitting: Emergency Medicine

## 2015-02-15 ENCOUNTER — Ambulatory Visit (HOSPITAL_BASED_OUTPATIENT_CLINIC_OR_DEPARTMENT_OTHER)
Admission: RE | Admit: 2015-02-15 | Discharge: 2015-02-15 | Disposition: A | Discharge status: Home / Self Care | Payer: Medicare Other | Source: Ambulatory Visit | Attending: Orthopedic Surgery | Admitting: Orthopedic Surgery

## 2015-02-15 ENCOUNTER — Encounter (HOSPITAL_BASED_OUTPATIENT_CLINIC_OR_DEPARTMENT_OTHER): Payer: Self-pay | Admitting: *Deleted

## 2015-02-15 ENCOUNTER — Encounter (HOSPITAL_BASED_OUTPATIENT_CLINIC_OR_DEPARTMENT_OTHER)
Admission: RE | Disposition: A | Discharge status: Home / Self Care | Payer: Self-pay | Source: Ambulatory Visit | Attending: Orthopedic Surgery

## 2015-02-15 ENCOUNTER — Ambulatory Visit (HOSPITAL_COMMUNITY): Payer: Medicare Other

## 2015-02-15 DIAGNOSIS — Z950 Presence of cardiac pacemaker: Secondary | ICD-10-CM | POA: Diagnosis not present

## 2015-02-15 DIAGNOSIS — S52502A Unspecified fracture of the lower end of left radius, initial encounter for closed fracture: Secondary | ICD-10-CM | POA: Diagnosis not present

## 2015-02-15 DIAGNOSIS — Z7982 Long term (current) use of aspirin: Secondary | ICD-10-CM | POA: Insufficient documentation

## 2015-02-15 DIAGNOSIS — I10 Essential (primary) hypertension: Secondary | ICD-10-CM | POA: Insufficient documentation

## 2015-02-15 DIAGNOSIS — I480 Paroxysmal atrial fibrillation: Secondary | ICD-10-CM | POA: Insufficient documentation

## 2015-02-15 DIAGNOSIS — Z79899 Other long term (current) drug therapy: Secondary | ICD-10-CM | POA: Insufficient documentation

## 2015-02-15 DIAGNOSIS — S51812A Laceration without foreign body of left forearm, initial encounter: Secondary | ICD-10-CM | POA: Insufficient documentation

## 2015-02-15 DIAGNOSIS — T148XXA Other injury of unspecified body region, initial encounter: Secondary | ICD-10-CM

## 2015-02-15 DIAGNOSIS — K219 Gastro-esophageal reflux disease without esophagitis: Secondary | ICD-10-CM | POA: Insufficient documentation

## 2015-02-15 DIAGNOSIS — Z86718 Personal history of other venous thrombosis and embolism: Secondary | ICD-10-CM | POA: Insufficient documentation

## 2015-02-15 DIAGNOSIS — Y92538 Other ambulatory health services establishments as the place of occurrence of the external cause: Secondary | ICD-10-CM | POA: Insufficient documentation

## 2015-02-15 DIAGNOSIS — W19XXXA Unspecified fall, initial encounter: Secondary | ICD-10-CM | POA: Insufficient documentation

## 2015-02-15 HISTORY — DX: Peripheral vascular disease, unspecified: I73.9

## 2015-02-15 HISTORY — PX: OPEN REDUCTION INTERNAL FIXATION (ORIF) DISTAL RADIAL FRACTURE: SHX5989

## 2015-02-15 LAB — POCT HEMOGLOBIN-HEMACUE: HEMOGLOBIN: 10.4 g/dL — AB (ref 12.0–15.0)

## 2015-02-15 SURGERY — OPEN REDUCTION INTERNAL FIXATION (ORIF) DISTAL RADIUS FRACTURE
Anesthesia: General | Site: Hand | Laterality: Left

## 2015-02-15 MED ORDER — MIDAZOLAM HCL 2 MG/2ML IJ SOLN
INTRAMUSCULAR | Status: AC
  Filled 2015-02-15: qty 2

## 2015-02-15 MED ORDER — PROMETHAZINE HCL 25 MG/ML IJ SOLN: 6.2500 mg | INTRAMUSCULAR | Status: DC | PRN

## 2015-02-15 MED ORDER — LACTATED RINGERS IV SOLN: INTRAVENOUS | Status: DC

## 2015-02-15 MED ORDER — GLYCOPYRROLATE 0.2 MG/ML IJ SOLN: 0.2000 mg | Freq: Once | INTRAMUSCULAR | Status: DC | PRN

## 2015-02-15 MED ORDER — MIDAZOLAM HCL 2 MG/2ML IJ SOLN
1.0000 mg | INTRAMUSCULAR | Status: DC | PRN
  Administered 2015-02-15: 1 mg via INTRAVENOUS

## 2015-02-15 MED ORDER — PROPOFOL 10 MG/ML IV BOLUS
INTRAVENOUS | Status: DC | PRN
  Administered 2015-02-15: 110 mg via INTRAVENOUS

## 2015-02-15 MED ORDER — KETOROLAC TROMETHAMINE 30 MG/ML IJ SOLN: 30.0000 mg | Freq: Once | INTRAMUSCULAR | Status: DC

## 2015-02-15 MED ORDER — CEFAZOLIN SODIUM-DEXTROSE 2-3 GM-% IV SOLR
INTRAVENOUS | Status: AC
  Filled 2015-02-15: qty 50

## 2015-02-15 MED ORDER — LACTATED RINGERS IV SOLN
INTRAVENOUS | Status: DC | PRN
  Administered 2015-02-15 (×2): via INTRAVENOUS

## 2015-02-15 MED ORDER — FENTANYL CITRATE (PF) 100 MCG/2ML IJ SOLN
50.0000 ug | INTRAMUSCULAR | Status: DC | PRN
  Administered 2015-02-15: 50 ug via INTRAVENOUS

## 2015-02-15 MED ORDER — FENTANYL CITRATE (PF) 100 MCG/2ML IJ SOLN
INTRAMUSCULAR | Status: AC
  Filled 2015-02-15: qty 6

## 2015-02-15 MED ORDER — DEXAMETHASONE SODIUM PHOSPHATE 4 MG/ML IJ SOLN
INTRAMUSCULAR | Status: DC | PRN
  Administered 2015-02-15: 10 mg via INTRAVENOUS

## 2015-02-15 MED ORDER — SCOPOLAMINE 1 MG/3DAYS TD PT72: 1.0000 | MEDICATED_PATCH | Freq: Once | TRANSDERMAL | Status: DC | PRN

## 2015-02-15 MED ORDER — LIDOCAINE HCL (CARDIAC) 20 MG/ML IV SOLN
INTRAVENOUS | Status: DC | PRN
  Administered 2015-02-15: 60 mg via INTRAVENOUS

## 2015-02-15 MED ORDER — FENTANYL CITRATE (PF) 100 MCG/2ML IJ SOLN
INTRAMUSCULAR | Status: AC
  Filled 2015-02-15: qty 2

## 2015-02-15 MED ORDER — HYDROMORPHONE HCL 1 MG/ML IJ SOLN: 0.2500 mg | INTRAMUSCULAR | Status: DC | PRN

## 2015-02-15 MED ORDER — PROPOFOL 10 MG/ML IV BOLUS
INTRAVENOUS | Status: AC
Start: 2015-02-15 — End: 2015-02-15
  Filled 2015-02-15: qty 20

## 2015-02-15 MED ORDER — CEFAZOLIN SODIUM-DEXTROSE 2-3 GM-% IV SOLR
2.0000 g | INTRAVENOUS | Status: AC
  Administered 2015-02-15: 2 g via INTRAVENOUS

## 2015-02-15 SURGICAL SUPPLY — 65 items
BANDAGE COBAN STERILE 2 (GAUZE/BANDAGES/DRESSINGS) IMPLANT
BIT DRILL SOLID 2.0X40MM (BIT) ×1 IMPLANT
BIT DRILL SOLID 2.5X40MM (BIT) ×1 IMPLANT
BLADE MINI RND TIP GREEN BEAV (BLADE) IMPLANT
BLADE SURG 15 STRL LF DISP TIS (BLADE) ×1 IMPLANT
BLADE SURG 15 STRL SS (BLADE) ×2
BNDG COHESIVE 4X5 TAN STRL (GAUZE/BANDAGES/DRESSINGS) ×3 IMPLANT
BNDG ESMARK 4X9 LF (GAUZE/BANDAGES/DRESSINGS) ×3 IMPLANT
BNDG GAUZE ELAST 4 BULKY (GAUZE/BANDAGES/DRESSINGS) ×6 IMPLANT
BRUSH SCRUB EZ PLAIN DRY (MISCELLANEOUS) ×3 IMPLANT
CANISTER SUCT 1200ML W/VALVE (MISCELLANEOUS) ×3 IMPLANT
CHLORAPREP W/TINT 26ML (MISCELLANEOUS) ×3 IMPLANT
CORDS BIPOLAR (ELECTRODE) ×3 IMPLANT
COVER BACK TABLE 60X90IN (DRAPES) ×3 IMPLANT
COVER MAYO STAND STRL (DRAPES) ×3 IMPLANT
CUFF TOURNIQUET SINGLE 18IN (TOURNIQUET CUFF) ×3 IMPLANT
CUFF TOURNIQUET SINGLE 24IN (TOURNIQUET CUFF) IMPLANT
DRAPE C-ARM 42X72 X-RAY (DRAPES) ×3 IMPLANT
DRAPE EXTREMITY T 121X128X90 (DRAPE) ×3 IMPLANT
DRAPE SURG 17X23 STRL (DRAPES) ×3 IMPLANT
DRILL SOLID 2.0X40MM (BIT) ×3
DRILL SOLID 2.5X40MM (BIT) ×3
DRSG ADAPTIC 3X8 NADH LF (GAUZE/BANDAGES/DRESSINGS) ×3 IMPLANT
DRSG EMULSION OIL 3X3 NADH (GAUZE/BANDAGES/DRESSINGS) ×3 IMPLANT
ELECT REM PT RETURN 9FT ADLT (ELECTROSURGICAL) ×3
ELECTRODE REM PT RTRN 9FT ADLT (ELECTROSURGICAL) ×1 IMPLANT
GAUZE SPONGE 4X4 12PLY STRL (GAUZE/BANDAGES/DRESSINGS) ×3 IMPLANT
GLOVE BIO SURGEON STRL SZ7.5 (GLOVE) ×3 IMPLANT
GLOVE BIOGEL PI IND STRL 7.0 (GLOVE) ×2 IMPLANT
GLOVE BIOGEL PI IND STRL 8 (GLOVE) ×1 IMPLANT
GLOVE BIOGEL PI INDICATOR 7.0 (GLOVE) ×4
GLOVE BIOGEL PI INDICATOR 8 (GLOVE) ×2
GLOVE ECLIPSE 6.5 STRL STRAW (GLOVE) ×6 IMPLANT
GOWN STRL REUS W/ TWL LRG LVL3 (GOWN DISPOSABLE) ×2 IMPLANT
GOWN STRL REUS W/TWL LRG LVL3 (GOWN DISPOSABLE) ×4
GOWN STRL REUS W/TWL XL LVL3 (GOWN DISPOSABLE) ×3 IMPLANT
NEEDLE HYPO 25X1 1.5 SAFETY (NEEDLE) IMPLANT
NS IRRIG 1000ML POUR BTL (IV SOLUTION) ×3 IMPLANT
PACK BASIN DAY SURGERY FS (CUSTOM PROCEDURE TRAY) ×3 IMPLANT
PADDING CAST ABS 4INX4YD NS (CAST SUPPLIES) ×2
PADDING CAST ABS COTTON 4X4 ST (CAST SUPPLIES) ×1 IMPLANT
PENCIL BUTTON HOLSTER BLD 10FT (ELECTRODE) ×3 IMPLANT
RUBBERBAND STERILE (MISCELLANEOUS) IMPLANT
SKELETAL DYNAMICS DVR SET (Set) ×3 IMPLANT
SLEEVE SCD COMPRESS KNEE MED (MISCELLANEOUS) ×3 IMPLANT
SLING ARM FOAM STRAP LRG (SOFTGOODS) IMPLANT
SLING ARM MED ADULT FOAM STRAP (SOFTGOODS) ×3 IMPLANT
SPLINT PLASTER CAST XFAST 3X15 (CAST SUPPLIES) ×4 IMPLANT
SPLINT PLASTER XTRA FASTSET 3X (CAST SUPPLIES) ×8
STOCKINETTE 6  STRL (DRAPES) ×2
STOCKINETTE 6 STRL (DRAPES) ×1 IMPLANT
SUCTION FRAZIER TIP 10 FR DISP (SUCTIONS) ×3 IMPLANT
SUT VIC AB 2-0 PS2 27 (SUTURE) ×3 IMPLANT
SUT VICRYL 4-0 PS2 18IN ABS (SUTURE) IMPLANT
SUT VICRYL RAPIDE 4-0 (SUTURE) IMPLANT
SUT VICRYL RAPIDE 4/0 PS 2 (SUTURE) ×3 IMPLANT
SYR BULB 3OZ (MISCELLANEOUS) ×3 IMPLANT
SYRINGE 10CC LL (SYRINGE) IMPLANT
TOWEL OR 17X24 6PK STRL BLUE (TOWEL DISPOSABLE) ×3 IMPLANT
TOWEL OR NON WOVEN STRL DISP B (DISPOSABLE) ×3 IMPLANT
TUBE CONNECTING 20'X1/4 (TUBING) ×1
TUBE CONNECTING 20X1/4 (TUBING) ×2 IMPLANT
UNDERPAD 30X30 (UNDERPADS AND DIAPERS) ×3 IMPLANT
WIRE FIX 1.5 STANDARD TIP (WIRE) ×3
WIRE FIX 1.5 STD TIP (WIRE) ×1 IMPLANT

## 2015-02-15 NOTE — Progress Notes (Signed)
Assisted Dr. Rose with left, ultrasound guided, supraclavicular block. Side rails up, monitors on throughout procedure. See vital signs in flow sheet. Tolerated Procedure well. 

## 2015-02-15 NOTE — Interval H&P Note (Signed)
History and Physical Interval Note:  02/15/2015 12:03 PM  Kathryn [name]  has presented today for surgery, with the diagnosis of LEFT DISTAL RADIUS FRACTURE  The various methods of treatment have been discussed with the patient and family. After consideration of risks, benefits and other options for treatment, the patient has consented to  Procedure(s): OPEN TREATMENT OF LEFT DISTAL RADIUS FRACTURE (Left) as a surgical intervention .  The patient's history has been reviewed, patient examined, no change in status, stable for surgery.  I have reviewed the patient's chart and labs.  Questions were answered to the patient's satisfaction.     Lulubelle Simcoe A.

## 2015-02-15 NOTE — Progress Notes (Signed)
Dr. Grandville Silos at bedside to examine swelling over knuckles left hand.  Ice applied and hand elevated.

## 2015-02-15 NOTE — Anesthesia Preprocedure Evaluation (Signed)
Anesthesia Evaluation  Patient identified by MRN, date of birth, ID band Patient awake    Reviewed: Allergy & Precautions, NPO status , Patient's Chart, lab work & pertinent test results  Airway Mallampati: II  TM Distance: >3 FB Neck ROM: Full    Dental no notable dental hx.    Pulmonary neg pulmonary ROS,  breath sounds clear to auscultation  Pulmonary exam normal       Cardiovascular hypertension, Pt. on medications and Pt. on home beta blockers Normal cardiovascular exam+ dysrhythmias Atrial Fibrillation + pacemaker Rhythm:Regular Rate:Normal     Neuro/Psych negative neurological ROS  negative psych ROS   GI/Hepatic negative GI ROS, Neg liver ROS,   Endo/Other  negative endocrine ROS  Renal/GU negative Renal ROS  negative genitourinary   Musculoskeletal negative musculoskeletal ROS (+)   Abdominal   Peds negative pediatric ROS (+)  Hematology negative hematology ROS (+)   Anesthesia Other Findings   Reproductive/Obstetrics negative OB ROS                             Anesthesia Physical Anesthesia Plan  ASA: III  Anesthesia Plan: General   Post-op Pain Management: GA combined w/ Regional for post-op pain   Induction: Intravenous  Airway Management Planned: LMA  Additional Equipment:   Intra-op Plan:   Post-operative Plan: Extubation in OR  Informed Consent: I have reviewed the patients History and Physical, chart, labs and discussed the procedure including the risks, benefits and alternatives for the proposed anesthesia with the patient or authorized representative who has indicated his/her understanding and acceptance.   Dental advisory given  Plan Discussed with: CRNA and Surgeon  Anesthesia Plan Comments:         Anesthesia Quick Evaluation

## 2015-02-15 NOTE — Discharge Instructions (Signed)
Discharge Instructions   You have a dressing with a plaster splint incorporated in it. Move your fingers as much as possible, making a full fist and fully opening the fist fully. Elevate your hand to reduce pain & swelling of the digits.  Ice over the operative site may be helpful to reduce pain & swelling.  DO NOT USE HEAT. Pain medicine has been prescribed for you.  Use your medicine as needed over the first 48 hours, and then you can begin to taper your use.  You may use Tylenol in place of your prescribed pain medication, but not IN ADDITION to it. Leave the dressing in place until you return to our office.  You may shower, but keep the bandage clean & dry.  You may drive a car when you are off of prescription pain medications and can safely control your vehicle with both hands. Our office will call you to arrange follow-up   Please call [phone] during normal business hours or [phone] after hours for any problems. Including the following:  - excessive redness of the incisions - drainage for more than 4 days - fever of more than 101.5 F  *Please note that pain medications will not be refilled after hours or on weekends.   Post Anesthesia Home Care Instructions  Activity: Get plenty of rest for the remainder of the day. A responsible adult should stay with you for 24 hours following the procedure.  For the next 24 hours, DO NOT: -Drive a car -Paediatric nurse -Drink alcoholic beverages -Take any medication unless instructed by your physician -Make any legal decisions or sign important papers.  Meals: Start with liquid foods such as gelatin or soup. Progress to regular foods as tolerated. Avoid greasy, spicy, heavy foods. If nausea and/or vomiting occur, drink only clear liquids until the nausea and/or vomiting subsides. Call your physician if vomiting continues.  Special Instructions/Symptoms: Your throat may feel dry or sore from the anesthesia or the breathing  tube placed in your throat during surgery. If this causes discomfort, gargle with warm salt water. The discomfort should disappear within 24 hours.  If you had a scopolamine patch placed behind your ear for the management of post- operative nausea and/or vomiting:  1. The medication in the patch is effective for 72 hours, after which it should be removed.  Wrap patch in a tissue and discard in the trash. Wash hands thoroughly with soap and water. 2. You may remove the patch earlier than 72 hours if you experience unpleasant side effects which may include dry mouth, dizziness or visual disturbances. 3. Avoid touching the patch. Wash your hands with soap and water after contact with the patch.     Regional Anesthesia Blocks  1. Numbness or the inability to move the "blocked" extremity may last from 3-48 hours after placement. The length of time depends on the medication injected and your individual response to the medication. If the numbness is not going away after 48 hours, call your surgeon.  2. The extremity that is blocked will need to be protected until the numbness is gone and the  Strength has returned. Because you cannot feel it, you will need to take extra care to avoid injury. Because it may be weak, you may have difficulty moving it or using it. You may not know what position it is in without looking at it while the block is in effect.  3. For blocks in the legs and feet, returning to weight bearing and walking  done carefully. You will need to wait until the numbness is entirely gone and the strength has returned. You should be able to move your leg and foot normally before you try and bear weight or walk. You will need someone to be with you when you first try to ensure you do not fall and possibly risk injury. ° °4. Bruising and tenderness at the needle site are common side effects and will resolve in a few days. ° °5. Persistent numbness or new problems with movement should be  communicated to the surgeon or the [facility] ([phone])/ [facility] ([phone]). ° °

## 2015-02-15 NOTE — H&P (View-Only) (Signed)
ORTHOPAEDIC CONSULTATION HISTORY & PHYSICAL REQUESTING PHYSICIAN: Everlene Balls, MD  Chief Complaint: left wrist fracture  HPI: Kathryn [name] is a 81 y.o. female who fell onto an outstretched left hand, sustaining an injury. She had a small break in the skin ulnarly. Otherwise she describes pain, swelling, deformity. Denies loss of consciousness. Husband is presently in rehabilitation.  Past Medical History  Diagnosis Date  . DVT (deep venous thrombosis) 2010  . Bradycardia   . Syncope     permanent pacemaker 2008  . Presence of permanent cardiac pacemaker     COMPLETE HEART BLOCK--DR. CROITORU   . Dysrhythmia     HX OF PAROXYSMAL ATRIAL FIB- TAKES ASPIRIN FOR BLOOD THINNER  . Hypertension   . GERD (gastroesophageal reflux disease)   . Arthritis     HANDS  . Colostomy in place     S/P COLON RESECTION AND COLOSTOMY 08/27/13 FOR COLON PERFORATION   Past Surgical History  Procedure Laterality Date  . Pacemaker insertion  12/02/1998    medtronic  . Cholecystectomy    . Abdominal hysterectomy    . Appendectomy    . Tonsillectomy    . Cataract extraction, bilateral    . Laparotomy N/A 08/27/2013    Procedure: LAPAROSCOPY CONVERTED TO OPEN LAPAROTOMY WITH SIGMOID COLECTOMY AND COLOSTOMY;  Surgeon: Shann Medal, MD;  Location: [facility];  Service: General;  Laterality: N/A;  . Permanent pacemaker generator change  02/28/2007    Medtronic Adapta  . Breast enhancement surgery    . US echocardiography  10/09/2011    mild LAE, mild MR,mild to mod TR  . Colonoscopy N/A 03/05/2014    Procedure: COLONOSCOPY;  Surgeon: Shann Medal, MD;  Location: [facility];  Service: General;  Laterality: N/A;  . Colostomy closure N/A 05/21/2014    Procedure: LAPAROSCOPIC ASSISTED CLOSURE OF COLOSTOMY AND REPAIR OF TRANSVERSE COLON AND ENTEROTOMY;  Surgeon: Alphonsa Overall, MD;  Location: [facility];  Service: General;  Laterality: N/A;   History   Social History  . Marital Status: Married    Spouse Name:  N/A  . Number of Children: N/A  . Years of Education: N/A   Social History Main Topics  . Smoking status: Never Smoker   . Smokeless tobacco: Never Used  . Alcohol Use: No  . Drug Use: No  . Sexual Activity: No   Other Topics Concern  . None   Social History Narrative   Family History  Problem Relation Age of Onset  . Heart disease Mother   . Heart disease Father    Allergies  Allergen Reactions  . Codeine     NOT SURE WHAT THE REACTION WAS  . Darvocet [Propoxyphene N-Acetaminophen] Other (See Comments)    Hallucination   . Epinephrine Other (See Comments)    Severe increase in heart rate  . Percocet [Oxycodone-Acetaminophen] Other (See Comments)    Hallucinations    Prior to Admission medications   Medication Sig Start Date End Date Taking? Authorizing Provider  aspirin EC 81 MG tablet Take 81 mg by mouth every morning.    Yes Historical Provider, MD  Calcium Carbonate-Vitamin D (CALTRATE 600+D PO) Take 1 tablet by mouth every morning.   Yes Historical Provider, MD  metoprolol tartrate (LOPRESSOR) 25 MG tablet Take 12.5 mg by mouth 2 (two) times daily.  08/11/13  Yes Historical Provider, MD  valsartan (DIOVAN) 80 MG tablet Take 0.5 tablets by mouth every morning.  07/25/13  Yes Historical Provider, MD  vitamin C (  ASCORBIC ACID) 500 MG tablet Take 500 mg by mouth every morning.   Yes Historical Provider, MD  acetaminophen (TYLENOL) 500 MG tablet Take 1 tablet (500 mg total) by mouth every 6 (six) hours as needed. 02/09/15   Milly Jakob, MD  cephALEXin (KEFLEX) 500 MG capsule Take 1 capsule (500 mg total) by mouth 3 (three) times daily. 02/09/15   Milly Jakob, MD   Dg Wrist Complete Left  02/09/2015   CLINICAL DATA:  Post reduction left wrist fracture.  EXAM: LEFT WRIST - COMPLETE 3+ VIEW  COMPARISON:  Earlier same day  FINDINGS: Overlying cast obscures evaluation of bony detail. There is evidence of patient's distal radial fracture post reduction with minimal residual  dorsal angulation of the distal fragment. Stable possible displaced ulnar styloid fracture which may be acute or chronic. Remainder the exam is unchanged.  IMPRESSION: Evidence of patient's distal radial fracture post reduction with minimal residual dorsal angulation of the distal fragment.   Electronically Signed   By: Marin Olp M.D.   On: 02/09/2015 20:10   Dg Wrist Complete Left  02/09/2015   CLINICAL DATA:  Patient status post fall at rehab center. Severe left breast pain. Initial encounter.  EXAM: LEFT WRIST - COMPLETE 3+ VIEW  COMPARISON:  Wrist radiograph 01/11/2004.  FINDINGS: There is a dorsally displaced comminuted fracture through distal radius with extension to the radiocarpal joint. Marked degenerative changes at the first [facility] joint. No definite evidence for associated acute fractures.  IMPRESSION: Dorsally displaced comminuted fracture through the distal radius.  No definite evidence for displaced scaphoid fracture. If the patient's tenderness is in the vicinity of the scaphoid/anatomic snuffbox, then cross-sectional imaging or presumptive treatment for occult scaphoid fracture might be considered.   Electronically Signed   By: Lovey Newcomer M.D.   On: 02/09/2015 18:21    Positive ROS: All other systems have been reviewed and were otherwise negative with the exception of those mentioned in the HPI and as above.  Physical Exam: Vitals: Refer to EMR. Constitutional:  WD, WN, NAD HEENT:  NCAT, EOMI Neuro/Psych:  Alert & oriented to person, place, and time; appropriate mood & affect Lymphatic: No generalized extremity edema or lymphadenopathy Extremities / MSK:  The extremities are normal with respect to appearance, ranges of motion, joint stability, muscle strength/tone, sensation, & perfusion except as otherwise noted:  Intact light touch sensibility in the radial, ulnar, median nerve distributions with intact motor to the same. Sugar tong splint is removed, ulnar wound inspected and  found to be superficial, about a centimeter transverse. There is no significant bleeding from the wound. There is no protruding subcutaneous tissues rather deeper structures. The distal radius is obviously shortened, slightly radial angulated and dorsally displaced.  Assessment: Comminuted intra-articular left distal radius fracture with ulnar sided noncommunicating laceration  Plan: I instilled a hematoma block with plain lidocaine and then provided a gentle manipulative reduction. There was a 3-4 cm transverse dorsal skin tear that occurred in the process. This was dressed with a sterile dressing and a sugar tong splint applied. Postreduction radiographs revealed grossly improved alignment in both sagittal and coronal planes. Some residual dorsal translation and dorsal tilt remains. She was instructed regarding elevation, digital motion exercises, and swelling of infection precautions. She'll be discharged at the discretion of emergency department, and our office will call her tomorrow following day to arrange follow-up for next week for a wound check and further discussions regarding definitive management with open treatment.  Incidentally, her tetanus was  updated today. Will place her on some prophylactic oral antibiotics given open wounds under the splint.  In addition, she has not well-tolerated any narcotics in the past with hallucinations and similar reactions, and so we will initially try to go with just Tylenol.  Rayvon Char [address] [address], [place], [place]  [address] Office: [phone] Mobile: [phone]

## 2015-02-15 NOTE — Transfer of Care (Signed)
Immediate Anesthesia Transfer of Care Note  Patient: Kathryn [name]  Procedure(s) Performed: Procedure(s): OPEN TREATMENT OF LEFT DISTAL RADIUS FRACTURE (Left)  Patient Location: PACU  Anesthesia Type:General  Level of Consciousness: awake  Airway & Oxygen Therapy: Patient Spontanous Breathing and Patient connected to face mask oxygen  Post-op Assessment: Report given to RN and Post -op Vital signs reviewed and stable  Post vital signs: Reviewed and stable  Last Vitals:  Filed Vitals:   02/15/15 1210  BP:   Pulse: 70  Temp:   Resp: 16    Complications: No apparent anesthesia complications

## 2015-02-15 NOTE — Op Note (Signed)
02/15/2015  12:03 PM  PATIENT:  Kathryn [name]  81 y.o. female  PRE-OPERATIVE DIAGNOSIS:  Displaced comminuted left distal radius fracture and dorsal skin wound  POST-OPERATIVE DIAGNOSIS:  Same  PROCEDURE:  ORIF left intra-articular comminuted distal radius fracture, 30076, and repair of dorsal skin wound  SURGEON: Rayvon Char. [place] Silos, MD  PHYSICIAN ASSISTANT: Morley Kos, OPA-C  ANESTHESIA:  regional and general  SPECIMENS:  None  DRAINS: None  EBL:  less than 50 mL  PREOPERATIVE INDICATIONS:  Kathryn [name] is a  81 y.o. female with comminuted displaced intra-articular left distal radius fracture that has undergone provisional growth reduction in the emergency department. In the process, the dorsal skin tear was created.  The risks benefits and alternatives were discussed with the patient preoperatively including but not limited to the risks of infection, bleeding, nerve injury, cardiopulmonary complications, the need for revision surgery, among others, and the patient verbalized understanding and consented to proceed.  OPERATIVE IMPLANTS: Skeletal dynamics narrow 3-hole plate with appropriate smooth pegs and screws.  OPERATIVE PROCEDURE: After receiving prophylactic antibiotics and a regional block, the patient was escorted to the operative theatre and placed in a supine position. General anesthesia was administered.  A surgical "time-out" was performed during which the planned procedure, proposed operative site, and the correct patient identity were compared to the operative consent and agreement confirmed by the circulating nurse according to current facility policy. Following application of a tourniquet to the operative extremity, the exposed skin was pre-scrub with Hibiclens scrub brush and then was prepped with Chloraprep and draped in the usual sterile fashion. The limb was exsanguinated with an Esmarch bandage and the tourniquet inflated to approximately 134mmHg higher than  systolic BP.   A sinusoidal-shaped incision was marked and made over the FCR axis and the distal forearm. The skin was incised sharply with scalpel, subcutaneous tissues with blunt and spreading dissection. The FCR axis was exploited deeply. The pronator quadratus was reflected in an L-shaped ulnarly and the brachioradialis was split in a Z-plasty fashion for later reapproximation. The fracture was inspected and provisionally reduced.  This was confirmed fluoroscopically. The appropriately sized plate was selected and found to fit well. It was placed in its provisional alignment of the radius and this was confirmed fluoroscopically.  It was secured to the radius with a screw through the slotted hole.  Additional adjustments were made as necessary, and the distal holes were all drilled and filled.  Peg/screw length distally was selected on the shorter side of measurements to minimize the risk for dorsal cortical penetration. The remainder of the proximal holes were drilled and filled.   Final images were obtained and the DRUJ was examined for stability. It was found to be sufficiently stable. The wound was then copiously irrigated and the brachioradialis repaired with 2-0 Vicryl Rapide suture followed by repair of the pronator quadratus with the same suture type. The pronator was quite flimsy and somewhat shredded, only allowing partial coverage of the plate with it. Tourniquet was released and additional hemostasis obtained and the skin was closed with running 4-0 Vicryl Rapide horizontal mattress suture in the skin.   Attention was directed to the dorsal wound, which was debrided through scraping debridement of the subcutaneous tissues, irrigated copiously and then closed loosely with 4-0 Vicryl Rapide horizontal mattress interrupted sutures .  A bulky dressing with a volar plaster component was applied and she was taken to room stable condition.  DISPOSITION: The patient will be discharged home today  with  typical post-op instructions, returning in 7-10 days for reevaluation with new x-rays of the affected wrist out of the splint to include an inclined lateral and then transition to therapy to have a custom splint constructed and begin rehabilitation.

## 2015-02-15 NOTE — Anesthesia Procedure Notes (Signed)
Procedure Name: LMA Insertion Date/Time: 02/15/2015 12:22 PM Performed by: Lyndee Leo Pre-anesthesia Checklist: Patient identified, Emergency Drugs available, Suction available and Patient being monitored Patient Re-evaluated:Patient Re-evaluated prior to inductionOxygen Delivery Method: Circle System Utilized Preoxygenation: Pre-oxygenation with 100% oxygen Intubation Type: IV induction Ventilation: Mask ventilation without difficulty LMA: LMA inserted LMA Size: 4.0 Number of attempts: 1 Airway Equipment and Method: Bite block Placement Confirmation: positive ETCO2 Tube secured with: Tape Dental Injury: Teeth and Oropharynx as per pre-operative assessment

## 2015-02-15 NOTE — Anesthesia Postprocedure Evaluation (Signed)
  Anesthesia Post-op Note  Patient: Kathryn [name]  Procedure(s) Performed: Procedure(s): OPEN TREATMENT OF LEFT DISTAL RADIUS FRACTURE (Left)  Patient Location: PACU  Anesthesia Type:General and block  Level of Consciousness: awake and alert   Airway and Oxygen Therapy: Patient Spontanous Breathing  Post-op Pain: Controlled  Post-op Assessment: Post-op Vital signs reviewed, Patient's Cardiovascular Status Stable and Respiratory Function Stable  Post-op Vital Signs: Reviewed  Filed Vitals:   02/15/15 1345  BP: 151/63  Pulse: 70  Temp:   Resp: 15    Complications: No apparent anesthesia complications

## 2015-02-16 ENCOUNTER — Encounter (HOSPITAL_BASED_OUTPATIENT_CLINIC_OR_DEPARTMENT_OTHER): Payer: Self-pay | Admitting: Orthopedic Surgery

## 2015-02-17 LAB — CUP PACEART REMOTE DEVICE CHECK
Battery Impedance: 3802 Ohm
Battery Voltage: 2.65 V
Brady Statistic AP VP Percent: 83 %
Brady Statistic AP VS Percent: 0 %
Brady Statistic AS VS Percent: 0 %
Date Time Interrogation Session: 20160804173603
Lead Channel Impedance Value: 419 Ohm
Lead Channel Impedance Value: 708 Ohm
Lead Channel Pacing Threshold Amplitude: 0.5 V
Lead Channel Pacing Threshold Pulse Width: 0.4 ms
Lead Channel Setting Pacing Amplitude: 2 V
Lead Channel Setting Pacing Amplitude: 2.5 V
Lead Channel Setting Pacing Pulse Width: 0.46 ms
MDC IDC MSMT BATTERY REMAINING LONGEVITY: 8 mo
MDC IDC MSMT LEADCHNL RA PACING THRESHOLD PULSEWIDTH: 0.4 ms
MDC IDC MSMT LEADCHNL RV PACING THRESHOLD AMPLITUDE: 0.625 V
MDC IDC SET LEADCHNL RV SENSING SENSITIVITY: 4 mV
MDC IDC STAT BRADY AS VP PERCENT: 17 %

## 2015-03-02 ENCOUNTER — Encounter: Payer: Self-pay | Admitting: Cardiology

## 2015-04-18 ENCOUNTER — Encounter: Payer: Self-pay | Admitting: Cardiovascular Disease

## 2015-04-18 ENCOUNTER — Ambulatory Visit (INDEPENDENT_AMBULATORY_CARE_PROVIDER_SITE_OTHER): Payer: Medicare Other | Admitting: *Deleted

## 2015-04-18 DIAGNOSIS — Z950 Presence of cardiac pacemaker: Secondary | ICD-10-CM

## 2015-04-19 NOTE — Progress Notes (Signed)
Remote pacemaker transmission.   

## 2015-04-21 LAB — CUP PACEART REMOTE DEVICE CHECK
Battery Voltage: 2.63 V
Brady Statistic AP VS Percent: 0 %
Brady Statistic AS VP Percent: 19 %
Brady Statistic AS VS Percent: 0 %
Implantable Lead Implant Date: 20000522
Implantable Lead Location: 753860
Lead Channel Impedance Value: 421 Ohm
Lead Channel Setting Pacing Amplitude: 2.5 V
Lead Channel Setting Pacing Pulse Width: 0.4 ms
Lead Channel Setting Sensing Sensitivity: 4 mV
MDC IDC LEAD IMPLANT DT: 20000526
MDC IDC LEAD LOCATION: 753859
MDC IDC MSMT BATTERY IMPEDANCE: 4327 Ohm
MDC IDC MSMT BATTERY REMAINING LONGEVITY: 6 mo
MDC IDC MSMT LEADCHNL RV IMPEDANCE VALUE: 714 Ohm
MDC IDC SESS DTM: 20161010125117
MDC IDC SET LEADCHNL RA PACING AMPLITUDE: 2 V
MDC IDC STAT BRADY AP VP PERCENT: 81 %

## 2015-04-28 ENCOUNTER — Encounter: Payer: Self-pay | Admitting: Cardiology

## 2015-04-29 ENCOUNTER — Ambulatory Visit (INDEPENDENT_AMBULATORY_CARE_PROVIDER_SITE_OTHER): Payer: Medicare Other | Admitting: Family Medicine

## 2015-04-29 ENCOUNTER — Ambulatory Visit (INDEPENDENT_AMBULATORY_CARE_PROVIDER_SITE_OTHER): Payer: Medicare Other

## 2015-04-29 VITALS — BP 118/80 | HR 83 | Temp 97.9°F | Resp 16 | Ht 64.0 in | Wt 115.0 lb

## 2015-04-29 DIAGNOSIS — S93401A Sprain of unspecified ligament of right ankle, initial encounter: Secondary | ICD-10-CM

## 2015-04-29 DIAGNOSIS — M79671 Pain in right foot: Secondary | ICD-10-CM | POA: Diagnosis not present

## 2015-04-29 NOTE — Progress Notes (Signed)
[facility] [address] [place] [address] [phone]  Date:  04/29/2015   Name:  Kathryn [name]   DOB:  01/20/1928   MRN:  [id number]  PCP:  Jerlyn Ly, MD    Chief Complaint: Ankle Pain   History of Present Illness:  Kathryn [name] is a 81 y.o. very pleasant female patient who presents with the following:  Here today with concern about her right ankle- -she inverted the ankle this am while taking off a boot.  She did not fall, but had to put her foot down quickly to avoid falling and this is when she injured herself.   She notes pain when she puts pressure on her foot She did not OW get hurt She does not note any swelling or bruising of the foot or ankle, but has tenderness in the lateral foot and ankle Normal ankle ROM She does have a walker at home that she can use if needed   Patient Active Problem List   Diagnosis Date Noted  . S/P colostomy takedown 05/21/2014  . Diverticulitis of colon (without mention of hemorrhage) 01/27/2014  . Colostomy in place [facility]) 01/27/2014  . Paroxysmal atrial fibrillation ([place]) 01/19/2014  . Complete heart block ([place]) 01/19/2014  . Pacemaker - dual-chamber Medtronic 01/19/2014  . Preop cardiovascular exam 01/19/2014  . HTN (hypertension) 01/19/2014    Past Medical History  Diagnosis Date  . DVT (deep venous thrombosis) ([place]) 2010  . Bradycardia   . Syncope     permanent pacemaker 2008  . Presence of permanent cardiac pacemaker     COMPLETE HEART BLOCK--DR. CROITORU   . Hypertension   . GERD (gastroesophageal reflux disease)   . Arthritis     HANDS  . Colostomy in place [facility])     S/P COLON RESECTION AND COLOSTOMY 08/27/13 FOR COLON PERFORATION  . Dysrhythmia     HX OF PAROXYSMAL ATRIAL FIB- TAKES ASPIRIN FOR BLOOD THINNER  . Peripheral vascular disease ([place])     Blood clot behind Right knee    Past Surgical History  Procedure Laterality Date  . Pacemaker insertion  12/02/1998    medtronic  .  Cholecystectomy    . Abdominal hysterectomy    . Appendectomy    . Tonsillectomy    . Cataract extraction, bilateral    . Laparotomy N/A 08/27/2013    Procedure: LAPAROSCOPY CONVERTED TO OPEN LAPAROTOMY WITH SIGMOID COLECTOMY AND COLOSTOMY;  Surgeon: Shann Medal, MD;  Location: [facility];  Service: General;  Laterality: N/A;  . Permanent pacemaker generator change  02/28/2007    Medtronic Adapta  . Breast enhancement surgery    . US echocardiography  10/09/2011    mild LAE, mild MR,mild to mod TR  . Colonoscopy N/A 03/05/2014    Procedure: COLONOSCOPY;  Surgeon: Shann Medal, MD;  Location: [facility];  Service: General;  Laterality: N/A;  . Colostomy closure N/A 05/21/2014    Procedure: LAPAROSCOPIC ASSISTED CLOSURE OF COLOSTOMY AND REPAIR OF TRANSVERSE COLON AND ENTEROTOMY;  Surgeon: Alphonsa Overall, MD;  Location: [facility];  Service: General;  Laterality: N/A;  . Open reduction internal fixation (orif) distal radial fracture Left 02/15/2015    Procedure: OPEN TREATMENT OF LEFT DISTAL RADIUS FRACTURE;  Surgeon: Milly Jakob, MD;  Location: [facility];  Service: Orthopedics;  Laterality: Left;    Social History  Substance Use Topics  . Smoking status: Never Smoker   . Smokeless tobacco: Never Used  .  Alcohol Use: No    Family History  Problem Relation Age of Onset  . Heart disease Mother   . Heart disease Father     Allergies  Allergen Reactions  . Codeine     NOT SURE WHAT THE REACTION WAS  . Darvocet [Propoxyphene N-Acetaminophen] Other (See Comments)    Hallucination   . Epinephrine Other (See Comments)    Severe increase in heart rate  . Percocet [Oxycodone-Acetaminophen] Other (See Comments)    Hallucinations     Medication list has been reviewed and updated.  Current Outpatient Prescriptions on File Prior to Visit  Medication Sig Dispense Refill  . Calcium Carbonate-Vitamin D (CALTRATE 600+D PO) Take 1 tablet by mouth every morning.    . metoprolol  tartrate (LOPRESSOR) 25 MG tablet Take 12.5 mg by mouth 2 (two) times daily.     . valsartan (DIOVAN) 80 MG tablet Take 0.5 tablets by mouth every morning.     . vitamin C (ASCORBIC ACID) 500 MG tablet Take 500 mg by mouth every morning.    Marland Kitchen acetaminophen (TYLENOL) 500 MG tablet Take 1 tablet (500 mg total) by mouth every 6 (six) hours as needed. (Patient not taking: Reported on 04/29/2015) 50 tablet 0  . aspirin EC 81 MG tablet Take 81 mg by mouth every morning.      No current facility-administered medications on file prior to visit.    Review of Systems:  As per HPI- otherwise negative.   Physical Examination: Filed Vitals:   04/29/15 1804  BP: 118/80  Pulse: 83  Temp: 97.9 F (36.6 C)  Resp: 16   Filed Vitals:   04/29/15 1804  Height: 5\' 4"  (1.626 m)  Weight: 115 lb (52.164 kg)   Body mass index is 19.73 kg/(m^2). Ideal Body Weight: Weight in (lb) to have BMI = 25: 145.3  GEN: WDWN, NAD, Non-toxic, A & O x 3, older lady who is somewhat frail in appearance HEENT: Atraumatic, Normocephalic. Neck supple. No masses, No LAD. Ears and Nose: No external deformity. CV: RRR, No M/G/R. No JVD. No thrill. No extra heart sounds. PULM: CTA B, no wheezes, crackles, rhonchi. No retractions. No resp. distress. No accessory muscle use. EXTR: No c/c/e NEURO favoring right slightly PSYCH: Normally interactive. Conversant. Not depressed or anxious appearing.  Calm demeanor.  Right foot and ankle: mild tenderness at the lateral foot and ankle.  Minimal tenderness over the dorsum of the foot over the talus No significant swelling, no bruise NV intact  Ankle ROM wnl, achilles intact  [facility] reading (PRIMARY) by  Dr. Lorelei Pont Right foot: possible tiny avulsion on talus Right ankle: negative  Right foot pain. Initial encounter.  EXAM: RIGHT FOOT COMPLETE - 3+ VIEW  COMPARISON: None.  FINDINGS: Focal deficiency of the head of the second toe proximal phalanx may be postsurgical.  There are also likely postsurgical changes to the first metatarsal. There is a 4 mm osseous fragment along the dorsal anterior talus most compatible with a small avulsion fracture. There is mild overlying soft tissue swelling. There is no evidence of dislocation. Small posterior and plantar calcaneal enthesophytes are noted. Mild degenerative changes are noted at the first MTP joint.  IMPRESSION: Small avulsion fracture of the anterior talus.  RIGHT ANKLE - COMPLETE 3+ VIEW  COMPARISON: None.  FINDINGS: Ankle mortise is normal. No evidence of subluxation/dislocation. Tiny linear fragment along the dorsal aspect of the distal talus which may be chronic versus small acute chip fracture. Small inferior calcaneal spur.  IMPRESSION: Subtle linear fragment along the dorsal aspect of the distal talus which may be evidence of old injury versus acute chip fracture.  Assessment and Plan: Right ankle sprain, initial encounter - Plan: DG Foot Complete Right, DG Ankle Complete Right  Foot pain, right - Plan: DG Foot Complete Right, DG Ankle Complete Right  Here today with a right ankle sprain.  Questionable tiny avulsion on x-ray.  Counseled pt that I will receive the OR report soon from radiology.  We tried a short CAM but she found it was difficult to walk in this.  She is able to use an aircast.  Discussed with her in detail- given her overall situation although I would prefer to immobilize her foot I feel a CAM boot may be dangerous and could lead to a fall and more serious injury.  She agrees and is comfortable with using her walker and aircast prn.  She prefers tylenol OTC or pain  Called pt on 10/22- received radiology report as above.  She is doing ok at home with aircast.  Plan to continue this and I will call and check on her in a few days.  If pain is not decreasing consider other options   Signed Lamar Blinks, MD

## 2015-04-29 NOTE — Patient Instructions (Signed)
I will call you with your x-ray report asap Use the aircast brace as needed for support If you decide you do need the larger boot let me know However I think that you have a sprain, and the aircast will likely be all that you need

## 2015-05-06 ENCOUNTER — Telehealth: Payer: Self-pay | Admitting: Family Medicine

## 2015-05-06 NOTE — Telephone Encounter (Signed)
Called and checked on her.  Her ankle feels a lot better- she is able to walk ok without pain.  Advised her to come in for a recheck if her pain returns

## 2015-05-16 DIAGNOSIS — M79673 Pain in unspecified foot: Secondary | ICD-10-CM

## 2015-05-19 ENCOUNTER — Ambulatory Visit (INDEPENDENT_AMBULATORY_CARE_PROVIDER_SITE_OTHER): Payer: Medicare Other | Admitting: *Deleted

## 2015-05-19 DIAGNOSIS — I442 Atrioventricular block, complete: Secondary | ICD-10-CM

## 2015-05-19 NOTE — Progress Notes (Signed)
Remote pacemaker transmission.   

## 2015-05-20 ENCOUNTER — Encounter: Payer: Self-pay | Admitting: Cardiology

## 2015-05-20 LAB — CUP PACEART REMOTE DEVICE CHECK
Battery Remaining Longevity: 5 mo
Brady Statistic AP VS Percent: 0 %
Brady Statistic AS VP Percent: 20 %
Brady Statistic AS VS Percent: 0 %
Date Time Interrogation Session: 20161110173747
Implantable Lead Implant Date: 20000522
Implantable Lead Location: 753859
Implantable Lead Location: 753860
Lead Channel Pacing Threshold Amplitude: 0.5 V
Lead Channel Pacing Threshold Amplitude: 0.75 V
Lead Channel Pacing Threshold Pulse Width: 0.4 ms
Lead Channel Pacing Threshold Pulse Width: 0.4 ms
Lead Channel Setting Pacing Amplitude: 2.5 V
Lead Channel Setting Sensing Sensitivity: 4 mV
MDC IDC LEAD IMPLANT DT: 20000526
MDC IDC MSMT BATTERY IMPEDANCE: 4603 Ohm
MDC IDC MSMT BATTERY VOLTAGE: 2.63 V
MDC IDC MSMT LEADCHNL RA IMPEDANCE VALUE: 434 Ohm
MDC IDC MSMT LEADCHNL RV IMPEDANCE VALUE: 750 Ohm
MDC IDC SET LEADCHNL RA PACING AMPLITUDE: 2 V
MDC IDC SET LEADCHNL RV PACING PULSEWIDTH: 0.4 ms
MDC IDC STAT BRADY AP VP PERCENT: 80 %

## 2015-06-22 DIAGNOSIS — L600 Ingrowing nail: Secondary | ICD-10-CM

## 2015-07-28 ENCOUNTER — Ambulatory Visit: Payer: Self-pay | Admitting: Podiatry

## 2015-08-15 DIAGNOSIS — L84 Corns and callosities: Secondary | ICD-10-CM

## 2015-09-13 ENCOUNTER — Other Ambulatory Visit: Payer: Self-pay | Admitting: *Deleted

## 2015-09-13 ENCOUNTER — Telehealth: Payer: Self-pay | Admitting: Cardiovascular Disease

## 2015-09-13 ENCOUNTER — Encounter: Payer: Self-pay | Admitting: Cardiovascular Disease

## 2015-09-13 ENCOUNTER — Ambulatory Visit (INDEPENDENT_AMBULATORY_CARE_PROVIDER_SITE_OTHER): Payer: Medicare Other | Admitting: Cardiovascular Disease

## 2015-09-13 VITALS — BP 146/76 | HR 62 | Ht 61.75 in | Wt 118.6 lb

## 2015-09-13 DIAGNOSIS — Z950 Presence of cardiac pacemaker: Secondary | ICD-10-CM

## 2015-09-13 DIAGNOSIS — Z79899 Other long term (current) drug therapy: Secondary | ICD-10-CM | POA: Diagnosis not present

## 2015-09-13 DIAGNOSIS — I10 Essential (primary) hypertension: Secondary | ICD-10-CM

## 2015-09-13 DIAGNOSIS — Z45010 Encounter for checking and testing of cardiac pacemaker pulse generator [battery]: Secondary | ICD-10-CM

## 2015-09-13 DIAGNOSIS — I480 Paroxysmal atrial fibrillation: Secondary | ICD-10-CM | POA: Diagnosis not present

## 2015-09-13 DIAGNOSIS — I442 Atrioventricular block, complete: Secondary | ICD-10-CM

## 2015-09-13 DIAGNOSIS — D689 Coagulation defect, unspecified: Secondary | ICD-10-CM

## 2015-09-13 NOTE — Telephone Encounter (Signed)
Pacemaker battery change out is scheduled for 09/27/15.  Santiago Glad with [employer] notified.

## 2015-09-13 NOTE — Patient Instructions (Signed)
Your physician has recommended that you have a [place] Tuesday 09/27/15 - [employer]. A pacemaker is a small device that is placed under the skin of your chest or abdomen to help control abnormal heart rhythms. This device uses electrical pulses to prompt the heart to beat at a normal rate. Pacemakers are used to treat heart rhythms that are too slow. Wire (leads) are attached to the pacemaker that goes into the chambers of you heart. This is done in the hospital and usually requires and overnight stay. Please see the instruction sheet given to you today for more information.  Your physician recommends that you return for lab work in: Friday 09/23/15 AT [facility].

## 2015-09-13 NOTE — Telephone Encounter (Signed)
New Message  Santiago Glad from [employer] wanted to inform RN- pt's device needs replacement and has needed since JAn/2017. Please advise.

## 2015-09-14 DIAGNOSIS — Z45010 Encounter for checking and testing of cardiac pacemaker pulse generator [battery]: Secondary | ICD-10-CM | POA: Insufficient documentation

## 2015-09-14 NOTE — Progress Notes (Signed)
Patient ID: Kathryn [name], female   DOB: 04-Nov-1927, 82 y.o.   MRN: [id number]    Cardiology Office Note    Date:  09/14/2015   ID:  Kathryn [name], DOB Oct 04, 1927, MRN [id number]  PCP:  Jerlyn Ly, MD  Cardiologist:   Sanda Klein, MD   Chief Complaint  Patient presents with  . Annual Exam    no chest pain, occassional shortness of breath, no edema, has occassional cramping in legs, no lightheadedness or dizziness,occassional fatigue    History of Present Illness:  Kathryn [name] is a 82 y.o. female with complete heart block and history of paroxysmal atrial fibrillation returning for evaluation after her pacemaker reached elective replacement indicator on 07/24/2015. She complains of some fatigue, possibly related to the fact the device is now operating VVI 65 bpm without rate response. Interestingly, her atrial rate is pretty close to this, so her EKG mimics a sensed V paced rhythm, although on closer analysis the ventricular pacing is clearly a synchronous.  Her initial device was implanted in 2000 and the original leads are still in use. Her current generator is a Medtronic Adapta implanted in 2008. She is pacemaker dependent. Unfortunately lead analysis cannot be performed on this device at [facility]  Despite high embolic risk (CHADSVasc 4: age 13, gender, HTN) he has declined anticoagulation therapy in the past. She has never had an embolic event and does not have serious bleeding problems, although she does describe easy bruising even when taking only aspirin.    Past Medical History  Diagnosis Date  . DVT (deep venous thrombosis) ([place]) 2010  . Bradycardia   . Syncope     permanent pacemaker 2008  . Presence of permanent cardiac pacemaker     COMPLETE HEART BLOCK--DR. Marcanthony Sleight   . Hypertension   . GERD (gastroesophageal reflux disease)   . Arthritis     HANDS  . Colostomy in place [facility])     S/P COLON RESECTION AND COLOSTOMY 08/27/13 FOR COLON PERFORATION  . Dysrhythmia    HX OF PAROXYSMAL ATRIAL FIB- TAKES ASPIRIN FOR BLOOD THINNER  . Peripheral vascular disease ([place])     Blood clot behind Right knee    Past Surgical History  Procedure Laterality Date  . Pacemaker insertion  12/02/1998    medtronic  . Cholecystectomy    . Abdominal hysterectomy    . Appendectomy    . Tonsillectomy    . Cataract extraction, bilateral    . Laparotomy N/A 08/27/2013    Procedure: LAPAROSCOPY CONVERTED TO OPEN LAPAROTOMY WITH SIGMOID COLECTOMY AND COLOSTOMY;  Surgeon: Shann Medal, MD;  [facility];  Service: General;  Laterality: N/A;  . Permanent pacemaker generator change  02/28/2007    Medtronic Adapta  . Breast enhancement surgery    . US echocardiography  10/09/2011    mild LAE, mild MR,mild to mod TR  . Colonoscopy N/A 03/05/2014    Procedure: COLONOSCOPY;  Surgeon: Shann Medal, MD;  Location: [facility];  Service: General;  Laterality: N/A;  . Colostomy closure N/A 05/21/2014    Procedure: LAPAROSCOPIC ASSISTED CLOSURE OF COLOSTOMY AND REPAIR OF TRANSVERSE COLON AND ENTEROTOMY;  Surgeon: Alphonsa Overall, MD;  [facility];  Service: General;  Laterality: N/A;  . Open reduction internal fixation (orif) distal radial fracture Left 02/15/2015    Procedure: OPEN TREATMENT OF LEFT DISTAL RADIUS FRACTURE;  Surgeon: Milly Jakob, MD;  Location: [facility];  Service: Orthopedics;  Laterality: Left;    Outpatient  Prescriptions Prior to Visit  Medication Sig Dispense Refill  . acetaminophen (TYLENOL) 500 MG tablet Take 1 tablet (500 mg total) by mouth every 6 (six) hours as needed. 50 tablet 0  . aspirin EC 81 MG tablet Take 81 mg by mouth every morning.     . Calcium Carbonate-Vitamin D (CALTRATE 600+D PO) Take 1 tablet by mouth every morning.    . metoprolol tartrate (LOPRESSOR) 25 MG tablet Take 12.5 mg by mouth 2 (two) times daily.     . valsartan (DIOVAN) 80 MG tablet Take 0.5 tablets by mouth every morning.     . vitamin C (ASCORBIC ACID) 500  MG tablet Take 500 mg by mouth every morning.     No facility-administered medications prior to visit.     Allergies:   Codeine; Darvocet; Epinephrine; and Percocet   Social History   Social History  . Marital Status: Married    Spouse Name: N/A  . Number of Children: N/A  . Years of Education: N/A   Social History Main Topics  . Smoking status: Never Smoker   . Smokeless tobacco: Never Used  . Alcohol Use: No  . Drug Use: No  . Sexual Activity: No   Other Topics Concern  . None   Social History Narrative     Family History:  The patient's family history includes Heart disease in her father and mother.   ROS:   Please see the history of present illness.    ROS All other systems reviewed and are negative.   PHYSICAL EXAM:   VS:  BP 146/76 mmHg  Pulse 62  Ht 5' 1.75" (1.568 m)  Wt 53.78 kg (118 lb 9 oz)  BMI 21.87 kg/m2   GEN: Well nourished, well developed, in no acute distress HEENT: normal Neck: no JVD, carotid bruits, or masses Cardiac: Paradoxically split second heart sound ,RRR; no murmurs, rubs, or gallops,no edema ; her right subclavian pacemaker site appears healthy although there is some degree of skin atrophy at the scar Respiratory:  clear to auscultation bilaterally, normal work of breathing GI: soft, nontender, nondistended, + BS MS: no deformity or atrophy Skin: warm and dry, no rash Neuro:  Alert and Oriented x 3, Strength and sensation are intact Psych: euthymic mood, full affect  Wt Readings from Last 3 Encounters:  09/13/15 53.78 kg (118 lb 9 oz)  04/29/15 52.164 kg (115 lb)  02/15/15 51.256 kg (113 lb)      Studies/Labs Reviewed:   EKG:  EKG is ordered today.  The ekg ordered today demonstrates sinus rhythm with complete heart block and asynchronous ventricular pacing  Recent Labs: 02/14/2015: BUN 11; Creatinine, Ser 0.84; Potassium 4.3; Sodium 138 02/15/2015: Hemoglobin 10.4*   Lipid Panel No results found for: CHOL, TRIG, HDL,  CHOLHDL, VLDL, LDLCALC, LDLDIRECT   ASSESSMENT:    1. Pacemaker battery depletion   2. Pacemaker - dual-chamber Medtronic   3. Complete heart block ([place])   4. PAF (paroxysmal atrial fibrillation) ([place])   5. Essential hypertension   6. Medication management      PLAN:  In order of problems listed above:  1. PM at ERI: Need to schedule for generator change out. Discussed the potential risks and complications. This will be the third surgery on the site and it may be wise to implant a Tyrex antibiotic impregnated pouch. 2. PM: Pacemaker at elective replacement interval since January 15. Need to schedule to change out no later than April 15 3. CHB: Pacemaker dependent  due to complete heart block 4. AFib: I again encouraged her to consider anticoagulation for stroke prevention. We reviewed the relative benefit and bleeding risk for no therapy versus aspirin versus direct oral anticoagulant. She will consider these options. Would also consider watchman device if she declines anticoagulation. 5. HTN: Slightly elevated blood pressure today, but no changes were made in medications. Review blood pressure at the time of her pacemaker change out before we adjust medications.     Medication Adjustments/Labs and Tests Ordered: Current medicines are reviewed at length with the patient today.  Concerns regarding medicines are outlined above.  Medication changes, Labs and Tests ordered today are listed in the Patient Instructions below. Patient Instructions  Your physician has recommended that you have a PACEMAKER BATTERY REPLACEMENT Tuesday 09/27/15 - [employer]. A pacemaker is a small device that is placed under the skin of your chest or abdomen to help control abnormal heart rhythms. This device uses electrical pulses to prompt the heart to beat at a normal rate. Pacemakers are used to treat heart rhythms that are too slow. Wire (leads) are attached to the pacemaker that goes into the chambers of you  heart. This is done in the hospital and usually requires and overnight stay. Please see the instruction sheet given to you today for more information.  Your physician recommends that you return for lab work in: Friday 09/23/15 AT [facility].         Mikael Spray, MD  09/14/2015 11:15 AM    [facility] [place], [place], [place]  [address] Phone: [phone]; Fax: [phone]

## 2015-09-20 DIAGNOSIS — R52 Pain, unspecified: Secondary | ICD-10-CM

## 2015-09-27 LAB — CBC
HCT: 34 % — ABNORMAL LOW (ref 36.0–46.0)
Hemoglobin: 11.2 g/dL — ABNORMAL LOW (ref 12.0–15.0)
MCH: 30.1 pg (ref 26.0–34.0)
MCHC: 32.9 g/dL (ref 30.0–36.0)
MCV: 91.4 fL (ref 78.0–100.0)
MPV: 10.5 fL (ref 8.6–12.4)
PLATELETS: 252 10*3/uL (ref 150–400)
RBC: 3.72 MIL/uL — AB (ref 3.87–5.11)
RDW: 13.8 % (ref 11.5–15.5)
WBC: 6.6 10*3/uL (ref 4.0–10.5)

## 2015-09-28 LAB — COMPREHENSIVE METABOLIC PANEL
ALK PHOS: 63 U/L (ref 33–130)
ALT: 14 U/L (ref 6–29)
AST: 21 U/L (ref 10–35)
Albumin: 4.1 g/dL (ref 3.6–5.1)
BILIRUBIN TOTAL: 0.8 mg/dL (ref 0.2–1.2)
BUN: 20 mg/dL (ref 7–25)
CO2: 28 mmol/L (ref 20–31)
CREATININE: 1 mg/dL — AB (ref 0.60–0.88)
Calcium: 9.7 mg/dL (ref 8.6–10.4)
Chloride: 102 mmol/L (ref 98–110)
GLUCOSE: 132 mg/dL — AB (ref 65–99)
POTASSIUM: 4.4 mmol/L (ref 3.5–5.3)
SODIUM: 137 mmol/L (ref 135–146)
TOTAL PROTEIN: 6.5 g/dL (ref 6.1–8.1)

## 2015-09-28 LAB — PROTIME-INR
INR: 1.01 (ref <1.50)
PROTHROMBIN TIME: 13.4 s (ref 11.6–15.2)

## 2015-09-28 LAB — APTT: aPTT: 28 seconds (ref 24–37)

## 2015-09-28 NOTE — Telephone Encounter (Signed)
Returned call, goes to full VM - cannot leave message. Able to leave callback number alert.

## 2015-09-28 NOTE — Telephone Encounter (Signed)
She should continue taking the aspirin. It does not need to be stopped.

## 2015-09-28 NOTE — Telephone Encounter (Signed)
Pt called in stating that she will be having a battery replacement on 3/28 and she wanted to know when she should stop taking her 81 mg Asprin .

## 2015-09-29 NOTE — Telephone Encounter (Signed)
Patient aware to continue ASPIRIN 81 MG even on the day of procedure Verbalized understanding

## 2015-09-30 LAB — CUP PACEART INCLINIC DEVICE CHECK
Battery Impedance: 6440 Ohm
Brady Statistic RV Percent Paced: 100 %
Date Time Interrogation Session: 20170307165651
Implantable Lead Implant Date: 20000522
Implantable Lead Location: 753859
Implantable Lead Location: 753860
Lead Channel Setting Pacing Amplitude: 2.5 V
Lead Channel Setting Pacing Pulse Width: 0.46 ms
MDC IDC LEAD IMPLANT DT: 20000526
MDC IDC MSMT BATTERY REMAINING LONGEVITY: 4 mo
MDC IDC MSMT BATTERY VOLTAGE: 2.61 V
MDC IDC MSMT LEADCHNL RA IMPEDANCE VALUE: 67 Ohm
MDC IDC MSMT LEADCHNL RV IMPEDANCE VALUE: 706 Ohm
MDC IDC SET LEADCHNL RV SENSING SENSITIVITY: 4 mV

## 2015-10-04 ENCOUNTER — Ambulatory Visit (HOSPITAL_COMMUNITY)
Admission: RE | Admit: 2015-10-04 | Discharge: 2015-10-04 | Disposition: A | Discharge status: Home / Self Care | Payer: Medicare Other | Source: Ambulatory Visit | Attending: Cardiovascular Disease | Admitting: Cardiovascular Disease

## 2015-10-04 ENCOUNTER — Encounter (HOSPITAL_COMMUNITY)
Admission: RE | Disposition: A | Discharge status: Home / Self Care | Payer: Self-pay | Source: Ambulatory Visit | Attending: Cardiovascular Disease

## 2015-10-04 DIAGNOSIS — I442 Atrioventricular block, complete: Secondary | ICD-10-CM | POA: Diagnosis not present

## 2015-10-04 DIAGNOSIS — I739 Peripheral vascular disease, unspecified: Secondary | ICD-10-CM | POA: Insufficient documentation

## 2015-10-04 DIAGNOSIS — Z8249 Family history of ischemic heart disease and other diseases of the circulatory system: Secondary | ICD-10-CM | POA: Diagnosis not present

## 2015-10-04 DIAGNOSIS — M1990 Unspecified osteoarthritis, unspecified site: Secondary | ICD-10-CM | POA: Insufficient documentation

## 2015-10-04 DIAGNOSIS — Z45010 Encounter for checking and testing of cardiac pacemaker pulse generator [battery]: Secondary | ICD-10-CM

## 2015-10-04 DIAGNOSIS — Z86718 Personal history of other venous thrombosis and embolism: Secondary | ICD-10-CM | POA: Insufficient documentation

## 2015-10-04 DIAGNOSIS — Z885 Allergy status to narcotic agent status: Secondary | ICD-10-CM | POA: Diagnosis not present

## 2015-10-04 DIAGNOSIS — I10 Essential (primary) hypertension: Secondary | ICD-10-CM | POA: Diagnosis not present

## 2015-10-04 DIAGNOSIS — Z933 Colostomy status: Secondary | ICD-10-CM | POA: Insufficient documentation

## 2015-10-04 DIAGNOSIS — Z7982 Long term (current) use of aspirin: Secondary | ICD-10-CM | POA: Insufficient documentation

## 2015-10-04 DIAGNOSIS — I480 Paroxysmal atrial fibrillation: Secondary | ICD-10-CM | POA: Diagnosis not present

## 2015-10-04 DIAGNOSIS — K219 Gastro-esophageal reflux disease without esophagitis: Secondary | ICD-10-CM | POA: Diagnosis not present

## 2015-10-04 HISTORY — PX: EP IMPLANTABLE DEVICE: SHX172B

## 2015-10-04 LAB — SURGICAL PCR SCREEN
MRSA, PCR: NEGATIVE
STAPHYLOCOCCUS AUREUS: NEGATIVE

## 2015-10-04 SURGERY — PPM/BIV PPM GENERATOR CHANGEOUT
Anesthesia: LOCAL

## 2015-10-04 MED ORDER — MIDAZOLAM HCL 5 MG/5ML IJ SOLN
INTRAMUSCULAR | Status: DC | PRN
  Administered 2015-10-04: 1 mg via INTRAVENOUS

## 2015-10-04 MED ORDER — ACETAMINOPHEN 325 MG PO TABS: 325.0000 mg | ORAL_TABLET | ORAL | Status: DC | PRN

## 2015-10-04 MED ORDER — MUPIROCIN 2 % EX OINT
TOPICAL_OINTMENT | CUTANEOUS | Status: AC
  Filled 2015-10-04: qty 22

## 2015-10-04 MED ORDER — SODIUM CHLORIDE 0.9% FLUSH: 3.0000 mL | INTRAVENOUS | Status: DC | PRN

## 2015-10-04 MED ORDER — LIDOCAINE HCL (PF) 1 % IJ SOLN
INTRAMUSCULAR | Status: AC
  Filled 2015-10-04: qty 60

## 2015-10-04 MED ORDER — SODIUM CHLORIDE 0.9 % IV SOLN
INTRAVENOUS | Status: DC
  Administered 2015-10-04: 12:00:00 via INTRAVENOUS

## 2015-10-04 MED ORDER — GENTAMICIN SULFATE 40 MG/ML IJ SOLN
INTRAMUSCULAR | Status: AC
  Filled 2015-10-04: qty 2

## 2015-10-04 MED ORDER — ONDANSETRON HCL 4 MG/2ML IJ SOLN: 4.0000 mg | Freq: Four times a day (QID) | INTRAMUSCULAR | Status: DC | PRN

## 2015-10-04 MED ORDER — FENTANYL CITRATE (PF) 100 MCG/2ML IJ SOLN
INTRAMUSCULAR | Status: AC
  Filled 2015-10-04: qty 2

## 2015-10-04 MED ORDER — LIDOCAINE HCL (PF) 1 % IJ SOLN
INTRAMUSCULAR | Status: DC | PRN
  Administered 2015-10-04: 33 mL

## 2015-10-04 MED ORDER — SODIUM CHLORIDE 0.9 % IR SOLN
80.0000 mg | Status: AC
  Administered 2015-10-04: 80 mg

## 2015-10-04 MED ORDER — FENTANYL CITRATE (PF) 100 MCG/2ML IJ SOLN
INTRAMUSCULAR | Status: DC | PRN
  Administered 2015-10-04: 25 ug via INTRAVENOUS

## 2015-10-04 MED ORDER — CHLORHEXIDINE GLUCONATE 4 % EX LIQD: 60.0000 mL | Freq: Once | CUTANEOUS | Status: DC

## 2015-10-04 MED ORDER — CEFAZOLIN SODIUM-DEXTROSE 2-3 GM-% IV SOLR
INTRAVENOUS | Status: AC
  Filled 2015-10-04: qty 50

## 2015-10-04 MED ORDER — DEXTROSE 5 % IV SOLN
2.0000 g | INTRAVENOUS | Status: AC
  Administered 2015-10-04: 2 g via INTRAVENOUS
  Filled 2015-10-04: qty 20

## 2015-10-04 MED ORDER — SODIUM CHLORIDE 0.9 % IV SOLN: INTRAVENOUS | Status: DC

## 2015-10-04 MED ORDER — HEPARIN (PORCINE) IN NACL 2-0.9 UNIT/ML-% IJ SOLN: INTRAMUSCULAR | Status: DC | PRN

## 2015-10-04 MED ORDER — MIDAZOLAM HCL 2 MG/2ML IJ SOLN
INTRAMUSCULAR | Status: AC
  Filled 2015-10-04: qty 2

## 2015-10-04 MED ORDER — MUPIROCIN 2 % EX OINT
1.0000 "application " | TOPICAL_OINTMENT | Freq: Once | CUTANEOUS | Status: AC
  Administered 2015-10-04: 1 via TOPICAL

## 2015-10-04 SURGICAL SUPPLY — 5 items
CABLE SURGICAL S-101-97-12 (CABLE) ×4 IMPLANT
PACEMAKER ADAPTA DR ADDRL1 (Pacemaker) ×1 IMPLANT
PAD DEFIB LIFELINK (PAD) ×2 IMPLANT
PPM ADAPTA DR ADDRL1 (Pacemaker) ×2 IMPLANT
TRAY PACEMAKER INSERTION (PACKS) ×2 IMPLANT

## 2015-10-04 NOTE — Op Note (Addendum)
Procedure report  Procedure performed:  1. Dual chamber pacemaker generator changeout  2. Light sedation  Reason for procedure:  1. Device generator at elective replacement interval  2. Complete heart block Procedure performed by:  Sanda Klein, MD  Complications:  None  Estimated blood loss:  <5 mL  Medications administered during procedure:  Ancef 2 g intravenously, lidocaine 1% 30 mL locally, fentanyl 25 mcg intravenously, Versed 1 mg intravenously Device details:   New Generator Medtronic Adapta L model number ADDRL1, serial number [id number] Right atrial lead (chronic) St. Jude, model number P3830362, serial [id number] (implanted 12/02/1998) Right ventricular lead (chronic)  St. Jude, model number 1336T, serial number [id number] (implanted 12/02/1998)  Sedation started at 1436h, procedure completed at 1503h  Procedure details:  After the risks and benefits of the procedure were discussed the patient provided informed consent. She was brought to the cardiac catheter lab in the fasting state. The patient was prepped and draped in usual sterile fashion. Local anesthesia with 1% lidocaine was administered to to the left infraclavicular area. A 5-6cm horizontal incision was made parallel with and 3-4 cm caudal to the right clavicle An older scar was seen closer to the left clavicle. Using minimal electrocautery and mostly sharp and blunt dissection the prepectoral pocket was opened carefully to avoid injury to the loops of chronic leads. Extensive dissection was not necessary. The device was explanted. The pocket was carefully inspected for hemostasis and flushed with copious amounts of antibiotic solution.  The leads were disconnected from the old generator and immediately connected to the new generator, with appropriate pacing noted. Testing of the lead parameters later showed excellent values.   The entire system was then carefully inserted in the pocket with care been taking that the  leads and device assumed a comfortable position without pressure on the incision. Great care was taken that the leads be located deep to the generator. The pocket was then closed in layers using 2 layers of 2-0 Vicryl and cutaneous staples after which a sterile dressing was applied.   At the end of the procedure the following lead parameters were encountered:   Right atrial lead sensed P waves 1.4-2.0 mV, impedance 383 ohms, threshold 0.5 at 0.4 ms pulse width.  Right ventricular lead sensed R waves  None detected,  impedance 699 ohms, threshold 0.4 at 0.4 ms pulse width.  Sanda Klein, MD, [facility] [phone] office [phone] pager 10/04/2015 3:17 PM

## 2015-10-04 NOTE — Interval H&P Note (Signed)
History and Physical Interval Note:  10/04/2015 2:16 PM  Kathryn [name]  has presented today for surgery, with the diagnosis of ppm eol  The various methods of treatment have been discussed with the patient and family. After consideration of risks, benefits and other options for treatment, the patient has consented to  Procedure(s): PPM/BIV PPM Generator Changeout (N/A) as a surgical intervention .  The patient's history has been reviewed, patient examined, no change in status, stable for surgery.  I have reviewed the patient's chart and labs.  Questions were answered to the patient's satisfaction.     Strother Everitt

## 2015-10-04 NOTE — Discharge Instructions (Signed)

## 2015-10-04 NOTE — H&P (View-Only) (Signed)
Patient ID: Kathryn [name], female   DOB: 24-Jul-1927, 82 y.o.   MRN: [id number]    Cardiology Office Note    Date:  09/14/2015   ID:  Kathryn [name], DOB 21-Mar-1928, MRN [id number]  PCP:  Jerlyn Ly, MD  Cardiologist:   Sanda Klein, MD   Chief Complaint  Patient presents with  . Annual Exam    no chest pain, occassional shortness of breath, no edema, has occassional cramping in legs, no lightheadedness or dizziness,occassional fatigue    History of Present Illness:  Kathryn [name] is a 82 y.o. female with complete heart block and history of paroxysmal atrial fibrillation returning for evaluation after her pacemaker reached elective replacement indicator on 07/24/2015. She complains of some fatigue, possibly related to the fact the device is now operating VVI 65 bpm without rate response. Interestingly, her atrial rate is pretty close to this, so her EKG mimics a sensed V paced rhythm, although on closer analysis the ventricular pacing is clearly a synchronous.  Her initial device was implanted in 2000 and the original leads are still in use. Her current generator is a Medtronic Adapta implanted in 2008. She is pacemaker dependent. Unfortunately lead analysis cannot be performed on this device at [facility].  Despite high embolic risk (CHADSVasc 4: age 20, gender, HTN) he has declined anticoagulation therapy in the past. She has never had an embolic event and does not have serious bleeding problems, although she does describe easy bruising even when taking only aspirin.    Past Medical History  Diagnosis Date  . DVT (deep venous thrombosis) ([place]) 2010  . Bradycardia   . Syncope     permanent pacemaker 2008  . Presence of permanent cardiac pacemaker     COMPLETE HEART BLOCK--DR. Emmalena Canny   . Hypertension   . GERD (gastroesophageal reflux disease)   . Arthritis     HANDS  . Colostomy in place [facility])     S/P COLON RESECTION AND COLOSTOMY 08/27/13 FOR COLON PERFORATION  . Dysrhythmia    HX OF PAROXYSMAL ATRIAL FIB- TAKES ASPIRIN FOR BLOOD THINNER  . Peripheral vascular disease ([place])     Blood clot behind Right knee    Past Surgical History  Procedure Laterality Date  . Pacemaker insertion  12/02/1998    medtronic  . Cholecystectomy    . Abdominal hysterectomy    . Appendectomy    . Tonsillectomy    . Cataract extraction, bilateral    . Laparotomy N/A 08/27/2013    Procedure: LAPAROSCOPY CONVERTED TO OPEN LAPAROTOMY WITH SIGMOID COLECTOMY AND COLOSTOMY;  Surgeon: Shann Medal, MD;  Location: [facility];  Service: General;  Laterality: N/A;  . Permanent pacemaker generator change  02/28/2007    Medtronic Adapta  . Breast enhancement surgery    . US echocardiography  10/09/2011    mild LAE, mild MR,mild to mod TR  . Colonoscopy N/A 03/05/2014    Procedure: COLONOSCOPY;  Surgeon: Shann Medal, MD;  Location: [facility];  Service: General;  Laterality: N/A;  . Colostomy closure N/A 05/21/2014    Procedure: LAPAROSCOPIC ASSISTED CLOSURE OF COLOSTOMY AND REPAIR OF TRANSVERSE COLON AND ENTEROTOMY;  Surgeon: Alphonsa Overall, MD;  Location: [facility];  Service: General;  Laterality: N/A;  . Open reduction internal fixation (orif) distal radial fracture Left 02/15/2015    Procedure: OPEN TREATMENT OF LEFT DISTAL RADIUS FRACTURE;  Surgeon: Milly Jakob, MD;  Location: [place];  Service: Orthopedics;  Laterality: Left;    Outpatient  Prescriptions Prior to Visit  Medication Sig Dispense Refill  . acetaminophen (TYLENOL) 500 MG tablet Take 1 tablet (500 mg total) by mouth every 6 (six) hours as needed. 50 tablet 0  . aspirin EC 81 MG tablet Take 81 mg by mouth every morning.     . Calcium Carbonate-Vitamin D (CALTRATE 600+D PO) Take 1 tablet by mouth every morning.    . metoprolol tartrate (LOPRESSOR) 25 MG tablet Take 12.5 mg by mouth 2 (two) times daily.     . valsartan (DIOVAN) 80 MG tablet Take 0.5 tablets by mouth every morning.     . vitamin C (ASCORBIC ACID) 500  MG tablet Take 500 mg by mouth every morning.     No facility-administered medications prior to visit.     Allergies:   Codeine; Darvocet; Epinephrine; and Percocet   Social History   Social History  . Marital Status: Married    Spouse Name: N/A  . Number of Children: N/A  . Years of Education: N/A   Social History Main Topics  . Smoking status: Never Smoker   . Smokeless tobacco: Never Used  . Alcohol Use: No  . Drug Use: No  . Sexual Activity: No   Other Topics Concern  . None   Social History Narrative     Family History:  The patient's family history includes Heart disease in her father and mother.   ROS:   Please see the history of present illness.    ROS All other systems reviewed and are negative.   PHYSICAL EXAM:   VS:  BP 146/76 mmHg  Pulse 62  Ht 5' 1.75" (1.568 m)  Wt 53.78 kg (118 lb 9 oz)  BMI 21.87 kg/m2   GEN: Well nourished, well developed, in no acute distress HEENT: normal Neck: no JVD, carotid bruits, or masses Cardiac: Paradoxically split second heart sound ,RRR; no murmurs, rubs, or gallops,no edema ; her right subclavian pacemaker site appears healthy although there is some degree of skin atrophy at the scar Respiratory:  clear to auscultation bilaterally, normal work of breathing GI: soft, nontender, nondistended, + BS MS: no deformity or atrophy Skin: warm and dry, no rash Neuro:  Alert and Oriented x 3, Strength and sensation are intact Psych: euthymic mood, full affect  Wt Readings from Last 3 Encounters:  09/13/15 53.78 kg (118 lb 9 oz)  04/29/15 52.164 kg (115 lb)  02/15/15 51.256 kg (113 lb)      Studies/Labs Reviewed:   EKG:  EKG is ordered today.  The ekg ordered today demonstrates sinus rhythm with complete heart block and asynchronous ventricular pacing  Recent Labs: 02/14/2015: BUN 11; Creatinine, Ser 0.84; Potassium 4.3; Sodium 138 02/15/2015: Hemoglobin 10.4*   Lipid Panel No results found for: CHOL, TRIG, HDL,  CHOLHDL, VLDL, LDLCALC, LDLDIRECT   ASSESSMENT:    1. Pacemaker battery depletion   2. Pacemaker - dual-chamber Medtronic   3. Complete heart block ([place])   4. PAF (paroxysmal atrial fibrillation) ([place])   5. Essential hypertension   6. Medication management      PLAN:  In order of problems listed above:  1. PM at [facility]: Need to schedule for generator change out. Discussed the potential risks and complications. This will be the third surgery on the site and it may be wise to implant a Tyrex antibiotic impregnated pouch. 2. PM: Pacemaker at elective replacement interval since January 15. Need to schedule to change out no later than April 15 3. CHB: Pacemaker dependent  due to complete heart block 4. AFib: I again encouraged her to consider anticoagulation for stroke prevention. We reviewed the relative benefit and bleeding risk for no therapy versus aspirin versus direct oral anticoagulant. She will consider these options. Would also consider watchman device if she declines anticoagulation. 5. HTN: Slightly elevated blood pressure today, but no changes were made in medications. Review blood pressure at the time of her pacemaker change out before we adjust medications.     Medication Adjustments/Labs and Tests Ordered: Current medicines are reviewed at length with the patient today.  Concerns regarding medicines are outlined above.  Medication changes, Labs and Tests ordered today are listed in the Patient Instructions below. Patient Instructions  Your physician has recommended that you have a PACEMAKER BATTERY REPLACEMENT Tuesday 09/27/15 - [employer]. A pacemaker is a small device that is placed under the skin of your chest or abdomen to help control abnormal heart rhythms. This device uses electrical pulses to prompt the heart to beat at a normal rate. Pacemakers are used to treat heart rhythms that are too slow. Wire (leads) are attached to the pacemaker that goes into the chambers of you  heart. This is done in the hospital and usually requires and overnight stay. Please see the instruction sheet given to you today for more information.  Your physician recommends that you return for lab work in: Friday 09/23/15 AT [facility].         Mikael Spray, MD  09/14/2015 11:15 AM    [facility] [place], [place], [place]  [address] Phone: [phone]; Fax: [phone]

## 2015-10-05 ENCOUNTER — Encounter (HOSPITAL_COMMUNITY): Payer: Self-pay | Admitting: Cardiovascular Disease

## 2015-10-05 MED FILL — Gentamicin Sulfate Inj 40 MG/ML: INTRAMUSCULAR | Qty: 2 | Status: AC

## 2015-10-05 MED FILL — Sodium Chloride Irrigation Soln 0.9%: Qty: 500 | Status: AC

## 2015-10-10 ENCOUNTER — Telehealth: Payer: Self-pay | Admitting: Cardiovascular Disease

## 2015-10-10 NOTE — Telephone Encounter (Signed)
New message      Pt had a pacemaker battery change out last week.  Can she take the bandage off?  Will she get an ID card in the mail?

## 2015-10-10 NOTE — Telephone Encounter (Signed)
Patient asked if she could remove her bandage since she had a gen change on 3/28. I told her that at this time it would be ok to take it off and not to be alarmed that there are staples underneath. I explained to her that the staples would be removed at her wound check appt.   She asked when she would be getting her ID card in the mail. I told her that it would get to her within 2-3 weeks after her implant.   I told her that she could shower on Friday since that's what was stated on her discharge instructions.   Patient voiced understanding to all information.

## 2015-10-17 ENCOUNTER — Ambulatory Visit (INDEPENDENT_AMBULATORY_CARE_PROVIDER_SITE_OTHER): Payer: Medicare Other | Admitting: *Deleted

## 2015-10-17 DIAGNOSIS — I480 Paroxysmal atrial fibrillation: Secondary | ICD-10-CM

## 2015-10-17 DIAGNOSIS — I442 Atrioventricular block, complete: Secondary | ICD-10-CM

## 2015-10-17 LAB — CUP PACEART INCLINIC DEVICE CHECK
Brady Statistic AP VS Percent: 0 %
Implantable Lead Implant Date: 20000522
Implantable Lead Implant Date: 20000526
Implantable Lead Location: 753860
Lead Channel Impedance Value: 420 Ohm
Lead Channel Pacing Threshold Amplitude: 0.5 V
Lead Channel Pacing Threshold Amplitude: 0.5 V
Lead Channel Pacing Threshold Amplitude: 0.75 V
Lead Channel Pacing Threshold Pulse Width: 0.4 ms
Lead Channel Pacing Threshold Pulse Width: 0.4 ms
Lead Channel Setting Pacing Amplitude: 1.5 V
Lead Channel Setting Pacing Pulse Width: 0.4 ms
Lead Channel Setting Sensing Sensitivity: 2.8 mV
MDC IDC LEAD LOCATION: 753859
MDC IDC MSMT BATTERY IMPEDANCE: 100 Ohm
MDC IDC MSMT BATTERY REMAINING LONGEVITY: 142 mo
MDC IDC MSMT BATTERY VOLTAGE: 2.8 V
MDC IDC MSMT LEADCHNL RA PACING THRESHOLD AMPLITUDE: 0.5 V
MDC IDC MSMT LEADCHNL RA PACING THRESHOLD PULSEWIDTH: 0.4 ms
MDC IDC MSMT LEADCHNL RA SENSING INTR AMPL: 2 mV
MDC IDC MSMT LEADCHNL RV IMPEDANCE VALUE: 716 Ohm
MDC IDC MSMT LEADCHNL RV PACING THRESHOLD PULSEWIDTH: 0.4 ms
MDC IDC MSMT LEADCHNL RV SENSING INTR AMPL: 15.67 mV
MDC IDC SESS DTM: 20170410173103
MDC IDC SET LEADCHNL RV PACING AMPLITUDE: 2 V
MDC IDC STAT BRADY AP VP PERCENT: 83 %
MDC IDC STAT BRADY AS VP PERCENT: 17 %
MDC IDC STAT BRADY AS VS PERCENT: 0 %

## 2015-10-17 NOTE — Progress Notes (Signed)
Wound check appointment. Staples removed. Wound without redness or edema. Incision edges approximated, wound well healed. Normal device function. Thresholds, sensing, and impedances consistent with implant measurements. Device programmed at appropriate safety margins. Histogram distribution appropriate for patient and level of activity. (3) mode switches (<0.1%)--all <30 sec, no EGMs. No high ventricular rates noted. Patient educated about wound care and arm mobility. ROV in 3 months with [facility].

## 2015-12-09 ENCOUNTER — Encounter: Payer: Self-pay | Admitting: Cardiovascular Disease

## 2015-12-20 ENCOUNTER — Encounter: Payer: Self-pay | Admitting: Cardiovascular Disease

## 2015-12-20 ENCOUNTER — Ambulatory Visit (INDEPENDENT_AMBULATORY_CARE_PROVIDER_SITE_OTHER): Payer: Medicare Other | Admitting: Cardiovascular Disease

## 2015-12-20 VITALS — BP 138/70 | HR 80 | Ht 64.0 in | Wt 120.0 lb

## 2015-12-20 DIAGNOSIS — I442 Atrioventricular block, complete: Secondary | ICD-10-CM

## 2015-12-20 DIAGNOSIS — I480 Paroxysmal atrial fibrillation: Secondary | ICD-10-CM

## 2015-12-20 DIAGNOSIS — I10 Essential (primary) hypertension: Secondary | ICD-10-CM | POA: Diagnosis not present

## 2015-12-20 DIAGNOSIS — Z950 Presence of cardiac pacemaker: Secondary | ICD-10-CM

## 2015-12-20 LAB — CUP PACEART INCLINIC DEVICE CHECK
Brady Statistic AP VP Percent: 73 %
Brady Statistic AP VS Percent: 0 %
Brady Statistic AS VP Percent: 27 %
Date Time Interrogation Session: 20170613130852
Implantable Lead Implant Date: 20000522
Implantable Lead Location: 753859
Lead Channel Impedance Value: 730 Ohm
Lead Channel Pacing Threshold Amplitude: 0.375 V
Lead Channel Pacing Threshold Amplitude: 0.75 V
Lead Channel Pacing Threshold Pulse Width: 0.4 ms
Lead Channel Pacing Threshold Pulse Width: 0.4 ms
Lead Channel Sensing Intrinsic Amplitude: 1.4 mV
Lead Channel Setting Sensing Sensitivity: 2.8 mV
MDC IDC LEAD IMPLANT DT: 20000526
MDC IDC LEAD LOCATION: 753860
MDC IDC MSMT BATTERY IMPEDANCE: 100 Ohm
MDC IDC MSMT BATTERY REMAINING LONGEVITY: 143 mo
MDC IDC MSMT BATTERY VOLTAGE: 2.8 V
MDC IDC MSMT LEADCHNL RA IMPEDANCE VALUE: 409 Ohm
MDC IDC SET LEADCHNL RA PACING AMPLITUDE: 1.5 V
MDC IDC SET LEADCHNL RV PACING AMPLITUDE: 2 V
MDC IDC SET LEADCHNL RV PACING PULSEWIDTH: 0.4 ms
MDC IDC STAT BRADY AS VS PERCENT: 0 %

## 2015-12-20 NOTE — Patient Instructions (Addendum)
Dr Sallyanne Kuster recommends that you continue on your current medications as directed. Please refer to the Current Medication list given to you today.  Remote monitoring is used to monitor your Pacemaker of ICD from home. This monitoring reduces the number of office visits required to check your device to one time per year. It allows [place] to keep an eye on the functioning of your device to ensure it is working properly. You are scheduled for a device check from home on Tuesday, September 12th, 2017. You may send your transmission at any time that day. If you have a wireless device, the transmission will be sent automatically. After your physician reviews your transmission, you will receive a postcard with your next transmission date.  Dr Sallyanne Kuster recommends that you schedule a follow-up appointment in 12 months with a pacemaker check. You will receive a reminder letter in the mail two months in advance. If you don't receive a letter, please call our office to schedule the follow-up appointment.  If you need a refill on your cardiac medications before your next appointment, please call your pharmacy.

## 2015-12-20 NOTE — Progress Notes (Signed)
Patient ID: Kathryn [name], female   DOB: July 22, 1927, 82 y.o.   MRN: [id number]    Cardiology Office Note    Date:  12/20/2015   ID:  NANDITA LIGHTLE, DOB [birth year], MRN [id number]  PCP:  Jerlyn Ly, MD  Cardiologist:   Sanda Klein, MD   Chief Complaint  Patient presents with  . Follow-up    Patient has no complaints.    History of Present Illness:  Kathryn [name] is a 82 y.o. female with complete heart block and history of paroxysmal atrial fibrillation returning for evaluation after her pacemaker Generator change out about 3 months ago. She no longer complains of fatigue.  Her husband passed away around [place]. He had been living in a nursing home for several months and had been quite ill. She has good social support and one of her children has moved in with her.  Her initial device was implanted in 2000 and the original leads are still in use. Her current generator is a Medtronic Adapta implanted in March 2017. She is pacemaker dependent. Lead parameters are good. No atrial fibrillation has been detected since her last device check. There is 73% atrial pacing and 100% ventricular pacing. At this point anticipated device longevity is 12 years.  Her pacemaker has documented infrequent but quite lengthy episodes of asymptomatic paroxysmal atrial fibrillation, most recently in November 2015 following abdominal surgery. Despite high embolic risk (CHADSVasc 4: age 34, gender, HTN) she has declined anticoagulation therapy in the past. She has never had an embolic event and does not have serious bleeding problems, although she does describe easy bruising even when taking only aspirin.  She has complete heart block and a dual-chamber Medtronic pacemaker which was implanted in 2000, with a generator change out in 2008. In 2000 she presented with what appeared to be an acute anterior wall myocardial infarction following a bradycardia-related ventricular fibrillation episode during treadmill  stress testing as an outpatient. She underwent emergency cardiac catheterization and was found to have normal coronary arteries with a Takotsubo cardiomyopathy. She has treated systemic hypertension. She has infrequent but lengthy episodes of asymptomatic paroxysmal atrial fibrillation.  Past Medical History  Diagnosis Date  . DVT (deep venous thrombosis) ([place]) 2010  . Bradycardia   . Syncope     permanent pacemaker 2008  . Presence of permanent cardiac pacemaker     COMPLETE HEART BLOCK--DR. Demira Gwynne   . Hypertension   . GERD (gastroesophageal reflux disease)   . Arthritis     HANDS  . Colostomy in place [facility])     S/P COLON RESECTION AND COLOSTOMY 08/27/13 FOR COLON PERFORATION  . Dysrhythmia     HX OF PAROXYSMAL ATRIAL FIB- TAKES ASPIRIN FOR BLOOD THINNER  . Peripheral vascular disease ([place])     Blood clot behind Right knee    Past Surgical History  Procedure Laterality Date  . Pacemaker insertion  12/02/1998    medtronic  . Cholecystectomy    . Abdominal hysterectomy    . Appendectomy    . Tonsillectomy    . Cataract extraction, bilateral    . Laparotomy N/A 08/27/2013    Procedure: LAPAROSCOPY CONVERTED TO OPEN LAPAROTOMY WITH SIGMOID COLECTOMY AND COLOSTOMY;  Surgeon: Shann Medal, MD;  Location[facility];  Service: General;  Laterality: N/A;  . Permanent pacemaker generator change  02/28/2007    Medtronic Adapta  . Breast enhancement surgery    . US echocardiography  10/09/2011    mild LAE, mild MR,mild to  mod TR  . Colonoscopy N/A 03/05/2014    Procedure: COLONOSCOPY;  Surgeon: Shann Medal, MD;  Location: [facility];  Service: General;  Laterality: N/A;  . Colostomy closure N/A 05/21/2014    Procedure: LAPAROSCOPIC ASSISTED CLOSURE OF COLOSTOMY AND REPAIR OF TRANSVERSE COLON AND ENTEROTOMY;  Surgeon: Alphonsa Overall, MD;  [facility];  Service: General;  Laterality: N/A;  . Open reduction internal fixation (orif) distal radial fracture Left 02/15/2015    Procedure:  OPEN TREATMENT OF LEFT DISTAL RADIUS FRACTURE;  Surgeon: Milly Jakob, MD;  Location: [place];  Service: Orthopedics;  Laterality: Left;  . Ep implantable device N/A 10/04/2015    Procedure: PPM/BIV PPM Generator Changeout;  Surgeon: Sanda Klein, MD;  Location: [facility];  Service: Cardiovascular;  Laterality: N/A;    Outpatient Prescriptions Prior to Visit  Medication Sig Dispense Refill  . acetaminophen (TYLENOL) 500 MG tablet Take 1 tablet (500 mg total) by mouth every 6 (six) hours as needed. (Patient taking differently: Take 1,000 mg by mouth daily as needed for mild pain. ) 50 tablet 0  . aspirin EC 81 MG tablet Take 81 mg by mouth every morning.     . Calcium Carbonate-Vitamin D (CALTRATE 600+D PO) Take 1 tablet by mouth every morning.    . metoprolol tartrate (LOPRESSOR) 25 MG tablet Take 12.5 mg by mouth 2 (two) times daily.     . nitrofurantoin, macrocrystal-monohydrate, (MACROBID) 100 MG capsule Take 100 mg by mouth daily. Reported on 10/17/2015    . Polyethyl Glycol-Propyl Glycol (SYSTANE) 0.4-0.3 % GEL ophthalmic gel Place 1 application into both eyes daily as needed (for dry eyes).    . valsartan (DIOVAN) 80 MG tablet Take 0.5 tablets by mouth every morning.     . vitamin C (ASCORBIC ACID) 500 MG tablet Take 500 mg by mouth daily as needed (for immune support).      No facility-administered medications prior to visit.     Allergies:   Codeine; Darvocet; Epinephrine; and Percocet   Social History   Social History  . Marital Status: Married    Spouse Name: N/A  . Number of Children: N/A  . Years of Education: N/A   Social History Main Topics  . Smoking status: Never Smoker   . Smokeless tobacco: Never Used  . Alcohol Use: No  . Drug Use: No  . Sexual Activity: No   Other Topics Concern  . None   Social History Narrative     Family History:  The patient's family history includes Heart disease in her father and mother.   ROS:     Please see the history of present illness.    ROS All other systems reviewed and are negative.   PHYSICAL EXAM:   VS:  BP 138/70 mmHg  Pulse 80  Ht 5\' 4"  (1.626 m)  Wt 54.432 kg (120 lb)  BMI 20.59 kg/m2   GEN: Well nourished, well developed, in no acute distress HEENT: normal Neck: no JVD, carotid bruits, or masses Cardiac: Paradoxically split second heart sound ,RRR; no murmurs, rubs, or gallops,no edema ; her right subclavian pacemaker site appears healthy  Respiratory:  clear to auscultation bilaterally, normal work of breathing GI: soft, nontender, nondistended, + BS MS: no deformity or atrophy Skin: warm and dry, no rash Neuro:  Alert and Oriented x 3, Strength and sensation are intact Psych: euthymic mood, full affect  Wt Readings from Last 3 Encounters:  12/20/15 54.432 kg (120 lb)  09/13/15 53.78 kg (118 lb 9 oz)  04/29/15 52.164 kg (115 lb)      Studies/Labs Reviewed:   EKG:  EKG is ordered today.  The ekg ordered today demonstrates sinus rhythm with complete heart block and asynchronous ventricular pacing  Recent Labs: 09/27/2015: ALT 14; BUN 20; Creat 1.00*; Hemoglobin 11.2*; Platelets 252; Potassium 4.4; Sodium 137   Lipid Panel No results found for: CHOL, TRIG, HDL, CHOLHDL, VLDL, LDLCALC, LDLDIRECT   ASSESSMENT:    1. Complete heart block ([facility])   2. Paroxysmal atrial fibrillation ([facility])      PLAN:  In order of problems listed above:  1. CHB: Pacemaker dependent due to complete heart block 2. PM: Normal device function. Remote download for 3 months and office visits to yearly. 3. AFib: I again encouraged her to consider anticoagulation for stroke prevention. Reviewed again the very weak benefit of aspirin as compared to a true anticoagulant. She is still reluctant and since there have been no new arrhythmia events since her last device check, I have no additional "ammunition" with which to back up my recommendations. I think she believes that her  husband's illness and demise had a lot to do with her tachyarrhythmia in the past. I have asked her to call me immediately should she develop even the most transient neurological deficits. Continue to monitor for recurrence of atrial fibrillation via her pacemaker.. 4. HTN: Slightly elevated blood pressure today, but no changes were made in medications. Review blood pressure at the time of her pacemaker change out before we adjust medications.     Medication Adjustments/Labs and Tests Ordered: Current medicines are reviewed at length with the patient today.  Concerns regarding medicines are outlined above.  Medication changes, Labs and Tests ordered today are listed in the Patient Instructions below. Patient Instructions  Dr Sallyanne Kuster recommends that you continue on your current medications as directed. Please refer to the Current Medication list given to you today.  Remote monitoring is used to monitor your Pacemaker of ICD from home. This monitoring reduces the number of office visits required to check your device to one time per year. It allows [place] to keep an eye on the functioning of your device to ensure it is working properly. You are scheduled for a device check from home on Tuesday, September 12th, 2017. You may send your transmission at any time that day. If you have a wireless device, the transmission will be sent automatically. After your physician reviews your transmission, you will receive a postcard with your next transmission date.  Dr Sallyanne Kuster recommends that you schedule a follow-up appointment in 12 months with a pacemaker check. You will receive a reminder letter in the mail two months in advance. If you don't receive a letter, please call our office to schedule the follow-up appointment.  If you need a refill on your cardiac medications before your next appointment, please call your pharmacy.       Signed, Sanda Klein, MD  12/20/2015 4:45 PM    [facility] [place], [place], [place]  [address] Phone: [phone]; Fax: [phone]

## 2015-12-22 ENCOUNTER — Encounter: Payer: Self-pay | Admitting: Cardiovascular Disease

## 2016-02-08 ENCOUNTER — Ambulatory Visit (HOSPITAL_COMMUNITY)
Admission: RE | Admit: 2016-02-08 | Discharge: 2016-02-08 | Disposition: A | Discharge status: Home / Self Care | Payer: Medicare Other | Source: Ambulatory Visit | Attending: Internal Medicine | Admitting: Internal Medicine

## 2016-02-08 ENCOUNTER — Other Ambulatory Visit (HOSPITAL_COMMUNITY): Payer: Self-pay | Admitting: Internal Medicine

## 2016-02-08 DIAGNOSIS — H539 Unspecified visual disturbance: Secondary | ICD-10-CM | POA: Diagnosis not present

## 2016-02-08 DIAGNOSIS — K219 Gastro-esophageal reflux disease without esophagitis: Secondary | ICD-10-CM | POA: Insufficient documentation

## 2016-02-08 DIAGNOSIS — I6523 Occlusion and stenosis of bilateral carotid arteries: Secondary | ICD-10-CM | POA: Insufficient documentation

## 2016-02-08 DIAGNOSIS — I10 Essential (primary) hypertension: Secondary | ICD-10-CM | POA: Insufficient documentation

## 2016-02-08 LAB — VAS US CAROTID
LCCADDIAS: 15 cm/s
LCCADSYS: 70 cm/s
LCCAPDIAS: 15 cm/s
LCCAPSYS: 108 cm/s
LEFT ECA DIAS: -7 cm/s
LEFT VERTEBRAL DIAS: 11 cm/s
LICADDIAS: -18 cm/s
LICAPDIAS: -15 cm/s
LICAPSYS: -42 cm/s
Left ICA dist sys: -68 cm/s
RCCAPSYS: 92 cm/s
RIGHT CCA MID DIAS: 17 cm/s
RIGHT ECA DIAS: -8 cm/s
RIGHT VERTEBRAL DIAS: -13 cm/s
Right CCA prox dias: 12 cm/s
Right cca dist sys: -66 cm/s

## 2016-03-20 ENCOUNTER — Ambulatory Visit (INDEPENDENT_AMBULATORY_CARE_PROVIDER_SITE_OTHER): Payer: Medicare Other | Admitting: *Deleted

## 2016-03-20 ENCOUNTER — Telehealth: Payer: Self-pay | Admitting: Cardiology

## 2016-03-20 DIAGNOSIS — I442 Atrioventricular block, complete: Secondary | ICD-10-CM | POA: Diagnosis not present

## 2016-03-20 NOTE — Telephone Encounter (Signed)
Spoke with pt and reminded pt of remote transmission that is due today. Pt verbalized understanding.   

## 2016-03-21 NOTE — Telephone Encounter (Signed)
Patient called for assistance with sending remote transmission.  Walked patient through transmission steps, successfully received transmission.  Patient appreciative and denies additional questions or concerns at this time.

## 2016-03-21 NOTE — Progress Notes (Signed)
Remote pacemaker transmission.   

## 2016-03-22 ENCOUNTER — Encounter: Payer: Self-pay | Admitting: Cardiology

## 2016-03-29 LAB — CUP PACEART REMOTE DEVICE CHECK
Battery Impedance: 100 Ohm
Battery Remaining Longevity: 144 mo
Battery Voltage: 2.8 V
Brady Statistic AP VP Percent: 74 %
Implantable Lead Implant Date: 20000522
Implantable Lead Implant Date: 20000526
Implantable Lead Location: 753860
Lead Channel Impedance Value: 413 Ohm
Lead Channel Impedance Value: 788 Ohm
Lead Channel Pacing Threshold Pulse Width: 0.4 ms
Lead Channel Sensing Intrinsic Amplitude: 2.8 mV
Lead Channel Setting Pacing Amplitude: 2 V
Lead Channel Setting Pacing Pulse Width: 0.4 ms
Lead Channel Setting Sensing Sensitivity: 2.8 mV
MDC IDC LEAD LOCATION: 753859
MDC IDC MSMT LEADCHNL RA PACING THRESHOLD AMPLITUDE: 0.375 V
MDC IDC MSMT LEADCHNL RA PACING THRESHOLD PULSEWIDTH: 0.4 ms
MDC IDC MSMT LEADCHNL RV PACING THRESHOLD AMPLITUDE: 0.75 V
MDC IDC SESS DTM: 20170913122343
MDC IDC SET LEADCHNL RA PACING AMPLITUDE: 1.5 V
MDC IDC STAT BRADY AP VS PERCENT: 0 %
MDC IDC STAT BRADY AS VP PERCENT: 26 %
MDC IDC STAT BRADY AS VS PERCENT: 0 %

## 2016-06-19 ENCOUNTER — Ambulatory Visit (INDEPENDENT_AMBULATORY_CARE_PROVIDER_SITE_OTHER): Payer: Medicare Other | Admitting: *Deleted

## 2016-06-19 DIAGNOSIS — I442 Atrioventricular block, complete: Secondary | ICD-10-CM

## 2016-06-19 NOTE — Progress Notes (Signed)
Remote pacemaker transmission.   

## 2016-06-27 ENCOUNTER — Encounter: Payer: Self-pay | Admitting: Cardiology

## 2016-07-04 LAB — CUP PACEART REMOTE DEVICE CHECK
Battery Impedance: 111 Ohm
Brady Statistic AP VS Percent: 0 %
Brady Statistic AS VP Percent: 25 %
Brady Statistic AS VS Percent: 0 %
Date Time Interrogation Session: 20171212140558
Implantable Lead Implant Date: 20000522
Lead Channel Impedance Value: 420 Ohm
Lead Channel Impedance Value: 746 Ohm
Lead Channel Pacing Threshold Pulse Width: 0.4 ms
Lead Channel Pacing Threshold Pulse Width: 0.4 ms
Lead Channel Setting Sensing Sensitivity: 2.8 mV
MDC IDC LEAD IMPLANT DT: 20000526
MDC IDC LEAD LOCATION: 753859
MDC IDC LEAD LOCATION: 753860
MDC IDC MSMT BATTERY REMAINING LONGEVITY: 140 mo
MDC IDC MSMT BATTERY VOLTAGE: 2.8 V
MDC IDC MSMT LEADCHNL RA PACING THRESHOLD AMPLITUDE: 0.375 V
MDC IDC MSMT LEADCHNL RA SENSING INTR AMPL: 2.8 mV
MDC IDC MSMT LEADCHNL RV PACING THRESHOLD AMPLITUDE: 0.75 V
MDC IDC PG IMPLANT DT: 20170328
MDC IDC SET LEADCHNL RA PACING AMPLITUDE: 1.5 V
MDC IDC SET LEADCHNL RV PACING AMPLITUDE: 2 V
MDC IDC SET LEADCHNL RV PACING PULSEWIDTH: 0.4 ms
MDC IDC STAT BRADY AP VP PERCENT: 75 %

## 2016-09-18 ENCOUNTER — Ambulatory Visit (INDEPENDENT_AMBULATORY_CARE_PROVIDER_SITE_OTHER): Payer: Medicare Other | Admitting: *Deleted

## 2016-09-18 DIAGNOSIS — I442 Atrioventricular block, complete: Secondary | ICD-10-CM

## 2016-09-18 NOTE — Progress Notes (Signed)
Remote pacemaker transmission.   

## 2016-09-19 ENCOUNTER — Encounter: Payer: Self-pay | Admitting: Cardiology

## 2016-09-19 LAB — CUP PACEART REMOTE DEVICE CHECK
Battery Impedance: 111 Ohm
Battery Remaining Longevity: 138 mo
Battery Voltage: 2.8 V
Brady Statistic AP VP Percent: 76 %
Brady Statistic AP VS Percent: 0 %
Brady Statistic AS VP Percent: 24 %
Brady Statistic AS VS Percent: 0 %
Date Time Interrogation Session: 20180313152753
Implantable Lead Implant Date: 20000522
Implantable Lead Implant Date: 20000526
Implantable Lead Location: 753859
Implantable Lead Location: 753860
Implantable Pulse Generator Implant Date: 20170328
Lead Channel Impedance Value: 409 Ohm
Lead Channel Impedance Value: 707 Ohm
Lead Channel Pacing Threshold Amplitude: 0.5 V
Lead Channel Pacing Threshold Amplitude: 0.75 V
Lead Channel Pacing Threshold Pulse Width: 0.4 ms
Lead Channel Pacing Threshold Pulse Width: 0.4 ms
Lead Channel Sensing Intrinsic Amplitude: 2.8 mV
Lead Channel Setting Pacing Amplitude: 1.5 V
Lead Channel Setting Pacing Amplitude: 2 V
Lead Channel Setting Pacing Pulse Width: 0.4 ms
Lead Channel Setting Sensing Sensitivity: 2.8 mV

## 2016-10-30 ENCOUNTER — Encounter (HOSPITAL_COMMUNITY): Payer: Self-pay | Admitting: *Deleted

## 2016-10-30 NOTE — Progress Notes (Signed)
Spoke with pt and she stated that she canceled the procedure. Dr. Kristeen Miss staff is aware.

## 2016-11-01 SURGERY — Surgical Case
Anesthesia: *Unknown

## 2016-11-22 ENCOUNTER — Encounter (HOSPITAL_COMMUNITY): Admission: RE | Payer: Self-pay | Source: Ambulatory Visit

## 2016-11-22 SURGERY — REPAIR, BLEPHAROPTOSIS
Anesthesia: General | Laterality: Bilateral

## 2016-12-12 ENCOUNTER — Ambulatory Visit (INDEPENDENT_AMBULATORY_CARE_PROVIDER_SITE_OTHER): Payer: Medicare Other | Admitting: Cardiovascular Disease

## 2016-12-12 ENCOUNTER — Encounter: Payer: Self-pay | Admitting: Cardiovascular Disease

## 2016-12-12 VITALS — BP 130/84 | HR 76 | Ht 64.0 in | Wt 115.8 lb

## 2016-12-12 DIAGNOSIS — I442 Atrioventricular block, complete: Secondary | ICD-10-CM

## 2016-12-12 DIAGNOSIS — I480 Paroxysmal atrial fibrillation: Secondary | ICD-10-CM | POA: Diagnosis not present

## 2016-12-12 DIAGNOSIS — I10 Essential (primary) hypertension: Secondary | ICD-10-CM

## 2016-12-12 DIAGNOSIS — Z950 Presence of cardiac pacemaker: Secondary | ICD-10-CM

## 2016-12-12 NOTE — Progress Notes (Signed)
Patient ID: Kathryn [name], female   DOB: 1928-03-24, 83 y.o.   MRN: [id number]    Cardiology Office Note    Date:  12/12/2016   ID:  Kathryn [name], DOB 10-21-1927, MRN [id number]  PCP:  Crist Infante, MD  Cardiologist:   Sanda Klein, MD   Chief Complaint  Patient presents with  . Follow-up    History of Present Illness:  Kathryn [name] is a 83 y.o. female with complete heart block and history of paroxysmal atrial fibrillation returning for evaluation Of her pacemaker. She underwent a generator change out just over a year ago.Kathryn [name] Her husband passed away about the same time.  She is generally doing well. 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  He denies syncope, dizziness, palpitations, angina, dyspnea, excessive fatigue, edema, claudication, focal neurological deficits.  She had a small fall in the bathroom when she was cleaning and fractured 3 ribs. Dr. Joylene Draft has been encouraging her to start medications for osteoporosis, but she is reluctant.  He has lost a few more pounds and is now formally underweight with a BMI just under 20  Her initial device was implanted in 2000 and the original leads are still in use. Her current generator is a Medtronic Adapta implanted in March 2017. She is pacemaker dependent. Lead parameters are good. No atrial fibrillation has been detected since her last device check. There is 76% atrial pacing and 100% ventricular pacing. At this point anticipated device longevity is 11 years. Heart rate histogram distribution is appropriate.  In the past her pacemaker has recorded fairly lengthy episodes of atrial fibrillation, but none have been recorded since she had abdominal surgery in November 2015.  Despite high embolic risk (CHADSVasc 4: age 6, gender, HTN) she has declined anticoagulation therapy in the  past. She has never had an embolic event and does not have serious bleeding problems, although she does describe easy bruising even when taking only aspirin.  She has complete heart block and a dual-chamber Medtronic pacemaker which was implanted in 2000, with a generator change out in 2008. In 2000 she presented with what appeared to be an acute anterior wall myocardial infarction following a bradycardia-related ventricular fibrillation episode during treadmill stress testing as an outpatient. She underwent emergency cardiac catheterization and was found to have normal coronary arteries with a Takotsubo cardiomyopathy. She has treated systemic hypertension. She has infrequent but lengthy episodes of asymptomatic paroxysmal atrial fibrillation.  Past Medical History:  Diagnosis Date  . Arthritis    HANDS  . Bradycardia   . Colostomy in place [facility])    S/P COLON RESECTION AND COLOSTOMY 08/27/13 FOR COLON PERFORATION  . DVT (deep venous thrombosis) ([place]) 2010  . Dysrhythmia    HX OF PAROXYSMAL ATRIAL FIB- TAKES ASPIRIN FOR BLOOD THINNER  . GERD (gastroesophageal reflux disease)   . Hypertension   . Peripheral vascular disease ([place])    Blood clot behind Right knee  . Presence of permanent cardiac pacemaker    COMPLETE HEART BLOCK--DR. Kennetha [place]   . Syncope    permanent pacemaker 2008    Past Surgical History:  Procedure Laterality Date  . ABDOMINAL HYSTERECTOMY    . APPENDECTOMY    . BREAST ENHANCEMENT SURGERY    . CATARACT EXTRACTION, BILATERAL    . CHOLECYSTECTOMY    . COLONOSCOPY N/A 03/05/2014  Procedure: COLONOSCOPY;  Surgeon: Shann Medal, MD;  Location: [facility];  Service: General;  Laterality: N/A;  . COLOSTOMY CLOSURE N/A 05/21/2014   Procedure: LAPAROSCOPIC ASSISTED CLOSURE OF COLOSTOMY AND REPAIR OF TRANSVERSE COLON AND ENTEROTOMY;  Surgeon: Alphonsa Overall, MD;  Location: [facility];  Service: General;  Laterality: N/A;  . EP IMPLANTABLE DEVICE N/A 10/04/2015   Procedure:  PPM/BIV PPM Generator Changeout;  Surgeon: Sanda Klein, MD;  Location: [facility];  Service: Cardiovascular;  Laterality: N/A;  . LAPAROTOMY N/A 08/27/2013   Procedure: LAPAROSCOPY CONVERTED TO OPEN LAPAROTOMY WITH SIGMOID COLECTOMY AND COLOSTOMY;  Surgeon: Shann Medal, MD;  Location: [facility];  Service: General;  Laterality: N/A;  . OPEN REDUCTION INTERNAL FIXATION (ORIF) DISTAL RADIAL FRACTURE Left 02/15/2015   Procedure: OPEN TREATMENT OF LEFT DISTAL RADIUS FRACTURE;  Surgeon: Milly Jakob, MD;  Location: [place] [facility];  Service: Orthopedics;  Laterality: Left;  . PACEMAKER INSERTION  12/02/1998   medtronic  . PERMANENT PACEMAKER GENERATOR CHANGE  02/28/2007   Medtronic Adapta  . TONSILLECTOMY    . US ECHOCARDIOGRAPHY  10/09/2011   mild LAE, mild MR,mild to mod TR    Outpatient Medications Prior to Visit  Medication Sig Dispense Refill  . acetaminophen (TYLENOL) 500 MG tablet Take 1 tablet (500 mg total) by mouth every 6 (six) hours as needed. 50 tablet 0  . Ascorbic Acid (VITAMIN C PO) Take 1 tablet by mouth daily.    Kathryn [name] aspirin EC 81 MG tablet Take 81 mg by mouth every morning.     . Calcium 200 MG TABS Take 200 mg by mouth 2 (two) times daily.    . cholecalciferol (VITAMIN D) 1000 units tablet Take 1,000 Units by mouth daily.    . metoprolol tartrate (LOPRESSOR) 25 MG tablet Take 12.5 mg by mouth 2 (two) times daily.     . Omega 3 1200 MG CAPS Take 1,200 mg by mouth See admin instructions. Takes 1 capsule every morning. Takes an additional capsule as needed for dry eyes.    Vladimir Faster Glycol-Propyl Glycol (SYSTANE OP) Place 1 drop into both eyes daily as needed (dry eyes).     . valsartan (DIOVAN) 40 MG tablet Take 40 mg by mouth daily.    . vitamin B-12 (CYANOCOBALAMIN) 1000 MCG tablet Take 1,000 mcg by mouth daily.    Kathryn [name] HYDROcodone-acetaminophen (NORCO/VICODIN) 5-325 MG tablet Take 1 tablet by mouth every 6 (six) hours as needed for moderate pain.   0   No  facility-administered medications prior to visit.      Allergies:   Codeine; Darvocet [propoxyphene n-acetaminophen]; Percocet [oxycodone-acetaminophen]; and Epinephrine   Social History   Social History  . Marital status: Widowed    Spouse name: N/A  . Number of children: N/A  . Years of education: N/A   Social History Main Topics  . Smoking status: Never Smoker  . Smokeless tobacco: Never Used  . Alcohol use No  . Drug use: No  . Sexual activity: No   Other Topics Concern  . Not on file   Social History Narrative  . No narrative on file     Family History:  The patient's family history includes Heart disease in her father and mother.   ROS:   Please see the history of present illness.    ROS All other systems reviewed and are negative.   PHYSICAL EXAM:   VS:  BP 130/84   Pulse 76   Ht 5\' 4"  (  1.626 m)   Wt 115 lb 12.8 oz (52.5 kg)   BMI 19.88 kg/m    General: Alert, oriented x3, no distress. Well nourished, well developed Head: no evidence of trauma, PERRL, EOMI, no exophtalmos or lid lag, no myxedema, no xanthelasma; normal ears, nose and oropharynx Neck: normal jugular venous pulsations and no hepatojugular reflux; brisk carotid pulses without delay and no carotid bruits Chest: clear to auscultation, no signs of consolidation by percussion or palpation, normal fremitus, symmetrical and full respiratory excursions Cardiovascular: normal position and quality of the apical impulse, regular rhythm, normal first and second heart sounds, no murmurs, rubs or gallops. The subclavian pacemaker site Abdomen: no tenderness or distention, no masses by palpation, no abnormal pulsatility or arterial bruits, normal bowel sounds, no hepatosplenomegaly Extremities: no clubbing, cyanosis or edema; 2+ radial, ulnar and brachial pulses bilaterally; 2+ right femoral, posterior tibial and dorsalis pedis pulses; 2+ left femoral, posterior tibial and dorsalis pedis pulses; no subclavian or  femoral bruits Neurological: grossly nonfocal  Psych: euthymic mood, full affect  Wt Readings from Last 3 Encounters:  12/12/16 115 lb 12.8 oz (52.5 kg)  12/20/15 120 lb (54.4 kg)  09/13/15 118 lb 9 oz (53.8 kg)      Studies/Labs Reviewed:   EKG:  EKG is ordered today.  The ekg ordered today demonstrates AV sequential pacing with a positive R wave in lead V1. ASSESSMENT:    1. Complete heart block ([place])   2. Pacemaker - dual-chamber Medtronic   3. Paroxysmal atrial fibrillation (H[facility])   4. Essential hypertension      PLAN:  In order of problems listed above:  1. CHB: Pacemaker dependent due to complete heart block. Positive R wave in lead V1 suggests that her ventricular lead may be located in the coronary sinus/middle cardiac vein. This is confirmed by one of her old chest x-rays. 2. PM: Normal device function. Remote download for 3 months and office visits to yearly. 3. AFib: Another year has passed without recurrence of atrial fibrillation. She has been reluctant use anticoagulants and so far she was right. Again reminded her that any focal neurological event, even a transient one should be immediately reported and would drastically changed the risk/benefit ratio for anticoagulation. 4. HTN: Good blood pressure control     Medication Adjustments/Labs and Tests Ordered: Current medicines are reviewed at length with the patient today.  Concerns regarding medicines are outlined above.  Medication changes, Labs and Tests ordered today are listed in the Patient Instructions below. Patient Instructions  Dr Sallyanne Kuster recommends that you continue on your current medications as directed. Please refer to the Current Medication list given to you today.  Remote monitoring is used to monitor your Pacemaker or ICD from home. This monitoring reduces the number of office visits required to check your device to one time per year. It allows [place] to keep an eye on the functioning of your device  to ensure it is working properly. You are scheduled for a device check from home on Wednesday, September 5th, 2018. You may send your transmission at any time that day. If you have a wireless device, the transmission will be sent automatically. After your physician reviews your transmission, you will receive a notification with your next transmission date.  Dr Sallyanne Kuster recommends that you schedule a follow-up appointment in 12 months with a pacemaker check. You will receive a reminder letter in the mail two months in advance. If you don't receive a letter, please call our office  to schedule the follow-up appointment.  If you need a refill on your cardiac medications before your next appointment, please call your pharmacy.      Signed, Sanda Klein, MD  12/12/2016 9:58 AM    [facility] [address], [place], [place]  [address] Phone: [phone]; Fax: [phone]

## 2016-12-12 NOTE — Patient Instructions (Signed)
Dr Croitoru recommends that you continue on your current medications as directed. Please refer to the Current Medication list given to you today.  Remote monitoring is used to monitor your Pacemaker or ICD from home. This monitoring reduces the number of office visits required to check your device to one time per year. It allows us to keep an eye on the functioning of your device to ensure it is working properly. You are scheduled for a device check from home on Wednesday, September 5th, 2018. You may send your transmission at any time that day. If you have a wireless device, the transmission will be sent automatically. After your physician reviews your transmission, you will receive a notification with your next transmission date.  Dr Croitoru recommends that you schedule a follow-up appointment in 12 months with a pacemaker check. You will receive a reminder letter in the mail two months in advance. If you don't receive a letter, please call our office to schedule the follow-up appointment.  If you need a refill on your cardiac medications before your next appointment, please call your pharmacy. 

## 2016-12-18 ENCOUNTER — Other Ambulatory Visit: Payer: Self-pay | Admitting: Cardiovascular Disease

## 2016-12-19 ENCOUNTER — Ambulatory Visit (HOSPITAL_COMMUNITY)
Admission: RE | Admit: 2016-12-19 | Payer: Medicare Other | Source: Ambulatory Visit | Admitting: Oculoplastics Ophthalmology

## 2016-12-20 LAB — CUP PACEART INCLINIC DEVICE CHECK
Battery Impedance: 111 Ohm
Battery Remaining Longevity: 139 mo
Battery Voltage: 2.79 V
Brady Statistic AP VS Percent: 0 %
Brady Statistic AS VP Percent: 24 %
Date Time Interrogation Session: 20180606124949
Implantable Lead Implant Date: 20000522
Implantable Pulse Generator Implant Date: 20170328
Lead Channel Impedance Value: 414 Ohm
Lead Channel Impedance Value: 723 Ohm
Lead Channel Pacing Threshold Amplitude: 0.5 V
Lead Channel Pacing Threshold Amplitude: 0.5 V
Lead Channel Pacing Threshold Pulse Width: 0.4 ms
Lead Channel Pacing Threshold Pulse Width: 0.4 ms
Lead Channel Setting Sensing Sensitivity: 2.8 mV
MDC IDC LEAD IMPLANT DT: 20000526
MDC IDC LEAD LOCATION: 753859
MDC IDC LEAD LOCATION: 753860
MDC IDC MSMT LEADCHNL RA PACING THRESHOLD PULSEWIDTH: 0.4 ms
MDC IDC MSMT LEADCHNL RA SENSING INTR AMPL: 2.8 mV
MDC IDC MSMT LEADCHNL RV PACING THRESHOLD AMPLITUDE: 0.5 V
MDC IDC MSMT LEADCHNL RV PACING THRESHOLD AMPLITUDE: 0.75 V
MDC IDC MSMT LEADCHNL RV PACING THRESHOLD PULSEWIDTH: 0.4 ms
MDC IDC SET LEADCHNL RA PACING AMPLITUDE: 1.5 V
MDC IDC SET LEADCHNL RV PACING AMPLITUDE: 2 V
MDC IDC SET LEADCHNL RV PACING PULSEWIDTH: 0.4 ms
MDC IDC STAT BRADY AP VP PERCENT: 76 %
MDC IDC STAT BRADY AS VS PERCENT: 0 %

## 2017-03-15 ENCOUNTER — Telehealth: Payer: Self-pay | Admitting: Cardiology

## 2017-03-15 ENCOUNTER — Ambulatory Visit (INDEPENDENT_AMBULATORY_CARE_PROVIDER_SITE_OTHER): Payer: Medicare Other | Admitting: *Deleted

## 2017-03-15 DIAGNOSIS — I442 Atrioventricular block, complete: Secondary | ICD-10-CM | POA: Diagnosis not present

## 2017-03-15 NOTE — Telephone Encounter (Signed)
Spoke with pt and reminded pt of remote transmission that is due today. Pt verbalized understanding.   

## 2017-03-15 NOTE — Progress Notes (Signed)
Remote pacemaker transmission.   

## 2017-03-19 ENCOUNTER — Encounter: Payer: Self-pay | Admitting: Cardiology

## 2017-04-04 LAB — CUP PACEART REMOTE DEVICE CHECK
Battery Remaining Longevity: 133 mo
Date Time Interrogation Session: 20180907143841
Implantable Lead Implant Date: 20000526
Implantable Lead Location: 753859
Implantable Lead Location: 753860
Implantable Pulse Generator Implant Date: 20170328
Lead Channel Pacing Threshold Amplitude: 0.75 V
Lead Channel Setting Pacing Amplitude: 1.5 V
Lead Channel Setting Pacing Pulse Width: 0.4 ms
MDC IDC LEAD IMPLANT DT: 20000522
MDC IDC MSMT BATTERY IMPEDANCE: 135 Ohm
MDC IDC MSMT BATTERY VOLTAGE: 2.79 V
MDC IDC MSMT LEADCHNL RA IMPEDANCE VALUE: 420 Ohm
MDC IDC MSMT LEADCHNL RA PACING THRESHOLD AMPLITUDE: 0.375 V
MDC IDC MSMT LEADCHNL RA PACING THRESHOLD PULSEWIDTH: 0.4 ms
MDC IDC MSMT LEADCHNL RA SENSING INTR AMPL: 1.4 mV
MDC IDC MSMT LEADCHNL RV IMPEDANCE VALUE: 723 Ohm
MDC IDC MSMT LEADCHNL RV PACING THRESHOLD PULSEWIDTH: 0.4 ms
MDC IDC SET LEADCHNL RV PACING AMPLITUDE: 2 V
MDC IDC SET LEADCHNL RV SENSING SENSITIVITY: 2.8 mV
MDC IDC STAT BRADY AP VP PERCENT: 76 %
MDC IDC STAT BRADY AP VS PERCENT: 0 %
MDC IDC STAT BRADY AS VP PERCENT: 24 %
MDC IDC STAT BRADY AS VS PERCENT: 0 %

## 2017-05-14 ENCOUNTER — Other Ambulatory Visit: Payer: Self-pay | Admitting: Plastic Surgery

## 2017-05-14 DIAGNOSIS — T8544XA Capsular contracture of breast implant, initial encounter: Secondary | ICD-10-CM

## 2017-05-16 ENCOUNTER — Other Ambulatory Visit: Payer: Self-pay | Admitting: Physician Assistant

## 2017-05-16 DIAGNOSIS — T8543XS Leakage of breast prosthesis and implant, sequela: Secondary | ICD-10-CM

## 2017-05-16 DIAGNOSIS — Z9889 Other specified postprocedural states: Secondary | ICD-10-CM

## 2017-05-16 NOTE — Progress Notes (Signed)
Patient needs diagnostic digital mammography (palpable abnormality) with Ultrasound - For Left breast capsular contracture per Dr. Rogelia Rohrer Plastic Surgery [phone]

## 2017-05-20 ENCOUNTER — Other Ambulatory Visit: Payer: Self-pay | Admitting: Physician Assistant

## 2017-05-20 DIAGNOSIS — T8544XA Capsular contracture of breast implant, initial encounter: Secondary | ICD-10-CM

## 2017-05-23 ENCOUNTER — Ambulatory Visit
Admission: RE | Admit: 2017-05-23 | Discharge: 2017-05-23 | Disposition: A | Discharge status: Home / Self Care | Payer: Medicare Other | Source: Ambulatory Visit | Attending: Plastic Surgery | Admitting: Plastic Surgery

## 2017-05-23 ENCOUNTER — Ambulatory Visit: Payer: Medicare Other

## 2017-05-23 DIAGNOSIS — T8544XA Capsular contracture of breast implant, initial encounter: Secondary | ICD-10-CM

## 2017-06-17 ENCOUNTER — Ambulatory Visit (INDEPENDENT_AMBULATORY_CARE_PROVIDER_SITE_OTHER): Payer: Medicare Other | Admitting: *Deleted

## 2017-06-17 DIAGNOSIS — I442 Atrioventricular block, complete: Secondary | ICD-10-CM

## 2017-06-17 NOTE — Progress Notes (Signed)
Remote pacemaker transmission.   

## 2017-06-19 LAB — CUP PACEART REMOTE DEVICE CHECK
Battery Impedance: 135 Ohm
Battery Remaining Longevity: 133 mo
Brady Statistic AP VP Percent: 78 %
Brady Statistic AS VP Percent: 22 %
Implantable Lead Location: 753859
Implantable Pulse Generator Implant Date: 20170328
Lead Channel Impedance Value: 421 Ohm
Lead Channel Impedance Value: 723 Ohm
Lead Channel Pacing Threshold Amplitude: 0.375 V
Lead Channel Pacing Threshold Amplitude: 0.75 V
Lead Channel Sensing Intrinsic Amplitude: 1.4 mV
Lead Channel Setting Pacing Pulse Width: 0.4 ms
MDC IDC LEAD IMPLANT DT: 20000522
MDC IDC LEAD IMPLANT DT: 20000526
MDC IDC LEAD LOCATION: 753860
MDC IDC MSMT BATTERY VOLTAGE: 2.79 V
MDC IDC MSMT LEADCHNL RA PACING THRESHOLD PULSEWIDTH: 0.4 ms
MDC IDC MSMT LEADCHNL RV PACING THRESHOLD PULSEWIDTH: 0.4 ms
MDC IDC SESS DTM: 20181210150642
MDC IDC SET LEADCHNL RA PACING AMPLITUDE: 1.5 V
MDC IDC SET LEADCHNL RV PACING AMPLITUDE: 2 V
MDC IDC SET LEADCHNL RV SENSING SENSITIVITY: 2.8 mV
MDC IDC STAT BRADY AP VS PERCENT: 0 %
MDC IDC STAT BRADY AS VS PERCENT: 0 %

## 2017-06-21 ENCOUNTER — Encounter: Payer: Self-pay | Admitting: Cardiology

## 2017-07-04 ENCOUNTER — Encounter: Payer: Self-pay | Admitting: Cardiology

## 2017-07-04 ENCOUNTER — Ambulatory Visit: Payer: Medicare Other | Admitting: Cardiology

## 2017-07-04 DIAGNOSIS — I480 Paroxysmal atrial fibrillation: Secondary | ICD-10-CM

## 2017-07-04 DIAGNOSIS — Z950 Presence of cardiac pacemaker: Secondary | ICD-10-CM | POA: Diagnosis not present

## 2017-07-04 DIAGNOSIS — IMO0001 Reserved for inherently not codable concepts without codable children: Secondary | ICD-10-CM | POA: Insufficient documentation

## 2017-07-04 DIAGNOSIS — I10 Essential (primary) hypertension: Secondary | ICD-10-CM

## 2017-07-04 DIAGNOSIS — Z0389 Encounter for observation for other suspected diseases and conditions ruled out: Secondary | ICD-10-CM

## 2017-07-04 NOTE — Progress Notes (Signed)
Discussed with Dr Sallyanne Kuster. Pt is pacer dependent. She will ned her pacemaker programed to VOO by the Medtronic rep at the time of her breast surgery.  Kerin Ransom PA-C 07/04/2017 4:55 PM

## 2017-07-04 NOTE — Assessment & Plan Note (Signed)
Initial implantation 2000, generator change out in 2008, March 2017- MDT Pacemaker dependent

## 2017-07-04 NOTE — Assessment & Plan Note (Signed)
H/O PAF, pt declined anticoagulation

## 2017-07-04 NOTE — Patient Instructions (Signed)
Medication Instructions:  [facility] physician recommends that you continue on your current medications as directed. Please refer to the Current Medication list given to you today.  If you need a refill on your cardiac medications before your next appointment, please call your pharmacy.  Follow-Up: Your physician wants you to follow-up in: [place] should receive a reminder letter in the mail two months in advance. If you do not receive a letter, please call our office April 2019 to schedule the June 2019 follow-up appointment.   Thank you for choosing [facility] at [facility]!!

## 2017-07-04 NOTE — Progress Notes (Signed)
Thanks. Suspect they will use cautery for her breast surgery and she will need her pacemaker programmed VOO for that procedure. When the date is set we will call industry. MCr

## 2017-07-04 NOTE — Assessment & Plan Note (Signed)
Controlled.  

## 2017-07-04 NOTE — Progress Notes (Signed)
07/04/2017 Kathryn [name]   1927/12/25  [id number]  Primary Physician Crist Infante, MD Primary Cardiologist: Dr Sallyanne Kuster  HPI:  83 y/o female followed by Dr Sallyanne Kuster with a history of CHB, PAF, and pacemaker. She presented I 2000 with a Takotsubo event with VF followed by CHB. She had a MDT pacemaker placed then and it has been changed in '08 and March 2017. She is pacemaker dependent. She has had PAF but Dr Sallyanne Kuster says she has been in NSR over the past year. She is a CHASDS VASC 4 but has declined anticoagulation. She does take an 81 mg ASA daily.  She is in the offcie today for follow up. She has been doing well from a cardiac standpoint, no chest pain or tachycardia. She say she needs her breast implant replaced and will need cardiac clearance. She dropped a blender on her Rt leg over Thanksgiving and has a slow healing wound. Dr Perini's office is following this. She was told by the wound nurse there is no sign of infection.    Current Outpatient Medications  Medication Sig Dispense Refill  . acetaminophen (TYLENOL) 500 MG tablet Take 1 tablet (500 mg total) by mouth every 6 (six) hours as needed. 50 tablet 0  . Ascorbic Acid (VITAMIN C PO) Take 1 tablet by mouth daily.    Marland Kitchen aspirin EC 81 MG tablet Take 81 mg by mouth every morning.     . Calcium 200 MG TABS Take 200 mg by mouth 2 (two) times daily.    . cholecalciferol (VITAMIN D) 1000 units tablet Take 1,000 Units by mouth daily.    . metoprolol tartrate (LOPRESSOR) 25 MG tablet Take 12.5 mg by mouth 2 (two) times daily.     . Omega 3 1200 MG CAPS Take 1,200 mg by mouth See admin instructions. Takes 1 capsule every morning. Takes an additional capsule as needed for dry eyes.    Vladimir Faster Glycol-Propyl Glycol (SYSTANE OP) Place 1 drop into both eyes daily as needed (dry eyes).     . vitamin B-12 (CYANOCOBALAMIN) 1000 MCG tablet Take 1,000 mcg by mouth daily.    Marland Kitchen telmisartan (MICARDIS) 80 MG tablet Take 1 tablet by mouth daily.      No current facility-administered medications for this visit.     Allergies  Allergen Reactions  . Codeine     NOT SURE WHAT THE REACTION WAS  . Darvocet [Propoxyphene N-Acetaminophen] Other (See Comments)    Hallucination   . Percocet [Oxycodone-Acetaminophen] Other (See Comments)    Hallucinations   . Epinephrine Palpitations and Other (See Comments)    Severe increase in heart rate (has pacemaker)    Past Medical History:  Diagnosis Date  . Arthritis    HANDS  . Bradycardia   . Colostomy in place [facility])    S/P COLON RESECTION AND COLOSTOMY 08/27/13 FOR COLON PERFORATION  . DVT (deep venous thrombosis) ([place]) 2010  . Dysrhythmia    HX OF PAROXYSMAL ATRIAL FIB- TAKES ASPIRIN FOR BLOOD THINNER  . GERD (gastroesophageal reflux disease)   . Hypertension   . Peripheral vascular disease ([place])    Blood clot behind Right knee  . Presence of permanent cardiac pacemaker    COMPLETE HEART BLOCK--DR. CROITORU   . Syncope    permanent pacemaker 2008    Social History   Socioeconomic History  . Marital status: Widowed    Spouse name: Not on file  . Number of children: Not on file  .  Years of education: Not on file  . Highest education level: Not on file  Social Needs  . Financial resource strain: Not on file  . Food insecurity - worry: Not on file  . Food insecurity - inability: Not on file  . Transportation needs - medical: Not on file  . Transportation needs - non-medical: Not on file  Occupational History  . Not on file  Tobacco Use  . Smoking status: Never Smoker  . Smokeless tobacco: Never Used  Substance and Sexual Activity  . Alcohol use: No  . Drug use: No  . Sexual activity: No  Other Topics Concern  . Not on file  Social History Narrative  . Not on file     Family History  Problem Relation Age of Onset  . Heart disease Mother   . Heart disease Father      Review of Systems: General: negative for chills, fever, night sweats or weight changes.    Cardiovascular: negative for chest pain, dyspnea on exertion, edema, orthopnea, palpitations, paroxysmal nocturnal dyspnea or shortness of breath Dermatological: negative for rash Respiratory: negative for cough or wheezing Urologic: negative for hematuria Abdominal: negative for nausea, vomiting, diarrhea, bright red blood per rectum, melena, or hematemesis Neurologic: negative for visual changes, syncope, or dizziness All other systems reviewed and are otherwise negative except as noted above.    Blood pressure (!) 156/79, pulse 75, height 5\' 4"  (1.626 m), weight 116 lb (52.6 kg).  General appearance: alert, cooperative, no distress and thin Lungs: clear to auscultation bilaterally Heart: regular rate and rhythm Extremities: dressing on Rt lower leg Skin: Skin color, texture, turgor normal. No rashes or lesions Neurologic: Grossly normal  EKG Paced  ASSESSMENT AND PLAN:   Paroxysmal atrial fibrillation ([place]) H/O PAF, pt declined anticoagulation  Pacemaker - dual-chamber Medtronic Initial implantation 2000, generator change out in 2008, March 2017- MDT Pacemaker dependent   HTN (hypertension) Controlled   PLAN  I'll discuss cardiac clearance with Dr Sallyanne Kuster- she is pacemaker dependent. OK to hold ASA 5 days pre op if needed, resume ASAP. She tells me Dr Marla Roe wants to wit till her leg is completely healed.   She tells me she gets antibiotics prior to dental work but I could see no need for that based on the guidelines.  F/U Dr Sallyanne Kuster in 6 months.   Kerin Ransom PA-C 07/04/2017 9:50 AM

## 2017-07-19 ENCOUNTER — Telehealth: Payer: Self-pay

## 2017-07-19 NOTE — Telephone Encounter (Signed)
Called patient and she stated next week she has next week with Dr Marla Roe to review meds. Gave patient our fax number because the surgeon will probably need clearance for surgery. Patient voiced understanding and took down our fax number.

## 2017-07-19 NOTE — Telephone Encounter (Signed)
-----   Message from Erlene Quan, [place] sent at 07/04/2017  4:55 PM EST ----- Please ask this pt to notify us when her breast surgery will be so we can arrange for the Medtronic rep to be there to reprogram her pacemaker.  Kerin Ransom PA-C 07/04/2017 4:57 PM

## 2017-07-31 ENCOUNTER — Encounter: Payer: Self-pay | Admitting: Cardiovascular Disease

## 2017-07-31 NOTE — Progress Notes (Signed)
Incidental note made of a recent episode of paroxysmal atrial fibrillation on January 13, lasting for several hours.  This was detected when the pacemaker was reprogrammed DOO for elective breast surgery.  Similar infrequent but lengthy episodes of paroxysmal atrial fibrillation have been documented on an every other year basis.  Anticoagulation has been recommended, but declined by the patient.  Will discuss with her at her next appointment.  Sanda Klein, MD, [facility] [phone] office [phone] pager

## 2017-08-06 ENCOUNTER — Telehealth: Payer: Self-pay | Admitting: Cardiovascular Disease

## 2017-08-06 NOTE — Telephone Encounter (Signed)
Mrs. Kathryn [name] is calling because her surgery has been cancel and she is to let the technician know so that she/he will not need to come to program her pacemaker for the surgery . Thanks

## 2017-08-06 NOTE — Telephone Encounter (Signed)
Spoke to [employer] rep they are aware Patient aware

## 2017-08-22 ENCOUNTER — Encounter (HOSPITAL_BASED_OUTPATIENT_CLINIC_OR_DEPARTMENT_OTHER): Payer: Self-pay | Admitting: *Deleted

## 2017-08-22 NOTE — Progress Notes (Signed)
PMH and Cardiology notes reviewed by Dr.Caringan. Ok for surgery here as long as everything can be arranged with  pacemaker vendor.  Pre op slip faxed to Dr. Sallyanne Kuster for completion.

## 2017-08-27 ENCOUNTER — Ambulatory Visit: Payer: Self-pay | Admitting: Plastic Surgery

## 2017-08-27 DIAGNOSIS — T8543XA Leakage of breast prosthesis and implant, initial encounter: Secondary | ICD-10-CM

## 2017-08-27 NOTE — H&P (View-Only) (Signed)
Kathryn [name] is an 84 y.o. female.   Chief Complaint: ruptured breast implants HPI: The patient is a 84 y.o. yrs old wf here for further discussion about her breasts.  She has ruptured silicone implants.  She is wanting to have them removed and replaced.  We have had to cancel the surgery twice for various reasons.  Her mammogram from 05/2017 was negative for malignancy and showed evidence of extracapsular rupture of the left silicone implant.  There is a significant amount of discomfort.  She realizes we need to get the implants out and wait to do the replacement.  History: Her implants were placed by an inframammary incision in ~1982.  She had a mammogram ~ 3 years ago and it was negative.  Last year she had a pacemaker placed.  She noticed that the left breast started to appear larger, tender and malformed.  She realized the implant had likely ruptured. She was caring for her husband who passed away last year so was not able to seek care for her self till now. She is not able to have an MRI due to the pacemaker.   She is 5 feet 4 inches tall, weight = 117 pounds and preop bra = 34 B.  She was nearly an A cup prior to the implant placement and does not want to be that small again.  She would like the implants removed and new ones placed with a mastopexy.  She has not had any recent illnesses. The has marked distortion in all areas with excess fullness in the superior lateral area.  It is significantly larger than the right side.  Both implants feel ruptured.    Past Medical History:  Diagnosis Date  . Arthritis    HANDS  . Bradycardia   . DVT (deep venous thrombosis) ([place]) 2010  . Dysrhythmia    HX OF PAROXYSMAL ATRIAL FIB- TAKES ASPIRIN FOR BLOOD THINNER  . GERD (gastroesophageal reflux disease)   . Hypertension   . Peripheral vascular disease ([place])    Blood clot behind Right knee  . Presence of permanent cardiac pacemaker    COMPLETE HEART BLOCK--DR. CROITORU     Past Surgical History:   Procedure Laterality Date  . ABDOMINAL HYSTERECTOMY    . APPENDECTOMY    . AUGMENTATION MAMMAPLASTY Bilateral   . BREAST ENHANCEMENT SURGERY    . CATARACT EXTRACTION, BILATERAL    . CHOLECYSTECTOMY    . COLONOSCOPY N/A 03/05/2014   Procedure: COLONOSCOPY;  Surgeon: Shann Medal, MD;  Location: [facility];  Service: General;  Laterality: N/A;  . COLOSTOMY CLOSURE N/A 05/21/2014   Procedure: LAPAROSCOPIC ASSISTED CLOSURE OF COLOSTOMY AND REPAIR OF TRANSVERSE COLON AND ENTEROTOMY;  Surgeon: Alphonsa Overall, MD;  Location: [facility];  Service: General;  Laterality: N/A;  . EP IMPLANTABLE DEVICE N/A 10/04/2015   Procedure: PPM/BIV PPM Generator Changeout;  Surgeon: Sanda Klein, MD;  Location: [facility];  Service: Cardiovascular;  Laterality: N/A;  . LAPAROTOMY N/A 08/27/2013   Procedure: LAPAROSCOPY CONVERTED TO OPEN LAPAROTOMY WITH SIGMOID COLECTOMY AND COLOSTOMY;  Surgeon: Shann Medal, MD;  Location: [facility];  Service: General;  Laterality: N/A;  . OPEN REDUCTION INTERNAL FIXATION (ORIF) DISTAL RADIAL FRACTURE Left 02/15/2015   Procedure: OPEN TREATMENT OF LEFT DISTAL RADIUS FRACTURE;  Surgeon: Milly Jakob, MD;  Location: [facility];  Service: Orthopedics;  Laterality: Left;  . PACEMAKER INSERTION  12/02/1998   medtronic  . PERMANENT PACEMAKER GENERATOR CHANGE  02/28/2007  Medtronic Adapta  . TONSILLECTOMY    . US ECHOCARDIOGRAPHY  10/09/2011   mild LAE, mild MR,mild to mod TR    Family History  Problem Relation Age of Onset  . Heart disease Mother   . Heart disease Father    Social History:  reports that  has never smoked. she has never used smokeless tobacco. She reports that she does not drink alcohol or use drugs.  Allergies:  Allergies  Allergen Reactions  . Codeine     NOT SURE WHAT THE REACTION WAS  . Darvocet [Propoxyphene N-Acetaminophen] Other (See Comments)    Hallucination   . Percocet [Oxycodone-Acetaminophen] Other (See Comments)     Hallucinations   . Epinephrine Palpitations and Other (See Comments)    Severe increase in heart rate (has pacemaker)     (Not in a hospital admission)  No results found for this or any previous visit (from the past 48 hour(s)). No results found.  Review of Systems  Constitutional: Negative.   HENT: Negative.   Eyes: Negative.   Respiratory: Negative.   Cardiovascular: Negative.   Gastrointestinal: Negative.   Genitourinary: Negative.   Musculoskeletal: Negative.   Skin: Negative.   Neurological: Negative.   Psychiatric/Behavioral: Negative.     There were no vitals taken for this visit. Physical Exam  Constitutional: She is oriented to person, place, and time. She appears well-developed and well-nourished.  HENT:  Head: Normocephalic and atraumatic.  Eyes: Pupils are equal, round, and reactive to light.  Cardiovascular: Normal rate.  Respiratory: Effort normal. No respiratory distress.  GI: Soft. She exhibits no distension.  Neurological: She is alert and oriented to person, place, and time.  Skin: Skin is warm. No rash noted. No erythema.  Psychiatric: She has a normal mood and affect. Her behavior is normal. Judgment and thought content normal.     Assessment/Plan Excision of bilateral breast capsule contractures with excision of ruptured implants.  Mayes, DO 08/27/2017, 9:56 AM

## 2017-08-27 NOTE — H&P (Signed)
Kathryn [name] is an 84 y.o. female.   Chief Complaint: ruptured breast implants HPI: The patient is a 84 y.o. yrs old wf here for further discussion about her breasts.  She has ruptured silicone implants.  She is wanting to have them removed and replaced.  We have had to cancel the surgery twice for various reasons.  Her mammogram from 05/2017 was negative for malignancy and showed evidence of extracapsular rupture of the left silicone implant.  There is a significant amount of discomfort.  She realizes we need to get the implants out and wait to do the replacement.  History: Her implants were placed by an inframammary incision in ~1982.  She had a mammogram ~ 3 years ago and it was negative.  Last year she had a pacemaker placed.  She noticed that the left breast started to appear larger, tender and malformed.  She realized the implant had likely ruptured. She was caring for her husband who passed away last year so was not able to seek care for her self till now. She is not able to have an MRI due to the pacemaker.   She is 5 feet 4 inches tall, weight = 117 pounds and preop bra = 34 B.  She was nearly an A cup prior to the implant placement and does not want to be that small again.  She would like the implants removed and new ones placed with a mastopexy.  She has not had any recent illnesses. The has marked distortion in all areas with excess fullness in the superior lateral area.  It is significantly larger than the right side.  Both implants feel ruptured.    Past Medical History:  Diagnosis Date  . Arthritis    HANDS  . Bradycardia   . DVT (deep venous thrombosis) ([place]) 2010  . Dysrhythmia    HX OF PAROXYSMAL ATRIAL FIB- TAKES ASPIRIN FOR BLOOD THINNER  . GERD (gastroesophageal reflux disease)   . Hypertension   . Peripheral vascular disease ([place])    Blood clot behind Right knee  . Presence of permanent cardiac pacemaker    COMPLETE HEART BLOCK--DR. CROITORU     Past Surgical History:   Procedure Laterality Date  . ABDOMINAL HYSTERECTOMY    . APPENDECTOMY    . AUGMENTATION MAMMAPLASTY Bilateral   . BREAST ENHANCEMENT SURGERY    . CATARACT EXTRACTION, BILATERAL    . CHOLECYSTECTOMY    . COLONOSCOPY N/A 03/05/2014   Procedure: COLONOSCOPY;  Surgeon: Shann Medal, MD;  Location: [facility];  Service: General;  Laterality: N/A;  . COLOSTOMY CLOSURE N/A 05/21/2014   Procedure: LAPAROSCOPIC ASSISTED CLOSURE OF COLOSTOMY AND REPAIR OF TRANSVERSE COLON AND ENTEROTOMY;  Surgeon: Alphonsa Overall, MD;  Location: [facility];  Service: General;  Laterality: N/A;  . EP IMPLANTABLE DEVICE N/A 10/04/2015   Procedure: PPM/BIV PPM Generator Changeout;  Surgeon: Sanda Klein, MD;  Location: [facility];  Service: Cardiovascular;  Laterality: N/A;  . LAPAROTOMY N/A 08/27/2013   Procedure: LAPAROSCOPY CONVERTED TO OPEN LAPAROTOMY WITH SIGMOID COLECTOMY AND COLOSTOMY;  Surgeon: Shann Medal, MD;  Location: [facility];  Service: General;  Laterality: N/A;  . OPEN REDUCTION INTERNAL FIXATION (ORIF) DISTAL RADIAL FRACTURE Left 02/15/2015   Procedure: OPEN TREATMENT OF LEFT DISTAL RADIUS FRACTURE;  Surgeon: Milly Jakob, MD;  Location: [facility];  Service: Orthopedics;  Laterality: Left;  . PACEMAKER INSERTION  12/02/1998   medtronic  . PERMANENT PACEMAKER GENERATOR CHANGE  02/28/2007  Medtronic Adapta  . TONSILLECTOMY    . US ECHOCARDIOGRAPHY  10/09/2011   mild LAE, mild MR,mild to mod TR    Family History  Problem Relation Age of Onset  . Heart disease Mother   . Heart disease Father    Social History:  reports that  has never smoked. she has never used smokeless tobacco. She reports that she does not drink alcohol or use drugs.  Allergies:  Allergies  Allergen Reactions  . Codeine     NOT SURE WHAT THE REACTION WAS  . Darvocet [Propoxyphene N-Acetaminophen] Other (See Comments)    Hallucination   . Percocet [Oxycodone-Acetaminophen] Other (See Comments)     Hallucinations   . Epinephrine Palpitations and Other (See Comments)    Severe increase in heart rate (has pacemaker)     (Not in a hospital admission)  No results found for this or any previous visit (from the past 48 hour(s)). No results found.  Review of Systems  Constitutional: Negative.   HENT: Negative.   Eyes: Negative.   Respiratory: Negative.   Cardiovascular: Negative.   Gastrointestinal: Negative.   Genitourinary: Negative.   Musculoskeletal: Negative.   Skin: Negative.   Neurological: Negative.   Psychiatric/Behavioral: Negative.     There were no vitals taken for this visit. Physical Exam  Constitutional: She is oriented to person, place, and time. She appears well-developed and well-nourished.  HENT:  Head: Normocephalic and atraumatic.  Eyes: Pupils are equal, round, and reactive to light.  Cardiovascular: Normal rate.  Respiratory: Effort normal. No respiratory distress.  GI: Soft. She exhibits no distension.  Neurological: She is alert and oriented to person, place, and time.  Skin: Skin is warm. No rash noted. No erythema.  Psychiatric: She has a normal mood and affect. Her behavior is normal. Judgment and thought content normal.     Assessment/Plan Excision of bilateral breast capsule contractures with excision of ruptured implants.  [place], DO 08/27/2017, 9:56 AM

## 2017-08-28 ENCOUNTER — Ambulatory Visit (HOSPITAL_BASED_OUTPATIENT_CLINIC_OR_DEPARTMENT_OTHER): Payer: Medicare Other | Admitting: Anesthesiology

## 2017-08-28 ENCOUNTER — Other Ambulatory Visit: Payer: Self-pay

## 2017-08-28 ENCOUNTER — Ambulatory Visit (HOSPITAL_BASED_OUTPATIENT_CLINIC_OR_DEPARTMENT_OTHER)
Admission: RE | Admit: 2017-08-28 | Discharge: 2017-08-29 | Disposition: A | Discharge status: Home / Self Care | Payer: Medicare Other | Source: Ambulatory Visit | Attending: Plastic Surgery | Admitting: Plastic Surgery

## 2017-08-28 ENCOUNTER — Encounter (HOSPITAL_BASED_OUTPATIENT_CLINIC_OR_DEPARTMENT_OTHER): Payer: Self-pay | Admitting: Anesthesiology

## 2017-08-28 ENCOUNTER — Encounter (HOSPITAL_BASED_OUTPATIENT_CLINIC_OR_DEPARTMENT_OTHER)
Admission: RE | Disposition: A | Discharge status: Home / Self Care | Payer: Self-pay | Source: Ambulatory Visit | Attending: Plastic Surgery

## 2017-08-28 DIAGNOSIS — T8549XA Other mechanical complication of breast prosthesis and implant, initial encounter: Secondary | ICD-10-CM | POA: Diagnosis not present

## 2017-08-28 DIAGNOSIS — I739 Peripheral vascular disease, unspecified: Secondary | ICD-10-CM | POA: Insufficient documentation

## 2017-08-28 DIAGNOSIS — I10 Essential (primary) hypertension: Secondary | ICD-10-CM | POA: Diagnosis not present

## 2017-08-28 DIAGNOSIS — Z950 Presence of cardiac pacemaker: Secondary | ICD-10-CM | POA: Diagnosis not present

## 2017-08-28 DIAGNOSIS — Z86718 Personal history of other venous thrombosis and embolism: Secondary | ICD-10-CM | POA: Insufficient documentation

## 2017-08-28 DIAGNOSIS — Y838 Other surgical procedures as the cause of abnormal reaction of the patient, or of later complication, without mention of misadventure at the time of the procedure: Secondary | ICD-10-CM | POA: Insufficient documentation

## 2017-08-28 DIAGNOSIS — T8543XA Leakage of breast prosthesis and implant, initial encounter: Secondary | ICD-10-CM | POA: Diagnosis present

## 2017-08-28 HISTORY — PX: BREAST IMPLANT REMOVAL: SHX5361

## 2017-08-28 SURGERY — REMOVAL, IMPLANT, BREAST
Anesthesia: General | Laterality: Bilateral

## 2017-08-28 MED ORDER — FENTANYL CITRATE (PF) 100 MCG/2ML IJ SOLN: 50.0000 ug | INTRAMUSCULAR | Status: DC | PRN

## 2017-08-28 MED ORDER — CEFAZOLIN SODIUM-DEXTROSE 2-4 GM/100ML-% IV SOLN: 2.0000 g | INTRAVENOUS | Status: DC

## 2017-08-28 MED ORDER — CEFAZOLIN SODIUM-DEXTROSE 2-4 GM/100ML-% IV SOLN
2.0000 g | Freq: Three times a day (TID) | INTRAVENOUS | Status: DC
  Administered 2017-08-28 – 2017-08-29 (×2): 2 g via INTRAVENOUS
  Filled 2017-08-28 (×2): qty 100

## 2017-08-28 MED ORDER — LIDOCAINE HCL (CARDIAC) 20 MG/ML IV SOLN
INTRAVENOUS | Status: DC | PRN
  Administered 2017-08-28: 30 mg via INTRAVENOUS

## 2017-08-28 MED ORDER — CEFAZOLIN SODIUM-DEXTROSE 2-4 GM/100ML-% IV SOLN
INTRAVENOUS | Status: AC
  Filled 2017-08-28: qty 100

## 2017-08-28 MED ORDER — ONDANSETRON 4 MG PO TBDP: 4.0000 mg | ORAL_TABLET | Freq: Four times a day (QID) | ORAL | Status: DC | PRN

## 2017-08-28 MED ORDER — BUPIVACAINE-EPINEPHRINE 0.25% -1:200000 IJ SOLN
INTRAMUSCULAR | Status: DC | PRN
  Administered 2017-08-28: 20 mL

## 2017-08-28 MED ORDER — FENTANYL CITRATE (PF) 100 MCG/2ML IJ SOLN: 25.0000 ug | INTRAMUSCULAR | Status: DC | PRN

## 2017-08-28 MED ORDER — DEXAMETHASONE SODIUM PHOSPHATE 4 MG/ML IJ SOLN
INTRAMUSCULAR | Status: DC | PRN
  Administered 2017-08-28: 10 mg via INTRAVENOUS

## 2017-08-28 MED ORDER — FENTANYL CITRATE (PF) 100 MCG/2ML IJ SOLN
INTRAMUSCULAR | Status: DC | PRN
  Administered 2017-08-28 (×3): 50 ug via INTRAVENOUS

## 2017-08-28 MED ORDER — LACTATED RINGERS IV SOLN
INTRAVENOUS | Status: DC
  Administered 2017-08-28 (×2): via INTRAVENOUS

## 2017-08-28 MED ORDER — DEXAMETHASONE SODIUM PHOSPHATE 10 MG/ML IJ SOLN
INTRAMUSCULAR | Status: AC
  Filled 2017-08-28: qty 1

## 2017-08-28 MED ORDER — ONDANSETRON HCL 4 MG/2ML IJ SOLN
INTRAMUSCULAR | Status: AC
  Filled 2017-08-28: qty 2

## 2017-08-28 MED ORDER — LIDOCAINE 2% (20 MG/ML) 5 ML SYRINGE
INTRAMUSCULAR | Status: AC
  Filled 2017-08-28: qty 5

## 2017-08-28 MED ORDER — ONDANSETRON HCL 4 MG/2ML IJ SOLN: 4.0000 mg | Freq: Four times a day (QID) | INTRAMUSCULAR | Status: DC | PRN

## 2017-08-28 MED ORDER — NAPROXEN 500 MG PO TABS: 500.0000 mg | ORAL_TABLET | Freq: Two times a day (BID) | ORAL | Status: DC | PRN

## 2017-08-28 MED ORDER — ACETAMINOPHEN 10 MG/ML IV SOLN: 1000.0000 mg | Freq: Once | INTRAVENOUS | Status: DC | PRN

## 2017-08-28 MED ORDER — PROPOFOL 10 MG/ML IV BOLUS
INTRAVENOUS | Status: DC | PRN
  Administered 2017-08-28: 100 mg via INTRAVENOUS
  Administered 2017-08-28: 50 mg via INTRAVENOUS

## 2017-08-28 MED ORDER — SENNA 8.6 MG PO TABS
1.0000 | ORAL_TABLET | Freq: Two times a day (BID) | ORAL | Status: DC
  Administered 2017-08-28: 8.6 mg via ORAL
  Filled 2017-08-28: qty 1

## 2017-08-28 MED ORDER — SODIUM CHLORIDE 0.9 % IV SOLN
INTRAVENOUS | Status: DC | PRN
  Administered 2017-08-28 (×2): 500 mL

## 2017-08-28 MED ORDER — PHENYLEPHRINE HCL 10 MG/ML IJ SOLN
INTRAMUSCULAR | Status: DC | PRN
  Administered 2017-08-28: 80 ug via INTRAVENOUS

## 2017-08-28 MED ORDER — CEFAZOLIN SODIUM-DEXTROSE 2-3 GM-%(50ML) IV SOLR
INTRAVENOUS | Status: DC | PRN
  Administered 2017-08-28: 2 g via INTRAVENOUS

## 2017-08-28 MED ORDER — SODIUM CHLORIDE 0.9 % IJ SOLN
INTRAMUSCULAR | Status: AC
  Filled 2017-08-28: qty 10

## 2017-08-28 MED ORDER — KCL IN DEXTROSE-NACL 20-5-0.45 MEQ/L-%-% IV SOLN
INTRAVENOUS | Status: DC
  Administered 2017-08-28: 14:00:00 via INTRAVENOUS
  Filled 2017-08-28: qty 1000

## 2017-08-28 MED ORDER — ONDANSETRON HCL 4 MG/2ML IJ SOLN
INTRAMUSCULAR | Status: DC | PRN
  Administered 2017-08-28: 4 mg via INTRAVENOUS

## 2017-08-28 MED ORDER — MIDAZOLAM HCL 2 MG/2ML IJ SOLN: 1.0000 mg | INTRAMUSCULAR | Status: DC | PRN

## 2017-08-28 MED ORDER — FENTANYL CITRATE (PF) 100 MCG/2ML IJ SOLN
INTRAMUSCULAR | Status: AC
  Filled 2017-08-28: qty 2

## 2017-08-28 MED ORDER — MORPHINE SULFATE (PF) 2 MG/ML IV SOLN: 2.0000 mg | INTRAVENOUS | Status: DC | PRN

## 2017-08-28 MED ORDER — POLYETHYL GLYCOL-PROPYL GLYCOL 0.4-0.3 % OP GEL: Freq: Every day | OPHTHALMIC | Status: DC | PRN

## 2017-08-28 MED ORDER — SCOPOLAMINE 1 MG/3DAYS TD PT72: 1.0000 | MEDICATED_PATCH | Freq: Once | TRANSDERMAL | Status: DC | PRN

## 2017-08-28 MED ORDER — DIPHENHYDRAMINE HCL 12.5 MG/5ML PO ELIX: 12.5000 mg | ORAL_SOLUTION | Freq: Four times a day (QID) | ORAL | Status: DC | PRN

## 2017-08-28 MED ORDER — DIPHENHYDRAMINE HCL 50 MG/ML IJ SOLN: 12.5000 mg | Freq: Four times a day (QID) | INTRAMUSCULAR | Status: DC | PRN

## 2017-08-28 MED ORDER — METOPROLOL TARTRATE 12.5 MG HALF TABLET: 12.5000 mg | ORAL_TABLET | Freq: Two times a day (BID) | ORAL | Status: DC

## 2017-08-28 MED ORDER — IRBESARTAN 75 MG PO TABS: 37.5000 mg | ORAL_TABLET | Freq: Every day | ORAL | Status: DC

## 2017-08-28 MED ORDER — PROMETHAZINE HCL 25 MG/ML IJ SOLN
6.2500 mg | INTRAMUSCULAR | Status: DC | PRN
Start: 2017-08-28 — End: 2017-08-28

## 2017-08-28 MED ORDER — PROPOFOL 10 MG/ML IV BOLUS
INTRAVENOUS | Status: AC
  Filled 2017-08-28: qty 20

## 2017-08-28 MED ORDER — CEFAZOLIN SODIUM 1 G IJ SOLR
INTRAMUSCULAR | Status: AC
  Filled 2017-08-28: qty 10

## 2017-08-28 SURGICAL SUPPLY — 72 items
BAG DECANTER FOR FLEXI CONT (MISCELLANEOUS) ×6 IMPLANT
BINDER BREAST LRG (GAUZE/BANDAGES/DRESSINGS) IMPLANT
BINDER BREAST MEDIUM (GAUZE/BANDAGES/DRESSINGS) ×3 IMPLANT
BINDER BREAST XLRG (GAUZE/BANDAGES/DRESSINGS) IMPLANT
BINDER BREAST XXLRG (GAUZE/BANDAGES/DRESSINGS) IMPLANT
BIOPATCH RED 1 DISK 7.0 (GAUZE/BANDAGES/DRESSINGS) ×4 IMPLANT
BIOPATCH RED 1IN DISK 7.0MM (GAUZE/BANDAGES/DRESSINGS) ×2
BLADE HEX COATED 2.75 (ELECTRODE) ×3 IMPLANT
BLADE SURG 15 STRL LF DISP TIS (BLADE) ×2 IMPLANT
BLADE SURG 15 STRL SS (BLADE) ×4
BNDG GAUZE ELAST 4 BULKY (GAUZE/BANDAGES/DRESSINGS) IMPLANT
CANISTER SUCT 1200ML W/VALVE (MISCELLANEOUS) ×3 IMPLANT
CHLORAPREP W/TINT 26ML (MISCELLANEOUS) ×3 IMPLANT
COVER BACK TABLE 60X90IN (DRAPES) ×3 IMPLANT
COVER MAYO STAND STRL (DRAPES) ×3 IMPLANT
DECANTER SPIKE VIAL GLASS SM (MISCELLANEOUS) IMPLANT
DERMABOND ADVANCED (GAUZE/BANDAGES/DRESSINGS) ×4
DERMABOND ADVANCED .7 DNX12 (GAUZE/BANDAGES/DRESSINGS) ×2 IMPLANT
DRAIN CHANNEL 15F RND FF W/TCR (WOUND CARE) ×6 IMPLANT
DRAIN CHANNEL 19F RND (DRAIN) IMPLANT
DRAPE LAPAROSCOPIC ABDOMINAL (DRAPES) ×3 IMPLANT
DRSG PAD ABDOMINAL 8X10 ST (GAUZE/BANDAGES/DRESSINGS) ×6 IMPLANT
ELECT BLADE 4.0 EZ CLEAN MEGAD (MISCELLANEOUS)
ELECT BLADE 6.5 .24CM SHAFT (ELECTRODE) IMPLANT
ELECT REM PT RETURN 9FT ADLT (ELECTROSURGICAL) ×3
ELECTRODE BLDE 4.0 EZ CLN MEGD (MISCELLANEOUS) IMPLANT
ELECTRODE REM PT RTRN 9FT ADLT (ELECTROSURGICAL) ×1 IMPLANT
EVACUATOR SILICONE 100CC (DRAIN) ×6 IMPLANT
GLOVE BIO SURGEON STRL SZ 6.5 (GLOVE) ×6 IMPLANT
GLOVE BIO SURGEON STRL SZ7 (GLOVE) ×3 IMPLANT
GLOVE BIO SURGEONS STRL SZ 6.5 (GLOVE) ×3
GLOVE BIOGEL PI IND STRL 7.0 (GLOVE) ×1 IMPLANT
GLOVE BIOGEL PI INDICATOR 7.0 (GLOVE) ×2
GOWN STRL REUS W/ TWL LRG LVL3 (GOWN DISPOSABLE) ×1 IMPLANT
GOWN STRL REUS W/ TWL XL LVL3 (GOWN DISPOSABLE) ×1 IMPLANT
GOWN STRL REUS W/TWL LRG LVL3 (GOWN DISPOSABLE) ×2
GOWN STRL REUS W/TWL XL LVL3 (GOWN DISPOSABLE) ×2
IV NS 1000ML (IV SOLUTION)
IV NS 1000ML BAXH (IV SOLUTION) IMPLANT
IV NS 500ML (IV SOLUTION)
IV NS 500ML BAXH (IV SOLUTION) IMPLANT
KIT FILL SYSTEM UNIVERSAL (SET/KITS/TRAYS/PACK) IMPLANT
NDL SAFETY ECLIPSE 18X1.5 (NEEDLE) IMPLANT
NEEDLE HYPO 18GX1.5 SHARP (NEEDLE)
NEEDLE HYPO 25X1 1.5 SAFETY (NEEDLE) ×3 IMPLANT
NS IRRIG 1000ML POUR BTL (IV SOLUTION) IMPLANT
PACK BASIN DAY SURGERY FS (CUSTOM PROCEDURE TRAY) ×3 IMPLANT
PENCIL BUTTON HOLSTER BLD 10FT (ELECTRODE) ×3 IMPLANT
PIN SAFETY STERILE (MISCELLANEOUS) IMPLANT
SLEEVE SCD COMPRESS KNEE MED (MISCELLANEOUS) ×3 IMPLANT
SPONGE LAP 18X18 X RAY DECT (DISPOSABLE) ×6 IMPLANT
SUT MNCRL AB 4-0 PS2 18 (SUTURE) ×6 IMPLANT
SUT MON AB 3-0 SH 27 (SUTURE) ×4
SUT MON AB 3-0 SH27 (SUTURE) ×2 IMPLANT
SUT MON AB 5-0 PS2 18 (SUTURE) ×6 IMPLANT
SUT PDS AB 2-0 CT2 27 (SUTURE) IMPLANT
SUT PROLENE 3 0 PS 2 (SUTURE) IMPLANT
SUT SILK 3 0 PS 1 (SUTURE) IMPLANT
SUT SILK 4 0 PS 2 (SUTURE) ×6 IMPLANT
SUT VIC AB 3-0 SH 27 (SUTURE)
SUT VIC AB 3-0 SH 27X BRD (SUTURE) IMPLANT
SUT VICRYL 4-0 PS2 18IN ABS (SUTURE) IMPLANT
SWAB COLLECTION DEVICE MRSA (MISCELLANEOUS) IMPLANT
SWAB CULTURE ESWAB REG 1ML (MISCELLANEOUS) IMPLANT
SYR 50ML LL SCALE MARK (SYRINGE) IMPLANT
SYR BULB IRRIGATION 50ML (SYRINGE) ×3 IMPLANT
SYR CONTROL 10ML LL (SYRINGE) ×3 IMPLANT
TOWEL OR 17X24 6PK STRL BLUE (TOWEL DISPOSABLE) ×6 IMPLANT
TUBE CONNECTING 20'X1/4 (TUBING) ×1
TUBE CONNECTING 20X1/4 (TUBING) ×2 IMPLANT
UNDERPAD 30X30 (UNDERPADS AND DIAPERS) ×6 IMPLANT
YANKAUER SUCT BULB TIP NO VENT (SUCTIONS) ×3 IMPLANT

## 2017-08-28 NOTE — Anesthesia Procedure Notes (Signed)
Procedure Name: LMA Insertion Date/Time: 08/28/2017 11:16 AM Performed by: Marrianne Mood, CRNA Pre-anesthesia Checklist: Patient identified, Emergency Drugs available, Suction available, Patient being monitored and Timeout performed Patient Re-evaluated:Patient Re-evaluated prior to induction Oxygen Delivery Method: Circle system utilized Preoxygenation: Pre-oxygenation with 100% oxygen Induction Type: IV induction Ventilation: Mask ventilation without difficulty LMA: LMA inserted LMA Size: 4.0 Number of attempts: 1 Airway Equipment and Method: Bite block Placement Confirmation: positive ETCO2 Tube secured with: Tape Dental Injury: Teeth and Oropharynx as per pre-operative assessment

## 2017-08-28 NOTE — Op Note (Signed)
DATE OF OPERATION: 08/28/2017  LOCATION: [facility]  PREOPERATIVE DIAGNOSIS: Rupture bilateral breast silicone implant with capsule contracture  POSTOPERATIVE DIAGNOSIS: Same  PROCEDURE: Removal of bilateral ruptured breast implants with complete capsulectomy  SURGEON: Jhon Mallozzi Sanger Margaurite Salido, DO  DRAIN: 15 blake bilaterally  EBL: 20 cc  CONDITION: Stable  COMPLICATIONS: None  INDICATION: The patient, Kathryn [name], is a 84 y.o. female born on 06/18/1928, is here for treatment of bilateral ruptured silicone breast implants that seemed to be getting worse over time.  She also had capsule contracture and firmness of the breast tissue.   PROCEDURE DETAILS:  The patient was seen prior to surgery and marked.  The IV antibiotics were given. The patient was taken to the operating room and given a general anesthetic. A standard time out was performed and all information was confirmed by those in the room. SCDs were placed.  The skin was prepped and draped.  Local was injected at the previous incision site of each breast at the inframammary fold.    Right:  The #10 blade was used to make an incision at the previous incision site.  The bovie was used to dissect to the implant capsule.  The capsule was released from the surrounding soft tissue and breast fat.  Hemostasis was achieved with electrocautery.  The entire capsule and implant was removed from both the deep and superficial plans.  The implant was submuscular.  The pocket was irrigated with antibiotic solution. The implant was ruptured but within the capsule.  There was a dry clean pocket.  The #15 drain was placed and secured with the 4-0 Silk to the chest wall skin.  The deep layers were closed with the 3-0 Monocryl followed by the 4-0 and 5-0 Monocryl for the skin.  Derma bond was applied.    Left: The #10 blade was used to make an incision at the previous incision site.  The bovie was used to dissect to the implant  capsule.  The capsule was released from the surrounding soft tissue and breast fat.  Hemostasis was achieved with electrocautery.  The entire capsule and implant was removed from both the deep and superficial plans.  The implant was submuscular.  There was rupture of the implant within and outside the capsule.  There was a mass, may have been silicone, at the axilla.  This was excised and sent separately.  An additional portion of the capsule that was deep into the axilla was also excised and sent separately.  The entire contents of the silicone implant and capsule was sent to path as well.  The pocket was irrigated with antibiotic solution.  There was a dry clean pocket.  The #15 drain was placed and secured with the 4-0 Silk to the chest wall skin.  The deep layers were closed with the 3-0 Monocryl followed by the 4-0 and 5-0 Monocryl for the skin.  Derma bond was applied.  The patient was allowed to wake up and taken to recovery room in stable condition at the end of the case. The family was notified at the end of the case.

## 2017-08-28 NOTE — Anesthesia Preprocedure Evaluation (Signed)
Anesthesia Evaluation  Patient identified by MRN, date of birth, ID band Patient awake    Reviewed: Allergy & Precautions, NPO status , Patient's Chart, lab work & pertinent test results  Airway Mallampati: II  TM Distance: >3 FB Neck ROM: Full    Dental no notable dental hx.    Pulmonary neg pulmonary ROS,    Pulmonary exam normal breath sounds clear to auscultation       Cardiovascular hypertension, Normal cardiovascular exam+ dysrhythmias Atrial Fibrillation + pacemaker  Rhythm:Regular Rate:Normal     Neuro/Psych negative neurological ROS  negative psych ROS   GI/Hepatic Neg liver ROS, GERD  ,  Endo/Other  negative endocrine ROS  Renal/GU negative Renal ROS  negative genitourinary   Musculoskeletal negative musculoskeletal ROS (+)   Abdominal   Peds negative pediatric ROS (+)  Hematology negative hematology ROS (+)   Anesthesia Other Findings   Reproductive/Obstetrics negative OB ROS                             Anesthesia Physical Anesthesia Plan  ASA: III  Anesthesia Plan: General   Post-op Pain Management:    Induction: Intravenous  PONV Risk Score and Plan: 3 and Ondansetron, Dexamethasone and Treatment may vary due to age or medical condition  Airway Management Planned: LMA  Additional Equipment:   Intra-op Plan:   Post-operative Plan: Extubation in OR  Informed Consent: I have reviewed the patients History and Physical, chart, labs and discussed the procedure including the risks, benefits and alternatives for the proposed anesthesia with the patient or authorized representative who has indicated his/her understanding and acceptance.   Dental advisory given  Plan Discussed with: CRNA and Surgeon  Anesthesia Plan Comments:         Anesthesia Quick Evaluation

## 2017-08-28 NOTE — Progress Notes (Signed)
Dr. Sallyanne Kuster, called back again to verify that the [employer] rep had gone to the wrong facility but stated that they were on their way to our facility to see Kathryn [name]. Dr. Kalman Shan and Dr. Marla Roe have both been notified along with the Kathryn [name]. Will continue to monitor Kathryn [name].

## 2017-08-28 NOTE — Discharge Instructions (Signed)

## 2017-08-28 NOTE — Progress Notes (Signed)
Spoke personally with Dr. Sallyanne Kuster, the pts cardiologist and he stated that he would call the pacemaker or [employer] rep himself to make sure that someone was on their way to clear pt for surgery. Will continue to monitor.

## 2017-08-28 NOTE — Interval H&P Note (Signed)
History and Physical Interval Note:  08/28/2017 10:07 AM  Kathryn [name]  has presented today for surgery, with the diagnosis of RUPTURE OF BILATERAL BREAST IMPLANTS  The various methods of treatment have been discussed with the patient and family. After consideration of risks, benefits and other options for treatment, the patient has consented to  Procedure(s): REMOVAL BREAST IMPLANTS BILATERAL (Bilateral) as a surgical intervention .  The patient's history has been reviewed, patient examined, no change in status, stable for surgery.  I have reviewed the patient's chart and labs.  Questions were answered to the patient's satisfaction.     Loel Lofty Dillingham

## 2017-08-28 NOTE — Interval H&P Note (Signed)
History and Physical Interval Note:  08/28/2017 10:14 AM  Kathryn [name]  has presented today for surgery, with the diagnosis of RUPTURE OF BILATERAL BREAST IMPLANTS  The various methods of treatment have been discussed with the patient and family. After consideration of risks, benefits and other options for treatment, the patient has consented to  Procedure(s): REMOVAL BREAST IMPLANTS BILATERAL (Bilateral) as a surgical intervention .  The patient's history has been reviewed, patient examined, no change in status, stable for surgery.  I have reviewed the patient's chart and labs.  Questions were answered to the patient's satisfaction.     Loel Lofty Shye Doty

## 2017-08-28 NOTE — Transfer of Care (Signed)
Immediate Anesthesia Transfer of Care Note  Patient: Kathryn [name]  Procedure(s) Performed: REMOVAL BREAST IMPLANTS BILATERAL (Bilateral )  Patient Location: PACU  Anesthesia Type:General  Level of Consciousness: sedated  Airway & Oxygen Therapy: Patient Spontanous Breathing and Patient connected to face mask oxygen  Post-op Assessment: Report given to RN and Post -op Vital signs reviewed and stable  Post vital signs: Reviewed and stable  Last Vitals:  Vitals:   08/28/17 1302 08/28/17 1303  BP: 123/83   Pulse:  80  Resp:  16  Temp:    SpO2:  100%    Last Pain:  Vitals:   08/28/17 1017  TempSrc: Oral         Complications: No apparent anesthesia complications

## 2017-08-29 ENCOUNTER — Encounter (HOSPITAL_BASED_OUTPATIENT_CLINIC_OR_DEPARTMENT_OTHER): Payer: Self-pay | Admitting: Plastic Surgery

## 2017-08-29 DIAGNOSIS — T8549XA Other mechanical complication of breast prosthesis and implant, initial encounter: Secondary | ICD-10-CM | POA: Diagnosis not present

## 2017-08-29 NOTE — Anesthesia Postprocedure Evaluation (Signed)
Anesthesia Post Note  Patient: Kathryn [name]  Procedure(s) Performed: REMOVAL BREAST IMPLANTS BILATERAL (Bilateral )     Patient location during evaluation: PACU Anesthesia Type: General Level of consciousness: awake and alert Pain management: pain level controlled Vital Signs Assessment: post-procedure vital signs reviewed and stable Respiratory status: spontaneous breathing, nonlabored ventilation, respiratory function stable and patient connected to nasal cannula oxygen Cardiovascular status: blood pressure returned to baseline and stable Postop Assessment: no apparent nausea or vomiting Anesthetic complications: no    Last Vitals:  Vitals:   08/29/17 0735 08/29/17 0759  BP:  136/67  Pulse: 74 78  Resp:  16  Temp:  (!) 36.2 C  SpO2: 99% 98%    Last Pain:  Vitals:   08/29/17 0913  TempSrc:   PainSc: 0-No pain                 Saman Giddens S

## 2017-09-16 ENCOUNTER — Ambulatory Visit (INDEPENDENT_AMBULATORY_CARE_PROVIDER_SITE_OTHER): Payer: Medicare Other | Admitting: *Deleted

## 2017-09-16 DIAGNOSIS — I442 Atrioventricular block, complete: Secondary | ICD-10-CM | POA: Diagnosis not present

## 2017-09-16 NOTE — Progress Notes (Signed)
Remote pacemaker transmission.   

## 2017-09-18 ENCOUNTER — Encounter: Payer: Self-pay | Admitting: Cardiology

## 2017-09-19 LAB — CUP PACEART REMOTE DEVICE CHECK
Battery Impedance: 183 Ohm
Battery Voltage: 2.79 V
Brady Statistic AP VS Percent: 0 %
Brady Statistic AS VP Percent: 21 %
Brady Statistic AS VS Percent: 0 %
Implantable Lead Implant Date: 20000522
Implantable Lead Location: 753859
Implantable Pulse Generator Implant Date: 20170328
Lead Channel Impedance Value: 715 Ohm
Lead Channel Pacing Threshold Amplitude: 0.5 V
Lead Channel Pacing Threshold Pulse Width: 0.4 ms
Lead Channel Pacing Threshold Pulse Width: 0.4 ms
Lead Channel Setting Pacing Amplitude: 2 V
Lead Channel Setting Pacing Pulse Width: 0.4 ms
MDC IDC LEAD IMPLANT DT: 20000526
MDC IDC LEAD LOCATION: 753860
MDC IDC MSMT BATTERY REMAINING LONGEVITY: 122 mo
MDC IDC MSMT LEADCHNL RA IMPEDANCE VALUE: 425 Ohm
MDC IDC MSMT LEADCHNL RV PACING THRESHOLD AMPLITUDE: 0.75 V
MDC IDC SESS DTM: 20190311151343
MDC IDC SET LEADCHNL RA PACING AMPLITUDE: 1.5 V
MDC IDC SET LEADCHNL RV SENSING SENSITIVITY: 4 mV
MDC IDC STAT BRADY AP VP PERCENT: 79 %

## 2017-10-28 ENCOUNTER — Ambulatory Visit (HOSPITAL_COMMUNITY)
Admission: RE | Admit: 2017-10-28 | Discharge: 2017-10-28 | Disposition: A | Discharge status: Home / Self Care | Payer: Medicare Other | Source: Ambulatory Visit | Attending: Vascular Surgery | Admitting: Vascular Surgery

## 2017-10-28 ENCOUNTER — Encounter (HOSPITAL_COMMUNITY): Payer: Self-pay

## 2017-10-28 ENCOUNTER — Other Ambulatory Visit (HOSPITAL_COMMUNITY): Payer: Self-pay | Admitting: Internal Medicine

## 2017-10-28 DIAGNOSIS — I739 Peripheral vascular disease, unspecified: Secondary | ICD-10-CM

## 2017-12-16 ENCOUNTER — Ambulatory Visit (INDEPENDENT_AMBULATORY_CARE_PROVIDER_SITE_OTHER): Payer: Medicare Other | Admitting: *Deleted

## 2017-12-16 DIAGNOSIS — I442 Atrioventricular block, complete: Secondary | ICD-10-CM

## 2017-12-16 NOTE — Progress Notes (Signed)
Remote pacemaker transmission.   

## 2017-12-17 ENCOUNTER — Encounter: Payer: Self-pay | Admitting: Cardiology

## 2017-12-17 LAB — CUP PACEART REMOTE DEVICE CHECK
Battery Remaining Longevity: 120 mo
Battery Voltage: 2.8 V
Brady Statistic AS VP Percent: 17 %
Brady Statistic AS VS Percent: 0 %
Implantable Lead Implant Date: 20000526
Implantable Lead Location: 753859
Implantable Pulse Generator Implant Date: 20170328
Lead Channel Impedance Value: 721 Ohm
Lead Channel Pacing Threshold Amplitude: 0.5 V
Lead Channel Pacing Threshold Pulse Width: 0.4 ms
Lead Channel Setting Pacing Amplitude: 1.5 V
Lead Channel Setting Pacing Pulse Width: 0.4 ms
MDC IDC LEAD IMPLANT DT: 20000522
MDC IDC LEAD LOCATION: 753860
MDC IDC MSMT BATTERY IMPEDANCE: 183 Ohm
MDC IDC MSMT LEADCHNL RA IMPEDANCE VALUE: 413 Ohm
MDC IDC MSMT LEADCHNL RA PACING THRESHOLD PULSEWIDTH: 0.4 ms
MDC IDC MSMT LEADCHNL RV PACING THRESHOLD AMPLITUDE: 0.75 V
MDC IDC SESS DTM: 20190610141409
MDC IDC SET LEADCHNL RV PACING AMPLITUDE: 2 V
MDC IDC SET LEADCHNL RV SENSING SENSITIVITY: 4 mV
MDC IDC STAT BRADY AP VP PERCENT: 83 %
MDC IDC STAT BRADY AP VS PERCENT: 0 %

## 2018-01-13 ENCOUNTER — Ambulatory Visit: Payer: Medicare Other | Admitting: Cardiovascular Disease

## 2018-01-13 ENCOUNTER — Encounter: Payer: Self-pay | Admitting: Cardiovascular Disease

## 2018-01-13 VITALS — BP 168/82 | HR 82 | Ht 64.75 in | Wt 118.0 lb

## 2018-01-13 DIAGNOSIS — I480 Paroxysmal atrial fibrillation: Secondary | ICD-10-CM

## 2018-01-13 DIAGNOSIS — I10 Essential (primary) hypertension: Secondary | ICD-10-CM | POA: Diagnosis not present

## 2018-01-13 DIAGNOSIS — I442 Atrioventricular block, complete: Secondary | ICD-10-CM

## 2018-01-13 DIAGNOSIS — Z950 Presence of cardiac pacemaker: Secondary | ICD-10-CM | POA: Diagnosis not present

## 2018-01-13 NOTE — Patient Instructions (Signed)
Dr Croitoru recommends that you continue on your current medications as directed. Please refer to the Current Medication list given to you today.  Remote monitoring is used to monitor your Pacemaker or ICD from home. This monitoring reduces the number of office visits required to check your device to one time per year. It allows us to keep an eye on the functioning of your device to ensure it is working properly. You are scheduled for a device check from home on Monday, September 9th, 2019. You may send your transmission at any time that day. If you have a wireless device, the transmission will be sent automatically. After your physician reviews your transmission, you will receive a notification with your next transmission date.  To improve our patient care and to more adequately follow your device, [facility] has decided, as a practice, to start following each patient four times a year with your home monitor. This means that you may experience a remote appointment that is close to an in-office appointment with your physician. Your insurance will apply at the same rate as other remote monitoring transmissions.  Dr Croitoru recommends that you schedule a follow-up appointment in 12 months with a pacemaker check. You will receive a reminder letter in the mail two months in advance. If you don't receive a letter, please call our office to schedule the follow-up appointment.  If you need a refill on your cardiac medications before your next appointment, please call your pharmacy. 

## 2018-01-13 NOTE — Progress Notes (Signed)
Patient ID: Kathryn [name], female   DOB: 1928-01-09, 84 y.o.   MRN: [id number]    Cardiology Office Note    Date:  01/13/2018   ID:  Kathryn [name], DOB 03-Jan-1928, MRN [id number]  PCP:  Crist Infante, MD  Cardiologist:   Sanda Klein, MD   No chief complaint on file.   History of Present Illness:  Kathryn [name] is a 84 y.o. female with complete heart block and history of paroxysmal atrial fibrillation returning for evaluation of her pacemaker. She underwent a generator change out in 2017.   She underwent extraction of leaky silicone breast implants in February, but did not have reimplantation.  The anesthesiologist at the surgical center was very concerned about putting her through a longer procedure.  We did reprogram her device VOO for that surgery.  Remote pacemaker download about a month ago with normal results.  She has 100% ventricular pacing, 83% atrial pacing.  She has not had any atrial fibrillation.  In the past her pacemaker has recorded fairly lengthy episodes of atrial fibrillation, but none have been recorded since she had abdominal surgery in November 2015.  Estimated device longevity is about 10 years.  Heart rate histogram distribution is appropriate.  Lead parameters are excellent.  The patient specifically denies any chest pain at rest exertion, dyspnea at rest or with exertion, orthopnea, paroxysmal nocturnal dyspnea, syncope, palpitations, focal neurological deficits, intermittent claudication, lower extremity edema, unexplained weight gain, cough, hemoptysis or wheezing.  She is still underweight with a BMI just under 20.  Blood pressure is consistently high, the lowest she's seen as a systolic of about 937, blood pressure over 160.  Despite high embolic risk (CHADSVasc 4: age 31, gender, HTN) she has declined anticoagulation therapy in the past. She has never had an embolic event and does not have serious bleeding problems, although she does describe easy bruising  even when taking only aspirin.  As mentioned, she has not had atrial fibrillation since November 2015.  She has complete heart block and a dual-chamber Medtronic pacemaker which was implanted in 2000, with a generator change out in 2008. In 2000 she presented with what appeared to be an acute anterior wall myocardial infarction following a bradycardia-related ventricular fibrillation episode during treadmill stress testing as an outpatient. She underwent emergency cardiac catheterization and was found to have normal coronary arteries with a Takotsubo cardiomyopathy. She has treated systemic hypertension. She has infrequent but lengthy episodes of asymptomatic paroxysmal atrial fibrillation.  Past Medical History:  Diagnosis Date  . Arthritis    HANDS  . Bradycardia   . DVT (deep venous thrombosis) ([place]) 2010  . Dysrhythmia    HX OF PAROXYSMAL ATRIAL FIB- TAKES ASPIRIN FOR BLOOD THINNER  . GERD (gastroesophageal reflux disease)   . Hypertension   . Peripheral vascular disease ([place])    Blood clot behind Right knee  . Presence of permanent cardiac pacemaker    COMPLETE HEART BLOCK--DR. Davinia Riccardi     Past Surgical History:  Procedure Laterality Date  . ABDOMINAL HYSTERECTOMY    . APPENDECTOMY    . AUGMENTATION MAMMAPLASTY Bilateral   . BREAST ENHANCEMENT SURGERY    . BREAST IMPLANT REMOVAL Bilateral 08/28/2017   Procedure: REMOVAL BREAST IMPLANTS BILATERAL;  Surgeon: Wallace Going, DO;  Location: [facility];  Service: Plastics;  Laterality: Bilateral;  . CATARACT EXTRACTION, BILATERAL    . CHOLECYSTECTOMY    . COLONOSCOPY N/A 03/05/2014   Procedure: COLONOSCOPY;  Surgeon: Fenton Malling  Lucia Gaskins, MD;  Location: [facility];  Service: General;  Laterality: N/A;  . COLOSTOMY CLOSURE N/A 05/21/2014   Procedure: LAPAROSCOPIC ASSISTED CLOSURE OF COLOSTOMY AND REPAIR OF TRANSVERSE COLON AND ENTEROTOMY;  Surgeon: Alphonsa Overall, MD;  Location: [facility];  Service: General;  Laterality:  N/A;  . EP IMPLANTABLE DEVICE N/A 10/04/2015   Procedure: PPM/BIV PPM Generator Changeout;  Surgeon: Sanda Klein, MD;  Location: [facility];  Service: Cardiovascular;  Laterality: N/A;  . LAPAROTOMY N/A 08/27/2013   Procedure: LAPAROSCOPY CONVERTED TO OPEN LAPAROTOMY WITH SIGMOID COLECTOMY AND COLOSTOMY;  Surgeon: Shann Medal, MD;  Location: [facility];  Service: General;  Laterality: N/A;  . OPEN REDUCTION INTERNAL FIXATION (ORIF) DISTAL RADIAL FRACTURE Left 02/15/2015   Procedure: OPEN TREATMENT OF LEFT DISTAL RADIUS FRACTURE;  Surgeon: Milly Jakob, MD;  Location: [facility];  Service: Orthopedics;  Laterality: Left;  . PACEMAKER INSERTION  12/02/1998   medtronic  . PERMANENT PACEMAKER GENERATOR CHANGE  02/28/2007   Medtronic Adapta  . TONSILLECTOMY    . US ECHOCARDIOGRAPHY  10/09/2011   mild LAE, mild MR,mild to mod TR    Outpatient Medications Prior to Visit  Medication Sig Dispense Refill  . acetaminophen (TYLENOL) 500 MG tablet Take 1 tablet (500 mg total) by mouth every 6 (six) hours as needed. 50 tablet 0  . Ascorbic Acid (VITAMIN C PO) Take 1 tablet by mouth daily.    Marland Kitchen aspirin EC 81 MG tablet Take 81 mg by mouth every morning.     . Calcium 200 MG TABS Take 200 mg by mouth 2 (two) times daily.    . cholecalciferol (VITAMIN D) 1000 units tablet Take 1,000 Units by mouth daily.    . metoprolol tartrate (LOPRESSOR) 25 MG tablet Take 12.5 mg by mouth 2 (two) times daily.     Marland Kitchen olmesartan (BENICAR) 40 MG tablet Take 40 mg by mouth daily.    Vladimir Faster Glycol-Propyl Glycol (SYSTANE OP) Place 1 drop into both eyes daily as needed (dry eyes).     . vitamin B-12 (CYANOCOBALAMIN) 1000 MCG tablet Take 1,000 mcg by mouth daily.     No facility-administered medications prior to visit.      Allergies:   Codeine; Darvocet [propoxyphene n-acetaminophen]; Percocet [oxycodone-acetaminophen]; and Epinephrine   Social History   Socioeconomic History  . Marital status:  Widowed    Spouse name: Not on file  . Number of children: Not on file  . Years of education: Not on file  . Highest education level: Not on file  Occupational History  . Not on file  Social Needs  . Financial resource strain: Not on file  . Food insecurity:    Worry: Not on file    Inability: Not on file  . Transportation needs:    Medical: Not on file    Non-medical: Not on file  Tobacco Use  . Smoking status: Never Smoker  . Smokeless tobacco: Never Used  Substance and Sexual Activity  . Alcohol use: No  . Drug use: No  . Sexual activity: Never  Lifestyle  . Physical activity:    Days per week: Not on file    Minutes per session: Not on file  . Stress: Not on file  Relationships  . Social connections:    Talks on phone: Not on file    Gets together: Not on file    Attends religious service: Not on file    Active member of club or organization: Not  on file    Attends meetings of clubs or organizations: Not on file    Relationship status: Not on file  Other Topics Concern  . Not on file  Social History Narrative  . Not on file     Family History:  The patient's family history includes Heart disease in her father and mother.   ROS:   Please see the history of present illness.    ROS All other systems reviewed and are negative.   PHYSICAL EXAM:   VS:  BP (!) 168/82   Pulse 82   Ht 5' 4.75" (1.645 m)   Wt 118 lb (53.5 kg)   BMI 19.79 kg/m      General: Alert, oriented x3, no distress, very lean.  Healthy left subclavian pacemaker site. Head: no evidence of trauma, PERRL, EOMI, no exophtalmos or lid lag, no myxedema, no xanthelasma; normal ears, nose and oropharynx Neck: normal jugular venous pulsations and no hepatojugular reflux; brisk carotid pulses without delay and no carotid bruits Chest: clear to auscultation, no signs of consolidation by percussion or palpation, normal fremitus, symmetrical and full respiratory excursions Cardiovascular: normal  position and quality of the apical impulse, regular rhythm, normal first and paradoxically split second heart sounds, no murmurs, rubs or gallops Abdomen: no tenderness or distention, no masses by palpation, no abnormal pulsatility or arterial bruits, normal bowel sounds, no hepatosplenomegaly Extremities: no clubbing, cyanosis or edema; 2+ radial, ulnar and brachial pulses bilaterally; 2+ right femoral, posterior tibial and dorsalis pedis pulses; 2+ left femoral, posterior tibial and dorsalis pedis pulses; no subclavian or femoral bruits Neurological: grossly nonfocal Psych: Normal mood and affect   Wt Readings from Last 3 Encounters:  01/13/18 118 lb (53.5 kg)  08/28/17 115 lb 6.4 oz (52.3 kg)  07/04/17 116 lb (52.6 kg)      Studies/Labs Reviewed:   EKG:  EKG is ordered today.  The ekg ordered today demonstrates AV sequential pacing with a positive R wave in lead V1. ASSESSMENT:    1. Complete heart block ([place])   2. Pacemaker - dual-chamber Medtronic   3. Paroxysmal atrial fibrillation ([facility])   4. Essential hypertension      PLAN:  In order of problems listed above:  1. CHB: Pacemaker dependent. Positive R wave in lead V1 suggests that her ventricular lead may be located in the coronary sinus/middle cardiac vein. This is confirmed by one of her old chest x-rays. 2. PM: Normal pacemaker function.  Remote download every 3 months, yearly office visits. 3. AFib: None has been documented since late 2015.  Not on anticoagulation. 4. HTN: Not well controlled.  Increase metoprolol to 25 mg twice daily. 5. I think she is at low risk for major cardiovascular complications with possible breast surgery.  Cautioned that should be taken from a cardiac point of view is to make sure her device is programmed to asynchronous VOO mode if cautery is going to be used the vicinity of the left chest. 6. She does not require endocarditis prophylaxis with dental procedures.      Medication  Adjustments/Labs and Tests Ordered: Current medicines are reviewed at length with the patient today.  Concerns regarding medicines are outlined above.  Medication changes, Labs and Tests ordered today are listed in the Patient Instructions below. Patient Instructions  Dr Sallyanne Kuster recommends that you continue on your current medications as directed. Please refer to the Current Medication list given to you today.  Remote monitoring is used to monitor your Pacemaker or  ICD from home. This monitoring reduces the number of office visits required to check your device to one time per year. It allows [place] to keep an eye on the functioning of your device to ensure it is working properly. You are scheduled for a device check from home on Monday, September 9th, 2019. You may send your transmission at any time that day. If you have a wireless device, the transmission will be sent automatically. After your physician reviews your transmission, you will receive a notification with your next transmission date.  To improve our patient care and to more adequately follow your device, [facility] has decided, as a practice, to start following each patient four times a year with your home monitor. This means that you may experience a remote appointment that is close to an in-office appointment with your physician. Your insurance will apply at the same rate as other remote monitoring transmissions.  Dr Sallyanne Kuster recommends that you schedule a follow-up appointment in 12 months with a pacemaker check. You will receive a reminder letter in the mail two months in advance. If you don't receive a letter, please call our office to schedule the follow-up appointment.  If you need a refill on your cardiac medications before your next appointment, please call your pharmacy.      Signed, Sanda Klein, MD  01/13/2018 3:55 PM    [address], [place], [place]  [address] Phone: [phone];  Fax: [phone]

## 2018-02-03 ENCOUNTER — Telehealth: Payer: Self-pay | Admitting: Cardiovascular Disease

## 2018-02-03 NOTE — Telephone Encounter (Signed)
Per Cecilie Kicks, NP I called pt's dental office to verify they did receive clearance letter faxed through computer. I left message to please call the office if clearance letter is not received so that we may fax over a hard copy to their office. I will remove from Pre Op Call back.

## 2018-02-03 NOTE — Telephone Encounter (Signed)
° °  [facility] Pre-operative Risk Assessment    Request for surgical clearance:  1. What type of surgery is being performed? Cleaning  2. When is this surgery scheduled? 8.1.19  3. What type of clearance is required (medical clearance vs. Pharmacy clearance to hold med vs. Both)? Pharmacy  4. Are there any medications that need to be held prior to surgery and how long?Amoxicillin   5. Practice name and name of physician performing surgery? Leodis Rains DDS  6. What is your office phone number [phone]   7.   What is your office fax number [phone]  8.   Anesthesia type (None, local, MAC, general) ? No   Pauline Good 02/03/2018, 1:51 PM  _________________________________________________________________   (provider comments below)

## 2018-02-03 NOTE — Telephone Encounter (Signed)
   Primary Cardiologist: Sanda Klein, MD  Chart reviewed as part of pre-operative protocol coverage. Given past medical history and time since last visit, based on ACC/AHA guidelines, Kathryn [name] would be at acceptable risk for the planned procedure without further cardiovascular testing.   She does not require endocarditis prophylaxis with dental procedures. Pt is only on ASA and may stop if needed for procedure.   I will route this recommendation to the requesting party via Epic fax function and remove from pre-op pool.  Please call with questions.  Cecilie Kicks, NP 02/03/2018, 2:30 PM

## 2018-03-17 ENCOUNTER — Ambulatory Visit (INDEPENDENT_AMBULATORY_CARE_PROVIDER_SITE_OTHER): Payer: Medicare Other | Admitting: *Deleted

## 2018-03-17 DIAGNOSIS — I442 Atrioventricular block, complete: Secondary | ICD-10-CM

## 2018-03-17 NOTE — Progress Notes (Signed)
Remote pacemaker transmission.   

## 2018-03-18 ENCOUNTER — Encounter: Payer: Self-pay | Admitting: Cardiology

## 2018-04-08 LAB — CUP PACEART REMOTE DEVICE CHECK
Battery Remaining Longevity: 116 mo
Implantable Lead Implant Date: 20000526
Implantable Lead Location: 753859
Implantable Pulse Generator Implant Date: 20170328
Lead Channel Pacing Threshold Amplitude: 0.75 V
Lead Channel Pacing Threshold Pulse Width: 0.4 ms
Lead Channel Setting Pacing Amplitude: 1.5 V
Lead Channel Setting Sensing Sensitivity: 4 mV
MDC IDC LEAD IMPLANT DT: 20000522
MDC IDC LEAD LOCATION: 753860
MDC IDC MSMT BATTERY IMPEDANCE: 207 Ohm
MDC IDC MSMT BATTERY VOLTAGE: 2.79 V
MDC IDC MSMT LEADCHNL RA IMPEDANCE VALUE: 420 Ohm
MDC IDC MSMT LEADCHNL RA PACING THRESHOLD AMPLITUDE: 0.5 V
MDC IDC MSMT LEADCHNL RA PACING THRESHOLD PULSEWIDTH: 0.4 ms
MDC IDC MSMT LEADCHNL RV IMPEDANCE VALUE: 714 Ohm
MDC IDC SESS DTM: 20190909151901
MDC IDC SET LEADCHNL RV PACING AMPLITUDE: 2 V
MDC IDC SET LEADCHNL RV PACING PULSEWIDTH: 0.4 ms
MDC IDC STAT BRADY AP VP PERCENT: 83 %
MDC IDC STAT BRADY AP VS PERCENT: 0 %
MDC IDC STAT BRADY AS VP PERCENT: 17 %
MDC IDC STAT BRADY AS VS PERCENT: 0 %

## 2018-04-28 ENCOUNTER — Other Ambulatory Visit: Payer: Self-pay | Admitting: Internal Medicine

## 2018-04-28 DIAGNOSIS — Z1231 Encounter for screening mammogram for malignant neoplasm of breast: Secondary | ICD-10-CM

## 2018-06-02 ENCOUNTER — Ambulatory Visit
Admission: RE | Admit: 2018-06-02 | Discharge: 2018-06-02 | Disposition: A | Discharge status: Home / Self Care | Payer: Medicare Other | Source: Ambulatory Visit | Attending: Internal Medicine | Admitting: Internal Medicine

## 2018-06-02 DIAGNOSIS — Z1231 Encounter for screening mammogram for malignant neoplasm of breast: Secondary | ICD-10-CM

## 2018-06-16 ENCOUNTER — Ambulatory Visit (INDEPENDENT_AMBULATORY_CARE_PROVIDER_SITE_OTHER): Payer: Medicare Other

## 2018-06-16 DIAGNOSIS — I495 Sick sinus syndrome: Secondary | ICD-10-CM | POA: Diagnosis not present

## 2018-06-18 NOTE — Progress Notes (Signed)
Remote pacemaker transmission.   

## 2018-07-31 LAB — CUP PACEART REMOTE DEVICE CHECK
Battery Impedance: 207 Ohm
Battery Remaining Longevity: 117 mo
Brady Statistic AP VS Percent: 0 %
Brady Statistic AS VS Percent: 0 %
Date Time Interrogation Session: 20191209170532
Implantable Lead Implant Date: 20000522
Implantable Lead Location: 753859
Implantable Lead Location: 753860
Lead Channel Impedance Value: 697 Ohm
Lead Channel Pacing Threshold Amplitude: 0.875 V
Lead Channel Pacing Threshold Pulse Width: 0.4 ms
Lead Channel Setting Pacing Amplitude: 1.5 V
Lead Channel Setting Pacing Amplitude: 2 V
Lead Channel Setting Sensing Sensitivity: 4 mV
MDC IDC LEAD IMPLANT DT: 20000526
MDC IDC MSMT BATTERY VOLTAGE: 2.79 V
MDC IDC MSMT LEADCHNL RA IMPEDANCE VALUE: 432 Ohm
MDC IDC MSMT LEADCHNL RA PACING THRESHOLD AMPLITUDE: 0.625 V
MDC IDC MSMT LEADCHNL RA PACING THRESHOLD PULSEWIDTH: 0.4 ms
MDC IDC PG IMPLANT DT: 20170328
MDC IDC SET LEADCHNL RV PACING PULSEWIDTH: 0.4 ms
MDC IDC STAT BRADY AP VP PERCENT: 83 %
MDC IDC STAT BRADY AS VP PERCENT: 17 %

## 2018-09-03 ENCOUNTER — Other Ambulatory Visit: Payer: Self-pay | Admitting: Cardiovascular Disease

## 2018-09-15 ENCOUNTER — Ambulatory Visit (INDEPENDENT_AMBULATORY_CARE_PROVIDER_SITE_OTHER): Payer: Medicare Other | Admitting: *Deleted

## 2018-09-15 DIAGNOSIS — I495 Sick sinus syndrome: Secondary | ICD-10-CM | POA: Diagnosis not present

## 2018-09-15 DIAGNOSIS — I442 Atrioventricular block, complete: Secondary | ICD-10-CM

## 2018-09-16 LAB — CUP PACEART REMOTE DEVICE CHECK
Battery Remaining Longevity: 112 mo
Battery Voltage: 2.79 V
Brady Statistic AP VP Percent: 83 %
Brady Statistic AS VP Percent: 17 %
Implantable Lead Implant Date: 20000526
Implantable Lead Location: 753860
Implantable Pulse Generator Implant Date: 20170328
Lead Channel Impedance Value: 414 Ohm
Lead Channel Pacing Threshold Amplitude: 0.625 V
Lead Channel Setting Pacing Amplitude: 1.5 V
Lead Channel Setting Pacing Amplitude: 2 V
Lead Channel Setting Pacing Pulse Width: 0.4 ms
Lead Channel Setting Sensing Sensitivity: 4 mV
MDC IDC LEAD IMPLANT DT: 20000522
MDC IDC LEAD LOCATION: 753859
MDC IDC MSMT BATTERY IMPEDANCE: 231 Ohm
MDC IDC MSMT LEADCHNL RA PACING THRESHOLD PULSEWIDTH: 0.4 ms
MDC IDC MSMT LEADCHNL RV IMPEDANCE VALUE: 700 Ohm
MDC IDC MSMT LEADCHNL RV PACING THRESHOLD AMPLITUDE: 0.75 V
MDC IDC MSMT LEADCHNL RV PACING THRESHOLD PULSEWIDTH: 0.4 ms
MDC IDC SESS DTM: 20200309135439
MDC IDC STAT BRADY AP VS PERCENT: 0 %
MDC IDC STAT BRADY AS VS PERCENT: 0 %

## 2018-09-23 NOTE — Progress Notes (Signed)
Remote pacemaker transmission.   

## 2018-12-15 ENCOUNTER — Ambulatory Visit (INDEPENDENT_AMBULATORY_CARE_PROVIDER_SITE_OTHER): Payer: Medicare Other | Admitting: *Deleted

## 2018-12-15 DIAGNOSIS — I495 Sick sinus syndrome: Secondary | ICD-10-CM | POA: Diagnosis not present

## 2018-12-15 DIAGNOSIS — I442 Atrioventricular block, complete: Secondary | ICD-10-CM

## 2018-12-15 LAB — CUP PACEART REMOTE DEVICE CHECK
Battery Impedance: 255 Ohm
Battery Remaining Longevity: 109 mo
Battery Voltage: 2.79 V
Brady Statistic AP VP Percent: 83 %
Brady Statistic AP VS Percent: 0 %
Brady Statistic AS VP Percent: 17 %
Brady Statistic AS VS Percent: 0 %
Date Time Interrogation Session: 20200608144633
Implantable Lead Implant Date: 20000522
Implantable Lead Implant Date: 20000526
Implantable Lead Location: 753859
Implantable Lead Location: 753860
Implantable Pulse Generator Implant Date: 20170328
Lead Channel Impedance Value: 420 Ohm
Lead Channel Impedance Value: 717 Ohm
Lead Channel Pacing Threshold Amplitude: 0.5 V
Lead Channel Pacing Threshold Amplitude: 0.75 V
Lead Channel Pacing Threshold Pulse Width: 0.4 ms
Lead Channel Pacing Threshold Pulse Width: 0.4 ms
Lead Channel Setting Pacing Amplitude: 1.5 V
Lead Channel Setting Pacing Amplitude: 2 V
Lead Channel Setting Pacing Pulse Width: 0.4 ms
Lead Channel Setting Sensing Sensitivity: 4 mV

## 2018-12-23 NOTE — Progress Notes (Signed)
Remote pacemaker transmission.   

## 2019-01-16 ENCOUNTER — Telehealth: Payer: Self-pay | Admitting: *Deleted

## 2019-01-16 NOTE — Telephone Encounter (Signed)

## 2019-01-19 ENCOUNTER — Ambulatory Visit (INDEPENDENT_AMBULATORY_CARE_PROVIDER_SITE_OTHER): Payer: Medicare Other | Admitting: Cardiovascular Disease

## 2019-01-19 ENCOUNTER — Encounter: Payer: Self-pay | Admitting: Cardiovascular Disease

## 2019-01-19 ENCOUNTER — Other Ambulatory Visit: Payer: Self-pay

## 2019-01-19 VITALS — BP 157/86 | HR 91 | Temp 97.4°F | Ht 64.0 in | Wt 115.4 lb

## 2019-01-19 DIAGNOSIS — I480 Paroxysmal atrial fibrillation: Secondary | ICD-10-CM

## 2019-01-19 DIAGNOSIS — I10 Essential (primary) hypertension: Secondary | ICD-10-CM

## 2019-01-19 MED ORDER — METOPROLOL TARTRATE 25 MG PO TABS: 25.0000 mg | ORAL_TABLET | Freq: Two times a day (BID) | ORAL | 3 refills | Status: DC

## 2019-01-19 NOTE — Patient Instructions (Signed)
Medication Instructions:  INCREASE the Metoprolol to 25 mg twice daily  If you need a refill on your cardiac medications before your next appointment, please call your pharmacy.   Lab work: None ordered  Testing/Procedures: None ordered  Follow-Up: At [facility], you and your health needs are our priority.  As part of our continuing mission to provide you with exceptional heart care, we have created designated Provider Care Teams.  These Care Teams include your primary Cardiologist (physician) and Advanced Practice Providers (APPs -  Physician Assistants and Nurse Practitioners) who all work together to provide you with the care you need, when you need it. You will need a follow up appointment in 12 months.  Please call our office 2 months in advance to schedule this appointment.  You may see Sanda Klein, MD or one of the following Advanced Practice Providers on your designated Care Team: [place], [place] . Fabian Sharp, PA-C  Any Other Special Instructions Will Be Listed Below (If Applicable). Please record your blood pressure and heart rate daily and call the office in 2 weeks at [phone] with those numbers.

## 2019-01-19 NOTE — Progress Notes (Signed)
Patient ID: Kathryn [name], female   DOB: Dec 15, 1927, 85 y.o.   MRN: [id number]    Cardiology Office Note    Date:  01/19/2019   ID:  Kathryn [name], DOB 1927/09/24, MRN [id number]  PCP:  Crist Infante, MD  Cardiologist:   Sanda Klein, MD   Chief Complaint  Patient presents with  . Atrial Fibrillation  . Pacemaker Check    History of Present Illness:  Kathryn [name] is a 85 y.o. female with complete heart block and history of paroxysmal atrial fibrillation returning for evaluation of her pacemaker. She underwent a generator change out in 2017.   Her episodes of atrial fibrillation are very infrequent.  She had atrial fibrillation in 2015 and then did not have any until an 11-hour episode in October 2019.  She has declined oral anticoagulants, despite high embolic risk (CHADSVasc 4: age 37, gender, HTN)   She does not have a history of stroke or TIA or other embolic events.  Her pacemaker download in early June showed normal device function.  Estimated generator longevity is about 9 years.  There is 82% atrial pacing and 100% ventricular pacing.  The patient specifically denies any chest pain at rest exertion, dyspnea at rest or with exertion, orthopnea, paroxysmal nocturnal dyspnea, syncope, palpitations, focal neurological deficits, intermittent claudication, lower extremity edema, unexplained weight gain, cough, hemoptysis or wheezing.  It appears she never change the dose of metoprolol as recommended at her previous appointment.  She has not been taking her blood pressure, since her son has been using her blood pressure cuff.  She does have some mild orthostatic hypotension symptoms but has not had syncope or falls.  Her blood pressure remains elevated, systolic blood pressure still greater than 150 even after 20 minutes of rest in the clinic today.  She inquires about the safety of having redo pacemaker implants.  I advised her that I think any cosmetic surgery has risks  that are probably too high at age 28.  If she does decide to go ahead with it, it is critical that her pacemaker is temporarily reprogrammed to the low voltage, since she is pacemaker dependent.  She has complete heart block and a dual-chamber Medtronic pacemaker which was implanted in 2000, with a generator change out in 2008. In 2000 she presented with what appeared to be an acute anterior wall myocardial infarction following a bradycardia-related ventricular fibrillation episode during treadmill stress testing as an outpatient. She underwent emergency cardiac catheterization and was found to have normal coronary arteries with a Takotsubo cardiomyopathy. She has treated systemic hypertension. She has very infrequent but lengthy episodes of asymptomatic paroxysmal atrial fibrillation.  Past Medical History:  Diagnosis Date  . Arthritis    HANDS  . Bradycardia   . DVT (deep venous thrombosis) ([place]) 2010  . Dysrhythmia    HX OF PAROXYSMAL ATRIAL FIB- TAKES ASPIRIN FOR BLOOD THINNER  . GERD (gastroesophageal reflux disease)   . Hypertension   . Peripheral vascular disease ([place])    Blood clot behind Right knee  . Presence of permanent cardiac pacemaker    COMPLETE HEART BLOCK--DR. Shonda Mandarino     Past Surgical History:  Procedure Laterality Date  . ABDOMINAL HYSTERECTOMY    . APPENDECTOMY    . AUGMENTATION MAMMAPLASTY Bilateral   . BREAST ENHANCEMENT SURGERY    . BREAST IMPLANT REMOVAL Bilateral 08/28/2017   Procedure: REMOVAL BREAST IMPLANTS BILATERAL;  Surgeon: Wallace Going, DO;  Location: [facility];  Service:  Plastics;  Laterality: Bilateral;  . CATARACT EXTRACTION, BILATERAL    . CHOLECYSTECTOMY    . COLONOSCOPY N/A 03/05/2014   Procedure: COLONOSCOPY;  Surgeon: Shann Medal, MD;  Location: [facility];  Service: General;  Laterality: N/A;  . COLOSTOMY CLOSURE N/A 05/21/2014   Procedure: LAPAROSCOPIC ASSISTED CLOSURE OF COLOSTOMY AND REPAIR OF TRANSVERSE COLON  AND ENTEROTOMY;  Surgeon: Alphonsa Overall, MD;  Location: [facility];  Service: General;  Laterality: N/A;  . EP IMPLANTABLE DEVICE N/A 10/04/2015   Procedure: PPM/BIV PPM Generator Changeout;  Surgeon: Sanda Klein, MD;  Location: [facility];  Service: Cardiovascular;  Laterality: N/A;  . LAPAROTOMY N/A 08/27/2013   Procedure: LAPAROSCOPY CONVERTED TO OPEN LAPAROTOMY WITH SIGMOID COLECTOMY AND COLOSTOMY;  Surgeon: Shann Medal, MD;  Location: [facility];  Service: General;  Laterality: N/A;  . OPEN REDUCTION INTERNAL FIXATION (ORIF) DISTAL RADIAL FRACTURE Left 02/15/2015   Procedure: OPEN TREATMENT OF LEFT DISTAL RADIUS FRACTURE;  Surgeon: Milly Jakob, MD;  Location: [facility];  Service: Orthopedics;  Laterality: Left;  . PACEMAKER INSERTION  12/02/1998   medtronic  . PERMANENT PACEMAKER GENERATOR CHANGE  02/28/2007   Medtronic Adapta  . TONSILLECTOMY    . US ECHOCARDIOGRAPHY  10/09/2011   mild LAE, mild MR,mild to mod TR    Outpatient Medications Prior to Visit  Medication Sig Dispense Refill  . acetaminophen (TYLENOL) 500 MG tablet Take 1 tablet (500 mg total) by mouth every 6 (six) hours as needed. 50 tablet 0  . Ascorbic Acid (VITAMIN C PO) Take 1 tablet by mouth daily.    Marland Kitchen aspirin EC 81 MG tablet Take 81 mg by mouth every morning.     . Calcium 200 MG TABS Take 200 mg by mouth 2 (two) times daily.    . cholecalciferol (VITAMIN D) 1000 units tablet Take 1,000 Units by mouth daily.    Marland Kitchen olmesartan (BENICAR) 40 MG tablet Take 40 mg by mouth daily.    Vladimir Faster Glycol-Propyl Glycol (SYSTANE OP) Place 1 drop into both eyes daily as needed (dry eyes).     . vitamin B-12 (CYANOCOBALAMIN) 1000 MCG tablet Take 1,000 mcg by mouth daily.    . metoprolol tartrate (LOPRESSOR) 25 MG tablet Take 12.5 mg by mouth 2 (two) times daily.      No facility-administered medications prior to visit.      Allergies:   Codeine, Darvocet [propoxyphene n-acetaminophen], Percocet  [oxycodone-acetaminophen], and Epinephrine   Social History   Socioeconomic History  . Marital status: Widowed    Spouse name: Not on file  . Number of children: Not on file  . Years of education: Not on file  . Highest education level: Not on file  Occupational History  . Not on file  Social Needs  . Financial resource strain: Not on file  . Food insecurity    Worry: Not on file    Inability: Not on file  . Transportation needs    Medical: Not on file    Non-medical: Not on file  Tobacco Use  . Smoking status: Never Smoker  . Smokeless tobacco: Never Used  Substance and Sexual Activity  . Alcohol use: No  . Drug use: No  . Sexual activity: Never  Lifestyle  . Physical activity    Days per week: Not on file    Minutes per session: Not on file  . Stress: Not on file  Relationships  . Social Herbalist on phone: Not  on file    Gets together: Not on file    Attends religious service: Not on file    Active member of club or organization: Not on file    Attends meetings of clubs or organizations: Not on file    Relationship status: Not on file  Other Topics Concern  . Not on file  Social History Narrative  . Not on file     Family History:  The patient's family history includes Heart disease in her father and mother.   ROS:   Please see the history of present illness.    ROS All other systems are reviewed and are negative  PHYSICAL EXAM:   VS:  BP (!) 157/86   Pulse 91   Temp (!) 97.4 F (36.3 C)   Ht 5\' 4"  (1.626 m)   Wt 115 lb 6.4 oz (52.3 kg)   SpO2 99%   BMI 19.81 kg/m      General: Alert, oriented x3, no distress, very lean.  Healthy left subclavian pacemaker site. Head: no evidence of trauma, PERRL, EOMI, no exophtalmos or lid lag, no myxedema, no xanthelasma; normal ears, nose and oropharynx Neck: normal jugular venous pulsations and no hepatojugular reflux; brisk carotid pulses without delay and no carotid bruits Chest: clear to  auscultation, no signs of consolidation by percussion or palpation, normal fremitus, symmetrical and full respiratory excursions Cardiovascular: normal position and quality of the apical impulse, regular rhythm, normal first and second heart sounds, no murmurs, rubs or gallops Abdomen: no tenderness or distention, no masses by palpation, no abnormal pulsatility or arterial bruits, normal bowel sounds, no hepatosplenomegaly Extremities: no clubbing, cyanosis or edema; 2+ radial, ulnar and brachial pulses bilaterally; 2+ right femoral, posterior tibial and dorsalis pedis pulses; 2+ left femoral, posterior tibial and dorsalis pedis pulses; no subclavian or femoral bruits Neurological: grossly nonfocal Psych: Normal mood and affect    Wt Readings from Last 3 Encounters:  01/19/19 115 lb 6.4 oz (52.3 kg)  01/13/18 118 lb (53.5 kg)  08/28/17 115 lb 6.4 oz (52.3 kg)      Studies/Labs Reviewed:   EKG:  EKG is ordered today.  The ekg ordered today demonstrates AV sequential pacing with a positive R wave in lead V1. ASSESSMENT:    1. Essential hypertension   2. Paroxysmal atrial fibrillation (HCC)      PLAN:  In order of problems listed above:  1. CHB: She is pacemaker dependent.  2. PM: Normal pacemaker function. Positive R wave in lead V1 suggests that her ventricular lead may be located in the coronary sinus/middle cardiac vein. This is confirmed by one of her old chest x-rays. Remote download every 3 months, yearly office visits.  3. AFib: She has only had 2 episodes in the last 5 years in November 2015 and October 2019.  Both episodes were lengthy lasting several hours.  Declines anticoagulation and prefers to remain on aspirin. CHADSVasc 4. 4. HTN: Increase metoprolol tartrate to 25 mg twice daily and send [place] a blood pressure log in 2 weeks.   Medication Adjustments/Labs and Tests Ordered: Current medicines are reviewed at length with the patient today.  Concerns regarding medicines  are outlined above.  Medication changes, Labs and Tests ordered today are listed in the Patient Instructions below. Patient Instructions  Medication Instructions:  INCREASE the Metoprolol to 25 mg twice daily  If you need a refill on your cardiac medications before your next appointment, please call your pharmacy.   Lab work: None  ordered  Testing/Procedures: None ordered  Follow-Up: At [facility], you and your health needs are our priority.  As part of our continuing mission to provide you with exceptional heart care, we have created designated Provider Care Teams.  These Care Teams include your primary Cardiologist (physician) and Advanced Practice Providers (APPs -  Physician Assistants and Nurse Practitioners) who all work together to provide you with the care you need, when you need it. You will need a follow up appointment in 12 months.  Please call our office 2 months in advance to schedule this appointment.  You may see Sanda Klein, MD or one of the following Advanced Practice Providers on your designated Care Team: [place], [place] . Fabian Sharp, PA-C  Any Other Special Instructions Will Be Listed Below (If Applicable). Please record your blood pressure and heart rate daily and call the office in 2 weeks at [phone] with those numbers.        Signed, Sanda Klein, MD  01/19/2019 11:14 AM    [facility] [address], [place], [place]  [address] Phone: [phone]; Fax: [phone]

## 2019-03-17 ENCOUNTER — Ambulatory Visit (INDEPENDENT_AMBULATORY_CARE_PROVIDER_SITE_OTHER): Payer: Medicare Other | Admitting: *Deleted

## 2019-03-17 DIAGNOSIS — I495 Sick sinus syndrome: Secondary | ICD-10-CM | POA: Diagnosis not present

## 2019-03-17 LAB — CUP PACEART REMOTE DEVICE CHECK
Battery Impedance: 256 Ohm
Battery Remaining Longevity: 110 mo
Battery Voltage: 2.79 V
Brady Statistic AP VP Percent: 83 %
Brady Statistic AP VS Percent: 0 %
Brady Statistic AS VP Percent: 17 %
Brady Statistic AS VS Percent: 0 %
Date Time Interrogation Session: 20200908155656
Implantable Lead Implant Date: 20000522
Implantable Lead Implant Date: 20000526
Implantable Lead Location: 753859
Implantable Lead Location: 753860
Implantable Pulse Generator Implant Date: 20170328
Lead Channel Impedance Value: 420 Ohm
Lead Channel Impedance Value: 734 Ohm
Lead Channel Pacing Threshold Amplitude: 0.5 V
Lead Channel Pacing Threshold Amplitude: 0.75 V
Lead Channel Pacing Threshold Pulse Width: 0.4 ms
Lead Channel Pacing Threshold Pulse Width: 0.4 ms
Lead Channel Setting Pacing Amplitude: 1.5 V
Lead Channel Setting Pacing Amplitude: 2 V
Lead Channel Setting Pacing Pulse Width: 0.4 ms
Lead Channel Setting Sensing Sensitivity: 4 mV

## 2019-04-01 ENCOUNTER — Encounter: Payer: Self-pay | Admitting: Cardiology

## 2019-04-01 NOTE — Progress Notes (Signed)
Remote pacemaker transmission.   

## 2019-06-16 ENCOUNTER — Ambulatory Visit (INDEPENDENT_AMBULATORY_CARE_PROVIDER_SITE_OTHER): Payer: Medicare Other | Admitting: *Deleted

## 2019-06-16 DIAGNOSIS — I442 Atrioventricular block, complete: Secondary | ICD-10-CM

## 2019-06-17 LAB — CUP PACEART REMOTE DEVICE CHECK
Battery Impedance: 279 Ohm
Battery Remaining Longevity: 106 mo
Battery Voltage: 2.79 V
Brady Statistic AP VP Percent: 84 %
Brady Statistic AP VS Percent: 0 %
Brady Statistic AS VP Percent: 16 %
Brady Statistic AS VS Percent: 0 %
Date Time Interrogation Session: 20201208115735
Implantable Lead Implant Date: 20000522
Implantable Lead Implant Date: 20000526
Implantable Lead Location: 753859
Implantable Lead Location: 753860
Implantable Pulse Generator Implant Date: 20170328
Lead Channel Impedance Value: 425 Ohm
Lead Channel Impedance Value: 698 Ohm
Lead Channel Pacing Threshold Amplitude: 0.5 V
Lead Channel Pacing Threshold Amplitude: 0.75 V
Lead Channel Pacing Threshold Pulse Width: 0.4 ms
Lead Channel Pacing Threshold Pulse Width: 0.4 ms
Lead Channel Setting Pacing Amplitude: 1.5 V
Lead Channel Setting Pacing Amplitude: 2 V
Lead Channel Setting Pacing Pulse Width: 0.4 ms
Lead Channel Setting Sensing Sensitivity: 4 mV

## 2019-09-15 ENCOUNTER — Ambulatory Visit (INDEPENDENT_AMBULATORY_CARE_PROVIDER_SITE_OTHER): Payer: Medicare Other | Admitting: *Deleted

## 2019-09-15 DIAGNOSIS — I442 Atrioventricular block, complete: Secondary | ICD-10-CM | POA: Diagnosis not present

## 2019-09-15 LAB — CUP PACEART REMOTE DEVICE CHECK
Battery Impedance: 304 Ohm
Battery Remaining Longevity: 103 mo
Battery Voltage: 2.79 V
Brady Statistic AP VP Percent: 85 %
Brady Statistic AP VS Percent: 0 %
Brady Statistic AS VP Percent: 15 %
Brady Statistic AS VS Percent: 0 %
Date Time Interrogation Session: 20210309150750
Implantable Lead Implant Date: 20000522
Implantable Lead Implant Date: 20000526
Implantable Lead Location: 753859
Implantable Lead Location: 753860
Implantable Pulse Generator Implant Date: 20170328
Lead Channel Impedance Value: 425 Ohm
Lead Channel Impedance Value: 695 Ohm
Lead Channel Pacing Threshold Amplitude: 0.5 V
Lead Channel Pacing Threshold Amplitude: 0.75 V
Lead Channel Pacing Threshold Pulse Width: 0.4 ms
Lead Channel Pacing Threshold Pulse Width: 0.4 ms
Lead Channel Setting Pacing Amplitude: 1.5 V
Lead Channel Setting Pacing Amplitude: 2 V
Lead Channel Setting Pacing Pulse Width: 0.4 ms
Lead Channel Setting Sensing Sensitivity: 4 mV

## 2019-09-16 NOTE — Progress Notes (Signed)
PPM Remote  

## 2019-12-15 ENCOUNTER — Ambulatory Visit (INDEPENDENT_AMBULATORY_CARE_PROVIDER_SITE_OTHER): Payer: Medicare Other | Admitting: *Deleted

## 2019-12-15 DIAGNOSIS — I495 Sick sinus syndrome: Secondary | ICD-10-CM | POA: Diagnosis not present

## 2019-12-15 LAB — CUP PACEART REMOTE DEVICE CHECK
Battery Impedance: 353 Ohm
Battery Remaining Longevity: 99 mo
Battery Voltage: 2.79 V
Brady Statistic AP VP Percent: 85 %
Brady Statistic AP VS Percent: 0 %
Brady Statistic AS VP Percent: 15 %
Brady Statistic AS VS Percent: 0 %
Date Time Interrogation Session: 20210608101839
Implantable Lead Implant Date: 20000522
Implantable Lead Implant Date: 20000526
Implantable Lead Location: 753859
Implantable Lead Location: 753860
Implantable Pulse Generator Implant Date: 20170328
Lead Channel Impedance Value: 437 Ohm
Lead Channel Impedance Value: 705 Ohm
Lead Channel Pacing Threshold Amplitude: 0.625 V
Lead Channel Pacing Threshold Amplitude: 0.75 V
Lead Channel Pacing Threshold Pulse Width: 0.4 ms
Lead Channel Pacing Threshold Pulse Width: 0.4 ms
Lead Channel Setting Pacing Amplitude: 1.5 V
Lead Channel Setting Pacing Amplitude: 2 V
Lead Channel Setting Pacing Pulse Width: 0.4 ms
Lead Channel Setting Sensing Sensitivity: 4 mV

## 2019-12-16 NOTE — Progress Notes (Signed)
Remote pacemaker transmission.   

## 2020-01-25 ENCOUNTER — Encounter: Payer: Self-pay | Admitting: Cardiovascular Disease

## 2020-01-25 ENCOUNTER — Ambulatory Visit (INDEPENDENT_AMBULATORY_CARE_PROVIDER_SITE_OTHER): Payer: Medicare Other | Admitting: Cardiovascular Disease

## 2020-01-25 ENCOUNTER — Other Ambulatory Visit: Payer: Self-pay

## 2020-01-25 VITALS — BP 172/88 | HR 89 | Ht 64.0 in | Wt 119.0 lb

## 2020-01-25 DIAGNOSIS — I480 Paroxysmal atrial fibrillation: Secondary | ICD-10-CM | POA: Diagnosis not present

## 2020-01-25 DIAGNOSIS — I442 Atrioventricular block, complete: Secondary | ICD-10-CM

## 2020-01-25 DIAGNOSIS — I10 Essential (primary) hypertension: Secondary | ICD-10-CM | POA: Diagnosis not present

## 2020-01-25 DIAGNOSIS — Z45010 Encounter for checking and testing of cardiac pacemaker pulse generator [battery]: Secondary | ICD-10-CM

## 2020-01-25 MED ORDER — METOPROLOL TARTRATE 50 MG PO TABS: 50.0000 mg | ORAL_TABLET | Freq: Two times a day (BID) | ORAL | 0 refills | Status: DC

## 2020-01-25 NOTE — Patient Instructions (Signed)
Medication Instructions:  INCREASE the Metoprolol Tartrate to 50 mg twice daily  *If you need a refill on your cardiac medications before your next appointment, please call your pharmacy*   Lab Work: None ordered If you have labs (blood work) drawn today and your tests are completely normal, you will receive your results only by: Marland Kitchen MyChart Message (if you have MyChart) OR . A paper copy in the mail If you have any lab test that is abnormal or we need to change your treatment, we will call you to review the results.   Testing/Procedures: None ordered   Follow-Up: At [facility], you and your health needs are our priority.  As part of our continuing mission to provide you with exceptional heart care, we have created designated Provider Care Teams.  These Care Teams include your primary Cardiologist (physician) and Advanced Practice Providers (APPs -  Physician Assistants and Nurse Practitioners) who all work together to provide you with the care you need, when you need it.  We recommend signing up for the patient portal called "MyChart".  Sign up information is provided on this After Visit Summary.  MyChart is used to connect with patients for Virtual Visits (Telemedicine).  Patients are able to view lab/test results, encounter notes, upcoming appointments, etc.  Non-urgent messages can be sent to your provider as well.   To learn more about what you can do with MyChart, go to [email or web address].    Your next appointment:   3 month(s) on a pacer day  The format for your next appointment:   In Person  Provider:   Sanda Klein, MD

## 2020-01-25 NOTE — Progress Notes (Signed)
Patient ID: Kathryn [name], female   DOB: May 07, 1928, 86 y.o.   MRN: [id number]    Cardiology Office Note    Date:  01/25/2020   ID:  Kathryn [name], DOB 1927/09/15, MRN [id number]  PCP:  Crist Infante, MD  Cardiologist:   Sanda Klein, MD   Chief Complaint  Patient presents with  . Atrial Fibrillation  . Pacemaker Check    History of Present Illness:  Kathryn [name] is a 86 y.o. female with complete heart block and history of paroxysmal atrial fibrillation returning for evaluation of her pacemaker. She underwent a generator change out in 2017.   Her episodes of atrial fibrillation are very infrequent.  She had atrial fibrillation in 2015 and then did not have any until an 11-hour episode in October 2019.  She has declined oral anticoagulants, despite high embolic risk (CHADSVasc 4: age 47, gender, HTN)   She does not have a history of stroke or TIA or other embolic events.  Interrogation of her pacemaker today shows that she has had a few more episodes of atrial fibrillation this year.  She had 3 episodes of back-to-back paroxysmal atrial fibrillation in January lasting for about 16 hours and she had a 19-hour episode of paroxysmal atrial fibrillation on July 14 for an overall burden of atrial fibrillation of only 0.2%.  Rate control has been good.  She is completely unaware of the arrhythmia.  She remains resistant to the recommendation to start anticoagulants.  Otherwise pacemaker function is normal.  She has 85% atrial pacing and 100% ventricular pacing due to complete heart block.  Estimated generator longevity is about 8 years.  Heart rate histogram distribution is appropriate.  She had surgery earlier in the year to remove leaking breast implants, without complications.  She had a fall in February when she was trying to sit on the commode and missed.  She is very worried about the risks of bleeding and excessive bruising with anticoagulants.  The patient specifically  denies any chest pain at rest exertion, dyspnea at rest or with exertion, orthopnea, paroxysmal nocturnal dyspnea, syncope, palpitations, focal neurological deficits, intermittent claudication, lower extremity edema, unexplained weight gain, cough, hemoptysis or wheezing.  Her blood pressure has been consistently high.  She does have a history of mild orthostatic hypotension but this has not been a big problem recently.  Even after allowing her to relax in the clinic for about 30 minutes her blood pressure remained 170/90.  She has complete heart block and a dual-chamber Medtronic pacemaker which was implanted in 2000, with a generator change out in 2008. In 2000 she presented with what appeared to be an acute anterior wall myocardial infarction following a bradycardia-related ventricular fibrillation episode during treadmill stress testing as an outpatient. She underwent emergency cardiac catheterization and was found to have normal coronary arteries with a Takotsubo cardiomyopathy. She has treated systemic hypertension. She has very infrequent but lengthy episodes of asymptomatic paroxysmal atrial fibrillation.  Past Medical History:  Diagnosis Date  . Arthritis    HANDS  . Bradycardia   . DVT (deep venous thrombosis) ([place]) 2010  . Dysrhythmia    HX OF PAROXYSMAL ATRIAL FIB- TAKES ASPIRIN FOR BLOOD THINNER  . GERD (gastroesophageal reflux disease)   . Hypertension   . Peripheral vascular disease ([place])    Blood clot behind Right knee  . Presence of permanent cardiac pacemaker    COMPLETE HEART BLOCK--DR. Takyla Kuchera     Past Surgical History:  Procedure Laterality Date  .  ABDOMINAL HYSTERECTOMY    . APPENDECTOMY    . AUGMENTATION MAMMAPLASTY Bilateral   . BREAST ENHANCEMENT SURGERY    . BREAST IMPLANT REMOVAL Bilateral 08/28/2017   Procedure: REMOVAL BREAST IMPLANTS BILATERAL;  Surgeon: Wallace Going, DO;  Location: [facility];  Service: Plastics;  Laterality:  Bilateral;  . CATARACT EXTRACTION, BILATERAL    . CHOLECYSTECTOMY    . COLONOSCOPY N/A 03/05/2014   Procedure: COLONOSCOPY;  Surgeon: Shann Medal, MD;  Location: [facility];  Service: General;  Laterality: N/A;  . COLOSTOMY CLOSURE N/A 05/21/2014   Procedure: LAPAROSCOPIC ASSISTED CLOSURE OF COLOSTOMY AND REPAIR OF TRANSVERSE COLON AND ENTEROTOMY;  Surgeon: Alphonsa Overall, MD;  Location: [facility];  Service: General;  Laterality: N/A;  . EP IMPLANTABLE DEVICE N/A 10/04/2015   Procedure: PPM/BIV PPM Generator Changeout;  Surgeon: Sanda Klein, MD;  Location: [facility];  Service: Cardiovascular;  Laterality: N/A;  . LAPAROTOMY N/A 08/27/2013   Procedure: LAPAROSCOPY CONVERTED TO OPEN LAPAROTOMY WITH SIGMOID COLECTOMY AND COLOSTOMY;  Surgeon: Shann Medal, MD;  Location: [facility];  Service: General;  Laterality: N/A;  . OPEN REDUCTION INTERNAL FIXATION (ORIF) DISTAL RADIAL FRACTURE Left 02/15/2015   Procedure: OPEN TREATMENT OF LEFT DISTAL RADIUS FRACTURE;  Surgeon: Milly Jakob, MD;  Location: [facility];  Service: Orthopedics;  Laterality: Left;  . PACEMAKER INSERTION  12/02/1998   medtronic  . PERMANENT PACEMAKER GENERATOR CHANGE  02/28/2007   Medtronic Adapta  . TONSILLECTOMY    . US ECHOCARDIOGRAPHY  10/09/2011   mild LAE, mild MR,mild to mod TR    Outpatient Medications Prior to Visit  Medication Sig Dispense Refill  . acetaminophen (TYLENOL) 500 MG tablet Take 1 tablet (500 mg total) by mouth every 6 (six) hours as needed. 50 tablet 0  . Ascorbic Acid (VITAMIN C PO) Take 1 tablet by mouth daily.    Marland Kitchen aspirin EC 81 MG tablet Take 81 mg by mouth every morning.     . Calcium 200 MG TABS Take 200 mg by mouth 2 (two) times daily.    . cholecalciferol (VITAMIN D) 1000 units tablet Take 1,000 Units by mouth daily.    Marland Kitchen olmesartan (BENICAR) 40 MG tablet Take 40 mg by mouth daily.    Vladimir Faster Glycol-Propyl Glycol (SYSTANE OP) Place 1 drop into both eyes daily as needed  (dry eyes).     . vitamin B-12 (CYANOCOBALAMIN) 1000 MCG tablet Take 1,000 mcg by mouth daily.    . metoprolol tartrate (LOPRESSOR) 25 MG tablet Take 1 tablet (25 mg total) by mouth 2 (two) times daily. 180 tablet 3   No facility-administered medications prior to visit.     Allergies:   Codeine, Darvocet [propoxyphene n-acetaminophen], Percocet [oxycodone-acetaminophen], and Epinephrine   Social History   Socioeconomic History  . Marital status: Widowed    Spouse name: Not on file  . Number of children: Not on file  . Years of education: Not on file  . Highest education level: Not on file  Occupational History  . Not on file  Tobacco Use  . Smoking status: Never Smoker  . Smokeless tobacco: Never Used  Vaping Use  . Vaping Use: Never used  Substance and Sexual Activity  . Alcohol use: No  . Drug use: No  . Sexual activity: Never  Other Topics Concern  . Not on file  Social History Narrative  . Not on file   Social Determinants of Health   Financial Resource Strain:   .  Difficulty of Paying Living Expenses:   Food Insecurity:   . Worried About Charity fundraiser in the Last Year:   . Arboriculturist in the Last Year:   Transportation Needs:   . Film/video editor (Medical):   Marland Kitchen Lack of Transportation (Non-Medical):   Physical Activity:   . Days of Exercise per Week:   . Minutes of Exercise per Session:   Stress:   . Feeling of Stress :   Social Connections:   . Frequency of Communication with Friends and Family:   . Frequency of Social Gatherings with Friends and Family:   . Attends Religious Services:   . Active Member of Clubs or Organizations:   . Attends Archivist Meetings:   Marland Kitchen Marital Status:      Family History:  The patient's family history includes Heart disease in her father and mother.   ROS:   Please see the history of present illness.    ROS All other systems are reviewed and are negative  PHYSICAL EXAM:   VS:  BP (!) 172/88    Pulse 89   Ht 5\' 4"  (1.626 m)   Wt 119 lb (54 kg)   SpO2 99%   BMI 20.43 kg/m      General: Alert, oriented x3, no distress, appears well, younger than stated age.  The pacemaker site in the right subclavian area is healthy. Head: no evidence of trauma, PERRL, EOMI, no exophtalmos or lid lag, no myxedema, no xanthelasma; normal ears, nose and oropharynx Neck: normal jugular venous pulsations and no hepatojugular reflux; brisk carotid pulses without delay and no carotid bruits Chest: clear to auscultation, no signs of consolidation by percussion or palpation, normal fremitus, symmetrical and full respiratory excursions Cardiovascular: normal position and quality of the apical impulse, regular rhythm, normal first and paradoxically split second heart sounds, no murmurs, rubs or gallops Abdomen: no tenderness or distention, no masses by palpation, no abnormal pulsatility or arterial bruits, normal bowel sounds, no hepatosplenomegaly Extremities: no clubbing, cyanosis or edema; 2+ radial, ulnar and brachial pulses bilaterally; 2+ right femoral, posterior tibial and dorsalis pedis pulses; 2+ left femoral, posterior tibial and dorsalis pedis pulses; no subclavian or femoral bruits Neurological: grossly nonfocal Psych: Normal mood and affect  Wt Readings from Last 3 Encounters:  01/25/20 119 lb (54 kg)  01/19/19 115 lb 6.4 oz (52.3 kg)  01/13/18 118 lb (53.5 kg)    Studies/Labs Reviewed:   EKG:  EKG is ordered today.  It shows atrial paced, ventricular paced rhythm with a distinct positive R wave in lead V1.  QTc 477 ms. ASSESSMENT:    1. CHB (complete heart block) (HCC)   2. Paroxysmal atrial fibrillation ([place])   3. Pacemaker battery depletion   4. Essential hypertension    PLAN:  In order of problems listed above:  1. CHB: Pacemaker dependent.  No underlying escape rhythm. 2. PM: Normal pacemaker function. Positive R wave in lead V1 suggests that her ventricular lead may be located  in the coronary sinus/middle cardiac vein. This is confirmed by one of her old chest x-rays. Remote download every 3 months, yearly office visits.  3. AFib: Although her atrial fibrillation remains relatively infrequent, clearly the incidence and prevalence of the arrhythmia is steadily increasing with age.  Reviewed again the increased risk of embolic stroke.  She asked about taking a higher dose of aspirin and I explained that this will not offer adequate protection.  Recommend oral anticoagulation  with Eliquis.  She told me that she will think about it but is not ready to commit at this time.  I asked her to at least promised to call immediately if she develops even brief transient neurological complaints such as amaurosis fugax, transient weakness or loss of sensation on one side of her face or body, aphasia, etc.  CHADSVasc 4 (age, gender, hypertension). 4. HTN: Increase metoprolol tartrate to 50 mg twice daily and send [place] a blood pressure log in 2 weeks.   Medication Adjustments/Labs and Tests Ordered: Current medicines are reviewed at length with the patient today.  Concerns regarding medicines are outlined above.  Medication changes, Labs and Tests ordered today are listed in the Patient Instructions below. Patient Instructions  Medication Instructions:  INCREASE the Metoprolol Tartrate to 50 mg twice daily  *If you need a refill on your cardiac medications before your next appointment, please call your pharmacy*   Lab Work: None ordered If you have labs (blood work) drawn today and your tests are completely normal, you will receive your results only by: Marland Kitchen MyChart Message (if you have MyChart) OR . A paper copy in the mail If you have any lab test that is abnormal or we need to change your treatment, we will call you to review the results.   Testing/Procedures: None ordered   Follow-Up: At [facility], you and your health needs are our priority.  As part of our continuing  mission to provide you with exceptional heart care, we have created designated Provider Care Teams.  These Care Teams include your primary Cardiologist (physician) and Advanced Practice Providers (APPs -  Physician Assistants and Nurse Practitioners) who all work together to provide you with the care you need, when you need it.  We recommend signing up for the patient portal called "MyChart".  Sign up information is provided on this After Visit Summary.  MyChart is used to connect with patients for Virtual Visits (Telemedicine).  Patients are able to view lab/test results, encounter notes, upcoming appointments, etc.  Non-urgent messages can be sent to your provider as well.   To learn more about what you can do with MyChart, go to [email or web address].    Your next appointment:   3 month(s) on a pacer day  The format for your next appointment:   In Person  Provider:   Sanda Klein, MD        Signed, Sanda Klein, MD  01/25/2020 7:00 PM    [address], [place], [place]  [address] Phone: [phone]; Fax: [phone]

## 2020-02-02 ENCOUNTER — Other Ambulatory Visit: Payer: Self-pay | Admitting: Cardiovascular Disease

## 2020-02-02 NOTE — Telephone Encounter (Signed)
New message   *STAT* If patient is at the pharmacy, call can be transferred to refill team.   1. Which medications need to be refilled? (please list name of each medication and dose if known) metoprolol tartrate (LOPRESSOR) 50 MG tablet  2. Which pharmacy/location (including street and city if local pharmacy) is medication to be sent to? [place] [place] [employer], [address]  3. Do they need a 30 day or 90 day supply? 90 day    Patient needs new prescription sent.

## 2020-02-03 MED ORDER — METOPROLOL TARTRATE 50 MG PO TABS: 50.0000 mg | ORAL_TABLET | Freq: Two times a day (BID) | ORAL | 3 refills | Status: DC

## 2020-02-03 NOTE — Telephone Encounter (Signed)
Rx(s) sent to pharmacy electronically.  

## 2020-03-03 ENCOUNTER — Other Ambulatory Visit: Payer: Self-pay | Admitting: Internal Medicine

## 2020-03-03 DIAGNOSIS — M48061 Spinal stenosis, lumbar region without neurogenic claudication: Secondary | ICD-10-CM

## 2020-03-11 ENCOUNTER — Ambulatory Visit
Admission: RE | Admit: 2020-03-11 | Discharge: 2020-03-11 | Disposition: A | Discharge status: Home / Self Care | Payer: Medicare Other | Source: Ambulatory Visit | Attending: Internal Medicine | Admitting: Internal Medicine

## 2020-03-11 DIAGNOSIS — M48061 Spinal stenosis, lumbar region without neurogenic claudication: Secondary | ICD-10-CM

## 2020-03-15 ENCOUNTER — Ambulatory Visit (INDEPENDENT_AMBULATORY_CARE_PROVIDER_SITE_OTHER): Payer: Medicare Other | Admitting: *Deleted

## 2020-03-15 DIAGNOSIS — I495 Sick sinus syndrome: Secondary | ICD-10-CM | POA: Diagnosis not present

## 2020-03-16 LAB — CUP PACEART REMOTE DEVICE CHECK
Battery Impedance: 427 Ohm
Battery Remaining Longevity: 89 mo
Battery Voltage: 2.78 V
Brady Statistic AP VP Percent: 98 %
Brady Statistic AP VS Percent: 0 %
Brady Statistic AS VP Percent: 2 %
Brady Statistic AS VS Percent: 0 %
Date Time Interrogation Session: 20210907104923
Implantable Lead Implant Date: 20000522
Implantable Lead Implant Date: 20000526
Implantable Lead Location: 753859
Implantable Lead Location: 753860
Implantable Pulse Generator Implant Date: 20170328
Lead Channel Impedance Value: 420 Ohm
Lead Channel Impedance Value: 709 Ohm
Lead Channel Pacing Threshold Amplitude: 0.625 V
Lead Channel Pacing Threshold Amplitude: 0.625 V
Lead Channel Pacing Threshold Pulse Width: 0.4 ms
Lead Channel Pacing Threshold Pulse Width: 0.4 ms
Lead Channel Setting Pacing Amplitude: 1.5 V
Lead Channel Setting Pacing Amplitude: 2 V
Lead Channel Setting Pacing Pulse Width: 0.46 ms
Lead Channel Setting Sensing Sensitivity: 4 mV

## 2020-03-17 NOTE — Progress Notes (Signed)
Remote pacemaker transmission.   

## 2020-05-16 ENCOUNTER — Ambulatory Visit (INDEPENDENT_AMBULATORY_CARE_PROVIDER_SITE_OTHER): Payer: Medicare Other | Admitting: Cardiovascular Disease

## 2020-05-16 ENCOUNTER — Other Ambulatory Visit: Payer: Self-pay

## 2020-05-16 ENCOUNTER — Encounter: Payer: Self-pay | Admitting: Cardiovascular Disease

## 2020-05-16 VITALS — BP 141/79 | HR 72 | Ht 64.0 in | Wt 122.0 lb

## 2020-05-16 DIAGNOSIS — I480 Paroxysmal atrial fibrillation: Secondary | ICD-10-CM

## 2020-05-16 NOTE — Patient Instructions (Signed)

## 2020-05-16 NOTE — Progress Notes (Signed)
Patient ID: Kathryn [name], female   DOB: [birth year], 86 y.o.   MRN: [id number]    Cardiology Office Note    Date:  05/17/2020   ID:  Kathryn [name], DOB 17-Oct-1927, MRN [id number]  PCP:  Crist Infante, MD  Cardiologist:   Sanda Klein, MD   Chief Complaint  Patient presents with  . Atrial Fibrillation  . Pacemaker Check    History of Present Illness:  Kathryn [name] is a 86 y.o. female with complete heart block and history of paroxysmal atrial fibrillation returning for evaluation of her pacemaker. She underwent a generator change out in 2017.   She has no cardiovascular complaints but complains of back pain on the right side.  She recently underwent a CT scan.  She is seeing a spine specialist and asked about the efficacy of injections or chiropractic interventions.  The patient specifically denies any chest pain at rest exertion, dyspnea at rest or with exertion, orthopnea, paroxysmal nocturnal dyspnea, syncope, palpitations, focal neurological deficits, intermittent claudication, lower extremity edema, unexplained weight gain, cough, hemoptysis or wheezing.  She does have occasional dizziness.  Her episodes of atrial fibrillation have increased a little bit in prevalence, now up to 1.9%.  She had 4 significant episodes of atrial fibrillation this fall (9 hours on March 18, 2019 2 hours on April 19, 2009 hours on October 31 and 10 hours on November 2.  There does not appear to be any correlation between her dizziness and the episodes of atrial fibrillation.  Her episodes of atrial fibrillation had previously been very infrequent.  She had atrial fibrillation in 2015 and then did not have any until an 11-hour episode in October 2019.  Episodes of atrial fibrillation became more prevalent in 2021.  She has declined oral anticoagulants, despite high embolic risk (CHADSVasc 4: age 63, gender, HTN)   She does not have a history of stroke or TIA or other embolic events.     Otherwise pacemaker function is normal.  She has 97.6 % atrial pacing and 100% ventricular pacing due to complete heart block.  Estimated generator longevity is about 7.5 years.  Heart rate histogram distribution is appropriate.  Her blood pressure was initially high at 161/83, but when I rechecked it it was down to 141/79.  She has a history of problems with orthostatic hypotension.  She has complete heart block and a dual-chamber Medtronic pacemaker which was implanted in 2000, with a generator change out in 2008. In 2000 she presented with what appeared to be an acute anterior wall myocardial infarction following a bradycardia-related ventricular fibrillation episode during treadmill stress testing as an outpatient. She underwent emergency cardiac catheterization and was found to have normal coronary arteries with a Takotsubo cardiomyopathy. She has treated systemic hypertension. She has very infrequent but lengthy episodes of asymptomatic paroxysmal atrial fibrillation.  Past Medical History:  Diagnosis Date  . Arthritis    HANDS  . Bradycardia   . DVT (deep venous thrombosis) ([place]) 2010  . Dysrhythmia    HX OF PAROXYSMAL ATRIAL FIB- TAKES ASPIRIN FOR BLOOD THINNER  . GERD (gastroesophageal reflux disease)   . Hypertension   . Peripheral vascular disease ([place])    Blood clot behind Right knee  . Presence of permanent cardiac pacemaker    COMPLETE HEART BLOCK--DR. Tilley Faeth     Past Surgical History:  Procedure Laterality Date  . ABDOMINAL HYSTERECTOMY    . APPENDECTOMY    . AUGMENTATION MAMMAPLASTY Bilateral   . BREAST ENHANCEMENT  SURGERY    . BREAST IMPLANT REMOVAL Bilateral 08/28/2017   Procedure: REMOVAL BREAST IMPLANTS BILATERAL;  Surgeon: Wallace Going, DO;  Location: [facility];  Service: Plastics;  Laterality: Bilateral;  . CATARACT EXTRACTION, BILATERAL    . CHOLECYSTECTOMY    . COLONOSCOPY N/A 03/05/2014   Procedure: COLONOSCOPY;  Surgeon: Shann Medal, MD;  Location: [facility];  Service: General;  Laterality: N/A;  . COLOSTOMY CLOSURE N/A 05/21/2014   Procedure: LAPAROSCOPIC ASSISTED CLOSURE OF COLOSTOMY AND REPAIR OF TRANSVERSE COLON AND ENTEROTOMY;  Surgeon: Alphonsa Overall, MD;  Location: [facility];  Service: General;  Laterality: N/A;  . EP IMPLANTABLE DEVICE N/A 10/04/2015   Procedure: PPM/BIV PPM Generator Changeout;  Surgeon: Sanda Klein, MD;  Location: [facility];  Service: Cardiovascular;  Laterality: N/A;  . LAPAROTOMY N/A 08/27/2013   Procedure: LAPAROSCOPY CONVERTED TO OPEN LAPAROTOMY WITH SIGMOID COLECTOMY AND COLOSTOMY;  Surgeon: Shann Medal, MD;  Location: [facility];  Service: General;  Laterality: N/A;  . OPEN REDUCTION INTERNAL FIXATION (ORIF) DISTAL RADIAL FRACTURE Left 02/15/2015   Procedure: OPEN TREATMENT OF LEFT DISTAL RADIUS FRACTURE;  Surgeon: Milly Jakob, MD;  Location: [facility];  Service: Orthopedics;  Laterality: Left;  . PACEMAKER INSERTION  12/02/1998   medtronic  . PERMANENT PACEMAKER GENERATOR CHANGE  02/28/2007   Medtronic Adapta  . TONSILLECTOMY    . US ECHOCARDIOGRAPHY  10/09/2011   mild LAE, mild MR,mild to mod TR    Outpatient Medications Prior to Visit  Medication Sig Dispense Refill  . acetaminophen (TYLENOL) 500 MG tablet Take 1 tablet (500 mg total) by mouth every 6 (six) hours as needed. 50 tablet 0  . Ascorbic Acid (VITAMIN C PO) Take 1 tablet by mouth daily.    Marland Kitchen aspirin EC 81 MG tablet Take 81 mg by mouth every morning.     . Calcium 200 MG TABS Take 200 mg by mouth 2 (two) times daily.    . cholecalciferol (VITAMIN D) 1000 units tablet Take 1,000 Units by mouth daily.    . metoprolol tartrate (LOPRESSOR) 50 MG tablet Take 1 tablet (50 mg total) by mouth 2 (two) times daily. 180 tablet 3  . olmesartan (BENICAR) 40 MG tablet Take 40 mg by mouth daily.    Vladimir Faster Glycol-Propyl Glycol (SYSTANE OP) Place 1 drop into both eyes daily as needed (dry eyes).     .  vitamin B-12 (CYANOCOBALAMIN) 1000 MCG tablet Take 1,000 mcg by mouth daily.     No facility-administered medications prior to visit.     Allergies:   Codeine, Darvocet [propoxyphene n-acetaminophen], Percocet [oxycodone-acetaminophen], and Epinephrine   Social History   Socioeconomic History  . Marital status: Widowed    Spouse name: Not on file  . Number of children: Not on file  . Years of education: Not on file  . Highest education level: Not on file  Occupational History  . Not on file  Tobacco Use  . Smoking status: Never Smoker  . Smokeless tobacco: Never Used  Vaping Use  . Vaping Use: Never used  Substance and Sexual Activity  . Alcohol use: No  . Drug use: No  . Sexual activity: Never  Other Topics Concern  . Not on file  Social History Narrative  . Not on file   Social Determinants of Health   Financial Resource Strain:   . Difficulty of Paying Living Expenses: Not on file  Food Insecurity:   . Worried About  Running Out of Food in the Last Year: Not on file  . Ran Out of Food in the Last Year: Not on file  Transportation Needs:   . Lack of Transportation (Medical): Not on file  . Lack of Transportation (Non-Medical): Not on file  Physical Activity:   . Days of Exercise per Week: Not on file  . Minutes of Exercise per Session: Not on file  Stress:   . Feeling of Stress : Not on file  Social Connections:   . Frequency of Communication with Friends and Family: Not on file  . Frequency of Social Gatherings with Friends and Family: Not on file  . Attends Religious Services: Not on file  . Active Member of Clubs or Organizations: Not on file  . Attends Archivist Meetings: Not on file  . Marital Status: Not on file     Family History:  The patient's family history includes Heart disease in her father and mother.   ROS:   Please see the history of present illness.    ROS All other systems are reviewed and are negative  PHYSICAL EXAM:   VS:   BP (!) 141/79   Pulse 72   Ht 5\' 4"  (1.626 m)   Wt 122 lb (55.3 kg)   SpO2 100%   BMI 20.94 kg/m      General: Alert, oriented x3, no distress, appears well, younger than stated age.  The pacemaker site in the right subclavian area is healthy. Head: no evidence of trauma, PERRL, EOMI, no exophtalmos or lid lag, no myxedema, no xanthelasma; normal ears, nose and oropharynx Neck: normal jugular venous pulsations and no hepatojugular reflux; brisk carotid pulses without delay and no carotid bruits Chest: clear to auscultation, no signs of consolidation by percussion or palpation, normal fremitus, symmetrical and full respiratory excursions Cardiovascular: normal position and quality of the apical impulse, regular rhythm, normal first and paradoxically split second heart sounds, no murmurs, rubs or gallops Abdomen: no tenderness or distention, no masses by palpation, no abnormal pulsatility or arterial bruits, normal bowel sounds, no hepatosplenomegaly Extremities: no clubbing, cyanosis or edema; 2+ radial, ulnar and brachial pulses bilaterally; 2+ right femoral, posterior tibial and dorsalis pedis pulses; 2+ left femoral, posterior tibial and dorsalis pedis pulses; no subclavian or femoral bruits Neurological: grossly nonfocal Psych: Normal mood and affect  Wt Readings from Last 3 Encounters:  05/16/20 122 lb (55.3 kg)  01/25/20 119 lb (54 kg)  01/19/19 115 lb 6.4 oz (52.3 kg)    Studies/Labs Reviewed:   EKG:  EKG is ordered today.  It shows atrial paced, ventricular paced rhythm with a distinct positive R wave in lead V1.  QRS is broad at 172 ms (paced), QTC 477 ms ASSESSMENT:    1. Paroxysmal atrial fibrillation (HCC)    PLAN:  In order of problems listed above:  1. CHB: There is no underlying escape rhythm she is pacemaker dependent. 2. PM: Normal device function. Positive R wave in lead V1 suggests that her ventricular lead may be located in the coronary sinus/middle cardiac vein.  This is confirmed by one of her old chest x-rays. Remote download every 3 months, yearly office visits.  3. AFib: I again reiterated my concern that her atrial fibrillation is now substantially more frequent and poses the risk of embolic stroke.  Recommended anticoagulants, but she again declines.  I reiterated the importance of promptly reporting any focal neurological complaints, no matter how brief.  CHADSVasc 4 (age, gender, hypertension).  4. HTN: Although her systolic blood pressure is a little bit high, she has had serious problems with orthostatic hypotension in the past.  Continue same medications.  I suspect orthostatic hypotension explains her occasional dizziness, not the atrial fibrillation.   Medication Adjustments/Labs and Tests Ordered: Current medicines are reviewed at length with the patient today.  Concerns regarding medicines are outlined above.  Medication changes, Labs and Tests ordered today are listed in the Patient Instructions below. Patient Instructions  Medication Instructions:  No changes *If you need a refill on your cardiac medications before your next appointment, please call your pharmacy*   Lab Work: None ordered If you have labs (blood work) drawn today and your tests are completely normal, you will receive your results only by: Marland Kitchen MyChart Message (if you have MyChart) OR . A paper copy in the mail If you have any lab test that is abnormal or we need to change your treatment, we will call you to review the results.   Testing/Procedures: None ordered   Follow-Up: At [facility], you and your health needs are our priority.  As part of our continuing mission to provide you with exceptional heart care, we have created designated Provider Care Teams.  These Care Teams include your primary Cardiologist (physician) and Advanced Practice Providers (APPs -  Physician Assistants and Nurse Practitioners) who all work together to provide you with the care you need,  when you need it.  We recommend signing up for the patient portal called "MyChart".  Sign up information is provided on this After Visit Summary.  MyChart is used to connect with patients for Virtual Visits (Telemedicine).  Patients are able to view lab/test results, encounter notes, upcoming appointments, etc.  Non-urgent messages can be sent to your provider as well.   To learn more about what you can do with MyChart, go to [email or web address].    Your next appointment:   12 month(s)  The format for your next appointment:   In Person  Provider:   Sanda Klein, MD        Signed, Sanda Klein, MD  05/17/2020 5:19 PM    [address], [place], [place]  [address] Phone: [phone]; Fax: [phone]

## 2020-05-17 ENCOUNTER — Encounter: Payer: Self-pay | Admitting: Cardiovascular Disease

## 2020-06-14 ENCOUNTER — Ambulatory Visit (INDEPENDENT_AMBULATORY_CARE_PROVIDER_SITE_OTHER): Payer: Medicare Other

## 2020-06-14 DIAGNOSIS — I495 Sick sinus syndrome: Secondary | ICD-10-CM

## 2020-06-14 LAB — CUP PACEART REMOTE DEVICE CHECK
Battery Impedance: 477 Ohm
Battery Remaining Longevity: 87 mo
Battery Voltage: 2.79 V
Brady Statistic AP VP Percent: 95 %
Brady Statistic AP VS Percent: 0 %
Brady Statistic AS VP Percent: 5 %
Brady Statistic AS VS Percent: 0 %
Date Time Interrogation Session: 20211207102903
Implantable Lead Implant Date: 20000522
Implantable Lead Implant Date: 20000526
Implantable Lead Location: 753859
Implantable Lead Location: 753860
Implantable Pulse Generator Implant Date: 20170328
Lead Channel Impedance Value: 425 Ohm
Lead Channel Impedance Value: 700 Ohm
Lead Channel Pacing Threshold Amplitude: 0.5 V
Lead Channel Pacing Threshold Amplitude: 0.75 V
Lead Channel Pacing Threshold Pulse Width: 0.4 ms
Lead Channel Pacing Threshold Pulse Width: 0.4 ms
Lead Channel Setting Pacing Amplitude: 1.5 V
Lead Channel Setting Pacing Amplitude: 2 V
Lead Channel Setting Pacing Pulse Width: 0.4 ms
Lead Channel Setting Sensing Sensitivity: 4 mV

## 2020-06-24 NOTE — Progress Notes (Signed)
Remote pacemaker transmission.   

## 2020-07-13 ENCOUNTER — Other Ambulatory Visit: Payer: Self-pay | Admitting: Cardiovascular Disease

## 2020-09-13 ENCOUNTER — Ambulatory Visit (INDEPENDENT_AMBULATORY_CARE_PROVIDER_SITE_OTHER): Payer: Medicare Other

## 2020-09-13 DIAGNOSIS — I442 Atrioventricular block, complete: Secondary | ICD-10-CM | POA: Diagnosis not present

## 2020-09-13 LAB — CUP PACEART REMOTE DEVICE CHECK
Battery Impedance: 526 Ohm
Battery Remaining Longevity: 81 mo
Battery Voltage: 2.79 V
Brady Statistic AP VP Percent: 96 %
Brady Statistic AP VS Percent: 0 %
Brady Statistic AS VP Percent: 4 %
Brady Statistic AS VS Percent: 0 %
Date Time Interrogation Session: 20220308111622
Implantable Lead Implant Date: 20000522
Implantable Lead Implant Date: 20000526
Implantable Lead Location: 753859
Implantable Lead Location: 753860
Implantable Pulse Generator Implant Date: 20170328
Lead Channel Impedance Value: 425 Ohm
Lead Channel Impedance Value: 700 Ohm
Lead Channel Pacing Threshold Amplitude: 0.625 V
Lead Channel Pacing Threshold Amplitude: 0.625 V
Lead Channel Pacing Threshold Pulse Width: 0.4 ms
Lead Channel Pacing Threshold Pulse Width: 0.4 ms
Lead Channel Setting Pacing Amplitude: 1.5 V
Lead Channel Setting Pacing Amplitude: 2 V
Lead Channel Setting Pacing Pulse Width: 0.46 ms
Lead Channel Setting Sensing Sensitivity: 4 mV

## 2020-09-22 NOTE — Progress Notes (Signed)
Remote pacemaker transmission.   

## 2020-12-31 ENCOUNTER — Other Ambulatory Visit: Payer: Self-pay | Admitting: Cardiovascular Disease

## 2021-01-03 ENCOUNTER — Telehealth: Payer: Self-pay | Admitting: Cardiovascular Disease

## 2021-01-03 NOTE — Telephone Encounter (Signed)
Patient received new cellular adapter, her remote monitor is running warm all of the time.  She has tried to send transmission and it is not going through.  Patient is not currently having any cardiac symptoms, she is trying to send routine report.  Provided [employer] # to contact tech support.

## 2021-01-03 NOTE — Telephone Encounter (Signed)
New message:     Patient calling to speak with some one in device

## 2021-01-06 NOTE — Telephone Encounter (Signed)
Spoke with patient, advised if she is not symptomatic then we can wait for new monitor to receive transmission.  If she has any cardiac concerns let us know so we can bring her into the office for device check,

## 2021-01-06 NOTE — Telephone Encounter (Signed)
Patient wanted to let the office know that she contacted [employer] and will be getting a new machine. She should get the new machine in about a week. Patient wanted to know what to do in the meantime

## 2021-03-14 ENCOUNTER — Ambulatory Visit (INDEPENDENT_AMBULATORY_CARE_PROVIDER_SITE_OTHER): Payer: Medicare Other

## 2021-03-14 DIAGNOSIS — I442 Atrioventricular block, complete: Secondary | ICD-10-CM

## 2021-03-14 LAB — CUP PACEART REMOTE DEVICE CHECK
Battery Impedance: 649 Ohm
Battery Remaining Longevity: 77 mo
Battery Voltage: 2.79 V
Brady Statistic AP VP Percent: 96 %
Brady Statistic AP VS Percent: 0 %
Brady Statistic AS VP Percent: 4 %
Brady Statistic AS VS Percent: 0 %
Date Time Interrogation Session: 20220906125944
Implantable Lead Implant Date: 20000522
Implantable Lead Implant Date: 20000526
Implantable Lead Location: 753859
Implantable Lead Location: 753860
Implantable Pulse Generator Implant Date: 20170328
Lead Channel Impedance Value: 449 Ohm
Lead Channel Impedance Value: 735 Ohm
Lead Channel Pacing Threshold Amplitude: 0.625 V
Lead Channel Pacing Threshold Amplitude: 0.625 V
Lead Channel Pacing Threshold Pulse Width: 0.4 ms
Lead Channel Pacing Threshold Pulse Width: 0.4 ms
Lead Channel Setting Pacing Amplitude: 1.5 V
Lead Channel Setting Pacing Amplitude: 2 V
Lead Channel Setting Pacing Pulse Width: 0.4 ms
Lead Channel Setting Sensing Sensitivity: 4 mV

## 2021-03-23 NOTE — Progress Notes (Signed)
Remote pacemaker transmission.   

## 2021-05-22 ENCOUNTER — Other Ambulatory Visit: Payer: Self-pay

## 2021-05-22 ENCOUNTER — Ambulatory Visit (INDEPENDENT_AMBULATORY_CARE_PROVIDER_SITE_OTHER): Payer: Medicare Other | Admitting: Cardiovascular Disease

## 2021-05-22 ENCOUNTER — Encounter: Payer: Self-pay | Admitting: Cardiovascular Disease

## 2021-05-22 VITALS — BP 145/81 | HR 71 | Ht 64.5 in | Wt 120.2 lb

## 2021-05-22 DIAGNOSIS — I10 Essential (primary) hypertension: Secondary | ICD-10-CM | POA: Diagnosis not present

## 2021-05-22 DIAGNOSIS — I480 Paroxysmal atrial fibrillation: Secondary | ICD-10-CM

## 2021-05-22 DIAGNOSIS — Z950 Presence of cardiac pacemaker: Secondary | ICD-10-CM

## 2021-05-22 DIAGNOSIS — I442 Atrioventricular block, complete: Secondary | ICD-10-CM | POA: Diagnosis not present

## 2021-05-22 NOTE — Progress Notes (Signed)
Patient ID: Kathryn [name], female   DOB: March 13, 1928, 87 y.o.   MRN: [id number]    Cardiology Office Note    Date:  05/23/2021   ID:  Kathryn [name], DOB February 06, 1928, MRN [id number]  PCP:  Crist Infante, MD  Cardiologist:   Sanda Klein, MD   No chief complaint on file.   History of Present Illness:  Kathryn [name] is a 87 y.o. female with complete heart block and history of paroxysmal atrial fibrillation returning for evaluation of her pacemaker. She underwent a generator change out in 2017.   She is generally doing quite well and has no cardiovascular complaints.  She continues to describe the fact that she has frequent nosebleeds (these usually stop quickly) and easy bruising, on aspirin.  Has a few aches and pains, but denies angina or dyspnea at rest or with activity, falls, serious injuries, focal neurological events, orthopnea/PND, lower extremity edema, claudication, palpitations, dizziness or syncope.  Pacemaker interrogation shows that she continues to have occasional episodes of atrial fibrillation, overall prevalence 1.9%, similar to her last office visit.  She had a lengthy 8-1/2-hour episode just last week on November 9.  On September 16 she had an episode lasted for 33 hours.  As all the other previous episodes, these were also asymptomatic.  When she is not in atrial fibrillation she is almost 100% atrial paced, ventricular paced with a reasonably good heart rate histogram considering her activity level.  Estimated generator longevity 6.5 years.  She has declined oral anticoagulants, despite high embolic risk (CHADSVasc 4: age 31, gender, HTN)   She does not have a history of stroke or TIA or other embolic events.    Blood pressure was high at 186/92.  When I rechecked it, her blood pressure was 145/81 mmHg.  Just a few weeks ago I had another office visit her blood pressure was 138/84.  She reports compliance with olmesartan.  She has complete heart block and a  dual-chamber Medtronic pacemaker which was implanted in 2000, with a generator change out in 2008. In 2000 she presented with what appeared to be an acute anterior wall myocardial infarction following a bradycardia-related ventricular fibrillation episode during treadmill stress testing as an outpatient. She underwent emergency cardiac catheterization and was found to have normal coronary arteries with a Takotsubo cardiomyopathy. She has treated systemic hypertension. She has very infrequent but lengthy episodes of asymptomatic paroxysmal atrial fibrillation.  Past Medical History:  Diagnosis Date   Arthritis    HANDS   Bradycardia    DVT (deep venous thrombosis) ([facility]) 2010   Dysrhythmia    HX OF PAROXYSMAL ATRIAL FIB- TAKES ASPIRIN FOR BLOOD THINNER   GERD (gastroesophageal reflux disease)    Hypertension    Peripheral vascular disease (HCC)    Blood clot behind Right knee   Presence of permanent cardiac pacemaker    COMPLETE HEART BLOCK--DR. Deborh Pense     Past Surgical History:  Procedure Laterality Date   ABDOMINAL HYSTERECTOMY     APPENDECTOMY     AUGMENTATION MAMMAPLASTY Bilateral    BREAST ENHANCEMENT SURGERY     BREAST IMPLANT REMOVAL Bilateral 08/28/2017   Procedure: REMOVAL BREAST IMPLANTS BILATERAL;  Surgeon: Wallace Going, DO;  Location: [facility];  Service: Plastics;  Laterality: Bilateral;   CATARACT EXTRACTION, BILATERAL     CHOLECYSTECTOMY     COLONOSCOPY N/A 03/05/2014   Procedure: COLONOSCOPY;  Surgeon: Shann Medal, MD;  Location: [facility];  Service: General;  Laterality:  N/A;   COLOSTOMY CLOSURE N/A 05/21/2014   Procedure: LAPAROSCOPIC ASSISTED CLOSURE OF COLOSTOMY AND REPAIR OF TRANSVERSE COLON AND ENTEROTOMY;  Surgeon: Alphonsa Overall, MD;  Location: [facility];  Service: General;  Laterality: N/A;   EP IMPLANTABLE DEVICE N/A 10/04/2015   Procedure: PPM/BIV PPM Generator Changeout;  Surgeon: Sanda Klein, MD;  Location: [facility];   Service: Cardiovascular;  Laterality: N/A;   LAPAROTOMY N/A 08/27/2013   Procedure: LAPAROSCOPY CONVERTED TO OPEN LAPAROTOMY WITH SIGMOID COLECTOMY AND COLOSTOMY;  Surgeon: Shann Medal, MD;  Location: [facility];  Service: General;  Laterality: N/A;   OPEN REDUCTION INTERNAL FIXATION (ORIF) DISTAL RADIAL FRACTURE Left 02/15/2015   Procedure: OPEN TREATMENT OF LEFT DISTAL RADIUS FRACTURE;  Surgeon: Milly Jakob, MD;  Location: [facility];  Service: Orthopedics;  Laterality: Left;   PACEMAKER INSERTION  12/02/1998   medtronic   PERMANENT PACEMAKER GENERATOR CHANGE  02/28/2007   Medtronic Adapta   TONSILLECTOMY     US ECHOCARDIOGRAPHY  10/09/2011   mild LAE, mild MR,mild to mod TR    Outpatient Medications Prior to Visit  Medication Sig Dispense Refill   Ascorbic Acid (VITAMIN C PO) Take 1 tablet by mouth daily.     aspirin EC 81 MG tablet Take 81 mg by mouth every morning.      Calcium 200 MG TABS Take 200 mg by mouth 2 (two) times daily.     cholecalciferol (VITAMIN D) 1000 units tablet Take 1,000 Units by mouth daily.     metoprolol tartrate (LOPRESSOR) 50 MG tablet TAKE 1 TABLET BY MOUTH  TWICE DAILY 180 tablet 1   olmesartan (BENICAR) 40 MG tablet Take 40 mg by mouth daily.     vitamin B-12 (CYANOCOBALAMIN) 1000 MCG tablet Take 1,000 mcg by mouth daily.     zinc gluconate 50 MG tablet Take 50 mg by mouth daily.     acetaminophen (TYLENOL) 500 MG tablet Take 1 tablet (500 mg total) by mouth every 6 (six) hours as needed. (Patient not taking: Reported on 05/22/2021) 50 tablet 0   Polyethyl Glycol-Propyl Glycol (SYSTANE OP) Place 1 drop into both eyes daily as needed (dry eyes).  (Patient not taking: Reported on 05/22/2021)     No facility-administered medications prior to visit.     Allergies:   Codeine, Darvocet [propoxyphene n-acetaminophen], Percocet [oxycodone-acetaminophen], and Epinephrine   Social History   Socioeconomic History   Marital status: Widowed    Spouse  name: Not on file   Number of children: Not on file   Years of education: Not on file   Highest education level: Not on file  Occupational History   Not on file  Tobacco Use   Smoking status: Never   Smokeless tobacco: Never  Vaping Use   Vaping Use: Never used  Substance and Sexual Activity   Alcohol use: No   Drug use: No   Sexual activity: Never  Other Topics Concern   Not on file  Social History Narrative   Not on file   Social Determinants of Health   Financial Resource Strain: Not on file  Food Insecurity: Not on file  Transportation Needs: Not on file  Physical Activity: Not on file  Stress: Not on file  Social Connections: Not on file     Family History:  The patient's family history includes Heart disease in her father and mother.   ROS:   Please see the history of present illness.    ROS All other  systems are reviewed and are negative  PHYSICAL EXAM:   VS:  BP (!) 145/81 (BP Location: Left Arm, Patient Position: Sitting, Cuff Size: Normal)   Pulse 71   Ht 5' 4.5" (1.638 m)   Wt 120 lb 3.2 oz (54.5 kg)   SpO2 100%   BMI 20.31 kg/m      General: Alert, oriented x3, no distress, very slender.  Appears younger than stated age. Head: no evidence of trauma, PERRL, EOMI, no exophtalmos or lid lag, no myxedema, no xanthelasma; normal ears, nose and oropharynx Neck: normal jugular venous pulsations and no hepatojugular reflux; brisk carotid pulses without delay and no carotid bruits Chest: clear to auscultation, no signs of consolidation by percussion or palpation, normal fremitus, symmetrical and full respiratory excursions Cardiovascular: normal position and quality of the apical impulse, regular rhythm, normal first and second heart sounds, no murmurs, rubs or gallops Abdomen: no tenderness or distention, no masses by palpation, no abnormal pulsatility or arterial bruits, normal bowel sounds, no hepatosplenomegaly Extremities: no clubbing, cyanosis or edema;  2+ radial, ulnar and brachial pulses bilaterally; 2+ right femoral, posterior tibial and dorsalis pedis pulses; 2+ left femoral, posterior tibial and dorsalis pedis pulses; no subclavian or femoral bruits Neurological: grossly nonfocal Psych: Normal mood and affect   Wt Readings from Last 3 Encounters:  05/22/21 120 lb 3.2 oz (54.5 kg)  05/16/20 122 lb (55.3 kg)  01/25/20 119 lb (54 kg)    Studies/Labs Reviewed:   EKG:  EKG is ordered today.  Personally reviewed it is very similar to previous tracings.  Shows AV sequential pacing with a distinct positive R wave in lead V1 and a broad QRS at 166 ms.  QTc 458 ms.    1120 03/2020 Cholesterol 167, HDL 63, LDL 93, triglycerides 57 Potassium 4.4, TSH 3.09, creatinine 0.9  ASSESSMENT:    1. Complete heart block ([facility])   2. Pacemaker - dual-chamber Medtronic   3. Paroxysmal atrial fibrillation (HCC)   4. Essential hypertension    PLAN:  In order of problems listed above:  CHB: No underlying escape rhythm.  Pacemaker dependent. PM: Normal pacemaker function.  Positive R wave in lead V1 suggests that her ventricular lead may be located in the coronary sinus/middle cardiac vein. This is confirmed by one of her old chest x-rays. Remote download every 3 months, yearly office visits.  AFib: As we have discussed before, I remain concerned that she is at increased risk of embolic stroke because of the increased frequency of atrial fibrillation.  She has been resistant to this suggestion of anticoagulant therapy, since she is very worried about easy bleeding especially epistaxis.  I am not sure that a watchman device would make a lot of sense at age 48, since the finite risk of procedure related complications may not be matched by reduction and long-term risk of events with a limited time horizon.  She will continue taking aspirin, at least.  I recommended anticoagulants, but she again declines.  I reiterated the importance of promptly reporting any focal  neurological complaints, no matter how brief.  CHADSVasc 4 (age, gender, hypertension). HTN: Tolerate systolic blood pressure in the 140-150 range, due to her previous problems with orthostatic hypotension.   Medication Adjustments/Labs and Tests Ordered: Current medicines are reviewed at length with the patient today.  Concerns regarding medicines are outlined above.  Medication changes, Labs and Tests ordered today are listed in the Patient Instructions below. Patient Instructions  Medication Instructions:  No changes *If  you need a refill on your cardiac medications before your next appointment, please call your pharmacy*   Lab Work: None ordered If you have labs (blood work) drawn today and your tests are completely normal, you will receive your results only by: [facility] (if you have MyChart) OR A paper copy in the mail If you have any lab test that is abnormal or we need to change your treatment, we will call you to review the results.   Testing/Procedures: None ordered   Follow-Up: At [facility], you and your health needs are our priority.  As part of our continuing mission to provide you with exceptional heart care, we have created designated Provider Care Teams.  These Care Teams include your primary Cardiologist (physician) and Advanced Practice Providers (APPs -  Physician Assistants and Nurse Practitioners) who all work together to provide you with the care you need, when you need it.  We recommend signing up for the patient portal called "MyChart".  Sign up information is provided on this After Visit Summary.  MyChart is used to connect with patients for Virtual Visits (Telemedicine).  Patients are able to view lab/test results, encounter notes, upcoming appointments, etc.  Non-urgent messages can be sent to your provider as well.   To learn more about what you can do with MyChart, go to [email or web address].    Your next appointment:   12 month(s)  The  format for your next appointment:   In Person  Provider:   Sanda Klein, MD        Signed, Sanda Klein, MD  05/23/2021 7:21 PM    [address], [place], [place]  [address] Phone: [phone]; Fax: [phone]

## 2021-05-22 NOTE — Patient Instructions (Signed)

## 2021-06-13 ENCOUNTER — Ambulatory Visit (INDEPENDENT_AMBULATORY_CARE_PROVIDER_SITE_OTHER): Payer: Medicare Other

## 2021-06-13 DIAGNOSIS — I442 Atrioventricular block, complete: Secondary | ICD-10-CM | POA: Diagnosis not present

## 2021-06-13 LAB — CUP PACEART REMOTE DEVICE CHECK
Battery Impedance: 726 Ohm
Battery Remaining Longevity: 71 mo
Battery Voltage: 2.78 V
Brady Statistic AP VP Percent: 96 %
Brady Statistic AP VS Percent: 0 %
Brady Statistic AS VP Percent: 4 %
Brady Statistic AS VS Percent: 0 %
Date Time Interrogation Session: 20221206123333
Implantable Lead Implant Date: 20000522
Implantable Lead Implant Date: 20000526
Implantable Lead Location: 753859
Implantable Lead Location: 753860
Implantable Pulse Generator Implant Date: 20170328
Lead Channel Impedance Value: 456 Ohm
Lead Channel Impedance Value: 706 Ohm
Lead Channel Pacing Threshold Amplitude: 0.625 V
Lead Channel Pacing Threshold Amplitude: 0.625 V
Lead Channel Pacing Threshold Pulse Width: 0.4 ms
Lead Channel Pacing Threshold Pulse Width: 0.4 ms
Lead Channel Setting Pacing Amplitude: 1.5 V
Lead Channel Setting Pacing Amplitude: 2 V
Lead Channel Setting Pacing Pulse Width: 0.46 ms
Lead Channel Setting Sensing Sensitivity: 4 mV

## 2021-06-14 ENCOUNTER — Other Ambulatory Visit: Payer: Self-pay | Admitting: Cardiovascular Disease

## 2021-06-22 NOTE — Progress Notes (Signed)
Remote pacemaker transmission.   

## 2021-09-12 ENCOUNTER — Ambulatory Visit (INDEPENDENT_AMBULATORY_CARE_PROVIDER_SITE_OTHER): Payer: Medicare Other

## 2021-09-12 DIAGNOSIS — I442 Atrioventricular block, complete: Secondary | ICD-10-CM | POA: Diagnosis not present

## 2021-09-12 LAB — CUP PACEART REMOTE DEVICE CHECK
Battery Impedance: 802 Ohm
Battery Remaining Longevity: 67 mo
Battery Voltage: 2.78 V
Brady Statistic AP VP Percent: 91 %
Brady Statistic AP VS Percent: 0 %
Brady Statistic AS VP Percent: 9 %
Brady Statistic AS VS Percent: 0 %
Date Time Interrogation Session: 20230307093416
Implantable Lead Implant Date: 20000522
Implantable Lead Implant Date: 20000526
Implantable Lead Location: 753859
Implantable Lead Location: 753860
Implantable Pulse Generator Implant Date: 20170328
Lead Channel Impedance Value: 437 Ohm
Lead Channel Impedance Value: 691 Ohm
Lead Channel Pacing Threshold Amplitude: 0.625 V
Lead Channel Pacing Threshold Amplitude: 0.875 V
Lead Channel Pacing Threshold Pulse Width: 0.4 ms
Lead Channel Pacing Threshold Pulse Width: 0.4 ms
Lead Channel Setting Pacing Amplitude: 1.75 V
Lead Channel Setting Pacing Amplitude: 2 V
Lead Channel Setting Pacing Pulse Width: 0.4 ms
Lead Channel Setting Sensing Sensitivity: 4 mV

## 2021-09-25 NOTE — Progress Notes (Signed)
Remote pacemaker transmission.   

## 2021-12-12 ENCOUNTER — Ambulatory Visit (INDEPENDENT_AMBULATORY_CARE_PROVIDER_SITE_OTHER): Payer: Medicare Other

## 2021-12-12 DIAGNOSIS — I442 Atrioventricular block, complete: Secondary | ICD-10-CM | POA: Diagnosis not present

## 2021-12-12 LAB — CUP PACEART REMOTE DEVICE CHECK
Battery Impedance: 828 Ohm
Battery Remaining Longevity: 65 mo
Battery Voltage: 2.78 V
Brady Statistic AP VP Percent: 93 %
Brady Statistic AP VS Percent: 0 %
Brady Statistic AS VP Percent: 7 %
Brady Statistic AS VS Percent: 0 %
Date Time Interrogation Session: 20230606110017
Implantable Lead Implant Date: 20000522
Implantable Lead Implant Date: 20000526
Implantable Lead Location: 753859
Implantable Lead Location: 753860
Implantable Pulse Generator Implant Date: 20170328
Lead Channel Impedance Value: 431 Ohm
Lead Channel Impedance Value: 708 Ohm
Lead Channel Pacing Threshold Amplitude: 0.75 V
Lead Channel Pacing Threshold Amplitude: 0.875 V
Lead Channel Pacing Threshold Pulse Width: 0.4 ms
Lead Channel Pacing Threshold Pulse Width: 0.4 ms
Lead Channel Setting Pacing Amplitude: 1.875
Lead Channel Setting Pacing Amplitude: 2 V
Lead Channel Setting Pacing Pulse Width: 0.4 ms
Lead Channel Setting Sensing Sensitivity: 4 mV

## 2021-12-27 NOTE — Progress Notes (Signed)
Remote pacemaker transmission.   

## 2022-03-13 ENCOUNTER — Ambulatory Visit (INDEPENDENT_AMBULATORY_CARE_PROVIDER_SITE_OTHER): Payer: Medicare Other

## 2022-03-13 DIAGNOSIS — I442 Atrioventricular block, complete: Secondary | ICD-10-CM

## 2022-03-16 LAB — CUP PACEART REMOTE DEVICE CHECK
Battery Impedance: 907 Ohm
Battery Remaining Longevity: 61 mo
Battery Voltage: 2.78 V
Brady Statistic AP VP Percent: 93 %
Brady Statistic AP VS Percent: 0 %
Brady Statistic AS VP Percent: 7 %
Brady Statistic AS VS Percent: 0 %
Date Time Interrogation Session: 20230905124305
Implantable Lead Implant Date: 20000522
Implantable Lead Implant Date: 20000526
Implantable Lead Location: 753859
Implantable Lead Location: 753860
Implantable Pulse Generator Implant Date: 20170328
Lead Channel Impedance Value: 437 Ohm
Lead Channel Impedance Value: 706 Ohm
Lead Channel Pacing Threshold Amplitude: 0.75 V
Lead Channel Pacing Threshold Amplitude: 1 V
Lead Channel Pacing Threshold Pulse Width: 0.4 ms
Lead Channel Pacing Threshold Pulse Width: 0.4 ms
Lead Channel Setting Pacing Amplitude: 2 V
Lead Channel Setting Pacing Amplitude: 2 V
Lead Channel Setting Pacing Pulse Width: 0.4 ms
Lead Channel Setting Sensing Sensitivity: 4 mV

## 2022-03-19 ENCOUNTER — Ambulatory Visit: Payer: Medicare Other | Admitting: Podiatry

## 2022-03-19 ENCOUNTER — Ambulatory Visit: Payer: Medicare Other

## 2022-03-19 ENCOUNTER — Encounter: Payer: Self-pay | Admitting: Podiatry

## 2022-03-19 DIAGNOSIS — M21619 Bunion of unspecified foot: Secondary | ICD-10-CM | POA: Diagnosis not present

## 2022-03-19 NOTE — Progress Notes (Signed)
  Subjective:  Patient ID: Kathryn [name], female    DOB: [birth year],  MRN: [id number]  Chief Complaint  Patient presents with   Bunions    new pt re-established-bunion R foot, had sx in past by Dr. Tomma Rakers req-Dr. Crist Infante refer. Is not painful most of the time. "Just wanted to make sure it's alright"    88 y.o. female presents with the above complaint. History confirmed with patient.   Objective:  Physical Exam: warm, good capillary refill, no trophic changes or ulcerative lesions, normal DP and PT pulses, normal sensory exam, and slight recurrent hallux valgus deformity nontender nonpainful good range of motion of the joint  Assessment:   1. Bunion      Plan:  Patient was evaluated and treated and all questions answered.  Discussed risk of possibility of recurrence.  Currently this is relatively asymptomatic.  I advised her to continue to monitor and wear accommodative shoe gear.  I do not think surgery at her age would be advisable.  A silicone toe spacer was dispensed  Return if symptoms worsen or fail to improve.

## 2022-04-05 NOTE — Progress Notes (Signed)
Remote pacemaker transmission.   

## 2022-05-10 ENCOUNTER — Other Ambulatory Visit: Payer: Self-pay | Admitting: Cardiovascular Disease

## 2022-06-12 ENCOUNTER — Ambulatory Visit (INDEPENDENT_AMBULATORY_CARE_PROVIDER_SITE_OTHER): Payer: Medicare Other

## 2022-06-12 DIAGNOSIS — I442 Atrioventricular block, complete: Secondary | ICD-10-CM | POA: Diagnosis not present

## 2022-06-12 LAB — CUP PACEART REMOTE DEVICE CHECK
Battery Impedance: 1036 Ohm
Battery Remaining Longevity: 56 mo
Battery Voltage: 2.77 V
Brady Statistic AP VP Percent: 93 %
Brady Statistic AP VS Percent: 0 %
Brady Statistic AS VP Percent: 7 %
Brady Statistic AS VS Percent: 0 %
Date Time Interrogation Session: 20231205095138
Implantable Lead Connection Status: 753985
Implantable Lead Connection Status: 753985
Implantable Lead Implant Date: 20000522
Implantable Lead Implant Date: 20000526
Implantable Lead Location: 753859
Implantable Lead Location: 753860
Implantable Pulse Generator Implant Date: 20170328
Lead Channel Impedance Value: 449 Ohm
Lead Channel Impedance Value: 721 Ohm
Lead Channel Pacing Threshold Amplitude: 0.75 V
Lead Channel Pacing Threshold Amplitude: 1 V
Lead Channel Pacing Threshold Pulse Width: 0.4 ms
Lead Channel Pacing Threshold Pulse Width: 0.4 ms
Lead Channel Setting Pacing Amplitude: 2 V
Lead Channel Setting Pacing Amplitude: 2 V
Lead Channel Setting Pacing Pulse Width: 0.4 ms
Lead Channel Setting Sensing Sensitivity: 4 mV
Zone Setting Status: 755011
Zone Setting Status: 755011

## 2022-06-13 ENCOUNTER — Other Ambulatory Visit: Payer: Self-pay | Admitting: Cardiovascular Disease

## 2022-07-11 NOTE — Progress Notes (Signed)
Remote pacemaker transmission.   

## 2022-07-12 ENCOUNTER — Encounter: Payer: Self-pay | Admitting: Cardiovascular Disease

## 2022-07-12 ENCOUNTER — Ambulatory Visit: Payer: Medicare Other | Attending: Cardiovascular Disease | Admitting: Cardiovascular Disease

## 2022-07-12 VITALS — BP 139/81 | HR 83 | Ht 64.5 in | Wt 117.4 lb

## 2022-07-12 DIAGNOSIS — I480 Paroxysmal atrial fibrillation: Secondary | ICD-10-CM

## 2022-07-12 DIAGNOSIS — H00024 Hordeolum internum left upper eyelid: Secondary | ICD-10-CM

## 2022-07-12 DIAGNOSIS — I10 Essential (primary) hypertension: Secondary | ICD-10-CM

## 2022-07-12 DIAGNOSIS — I442 Atrioventricular block, complete: Secondary | ICD-10-CM

## 2022-07-12 DIAGNOSIS — Z950 Presence of cardiac pacemaker: Secondary | ICD-10-CM | POA: Diagnosis not present

## 2022-07-12 MED ORDER — ERYTHROMYCIN 5 MG/GM OP OINT: 1.0000 | TOPICAL_OINTMENT | Freq: Three times a day (TID) | OPHTHALMIC | 0 refills | Status: DC

## 2022-07-12 NOTE — Progress Notes (Signed)
Patient ID: Kathryn [name], female   DOB: 12/24/1927, 89 y.o.   MRN: [id number]    Cardiology Office Note    Date:  07/12/2022   ID:  Kathryn [name], DOB May 08, 1928, MRN [id number]  PCP:  Crist Infante, MD  Cardiologist:   Sanda Klein, MD   Chief Complaint  Patient presents with   Atrial Fibrillation    History of Present Illness:  Kathryn [name] is a 89 y.o. female with complete heart block and history of paroxysmal atrial fibrillation, HTN. She underwent a generator change out in 2017.   She has occasional orthostatic dizziness that is brief.  She has not had falls.  She denies any bleeding problems other than occasional brief nosebleeds, but has very fragile skin and easy bruising, especially on her shins.  Wounds heal slowly.  The patient specifically denies any chest pain at rest or with exertion, dyspnea at rest or with exertion, orthopnea, paroxysmal nocturnal dyspnea, syncope, palpitations, focal neurological deficits, intermittent claudication, lower extremity edema, unexplained weight gain, cough, hemoptysis or wheezing.   Pacemaker interrogation shows that the burden of atrial fibrillation has increased to 3.4%.  The episodes last between 6 and 19 hours.  They are consistently asymptomatic.  Rate control is not an issue due to complete heart block.  I  When not in atrial fibrillation she has almost always a paced, V paced rhythm (atrial paced 93%, ventricular paced 100%) with good heart rate histogram distribution.  Estimated generator longevity is 5 years and lead parameters are all excellent.  She has declined oral anticoagulants, despite high embolic risk (CHADSVasc 4: age 41, gender, HTN)   She does not have a history of stroke or TIA or other embolic events.    As at previous appointments when she first checked and her blood pressure was high 184/82 but checked 50 minutes later was down to 139/81.    She has complete heart block and a dual-chamber Medtronic  pacemaker which was implanted in 2000, with a generator change out in 2008. In 2000 she presented with what appeared to be an acute anterior wall myocardial infarction following a bradycardia-related ventricular fibrillation episode during treadmill stress testing as an outpatient. She underwent emergency cardiac catheterization and was found to have normal coronary arteries with a Takotsubo cardiomyopathy. She has treated systemic hypertension. She has very infrequent but lengthy episodes of asymptomatic paroxysmal atrial fibrillation.  Past Medical History:  Diagnosis Date   Arthritis    HANDS   Bradycardia    DVT (deep venous thrombosis) ([facility]) 2010   Dysrhythmia    HX OF PAROXYSMAL ATRIAL FIB- TAKES ASPIRIN FOR BLOOD THINNER   GERD (gastroesophageal reflux disease)    Hypertension    Peripheral vascular disease (HCC)    Blood clot behind Right knee   Presence of permanent cardiac pacemaker    COMPLETE HEART BLOCK--DR. [place] Lesure     Past Surgical History:  Procedure Laterality Date   ABDOMINAL HYSTERECTOMY     APPENDECTOMY     AUGMENTATION MAMMAPLASTY Bilateral    BREAST ENHANCEMENT SURGERY     BREAST IMPLANT REMOVAL Bilateral 08/28/2017   Procedure: REMOVAL BREAST IMPLANTS BILATERAL;  Surgeon: Wallace Going, DO;  Location: [facility];  Service: Plastics;  Laterality: Bilateral;   CATARACT EXTRACTION, BILATERAL     CHOLECYSTECTOMY     COLONOSCOPY N/A 03/05/2014   Procedure: COLONOSCOPY;  Surgeon: Shann Medal, MD;  Location: [facility];  Service: General;  Laterality: N/A;   COLOSTOMY  CLOSURE N/A 05/21/2014   Procedure: LAPAROSCOPIC ASSISTED CLOSURE OF COLOSTOMY AND REPAIR OF TRANSVERSE COLON AND ENTEROTOMY;  Surgeon: Alphonsa Overall, MD;  Location: [facility];  Service: General;  Laterality: N/A;   EP IMPLANTABLE DEVICE N/A 10/04/2015   Procedure: PPM/BIV PPM Generator Changeout;  Surgeon: Sanda Klein, MD;  Location: [facility];  Service: Cardiovascular;   Laterality: N/A;   LAPAROTOMY N/A 08/27/2013   Procedure: LAPAROSCOPY CONVERTED TO OPEN LAPAROTOMY WITH SIGMOID COLECTOMY AND COLOSTOMY;  Surgeon: Shann Medal, MD;  Location: [facility];  Service: General;  Laterality: N/A;   OPEN REDUCTION INTERNAL FIXATION (ORIF) DISTAL RADIAL FRACTURE Left 02/15/2015   Procedure: OPEN TREATMENT OF LEFT DISTAL RADIUS FRACTURE;  Surgeon: Milly Jakob, MD;  Location: [facility];  Service: Orthopedics;  Laterality: Left;   PACEMAKER INSERTION  12/02/1998   medtronic   PERMANENT PACEMAKER GENERATOR CHANGE  02/28/2007   Medtronic Adapta   TONSILLECTOMY     US ECHOCARDIOGRAPHY  10/09/2011   mild LAE, mild MR,mild to mod TR    Outpatient Medications Prior to Visit  Medication Sig Dispense Refill   Ascorbic Acid (VITAMIN C PO) Take 1 tablet by mouth daily.     aspirin EC 81 MG tablet Take 81 mg by mouth every morning.      Calcium 200 MG TABS Take 200 mg by mouth 2 (two) times daily.     cholecalciferol (VITAMIN D) 1000 units tablet Take 1,000 Units by mouth daily.     CRANBERRY PO Take 1 capsule by mouth daily in the afternoon.     EVENING PRIMROSE OIL PO Take 1 capsule by mouth daily in the afternoon.     metoprolol tartrate (LOPRESSOR) 50 MG tablet TAKE 1 TABLET BY MOUTH TWICE  DAILY 120 tablet 5   olmesartan (BENICAR) 40 MG tablet Take 40 mg by mouth daily.     Omega 3 340 MG CPDR Take one tablet by mouth twice daily  but only occas. use in 2016 Oral     TART CHERRY PO Take 3 capsules by mouth daily in the afternoon. Take 1-3 tablets daily     vitamin B-12 (CYANOCOBALAMIN) 1000 MCG tablet Take 1,000 mcg by mouth daily.     zinc gluconate 50 MG tablet Take 50 mg by mouth daily.     acetaminophen (TYLENOL) 500 MG tablet Take 1 tablet (500 mg total) by mouth every 6 (six) hours as needed. (Patient not taking: Reported on 07/12/2022) 50 tablet 0   calcium carbonate (SUPER CALCIUM) 1500 (600 Ca) MG TABS tablet Take one tablet by mouth once daily Oral  (Patient not taking: Reported on 07/12/2022)     mupirocin ointment (BACTROBAN) 2 % 1 application Externally Twice a day for 30 days (Patient not taking: Reported on 07/12/2022)     Polyethyl Glycol-Propyl Glycol (SYSTANE OP) Place 1 drop into both eyes daily as needed (dry eyes).  (Patient not taking: Reported on 07/12/2022)     No facility-administered medications prior to visit.     Allergies:   Acetaminophen-codeine, Atorvastatin, Codeine, Darvocet [propoxyphene n-acetaminophen], Ibandronic acid, Iron, Ketamine, Levofloxacin, Percocet [oxycodone-acetaminophen], Procaine, Proton pump inhibitors, Valsartan, and Epinephrine   Social History   Socioeconomic History   Marital status: Widowed    Spouse name: Not on file   Number of children: Not on file   Years of education: Not on file   Highest education level: Not on file  Occupational History   Not on file  Tobacco Use  Smoking status: Never   Smokeless tobacco: Never  Vaping Use   Vaping Use: Never used  Substance and Sexual Activity   Alcohol use: No   Drug use: No   Sexual activity: Never  Other Topics Concern   Not on file  Social History Narrative   Not on file   Social Determinants of Health   Financial Resource Strain: Not on file  Food Insecurity: Not on file  Transportation Needs: Not on file  Physical Activity: Not on file  Stress: Not on file  Social Connections: Not on file     Family History:  The patient's family history includes Heart disease in her father and mother.   ROS:   Please see the history of present illness.    ROS All other systems are reviewed and are negative  PHYSICAL EXAM:   VS:  BP 139/81   Pulse 83   Ht 5' 4.5" (1.638 m)   Wt 117 lb 6.4 oz (53.3 kg)   SpO2 99%   BMI 19.84 kg/m       General: Alert, oriented x3, no distress, very slender.  Appears younger than stated age.  No fever subclavian pacemaker site Head: no evidence of trauma, PERRL, EOMI, no exophtalmos or lid lag,  no myxedema, no xanthelasma; normal ears, nose and oropharynx Neck: normal jugular venous pulsations and no hepatojugular reflux; brisk carotid pulses without delay and no carotid bruits Chest: clear to auscultation, no signs of consolidation by percussion or palpation, normal fremitus, symmetrical and full respiratory excursions Cardiovascular: normal position and quality of the apical impulse, regular rhythm, normal first and second heart sounds, no murmurs, rubs or gallops Abdomen: no tenderness or distention, no masses by palpation, no abnormal pulsatility or arterial bruits, normal bowel sounds, no hepatosplenomegaly Extremities: Several superficial ecchymoses on both shins, no clubbing, cyanosis or edema; 2+ radial, ulnar and brachial pulses bilaterally; 2+ right femoral, posterior tibial and dorsalis pedis pulses; 2+ left femoral, posterior tibial and dorsalis pedis pulses; no subclavian or femoral bruits Neurological: grossly nonfocal Psych: Normal mood and affect    Wt Readings from Last 3 Encounters:  07/12/22 117 lb 6.4 oz (53.3 kg)  05/22/21 120 lb 3.2 oz (54.5 kg)  05/16/20 122 lb (55.3 kg)    Studies/Labs Reviewed:   EKG:  EKG is ordered today.  It shows AV sequential pacing with a very distinct positive R wave in lead V1 and a broad QRS 76 ms, QTc 495 ms  1120 03/2020 Cholesterol 167, HDL 63, LDL 93, triglycerides 57 Potassium 4.4, TSH 3.09, creatinine 0.9  08/09/2021 Cholesterol 175, HDL 49, LDL 115, triglycerides 53   ASSESSMENT:    1. CHB (complete heart block) (HCC)   2. Pacemaker   3. Paroxysmal atrial fibrillation (HCC)   4. Essential hypertension   5. Hordeolum internum of left upper eyelid     PLAN:  In order of problems listed above:  CHB: There is no underlying escape rhythm.  She is pacemaker dependent. PM: Normal device function, continue remote downloads every 3 months.  Positive R wave in lead V1 suggests that her ventricular lead may be located in  the coronary sinus/middle cardiac vein. This is confirmed by one of her old chest x-rays.  AFib: The prevalence of paroxysmal atrial fibrillation is slowly increasing, now up to 3-4%.  Remains completely asymptomatic.  As we have discussed before, I remain concerned that she is at increased risk of embolic stroke because of the increased frequency of atrial  fibrillation.  She has been resistant to this suggestion of anticoagulant therapy, since she is very worried about easy bleeding especially epistaxis.  I am not sure that a watchman device would make a lot of sense at age 23, since the finite risk of procedure related complications may not be matched by reduction and long-term risk of events with a limited time horizon.  She will continue taking aspirin, at least.  I recommended anticoagulants, but she again declines.  I reiterated the importance of promptly reporting any focal neurological complaints, no matter how brief.  CHADSVasc 4 (age 42, gender, hypertension). HTN: Adequate control.  Tolerate systolic blood pressure in the 140-150 range, due to her previous problems with orthostatic hypotension. Left eye hordeolum: She has been trying warm compresses and these have not helped yet.  Will give her prescription for erythromycin ophthalmic ointment.   Medication Adjustments/Labs and Tests Ordered: Current medicines are reviewed at length with the patient today.  Concerns regarding medicines are outlined above.  Medication changes, Labs and Tests ordered today are listed in the Patient Instructions below. Patient Instructions  Medication Instructions:  NO CHANGES  *If you need a refill on your cardiac medications before your next appointment, please call your pharmacy*    Follow-Up: At [facility], you and your health needs are our priority.  As part of our continuing mission to provide you with exceptional heart care, we have created designated Provider Care Teams.  These Care Teams  include your primary Cardiologist (physician) and Advanced Practice Providers (APPs -  Physician Assistants and Nurse Practitioners) who all work together to provide you with the care you need, when you need it.  We recommend signing up for the patient portal called "MyChart".  Sign up information is provided on this After Visit Summary.  MyChart is used to connect with patients for Virtual Visits (Telemedicine).  Patients are able to view lab/test results, encounter notes, upcoming appointments, etc.  Non-urgent messages can be sent to your provider as well.   To learn more about what you can do with MyChart, go to [email or web address].    Your next appointment:    1 year with Dr. Sallyanne Kuster   Try WARM COMPRESSES for stye relief. Should resolve on its own            Signed, Sanda Klein, MD  07/12/2022 11:34 AM    [facility] [address], [place], [place]  [address] Phone: [phone]; Fax: [phone]

## 2022-07-12 NOTE — Patient Instructions (Addendum)
Medication Instructions:  NO CHANGES  *If you need a refill on your cardiac medications before your next appointment, please call your pharmacy*    Follow-Up: At [facility], you and your health needs are our priority.  As part of our continuing mission to provide you with exceptional heart care, we have created designated Provider Care Teams.  These Care Teams include your primary Cardiologist (physician) and Advanced Practice Providers (APPs -  Physician Assistants and Nurse Practitioners) who all work together to provide you with the care you need, when you need it.  We recommend signing up for the patient portal called "MyChart".  Sign up information is provided on this After Visit Summary.  MyChart is used to connect with patients for Virtual Visits (Telemedicine).  Patients are able to view lab/test results, encounter notes, upcoming appointments, etc.  Non-urgent messages can be sent to your provider as well.   To learn more about what you can do with MyChart, go to [email or web address].    Your next appointment:    1 year with Dr. Sallyanne Kuster   Try WARM COMPRESSES for stye relief. Should resolve on its own

## 2022-07-26 ENCOUNTER — Encounter: Payer: Self-pay | Admitting: Cardiovascular Disease

## 2022-09-11 ENCOUNTER — Telehealth: Payer: Self-pay | Admitting: Cardiovascular Disease

## 2022-09-11 ENCOUNTER — Ambulatory Visit (INDEPENDENT_AMBULATORY_CARE_PROVIDER_SITE_OTHER): Payer: Medicare Other

## 2022-09-11 DIAGNOSIS — I442 Atrioventricular block, complete: Secondary | ICD-10-CM

## 2022-09-11 NOTE — Telephone Encounter (Signed)
I Spoke with the patient and answered all of her questions.

## 2022-09-11 NOTE — Telephone Encounter (Signed)
Discussed with patient. Forwarding to [place] to assist.

## 2022-09-11 NOTE — Telephone Encounter (Signed)
Patient states she normally receives a letter confirming when her transmissions have been received, but she never received a letter for 06/12/22 transmission and she would like to know if it was received. Called device clinic 2x but it went straight to voicemail.

## 2022-09-14 LAB — CUP PACEART REMOTE DEVICE CHECK
Battery Impedance: 1144 Ohm
Battery Remaining Longevity: 53 mo
Battery Voltage: 2.78 V
Brady Statistic AP VP Percent: 91 %
Brady Statistic AP VS Percent: 0 %
Brady Statistic AS VP Percent: 8 %
Brady Statistic AS VS Percent: 0 %
Date Time Interrogation Session: 20240307115431
Implantable Lead Connection Status: 753985
Implantable Lead Connection Status: 753985
Implantable Lead Implant Date: 20000522
Implantable Lead Implant Date: 20000526
Implantable Lead Location: 753859
Implantable Lead Location: 753860
Implantable Pulse Generator Implant Date: 20170328
Lead Channel Impedance Value: 426 Ohm
Lead Channel Impedance Value: 737 Ohm
Lead Channel Pacing Threshold Amplitude: 0.75 V
Lead Channel Pacing Threshold Amplitude: 1 V
Lead Channel Pacing Threshold Pulse Width: 0.4 ms
Lead Channel Pacing Threshold Pulse Width: 0.4 ms
Lead Channel Setting Pacing Amplitude: 2 V
Lead Channel Setting Pacing Amplitude: 2 V
Lead Channel Setting Pacing Pulse Width: 0.4 ms
Lead Channel Setting Sensing Sensitivity: 4 mV
Zone Setting Status: 755011
Zone Setting Status: 755011

## 2022-10-17 NOTE — Progress Notes (Signed)
Remote pacemaker transmission.   

## 2022-12-11 ENCOUNTER — Ambulatory Visit (INDEPENDENT_AMBULATORY_CARE_PROVIDER_SITE_OTHER): Payer: Medicare Other

## 2022-12-11 DIAGNOSIS — I442 Atrioventricular block, complete: Secondary | ICD-10-CM

## 2022-12-11 LAB — CUP PACEART REMOTE DEVICE CHECK
Battery Impedance: 1223 Ohm
Battery Remaining Longevity: 51 mo
Battery Voltage: 2.77 V
Brady Statistic AP VP Percent: 92 %
Brady Statistic AP VS Percent: 0 %
Brady Statistic AS VP Percent: 8 %
Brady Statistic AS VS Percent: 0 %
Date Time Interrogation Session: 20240604113344
Implantable Lead Connection Status: 753985
Implantable Lead Connection Status: 753985
Implantable Lead Implant Date: 20000522
Implantable Lead Implant Date: 20000526
Implantable Lead Location: 753859
Implantable Lead Location: 753860
Implantable Pulse Generator Implant Date: 20170328
Lead Channel Impedance Value: 432 Ohm
Lead Channel Impedance Value: 711 Ohm
Lead Channel Pacing Threshold Amplitude: 0.75 V
Lead Channel Pacing Threshold Amplitude: 0.875 V
Lead Channel Pacing Threshold Pulse Width: 0.4 ms
Lead Channel Pacing Threshold Pulse Width: 0.4 ms
Lead Channel Setting Pacing Amplitude: 1.75 V
Lead Channel Setting Pacing Amplitude: 2 V
Lead Channel Setting Pacing Pulse Width: 0.4 ms
Lead Channel Setting Sensing Sensitivity: 4 mV
Zone Setting Status: 755011
Zone Setting Status: 755011

## 2023-01-03 NOTE — Progress Notes (Signed)
Remote pacemaker transmission.   

## 2023-03-04 ENCOUNTER — Other Ambulatory Visit: Payer: Self-pay | Admitting: Cardiovascular Disease

## 2023-03-12 ENCOUNTER — Ambulatory Visit (INDEPENDENT_AMBULATORY_CARE_PROVIDER_SITE_OTHER): Payer: Medicare Other

## 2023-03-12 DIAGNOSIS — I442 Atrioventricular block, complete: Secondary | ICD-10-CM

## 2023-03-13 LAB — CUP PACEART REMOTE DEVICE CHECK
Battery Impedance: 1363 Ohm
Battery Remaining Longevity: 47 mo
Battery Voltage: 2.77 V
Brady Statistic AP VP Percent: 93 %
Brady Statistic AP VS Percent: 0 %
Brady Statistic AS VP Percent: 7 %
Brady Statistic AS VS Percent: 0 %
Date Time Interrogation Session: 20240903101952
Implantable Lead Connection Status: 753985
Implantable Lead Connection Status: 753985
Implantable Lead Implant Date: 20000522
Implantable Lead Implant Date: 20000526
Implantable Lead Location: 753859
Implantable Lead Location: 753860
Implantable Pulse Generator Implant Date: 20170328
Lead Channel Impedance Value: 444 Ohm
Lead Channel Impedance Value: 727 Ohm
Lead Channel Pacing Threshold Amplitude: 0.75 V
Lead Channel Pacing Threshold Amplitude: 1 V
Lead Channel Pacing Threshold Pulse Width: 0.4 ms
Lead Channel Pacing Threshold Pulse Width: 0.4 ms
Lead Channel Setting Pacing Amplitude: 2 V
Lead Channel Setting Pacing Amplitude: 2 V
Lead Channel Setting Pacing Pulse Width: 0.4 ms
Lead Channel Setting Sensing Sensitivity: 4 mV
Zone Setting Status: 755011
Zone Setting Status: 755011

## 2023-03-21 NOTE — Progress Notes (Signed)
Remote pacemaker transmission.   

## 2023-06-11 ENCOUNTER — Ambulatory Visit (INDEPENDENT_AMBULATORY_CARE_PROVIDER_SITE_OTHER): Payer: Medicare Other

## 2023-06-11 DIAGNOSIS — I442 Atrioventricular block, complete: Secondary | ICD-10-CM

## 2023-06-12 LAB — CUP PACEART REMOTE DEVICE CHECK
Battery Impedance: 1474 Ohm
Battery Remaining Longevity: 43 mo
Battery Voltage: 2.77 V
Brady Statistic AP VP Percent: 93 %
Brady Statistic AP VS Percent: 0 %
Brady Statistic AS VP Percent: 7 %
Brady Statistic AS VS Percent: 0 %
Date Time Interrogation Session: 20241203103757
Implantable Lead Connection Status: 753985
Implantable Lead Connection Status: 753985
Implantable Lead Implant Date: 20000522
Implantable Lead Implant Date: 20000526
Implantable Lead Location: 753859
Implantable Lead Location: 753860
Implantable Pulse Generator Implant Date: 20170328
Lead Channel Impedance Value: 426 Ohm
Lead Channel Impedance Value: 684 Ohm
Lead Channel Pacing Threshold Amplitude: 0.75 V
Lead Channel Pacing Threshold Amplitude: 1 V
Lead Channel Pacing Threshold Pulse Width: 0.4 ms
Lead Channel Pacing Threshold Pulse Width: 0.4 ms
Lead Channel Setting Pacing Amplitude: 2 V
Lead Channel Setting Pacing Amplitude: 2 V
Lead Channel Setting Pacing Pulse Width: 0.4 ms
Lead Channel Setting Sensing Sensitivity: 4 mV
Zone Setting Status: 755011
Zone Setting Status: 755011

## 2023-08-01 ENCOUNTER — Encounter: Payer: Self-pay | Admitting: Cardiovascular Disease

## 2023-08-01 ENCOUNTER — Ambulatory Visit: Payer: Medicare Other | Attending: Cardiovascular Disease | Admitting: Cardiovascular Disease

## 2023-08-01 VITALS — BP 150/91 | HR 70 | Ht 64.5 in | Wt 121.4 lb

## 2023-08-01 DIAGNOSIS — I951 Orthostatic hypotension: Secondary | ICD-10-CM

## 2023-08-01 DIAGNOSIS — I480 Paroxysmal atrial fibrillation: Secondary | ICD-10-CM | POA: Diagnosis not present

## 2023-08-01 DIAGNOSIS — I10 Essential (primary) hypertension: Secondary | ICD-10-CM | POA: Diagnosis not present

## 2023-08-01 DIAGNOSIS — I442 Atrioventricular block, complete: Secondary | ICD-10-CM

## 2023-08-01 DIAGNOSIS — Z950 Presence of cardiac pacemaker: Secondary | ICD-10-CM | POA: Diagnosis not present

## 2023-08-01 MED ORDER — CARVEDILOL 12.5 MG PO TABS: 12.5000 mg | ORAL_TABLET | Freq: Two times a day (BID) | ORAL | 3 refills | Status: DC

## 2023-08-01 NOTE — Patient Instructions (Signed)
Medication Instructions:  Stop Metoprolol START CARVEDILOL 12.5 MG TWICE A DAY *If you need a refill on your cardiac medications before your next appointment, please call your pharmacy*  SEND IN A BP LOG IN ONE WEEK   Follow-Up: At [facility], you and your health needs are our priority.  As part of our continuing mission to provide you with exceptional heart care, we have created designated Provider Care Teams.  These Care Teams include your primary Cardiologist (physician) and Advanced Practice Providers (APPs -  Physician Assistants and Nurse Practitioners) who all work together to provide you with the care you need, when you need it.  We recommend signing up for the patient portal called "MyChart".  Sign up information is provided on this After Visit Summary.  MyChart is used to connect with patients for Virtual Visits (Telemedicine).  Patients are able to view lab/test results, encounter notes, upcoming appointments, etc.  Non-urgent messages can be sent to your provider as well.   To learn more about what you can do with MyChart, go to [email or web address].    Your next appointment:   1 year(s)  Provider:   Dr Royann Shivers

## 2023-08-01 NOTE — Progress Notes (Signed)
Patient ID: Kathryn [name], female   DOB: 07/16/1927, 89 y.o.   MRN: [id number]     Cardiology Office Note    Date:  08/01/2023   ID:  Kathryn [name], DOB 07/12/1927, MRN [id number]  PCP:  Rodrigo Ran, MD  Cardiologist:   Thurmon Fair, MD   Chief Complaint  Patient presents with   Pacemaker Check    History of Present Illness:  Kathryn [name] is a 89 y.o. female with complete heart block and history of paroxysmal atrial fibrillation, HTN. She underwent a generator change out in 2017 (Medtronic Adapta).   She had a fall in November when she tripped over a threshold on her wooden porch.  She injured her left calf and she still has some swelling in her leg from that event.  She had a stye in her right eye that led to some bleeding and has a little bit of ecchymosis in the right infraorbital area.  She has not had other serious neurological events.  She does not have shortness of breath or chest pain at rest or with activity and is not aware of any palpitations.  She has not had dizziness or syncope.  She denies any focal neurological deficits that would suggest stroke or TIA.  Interrogation of her pacemaker shows that her burden of atrial fibrillation continues to steadily increase and is now up to almost 11%.  She has been in atrial fibrillation for the last 8-9 days and is completely unaware of the arrhythmia.  When not in atrial fibrillation she has virtually 100% atrial pacing.  She has complete heart block and has 100% ventricular pacing.  Estimated generator longevity is 3.5 years.  Lead parameters are normal.  She continues to decline the use of oral anticoagulants.  We have also briefly reviewed the Watchman device as an alternative, although I am not sure this is the best intervention at her age. She does have increased embolic risk (CHADSVasc 4: age 80, gender, HTN)   She does not have a history of stroke or TIA or other embolic events.    As usual when she comes to  our office, her blood pressure was markedly elevated when she first checked in at 186/94.  It had come down 150/91 about 10 minutes later.  I had an office visit 06/04/2023 her blood pressure was normal at 118/70.  She has complete heart block and a dual-chamber Medtronic pacemaker which was implanted in 2000, with a generator change out in 2008. In 2000 she presented with what appeared to be an acute anterior wall myocardial infarction following a bradycardia-related ventricular fibrillation episode during treadmill stress testing as an outpatient. She underwent emergency cardiac catheterization and was found to have normal coronary arteries with a Takotsubo cardiomyopathy. She has treated systemic hypertension. She has a steadily increasing burden of asymptomatic paroxysmal atrial fibrillation, but she has declined oral anticoagulants repeatedly.  Past Medical History:  Diagnosis Date   Arthritis    HANDS   Bradycardia    DVT (deep venous thrombosis) (HCC) 2010   Dysrhythmia    HX OF PAROXYSMAL ATRIAL FIB- TAKES ASPIRIN FOR BLOOD THINNER   GERD (gastroesophageal reflux disease)    Hypertension    Peripheral vascular disease (HCC)    Blood clot behind Right knee   Presence of permanent cardiac pacemaker    COMPLETE HEART BLOCK--DR. Eann Cleland     Past Surgical History:  Procedure Laterality Date   ABDOMINAL HYSTERECTOMY     APPENDECTOMY  AUGMENTATION MAMMAPLASTY Bilateral    BREAST ENHANCEMENT SURGERY     BREAST IMPLANT REMOVAL Bilateral 08/28/2017   Procedure: REMOVAL BREAST IMPLANTS BILATERAL;  Surgeon: Peggye Form, DO;  Location: [facility];  Service: Plastics;  Laterality: Bilateral;   CATARACT EXTRACTION, BILATERAL     CHOLECYSTECTOMY     COLONOSCOPY N/A 03/05/2014   Procedure: COLONOSCOPY;  Surgeon: Kandis Cocking, MD;  Location: [facility];  Service: General;  Laterality: N/A;   COLOSTOMY CLOSURE N/A 05/21/2014   Procedure: LAPAROSCOPIC ASSISTED  CLOSURE OF COLOSTOMY AND REPAIR OF TRANSVERSE COLON AND ENTEROTOMY;  Surgeon: Ovidio Kin, MD;  Location: [facility];  Service: General;  Laterality: N/A;   EP IMPLANTABLE DEVICE N/A 10/04/2015   Procedure: PPM/BIV PPM Generator Changeout;  Surgeon: Thurmon Fair, MD;  Location: [facility];  Service: Cardiovascular;  Laterality: N/A;   LAPAROTOMY N/A 08/27/2013   Procedure: LAPAROSCOPY CONVERTED TO OPEN LAPAROTOMY WITH SIGMOID COLECTOMY AND COLOSTOMY;  Surgeon: Kandis Cocking, MD;  Location: [facility];  Service: General;  Laterality: N/A;   OPEN REDUCTION INTERNAL FIXATION (ORIF) DISTAL RADIAL FRACTURE Left 02/15/2015   Procedure: OPEN TREATMENT OF LEFT DISTAL RADIUS FRACTURE;  Surgeon: Mack Hook, MD;  Location:  SUR[facility]ERY CENTER;  Service: Orthopedics;  Laterality: Left;   PACEMAKER INSERTION  12/02/1998   medtronic   PERMANENT PACEMAKER GENERATOR CHANGE  02/28/2007   Medtronic Adapta   TONSILLECTOMY     US ECHOCARDIOGRAPHY  10/09/2011   mild LAE, mild MR,mild to mod TR    Outpatient Medications Prior to Visit  Medication Sig Dispense Refill   acetaminophen (TYLENOL) 500 MG tablet Take 1 tablet (500 mg total) by mouth every 6 (six) hours as needed. 50 tablet 0   Ascorbic Acid (VITAMIN C PO) Take 1 tablet by mouth daily.     aspirin EC 81 MG tablet Take 81 mg by mouth every morning.      Calcium 200 MG TABS Take 200 mg by mouth 2 (two) times daily.     calcium carbonate (SUPER CALCIUM) 1500 (600 Ca) MG TABS tablet      cholecalciferol (VITAMIN D) 1000 units tablet Take 1,000 Units by mouth daily.     CRANBERRY PO Take 1 capsule by mouth daily in the afternoon.     erythromycin ophthalmic ointment Place 1 Application into the left eye 3 (three) times daily. 3.5 g 0   EVENING PRIMROSE OIL PO Take 1 capsule by mouth daily in the afternoon.     mupirocin ointment (BACTROBAN) 2 %      olmesartan (BENICAR) 40 MG tablet Take 40 mg by mouth daily.     Omega 3 340 MG CPDR Take one tablet  by mouth twice daily  but only occas. use in 2016 Oral     Polyethyl Glycol-Propyl Glycol (SYSTANE OP) Place 1 drop into both eyes daily as needed (dry eyes).     TART CHERRY PO Take 3 capsules by mouth daily in the afternoon. Take 1-3 tablets daily     vitamin B-12 (CYANOCOBALAMIN) 1000 MCG tablet Take 1,000 mcg by mouth daily.     zinc gluconate 50 MG tablet Take 50 mg by mouth daily.     metoprolol tartrate (LOPRESSOR) 50 MG tablet TAKE 1 TABLET BY MOUTH TWICE  DAILY 200 tablet 1   No facility-administered medications prior to visit.     Allergies:   Acetaminophen-codeine, Atorvastatin, Codeine, Darvocet [propoxyphene n-acetaminophen], Ibandronic acid, Iron, Ketamine, Levofloxacin, Percocet [oxycodone-acetaminophen], Procaine, Proton  pump inhibitors, Valsartan, and Epinephrine   Social History   Socioeconomic History   Marital status: Widowed    Spouse name: Not on file   Number of children: Not on file   Years of education: Not on file   Highest education level: Not on file  Occupational History   Not on file  Tobacco Use   Smoking status: Never   Smokeless tobacco: Never  Vaping Use   Vaping status: Never Used  Substance and Sexual Activity   Alcohol use: No   Drug use: No   Sexual activity: Never  Other Topics Concern   Not on file  Social History Narrative   Not on file   Social Drivers of Health   Financial Resource Strain: Not on file  Food Insecurity: Not on file  Transportation Needs: Not on file  Physical Activity: Not on file  Stress: Not on file  Social Connections: Not on file     Family History:  The patient's family history includes Heart disease in her father and mother.   ROS:   Please see the history of present illness.    ROS All other systems are reviewed and are negative  PHYSICAL EXAM:   VS:  BP (!) 150/91   Pulse 70   Ht 5' 4.5" (1.638 m)   Wt 121 lb 6.4 oz (55.1 kg)   SpO2 98%   BMI 20.52 kg/m       General: Alert, oriented x3,  no distress, very slender.  Appears younger than stated age.  No fever subclavian pacemaker site Head: no evidence of trauma, PERRL, EOMI, no exophtalmos or lid lag, no myxedema, no xanthelasma; normal ears, nose and oropharynx Neck: normal jugular venous pulsations and no hepatojugular reflux; brisk carotid pulses without delay and no carotid bruits Chest: clear to auscultation, no signs of consolidation by percussion or palpation, normal fremitus, symmetrical and full respiratory excursions Cardiovascular: normal position and quality of the apical impulse, regular rhythm, normal first and second heart sounds, no murmurs, rubs or gallops Abdomen: no tenderness or distention, no masses by palpation, no abnormal pulsatility or arterial bruits, normal bowel sounds, no hepatosplenomegaly Extremities: Several superficial ecchymoses on both shins, no clubbing, cyanosis or edema; 2+ radial, ulnar and brachial pulses bilaterally; 2+ right femoral, posterior tibial and dorsalis pedis pulses; 2+ left femoral, posterior tibial and dorsalis pedis pulses; no subclavian or femoral bruits Neurological: grossly nonfocal Psych: Normal mood and affect    Wt Readings from Last 3 Encounters:  08/01/23 121 lb 6.4 oz (55.1 kg)  07/12/22 117 lb 6.4 oz (53.3 kg)  05/22/21 120 lb 3.2 oz (54.5 kg)    Studies/Labs Reviewed:   EKG:   EKG Interpretation Date/Time:  Thursday August 01 2023 10:26:29 EST Ventricular Rate:  70 PR Interval:    QRS Duration:  170 QT Interval:  438 QTC Calculation: 473 R Axis:   -73  Text Interpretation: Background atrial fibrillation with complete heart block Ventricular-paced rhythm When compared with ECG of 27-Aug-2013 14:33, Atrial fibrillation is now present Confirmed by Amauri Keefe ([id number]) on 08/01/2023 6:27:26 PM         1120 03/2020 Cholesterol 167, HDL 63, LDL 93, triglycerides 57 Potassium 4.4, TSH 3.09, creatinine 0.9  08/09/2021 Cholesterol 175, HDL 49, LDL 115,  triglycerides 53  08/21/2022 Cholesterol 179, HDL 59, LDL 108, triglycerides 61   ASSESSMENT:    1. Paroxysmal atrial fibrillation (HCC)   2. CHB (complete heart block) (HCC)   3. Pacemaker  4. Essential hypertension   5. Orthostatic hypotension     PLAN:  In order of problems listed above:  CHB: Pacemaker dependent.  No underlying escape rhythm. PM: Normal device function, compliant with remote downloads every 3 months.  Positive R wave in lead V1 suggests that her ventricular lead may be located in the coronary sinus/middle cardiac vein. This is confirmed by one of her old chest x-rays.  AFib: The prevalence of paroxysmal atrial fibrillation continues to increase and is now almost 11%.  She is in atrial fibrillation today and has been so for over a week, but is unaware of the arrhythmia and remains completely asymptomatic.  I have recommended anticoagulants, but she has refused since she is concerned about easy bleeding from her fragile skin and epistaxis.  She has had 1 serious fall recently.  We briefly also discussed the Watchman device, but I am not sure that is a good choice at age 16.  Strongly encouraged her to call me promptly if she has even brief neurological focal deficits that would suggest stroke or TIA.  CHADSVasc 4 (age 42, gender, hypertension). HTN: Moderately elevated blood pressure in clinic today.  Switch to carvedilol instead of metoprolol.  Asked her to send a blood pressure log in about a week.  Tolerate systolic blood pressure in the 140-150 range, due to her previous problems with orthostatic hypotension, but would like her diastolic blood pressure to be less than 90.. Right eye hordeolum: Has caused a little bit of bleeding around her eyelids.  Seems to be healing.   Medication Adjustments/Labs and Tests Ordered: Current medicines are reviewed at length with the patient today.  Concerns regarding medicines are outlined above.  Medication changes, Labs and Tests  ordered today are listed in the Patient Instructions below. Patient Instructions  Medication Instructions:  Stop Metoprolol START CARVEDILOL 12.5 MG TWICE A DAY *If you need a refill on your cardiac medications before your next appointment, please call your pharmacy*  SEND IN A BP LOG IN ONE WEEK   Follow-Up: At [facility], you and your health needs are our priority.  As part of our continuing mission to provide you with exceptional heart care, we have created designated Provider Care Teams.  These Care Teams include your primary Cardiologist (physician) and Advanced Practice Providers (APPs -  Physician Assistants and Nurse Practitioners) who all work together to provide you with the care you need, when you need it.  We recommend signing up for the patient portal called "MyChart".  Sign up information is provided on this After Visit Summary.  MyChart is used to connect with patients for Virtual Visits (Telemedicine).  Patients are able to view lab/test results, encounter notes, upcoming appointments, etc.  Non-urgent messages can be sent to your provider as well.   To learn more about what you can do with MyChart, go to [email or web address].    Your next appointment:   1 year(s)  Provider:   Dr Royann Shivers             Signed, Thurmon Fair, MD  08/01/2023 6:30 PM    [facility] [address] [place], [place], [place]  [address] Phone: [phone]; Fax: [phone]

## 2023-08-09 ENCOUNTER — Telehealth: Payer: Self-pay | Admitting: Cardiovascular Disease

## 2023-08-09 NOTE — Telephone Encounter (Signed)
Pt c/o medication issue:  1. Name of Medication:   carvedilol (COREG) 12.5 MG tablet    2. How are you currently taking this medication (dosage and times per day)?    3. Are you having a reaction (difficulty breathing--STAT)? no  4. What is your medication issue? Patient has some concerns fo the new medication she has been put on. She is worried about the side effect. Please advise

## 2023-08-09 NOTE — Telephone Encounter (Signed)
Called and spoke to patient. Verified name and DOB. Patient called with concerns about starting Carvedilol. She has not started taking the medication. She read the side effect and became concerned. She is worried about having to avoid driving, dizziness and leg swelling in people 65 years and older. I did explain to her that side effects happen in very few people and some people don't have side effects at all. Advised her she should avoid driving if she is feeling light headed, dizzy or any other symptoms. Patient stated she will start medication tomorrow. Advised to call back with questions or concerns.

## 2023-08-15 ENCOUNTER — Telehealth: Payer: Self-pay | Admitting: Cardiovascular Disease

## 2023-08-15 MED ORDER — CARVEDILOL 6.25 MG PO TABS: 6.2500 mg | ORAL_TABLET | Freq: Two times a day (BID) | ORAL | 1 refills | Status: DC

## 2023-08-15 NOTE — Telephone Encounter (Signed)
 Patient identification verified by 2 forms. Kathryn Cooks, RN    Called and spoke to patient  Patient states:   -Started Coreg  12.5mg  on 08/10/23  -she has dizziness since started coreg  prescription   -most recent home BP was 174/88 on Sunday 08/11/23  -takes Coreg  in the morning, took it at 8am and dizziness started at 10am   -did not feel dizzy before take coreg    -she is unable to check BP on her own   -throughout the day dizziness improves/resolves   -she discontinued the metoprolol   Patient denies:   -LOC/falls  Informed patient message sent to Dr. Francyne for input/advisement  Patient verbalized understanding, no questions at this time

## 2023-08-15 NOTE — Telephone Encounter (Signed)
 Let's try dropping carvedilol  to 6.25 mg (half tabet) twice a day.

## 2023-08-15 NOTE — Telephone Encounter (Signed)
 Pt c/o medication issue:  1. Name of Medication: carvedilol  (COREG ) 12.5 MG tablet   2. How are you currently taking this medication (dosage and times per day)? As written   3. Are you having a reaction (difficulty breathing--STAT)? No   4. What is your medication issue? Pt called stating she is having some dizziness and asked if this was a normal side effect to this medication. She also asked if its okay for her to still take her aspirin  with this. Please advise.

## 2023-08-15 NOTE — Telephone Encounter (Signed)
 Patient identification verified by 2 forms. Kathryn Cooks, RN    Called and spoke to patient  Relayed provider message below  Advised patient:   -for current Rx she has at home, take half tablet twice a day   -for new prescription take 1 tablet twice a day   -outreach if no improvement in symptoms  Patient verbalized understanding, no questions at this time

## 2023-08-20 ENCOUNTER — Telehealth: Payer: Self-pay | Admitting: Cardiovascular Disease

## 2023-08-20 NOTE — Telephone Encounter (Signed)
Patient identification verified by 2 forms. Marilynn Rail, RN    Received call from Patient  Patient states:   -she has swelling and SOB   -had swelling at 1/23 OV, swelling has worsened since appointment   -does not check daily weights  -notes 5lb weight gain since 1/23 OV with Dr. Royann Shivers   -2/8: 168/88 at 3pm   -2/9: 165/100 at 4pm   -takes carvedilol 6.25mg  BID   -unsure if swelling if related to elevated BP   -SOB on going for some time but has recently noticed it  Patient offered 2/11 OV, patient declined due to weather  Patient scheduled for OV 2/12 at 8:40am  Reviewed ED warning signs/precautions  Patient verbalized understanding, no questions at this time

## 2023-08-20 NOTE — Progress Notes (Unsigned)
  Cardiology Office Note:  .   Date:  08/20/2023  ID:  Kathryn [name], DOB May 21, 1928, MRN [id number] PCP: Rodrigo Ran, MD  [facility] Providers Cardiologist:  Thurmon Fair, MD { Click to update primary MD,subspecialty MD or APP then REFRESH:1}   History of Present Illness: .   No chief complaint on file.   Kathryn [name] is a 89 y.o. female with history of CHB s/p ppm who presents for evaluation of LE edema.      Problem List CHB -s/p ppm 2. Paroxysmal Afib  -refused AC 3. HTN    ROS: All other ROS reviewed and negative. Pertinent positives noted in the HPI.     Studies Reviewed: Marland Kitchen       Pacer interrogation 06/12/2023 100% V paced  7% pAF Physical Exam:   VS:  There were no vitals taken for this visit.   Wt Readings from Last 3 Encounters:  08/01/23 121 lb 6.4 oz (55.1 kg)  07/12/22 117 lb 6.4 oz (53.3 kg)  05/22/21 120 lb 3.2 oz (54.5 kg)    GEN: Well nourished, well developed in no acute distress NECK: No JVD; No carotid bruits CARDIAC: ***RRR, no murmurs, rubs, gallops RESPIRATORY:  Clear to auscultation without rales, wheezing or rhonchi  ABDOMEN: Soft, non-tender, non-distended EXTREMITIES:  No edema; No deformity  ASSESSMENT AND PLAN: .   ***    {Are you ordering a CV Procedure (e.g. stress test, cath, DCCV, TEE, etc)?   Press F2        :[id number]}   Follow-up: No follow-ups on file.  Time Spent with Patient: I have spent a total of *** minutes caring for this patient today face to face, ordering and reviewing labs/tests, reviewing prior records/medical history, examining the patient, establishing an assessment and plan, communicating results/findings to the patient/family, and documenting in the medical record.   Signed, Lenna Gilford. Flora Lipps, MD, [facility]  [facility]  [address], [address] [place], [place] [address] [phone]  7:04 PM

## 2023-08-20 NOTE — Telephone Encounter (Signed)
Pt c/o swelling: STAT is pt has developed SOB within 24 hours  How much weight have you gained and in what time span? About 5 or 6 lbs over the last week  If swelling, where is the swelling located? Feet and Legs  Are you currently taking a fluid pill? No  Are you currently SOB? Yes, and she's feeling it now   Do you have a log of your daily weights (if so, list)? No  Have you gained 3 pounds in a day or 5 pounds in a week? Yes, 5 lbs in a week   Have you traveled recently? No

## 2023-08-21 ENCOUNTER — Ambulatory Visit: Payer: Medicare Other | Admitting: Cardiovascular Disease

## 2023-08-21 ENCOUNTER — Encounter: Payer: Self-pay | Admitting: Cardiovascular Disease

## 2023-08-21 ENCOUNTER — Telehealth: Payer: Self-pay | Admitting: Cardiovascular Disease

## 2023-08-21 VITALS — Ht 64.5 in

## 2023-08-21 DIAGNOSIS — I150 Renovascular hypertension: Secondary | ICD-10-CM

## 2023-08-21 DIAGNOSIS — I480 Paroxysmal atrial fibrillation: Secondary | ICD-10-CM

## 2023-08-21 DIAGNOSIS — I442 Atrioventricular block, complete: Secondary | ICD-10-CM

## 2023-08-21 DIAGNOSIS — R0602 Shortness of breath: Secondary | ICD-10-CM

## 2023-08-21 DIAGNOSIS — R600 Localized edema: Secondary | ICD-10-CM

## 2023-08-21 DIAGNOSIS — Z950 Presence of cardiac pacemaker: Secondary | ICD-10-CM
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
## 2023-08-21 NOTE — Progress Notes (Unsigned)
Cardiology Office Note:  .   Date:  08/23/2023  ID:  Kathryn [name], DOB 07-Apr-1928, MRN [id number] PCP: Rodrigo Ran, MD  [facility] Providers Cardiologist:  Thurmon Fair, MD   History of Present Illness: .   Kathryn [name] is a 89 y.o. female with a past medical history of paroxysmal atrial fibrillation, hypertension, complete heart block s/p pacemaker.  She is followed by Dr. Royann Shivers and presents today for evaluation of shortness of breath, lower extremity swelling.  Patient has a history of complete heart block, headed dual-chamber pacemaker implantation 2000.  Later had a generator change out in 2008.  In 2000, patient had presented with what appeared to be an acute anterior wall myocardial infarction following a bradycardia related to ventricular fibrillation episode during treadmill stress test.  She underwent emergency cardiac catheterization and was found to have normal coronary arteries with a Takotsubo cardiomyopathy.  She has a known history of proximal atrial fibrillation, has declined oral anticoagulants in the past repeatedly.  Patient later had pacemaker generator change out in 2017.  Had visual disturbance in 02/2016, underwent carotid ultrasounds on 02/08/2016 that showed less than 40% stenosis in bilateral internal carotid arteries.  Patient was last seen by Dr. Royann Shivers on 08/01/2023 for pacemaker check.  At that time, patient denies shortness of breath, chest pain, palpitations, dizziness, syncope.  Interrogation of her pacemaker showed that her burden of atrial fibrillation was up to 11%.  She was asymptomatic when in atrial fibrillation.  When not in atrial fibrillation, she was virtually 100% atrial pacing.  She continued to decline the use of oral anticoagulants.  Today, patient presents for evaluation of progressive lower extremity edema and dyspnea that has been ongoing for the past 2 month or so. The symptoms have worsened since her last cardiology  appointment in late January. The patient also reports dizziness and balance issues, which began after a fall in October. She reports that dizziness tends to hit her after she has been walking for a bit. She does not have dizziness every time she stands, and she is able to walk short distances without becoming dizzy. The patient denies any chest pain but reports difficulty in breathing when talking or exerting herself. Had a difficult time walking into the appointment today due to shortness of breath on exertion. She also can become short of breath when speaking. She does not have shortness of breath while at rest or orthopnea. The patient also mentions a recent increase in blood pressure. The patient has been managing the edema by keeping her feet propped up, which has provided some relief.   ROS: Denies chest pain, palpitations, syncope, near syncope. Does have lower extremity edema, dyspnea on exertion.   Studies Reviewed: .       Risk Assessment/Calculations:    CHA2DS2-VASc Score = 4  This indicates a 4.8% annual risk of stroke. The patient's score is based upon: CHF History: 0 HTN History: 1 Diabetes History: 0 Stroke History: 0 Vascular Disease History: 0 Age Score: 2 Gender Score: 1       Physical Exam:   VS:  BP (!) 158/92 (BP Location: Left Arm, Patient Position: Sitting, Cuff Size: Normal)   Pulse 77   Ht 5\' 4"  (1.626 m)   Wt 127 lb (57.6 kg)   BMI 21.80 kg/m    Wt Readings from Last 3 Encounters:  08/23/23 127 lb (57.6 kg)  08/01/23 121 lb 6.4 oz (55.1 kg)  07/12/22 117 lb 6.4 oz (53.3  kg)    GEN: Well nourished, well developed in no acute distress. Sitting comfortably in the chair  NECK: No JVD CARDIAC: RRR. Grade 2/6 systolic murmur  RESPIRATORY:  Crackles in bilateral lung bases. Normal WOB on room air  ABDOMEN: Soft, non-tender EXTREMITIES:  2+ edema in BLE. No deformity   ASSESSMENT AND PLAN: .     Shortness of breath Lower extremity swelling -Patient  reports that she has been having progressive lower extremity swelling and shortness of breath for the past 2 months or so.  Symptoms seem to have worsened in the past 2 weeks.  Has shortness of breath on exertion or when speaking.  Denies shortness of breath when completely at rest, orthopnea.  Denies chest pain -On exam today, patient has crackles in bilateral lung bases, 2+ edema in bilateral lower extremities.  Also has grade 2/6 systolic murmur on exam -Ordered echocardiogram.  She has not had an echocardiogram since 2017 -Ordered BNP, BMP to be collected today. Plan to repeat in 2 weeks  -Start Lasix 40 mg daily.  Also start potassium chloride 10 mEq daily  Complete heart block s/p pacemaker -Pacemaker monitored by Dr. Royann Shivers -Last device check from 08/01/2023 showed that patient was pacemaker dependent. Lead parameters normal, estimated generator longevity 3.5 years   -Continue remote device checks   Paroxysmal atrial fibrillation -Patient's burden of atrial fibrillation has been increasing over time, when pacemaker was last checked in 07/2023 showed 11% atrial fibrillation burden. -Patient asymptomatic and unaware of atrial fibrillation -CHA2DS2-VASc 4 (age 7, gender, hypertension).  Patient refuses anticoagulation. Continue ASA 81 mg daily   Hypertension Dizziness  - BP elevated today in clinic, 158/92. BP has been similarly elevated at home - Patient reports some dizziness when walking long distances. She is able to go from sitting to standing and walk short distances without dizziness. She had been on carvedilol 12.5 mg BID, but he dose was decreased to 6.25 due to dizziness. She did not notice any changes in symptoms after decreasing the dose of carvedilol. I suspect her dizziness may be related to her shortness of breath on exertion, as the symptoms often occur together  - Increase carvedilol to 12.5 mg BID for better BP control - Start lasix 40 mg daily as above  -Continue  olmesartan 40 mg daily - Ordered BMP as above   Dispo: Follow up in 2-3 weeks with APP   Signed, Jonita Albee, PA-C

## 2023-08-21 NOTE — Telephone Encounter (Signed)
Pt c/o swelling/edema: STAT if pt has developed SOB within 24 hours  If swelling, where is the swelling located?  Both legs and feet  How much weight have you gained and in what time span?  Yes  Have you gained 2 pounds in a day or 5 pounds in a week?   5 lbs in a week  Do you have a log of your daily weights (if so, list)?   No  Are you currently taking a fluid pill?  Yes  Are you currently SOB?   Yes  Have you traveled recently in a car or plane for an extended period of time?  Patient stated she has been having swelling which has gotten worse in the last 3-4 weeks.  Patient unable to keep appointment today and rescheduled to 2/21.

## 2023-08-23 ENCOUNTER — Encounter: Payer: Self-pay | Admitting: Cardiology

## 2023-08-23 ENCOUNTER — Ambulatory Visit: Payer: Medicare Other | Attending: Cardiology | Admitting: Cardiology

## 2023-08-23 VITALS — BP 158/92 | HR 77 | Ht 64.0 in | Wt 127.0 lb

## 2023-08-23 DIAGNOSIS — I10 Essential (primary) hypertension: Secondary | ICD-10-CM

## 2023-08-23 DIAGNOSIS — R600 Localized edema: Secondary | ICD-10-CM | POA: Diagnosis not present

## 2023-08-23 DIAGNOSIS — R0602 Shortness of breath: Secondary | ICD-10-CM

## 2023-08-23 DIAGNOSIS — I480 Paroxysmal atrial fibrillation: Secondary | ICD-10-CM

## 2023-08-23 DIAGNOSIS — I442 Atrioventricular block, complete: Secondary | ICD-10-CM

## 2023-08-23 DIAGNOSIS — R42 Dizziness and giddiness: Secondary | ICD-10-CM

## 2023-08-23 MED ORDER — FUROSEMIDE 40 MG PO TABS: 40.0000 mg | ORAL_TABLET | Freq: Every day | ORAL | 3 refills | Status: DC

## 2023-08-23 MED ORDER — POTASSIUM CHLORIDE ER 10 MEQ PO TBCR: 10.0000 meq | EXTENDED_RELEASE_TABLET | Freq: Every day | ORAL | 3 refills | Status: DC

## 2023-08-23 MED ORDER — CARVEDILOL 12.5 MG PO TABS: 12.5000 mg | ORAL_TABLET | Freq: Two times a day (BID) | ORAL | 3 refills | Status: DC

## 2023-08-23 NOTE — Patient Instructions (Addendum)
Medication Instructions:  Start Lasix 40 mg once a day Start Potassium 10 Meg once a day Increase Carvedilol to 12.5 mg twice a day *If you need a refill on your cardiac medications before your next appointment, please call your pharmacy*  Lab Work: Today we are going to draw Bmet and BNP If you have labs (blood work) drawn today and your tests are completely normal, you will receive your results only by: MyChart Message (if you have MyChart) OR A paper copy in the mail If you have any lab test that is abnormal or we need to change your treatment, we will call you to review the results.  Testing/Procedures: Your physician has requested that you have an echocardiogram. Echocardiography is a painless test that uses sound waves to create images of your heart. It provides your doctor with information about the size and shape of your heart and how well your heart's chambers and valves are working. This procedure takes approximately one hour. There are no restrictions for this procedure. Please do NOT wear cologne, perfume, aftershave, or lotions (deodorant is allowed). Please arrive 15 minutes prior to your appointment time.  Please note: We ask at that you not bring children with you during ultrasound (echo/ vascular) testing. Due to room size and safety concerns, children are not allowed in the ultrasound rooms during exams. Our front office staff cannot provide observation of children in our lobby area while testing is being conducted. An adult accompanying a patient to their appointment will only be allowed in the ultrasound room at the discretion of the ultrasound technician under special circumstances. We apologize for any inconvenience.  Follow-Up: At [facility], you and your health needs are our priority.  As part of our continuing mission to provide you with exceptional heart care, we have created designated Provider Care Teams.  These Care Teams include your primary Cardiologist  (physician) and Advanced Practice Providers (APPs -  Physician Assistants and Nurse Practitioners) who all work together to provide you with the care you need, when you need it.  We recommend signing up for the patient portal called "MyChart".  Sign up information is provided on this After Visit Summary.  MyChart is used to connect with patients for Virtual Visits (Telemedicine).  Patients are able to view lab/test results, encounter notes, upcoming appointments, etc.  Non-urgent messages can be sent to your provider as well.   To learn more about what you can do with MyChart, go to [email or web address].    Your next appointment:   2-3 week(s)  Provider:   Robet Leu, PA-C

## 2023-08-24 LAB — BASIC METABOLIC PANEL
BUN/Creatinine Ratio: 19 (ref 12–28)
BUN: 21 mg/dL (ref 10–36)
CO2: 23 mmol/L (ref 20–29)
Calcium: 9.7 mg/dL (ref 8.7–10.3)
Chloride: 104 mmol/L (ref 96–106)
Creatinine, Ser: 1.08 mg/dL — ABNORMAL HIGH (ref 0.57–1.00)
Glucose: 98 mg/dL (ref 70–99)
Potassium: 4.4 mmol/L (ref 3.5–5.2)
Sodium: 141 mmol/L (ref 134–144)
eGFR: 48 mL/min/{1.73_m2} — ABNORMAL LOW (ref >59)

## 2023-08-24 LAB — BRAIN NATRIURETIC PEPTIDE: BNP: 371.8 pg/mL — ABNORMAL HIGH (ref 0.0–100.0)

## 2023-08-27 ENCOUNTER — Telehealth: Payer: Self-pay

## 2023-08-27 DIAGNOSIS — I442 Atrioventricular block, complete: Secondary | ICD-10-CM

## 2023-08-27 DIAGNOSIS — I480 Paroxysmal atrial fibrillation: Secondary | ICD-10-CM

## 2023-08-27 NOTE — Telephone Encounter (Signed)
-----   Message from Jonita Albee sent at 08/25/2023  3:23 PM EST ----- Please tell patient that her BNP is elevated, confirming extra fluid in her system. She should continue lasix 40 mg daily and her potassium supplement. We will need a BMP in 2 weeks to make sure her potassium and kidney function stay stable on diuretics.   She should also be scheduled for a follow up appointment in 3 weeks, after her echo 3/4

## 2023-08-27 NOTE — Telephone Encounter (Signed)
Called patient advised of below they verbalized understanding Ordered BMP

## 2023-08-29 ENCOUNTER — Telehealth: Payer: Self-pay | Admitting: Cardiology

## 2023-08-29 ENCOUNTER — Telehealth: Payer: Self-pay | Admitting: Cardiovascular Disease

## 2023-08-29 NOTE — Telephone Encounter (Signed)
Patient may need assistance sending her transmission. We have not received one.

## 2023-08-29 NOTE — Telephone Encounter (Signed)
Please let her know that that is exactly what the Lasix is supposed to do.  Is her shortness of breath improved?  Has the edema in her legs resolved?  Once she is back to her usual volume situation we can decrease the dose or the frequency of the diuretic.

## 2023-08-29 NOTE — Telephone Encounter (Signed)
  1. Has your device fired? no  2. Is you device beeping? no  3. Are you experiencing draining or swelling at device site? no  4. Are you calling to see if we received your device transmission? Yes, please give her a call   5. Have you passed out? no   Please route to Device Clinic Pool

## 2023-08-29 NOTE — Telephone Encounter (Signed)
Pt c/o medication issue:  1. Name of Medication: furosemide (LASIX) 40 MG tablet   2. How are you currently taking this medication (dosage and times per day)? As written  3. Are you having a reaction (difficulty breathing--STAT)? No   4. What is your medication issue? Pt wants to know how long she needs to stay on medication

## 2023-08-29 NOTE — Telephone Encounter (Signed)
I spoke with the pt and let her know that her monitor is updating.   She has concerns about her lasik. She is using the bathroom a lot and loss 7lbs since she started the lasik. She would like for someone to give her a call back.

## 2023-08-29 NOTE — Telephone Encounter (Signed)
Jonita Albee, PA-C Regarding results: 2     08/25/23  3:23 PM Result Note Please tell patient that her BNP is elevated, confirming extra fluid in her system. She should continue lasix 40 mg daily and her potassium supplement. We will need a BMP in 2 weeks to make sure her potassium and kidney function stay stable on diuretics. She should also be scheduled for a follow up appointment in 3 weeks, after her echo 3/4   Patient identification verified by 2 forms. Marilynn Rail, RN    Called and spoke to patient  Patient states:   -would like to know when to D/C lasix  Informed patient:   -40mg  lasix is new daily dose   -present to lab ~3/4 for repeat labs   -office will call with results and provide recommendations  Patient verbalized understanding, no questions at this time

## 2023-08-30 ENCOUNTER — Ambulatory Visit: Payer: Medicare Other | Admitting: Nurse Practitioner

## 2023-09-06 MED ORDER — FUROSEMIDE 40 MG PO TABS: 20.0000 mg | ORAL_TABLET | ORAL | Status: DC | PRN

## 2023-09-06 MED ORDER — POTASSIUM CHLORIDE ER 10 MEQ PO TBCR: 10.0000 meq | EXTENDED_RELEASE_TABLET | ORAL | Status: DC | PRN

## 2023-09-06 NOTE — Telephone Encounter (Signed)
 Called pt back regarding increased volume. Patient identification verified by 2 forms. Pt states that she is down 10 lbs and feels clinically improved. Pt decided to stop lasix and potassium on Monday when she saw that her legs were back to normal. Pt states that lasix dose made her dizzy and weak. Pt will continue to hold lasix and only use when she swells. She will plan to take 20mg  of lasix and potassium if she experiences lower extremity swelling again. Will adjust medication list to reflect. Pt states her weight right now is 119-120lbs. We discussed that this is her dry weight. Pt is scheduled to have an echocardiogram next week. When she comes for that appointment she will plan to have labs drawn. Pt has follow up office visit schedule with Robet Leu, PA on 3/17. Pt has no further questions at this time.

## 2023-09-10 ENCOUNTER — Ambulatory Visit: Payer: Medicare Other

## 2023-09-10 ENCOUNTER — Ambulatory Visit (HOSPITAL_COMMUNITY): Payer: Medicare Other | Attending: Cardiology

## 2023-09-10 DIAGNOSIS — I442 Atrioventricular block, complete: Secondary | ICD-10-CM

## 2023-09-10 DIAGNOSIS — I480 Paroxysmal atrial fibrillation: Secondary | ICD-10-CM | POA: Insufficient documentation

## 2023-09-10 LAB — ECHOCARDIOGRAM COMPLETE
AR max vel: 1.31 cm2
AV Area VTI: 1.34 cm2
AV Area mean vel: 1.28 cm2
AV Mean grad: 10 mmHg
AV Peak grad: 17.8 mmHg
Ao pk vel: 2.11 m/s
Area-P 1/2: 5.13 cm2
MV M vel: 5.48 m/s
MV Peak grad: 120.1 mmHg
S' Lateral: 3 cm

## 2023-09-11 LAB — CUP PACEART REMOTE DEVICE CHECK
Battery Impedance: 1673 Ohm
Battery Remaining Longevity: 41 mo
Battery Voltage: 2.77 V
Brady Statistic AP VP Percent: 71 %
Brady Statistic AP VS Percent: 0 %
Brady Statistic AS VP Percent: 29 %
Brady Statistic AS VS Percent: 0 %
Date Time Interrogation Session: 20250304105946
Implantable Lead Connection Status: 753985
Implantable Lead Connection Status: 753985
Implantable Lead Implant Date: 20000522
Implantable Lead Implant Date: 20000526
Implantable Lead Location: 753859
Implantable Lead Location: 753860
Implantable Pulse Generator Implant Date: 20170328
Lead Channel Impedance Value: 427 Ohm
Lead Channel Impedance Value: 675 Ohm
Lead Channel Pacing Threshold Amplitude: 0.75 V
Lead Channel Pacing Threshold Amplitude: 0.875 V
Lead Channel Pacing Threshold Pulse Width: 0.4 ms
Lead Channel Pacing Threshold Pulse Width: 0.4 ms
Lead Channel Setting Pacing Amplitude: 2 V
Lead Channel Setting Pacing Amplitude: 2 V
Lead Channel Setting Pacing Pulse Width: 0.4 ms
Lead Channel Setting Sensing Sensitivity: 2.8 mV
Zone Setting Status: 755011
Zone Setting Status: 755011

## 2023-09-12 ENCOUNTER — Telehealth: Payer: Self-pay

## 2023-09-12 NOTE — Telephone Encounter (Signed)
 Left message to call back

## 2023-09-12 NOTE — Telephone Encounter (Signed)
-----   Message from Jonita Albee sent at 09/12/2023  3:31 PM EST ----- Please tell patient that her echocardiogram showed EF (pumping function of the heart) is normal at 60-65%. However, the heart walls are a bit stiff and do not relax fully. This is likely what is causing her to hold onto extra fluid. Echo showed a bit of fluid in her right lung. I see her lasix is now being taken as needed- If she feels like her ankle swelling and shortness of breath have improved, then she can continue to take lasix as needed. If she continues to be short of breath, let me know   RV function is normal and size is normal as well. The tricuspid valve is mild-moderately leaking, the mitral valve is mildly leaking. The aortic valve is a bit thick, but mildly so. For now, no indication for intervention. We will continue to monitor   Thanks KJ

## 2023-09-13 NOTE — Telephone Encounter (Signed)
 Patient is returning call. Please advise?

## 2023-09-13 NOTE — Telephone Encounter (Signed)
 Patient identification verified by 2 forms. Marilynn Rail, RN    Called and spoke to patient  Relayed provider result message  Advised patient to monitor symptoms and follow up at 3/17 OV  Patient verbalized understanding, no questions at this time

## 2023-09-18 NOTE — Progress Notes (Signed)
 Cardiology Office Note:  .   Date:  09/23/2023  ID:  Azzie Glatter O'Daniel, DOB Mar 21, 1928, MRN [id number] PCP: Rodrigo Ran, MD  [facility] Providers Cardiologist:  Thurmon Fair, MD   History of Present Illness: .   Ruthene Lambert Mody O'Daniel is a 89 y.o. female with a past medical history of paroxysmal atrial fibrillation, hypertension, complete heart block s/p pacemaker.  She is followed by Dr. Royann Shivers and presents today for a follow up appointment   Patient has a history of complete heart block, headed dual-chamber pacemaker implantation 2000.  Later had a generator change out in 2008.  In 2000, patient had presented with what appeared to be an acute anterior wall myocardial infarction following a bradycardia related to ventricular fibrillation episode during treadmill stress test.  She underwent emergency cardiac catheterization and was found to have normal coronary arteries with a Takotsubo cardiomyopathy.  She has a known history of proximal atrial fibrillation, has declined oral anticoagulants in the past repeatedly.   Patient later had pacemaker generator change out in 2017.  Had visual disturbance in 02/2016, underwent carotid ultrasounds on 02/08/2016 that showed less than 40% stenosis in bilateral internal carotid arteries.   Patient was last seen by Dr. Royann Shivers on 08/01/2023 for pacemaker check.  At that time, patient denies shortness of breath, chest pain, palpitations, dizziness, syncope.  Interrogation of her pacemaker showed that her burden of atrial fibrillation was up to 11%.  She was asymptomatic when in atrial fibrillation.  When not in atrial fibrillation, she was virtually 100% atrial pacing.  She continued to decline the use of oral anticoagulants.  I saw patient in the clinic on 08/23/23 for evaluation of progressive lower extremity edema and dyspnea that had been going on for the past 2 months or so. She also reported having some dizziness and balance issues that began after  a fall in 04/2023. I started on her lasix 40 mg daily and potassium supplementation. She took the lasix for about a week and a half, and felt much better. She was down 10 lbs. Echocardiogram 09/10/23 showed EF 60-65%, grade III DD, normal RV function, mild MR, mild-moderate TR, mild AS.   Today, patient presents for a follow up appointment. She has a few concerns, including complaints of general weakness, high blood pressure, and swelling in the ankles. The patient reports that the weakness has been ongoing for a while, and it was particularly noticeable while she was taking Lasix. The patient stopped taking Lasix after about a week- ten days. She has not noticed improvement in her energy levels since stopping lasix. Since she stopped lasix, she has noticed a decrease in urination and an increase in swelling in the ankles. The patient also reports a recent fall and bruising, which has resulted in one ankle being more swollen than the other. The patient has been using a cane to assist with mobility and reports occasional dizziness. The patient also reports shortness of breath, particularly when talking, but denies any coughing or chest pain. The patient has noticed increased sweating at night, which she finds aggravating.  ROS: Patient denies chest pain, palpitations, syncope, near syncope. She has experienced elevated BP, ankle swelling, shortness of breath on exertion, fatigue  Studies Reviewed: .   Cardiac Studies & Procedures   ______________________________________________________________________________________________     ECHOCARDIOGRAM  ECHOCARDIOGRAM COMPLETE 09/10/2023  Narrative ECHOCARDIOGRAM REPORT    Patient Name:   SALEM MASTROGIOVANNI Date of Exam: 09/10/2023 Medical Rec #:  [id number]  Height:       64.0 in Accession #:    [id number]     Weight:       127.0 lb Date of Birth:  [birth year]     BSA:          1.613 m Patient Age:    89 years       BP:           158/92 mmHg Patient  Gender: F              HR:           78 bpm. Exam Location:  [address]  Procedure: 2D Echo, Color Doppler, Cardiac Doppler and 3D Echo (Both Spectral and Color Flow Doppler were utilized during procedure).  Indications:    Complete Heart Block I44.2  History:        Patient has prior history of Echocardiogram examinations, most recent 10/09/2011. Pacemaker; Risk Factors:Hypertension.  Sonographer:    Thurman Coyer RDCS Referring Phys: [id number] Jonita Albee  IMPRESSIONS   1. Left ventricular ejection fraction, by estimation, is 60 to 65%. Left ventricular ejection fraction by 3D volume is 62 %. The left ventricle has normal function. The left ventricle demonstrates regional wall motion abnormalities (see scoring diagram/findings for description). Left ventricular diastolic parameters are consistent with Grade III diastolic dysfunction (restrictive). 2. Right ventricular systolic function is normal. The right ventricular size is normal. There is normal pulmonary artery systolic pressure. 3. Left atrial size was mildly dilated. 4. Right atrial size was severely dilated. 5. Moderate pleural effusion in the right lateral region. 6. The mitral valve is normal in structure. Mild mitral valve regurgitation. No evidence of mitral stenosis. 7. Tricuspid valve regurgitation is mild to moderate. 8. The aortic valve is tricuspid. There is mild calcification of the aortic valve. There is mild thickening of the aortic valve. Aortic valve regurgitation is not visualized. Mild aortic valve stenosis. Aortic valve area, by VTI measures 1.34 cm. Aortic valve mean gradient measures 10.0 mmHg. Aortic valve Vmax measures 2.11 m/s. 9. The inferior vena cava is normal in size with greater than 50% respiratory variability, suggesting right atrial pressure of 3 mmHg.  FINDINGS Left Ventricle: Left ventricular ejection fraction, by estimation, is 60 to 65%. Left ventricular ejection fraction by 3D  volume is 62 %. The left ventricle has normal function. The left ventricle demonstrates regional wall motion abnormalities. The left ventricular internal cavity size was normal in size. There is no left ventricular hypertrophy. Left ventricular diastolic parameters are consistent with Grade III diastolic dysfunction (restrictive).   LV Wall Scoring: There is diffuse normal.  Right Ventricle: The right ventricular size is normal. No increase in right ventricular wall thickness. Right ventricular systolic function is normal. There is normal pulmonary artery systolic pressure. The tricuspid regurgitant velocity is 2.73 m/s, and with an assumed right atrial pressure of 3 mmHg, the estimated right ventricular systolic pressure is 32.8 mmHg.  Left Atrium: Left atrial size was mildly dilated.  Right Atrium: Right atrial size was severely dilated.  Pericardium: There is no evidence of pericardial effusion.  Mitral Valve: The mitral valve is normal in structure. Mild mitral annular calcification. Mild mitral valve regurgitation. No evidence of mitral valve stenosis.  Tricuspid Valve: The tricuspid valve is normal in structure. Tricuspid valve regurgitation is mild to moderate. No evidence of tricuspid stenosis.  Aortic Valve: The aortic valve is tricuspid. There is mild calcification of the aortic valve. There is mild thickening of  the aortic valve. Aortic valve regurgitation is not visualized. Mild aortic stenosis is present. Aortic valve mean gradient measures 10.0 mmHg. Aortic valve peak gradient measures 17.8 mmHg. Aortic valve area, by VTI measures 1.34 cm.  Pulmonic Valve: The pulmonic valve was normal in structure. Pulmonic valve regurgitation is mild. No evidence of pulmonic stenosis.  Aorta: The aortic root is normal in size and structure.  Venous: The inferior vena cava is normal in size with greater than 50% respiratory variability, suggesting right atrial pressure of 3  mmHg.  IAS/Shunts: No atrial level shunt detected by color flow Doppler.  Additional Comments: 3D was performed not requiring image post processing on an independent workstation and was normal. A device lead is visualized. There is a moderate pleural effusion in the right lateral region.   LEFT VENTRICLE PLAX 2D LVIDd:         4.20 cm         Diastology LVIDs:         3.00 cm         LV e' medial:   7.50 cm/s LV PW:         0.90 cm         LV E/e' medial: 18.8 LV IVS:        0.70 cm LVOT diam:     2.10 cm LV SV:         58              3D Volume EF LV SV Index:   36              LV 3D EF:    Left LVOT Area:     3.46 cm                     ventricul ar ejection fraction by 3D volume is 62 %.  3D Volume EF: 3D EF:        62 % LV EDV:       95 ml LV ESV:       36 ml LV SV:        59 ml  RIGHT VENTRICLE             IVC RV Basal diam:  3.50 cm     IVC diam: 1.80 cm RV Mid diam:    3.00 cm RV S prime:     10.10 cm/s TAPSE (M-mode): 2.8 cm  LEFT ATRIUM           Index        RIGHT ATRIUM           Index LA diam:      2.90 cm 1.80 cm/m   RA Area:     24.40 cm LA Vol (A2C): 37.5 ml 23.25 ml/m  RA Volume:   82.00 ml  50.84 ml/m LA Vol (A4C): 59.8 ml 37.07 ml/m AORTIC VALVE AV Area (Vmax):    1.31 cm AV Area (Vmean):   1.28 cm AV Area (VTI):     1.34 cm AV Vmax:           211.00 cm/s AV Vmean:          146.000 cm/s AV VTI:            0.434 m AV Peak Grad:      17.8 mmHg AV Mean Grad:      10.0 mmHg LVOT Vmax:         79.70 cm/s LVOT Vmean:  54.000 cm/s LVOT VTI:          0.168 m LVOT/AV VTI ratio: 0.39  AORTA Ao Root diam: 3.20 cm Ao Asc diam:  3.00 cm  MITRAL VALVE                TRICUSPID VALVE MV Area (PHT): 5.13 cm     TR Peak grad:   29.8 mmHg MV Decel Time: 148 msec     TR Vmax:        273.00 cm/s MR Peak grad: 120.1 mmHg MR Mean grad: 72.0 mmHg     SHUNTS MR Vmax:      548.00 cm/s   Systemic VTI:  0.17 m MR Vmean:     399.0 cm/s    Systemic  Diam: 2.10 cm MV E velocity: 141.00 cm/s MV A velocity: 53.60 cm/s MV E/A ratio:  2.63  Chilton Si MD Electronically signed by Chilton Si MD Signature Date/Time: 09/10/2023/5:50:32 PM    Final          ______________________________________________________________________________________________      Risk Assessment/Calculations:    CHA2DS2-VASc Score = 5  This indicates a 7.2% annual risk of stroke The patient's score is based upon: CHF History: 1 HTN History: 1 Diabetes History: 0 Stroke History: 0 Vascular Disease History: 0 Age Score: 2 Gender Score: 1    HYPERTENSION CONTROL Vitals:   09/23/23 1514 09/23/23 1620  BP: (!) 158/80 (!) 158/82    The patient's blood pressure is elevated above target today.  In order to address the patient's elevated BP: A new medication was prescribed today.          Physical Exam:   VS:  BP (!) 158/82   Pulse 70   Ht 5\' 4"  (1.626 m)   Wt 122 lb (55.3 kg)   SpO2 98%   BMI 20.94 kg/m    Wt Readings from Last 3 Encounters:  09/23/23 122 lb (55.3 kg)  08/23/23 127 lb (57.6 kg)  08/01/23 121 lb 6.4 oz (55.1 kg)    GEN: Well nourished, well developed in no acute distress. Sitting upright in the chair  NECK: No JVD CARDIAC: RRR, grade 2/6 systolic murmur  RESPIRATORY:  Clear to auscultation without rales, wheezing or rhonchi. Normal WOB on room air  ABDOMEN: Soft, non-tender, non-distended EXTREMITIES:  2+ edema in BLE; No deformity   ASSESSMENT AND PLAN: .    Diastolic Heart Failure , chronic  - Patient was seen 08/23/23 for shortness of breath, ankle edema. Started on 40 mg lasix daily. Later transitioned to 20 mg PRN  - Echo 09/10/23 showed EF 60-65%, grade III DD, normal RV function  - Patient has not been taking any lasix at home. Reports worsening shortness of breath on exertion, lower extremity swelling. She is concerned that 40 mg lasix was too high of a dose for her. She does not want to take lasix  every day  - Her oxygen saturation is 98% on room air and lungs are clear on exam. Reasonable to try lower dose of lasix at patient's request  - Instructed patient to take lasix 20 mg daily for the next 3 day. Then, she can go back to taking lasix every day  - Her BP is elevated today in clinic and has been elevated at home as well. Start spironolactone 12.5 mg daily for both BP control and diastolic heart failure  - Ordered BMP today  - Arranged 3 week follow up to review BP log and assess  symptoms on spiro and PRN lasix. Will obtain BMP at that appointment   Complete heart block s/p pacemaker -Pacemaker monitored by Dr. Royann Shivers -Last device check from 09/10/23 showed that patient had normal device function and was pacemaker dependent  -Continue remote device checks    Persistent atrial fibrillation -Patient's burden of atrial fibrillation has been increasing over time, when pacemaker was last checked in 07/2023 showed 11% atrial fibrillation burden.When most recently checked in 08/2023, she was in persistent atrial fibrillation since 2/1. Overall had a 85% afib burden  -Patient asymptomatic and unaware of atrial fibrillation -CHA2DS2-VASc 5 (age 78, gender, hypertension, CHF).  Patient refuses anticoagulation.  - With her refusal of AC, treatment options are limited. No DCCV, no antiarrhythmic medications  - I suspect that her lost of atrial kick is contributing to her hypervolemia and low energy. Discussed starting AC so that we could pursue rhythm control, but patient refuses  - Continue carvedilol 12.5 mg BID  - Continue ASA 81 mg daily   Mild-Moderate TR  Mild MR  Mild AS  - Noted on echocardiogram from 09/2023  - With patient's advanced age I do not anticipate routine monitoring.  - Continue PRN lasix as above. Start spiro 12.5 mg daily as above    Hypertension Dizziness  -In the past, patient had dizziness when walking long distances and short of breath. Dizziness did not improve  with permissive HTN.  - BP elevated to the 150s/80s systolic today in clinic. Patient reports that BP has been similarly elevated at home  - She continues to have occasional dizziness despite elevated BP. I do not think that her dizziness is due to orthostatic hypotension.  - Start spironolactone 12.5 mg daily  - Continue carvedilol 12.5 mg BID  -Continue olmesartan 40 mg daily - Instructed patient to keep BP log for 2 weeks. Arranged close follow up to review  - Ordered BMP today    Dispo: Follow up in 3 weeks with me   Signed, Jonita Albee, PA-C

## 2023-09-23 ENCOUNTER — Ambulatory Visit: Payer: Medicare Other | Attending: Cardiology | Admitting: Cardiology

## 2023-09-23 ENCOUNTER — Encounter: Payer: Self-pay | Admitting: Cardiology

## 2023-09-23 VITALS — BP 158/82 | HR 70 | Ht 64.0 in | Wt 122.0 lb

## 2023-09-23 DIAGNOSIS — I4819 Other persistent atrial fibrillation: Secondary | ICD-10-CM

## 2023-09-23 DIAGNOSIS — I442 Atrioventricular block, complete: Secondary | ICD-10-CM | POA: Diagnosis not present

## 2023-09-23 DIAGNOSIS — I5032 Chronic diastolic (congestive) heart failure: Secondary | ICD-10-CM | POA: Diagnosis not present

## 2023-09-23 DIAGNOSIS — I10 Essential (primary) hypertension: Secondary | ICD-10-CM | POA: Diagnosis not present

## 2023-09-23 DIAGNOSIS — I340 Nonrheumatic mitral (valve) insufficiency: Secondary | ICD-10-CM

## 2023-09-23 DIAGNOSIS — I361 Nonrheumatic tricuspid (valve) insufficiency: Secondary | ICD-10-CM

## 2023-09-23 MED ORDER — SPIRONOLACTONE 25 MG PO TABS: 12.5000 mg | ORAL_TABLET | Freq: Every day | ORAL | 3 refills | Status: DC

## 2023-09-23 NOTE — Patient Instructions (Signed)
 Medication Instructions:  Start Spironolactone 12.5 mg once a day  *If you need a refill on your cardiac medications before your next appointment, please call your pharmacy*  Lab Work: Today we are going to draw a Bmet If you have labs (blood work) drawn today and your tests are completely normal, you will receive your results only by: MyChart Message (if you have MyChart) OR A paper copy in the mail If you have any lab test that is abnormal or we need to change your treatment, we will call you to review the results.  Testing/Procedures: No testing  Follow-Up: At [facility], you and your health needs are our priority.  As part of our continuing mission to provide you with exceptional heart care, we have created designated Provider Care Teams.  These Care Teams include your primary Cardiologist (physician) and Advanced Practice Providers (APPs -  Physician Assistants and Nurse Practitioners) who all work together to provide you with the care you need, when you need it.  We recommend signing up for the patient portal called "MyChart".  Sign up information is provided on this After Visit Summary.  MyChart is used to connect with patients for Virtual Visits (Telemedicine).  Patients are able to view lab/test results, encounter notes, upcoming appointments, etc.  Non-urgent messages can be sent to your provider as well.   To learn more about what you can do with MyChart, go to [email or web address].    Your next appointment:   Already Scheduled  Please take your blood pressure daily for 3 weeks and send in a MyChart message. Please include heart rates. (One message at the end of the 3 weeks).   HOW TO TAKE YOUR BLOOD PRESSURE: Rest 5 minutes before taking your blood pressure. Don't smoke or drink caffeinated beverages for at least 30 minutes before. Take your blood pressure before (not after) you eat. Sit comfortably with your back supported and both feet on the floor (don't  cross your legs). Elevate your arm to heart level on a table or a desk. Use the proper sized cuff. It should fit smoothly and snugly around your bare upper arm. There should be enough room to slip a fingertip under the cuff. The bottom edge of the cuff should be 1 inch above the crease of the elbow. Ideally, take 3 measurements at one sitting and record the average.

## 2023-09-24 LAB — BASIC METABOLIC PANEL
BUN/Creatinine Ratio: 21 (ref 12–28)
BUN: 22 mg/dL (ref 10–36)
CO2: 21 mmol/L (ref 20–29)
Calcium: 9.7 mg/dL (ref 8.7–10.3)
Chloride: 101 mmol/L (ref 96–106)
Creatinine, Ser: 1.04 mg/dL — ABNORMAL HIGH (ref 0.57–1.00)
Glucose: 96 mg/dL (ref 70–99)
Potassium: 4.5 mmol/L (ref 3.5–5.2)
Sodium: 137 mmol/L (ref 134–144)
eGFR: 50 mL/min/{1.73_m2} — ABNORMAL LOW (ref >59)

## 2023-09-25 ENCOUNTER — Telehealth: Payer: Self-pay | Admitting: Cardiology

## 2023-09-25 NOTE — Telephone Encounter (Signed)
 After her appointment yesterday, Dr. Royann Shivers recommended offering farxiga/jardiance rather than PRN lasix. I called patient to discuss her lab results (which showed stable kidney function and normal electrolytes). Discussed that we could try SGLT2i to help manage her fluid rather than using lasix. At this time, patient prefers to try to take lasix 20 mg PRN.   Reiterated that we had started spironolactone 12.5 mg daily yesterday. We also stopped her potassium supplementation as she is now on spiro and does not take lasix every day. Patient voiced understanding about these medication changes. She is going to keep a BP log until her follow up on 4/7.   Jonita Albee, PA-C 09/25/2023 10:24 AM

## 2023-10-13 NOTE — Progress Notes (Unsigned)
 Cardiology Office Note    Date:  10/16/2023  ID:  Kathryn [name], DOB 02-01-1928, MRN [id number] PCP:  Rodrigo Ran, MD  Cardiologist:  Thurmon Fair, MD  Electrophysiologist:  None   Chief Complaint: Follow up for hypertension   History of Present Illness: .    Kathryn [name] is a 89 y.o. female with visit-pertinent history of paroxysmal atrial fibrillation, hypertension, complete heart block s/p pacemaker.  Kathryn [name] has history of complete heart block, she has a dual-chamber pacemaker implantation in 2000.  She later had a generator change out in 2008.  In 2000 she presented with what appeared to be an acute anterior wall myocardial infarction following episode of bradycardia related to ventricular fibrillation episode during a treadmill stress test.  She underwent emergent cardiac catheterization and was found to have normal coronary arteries with a Takotsubo cardiomyopathy.  She also has a known history of paroxysmal atrial fibrillation, has declined oral anticoagulants in the past repeatedly.  She underwent a another pacemaker generator change out in 2017.  She had visual disturbance in 02/2016, underwent carotid ultrasound and 02/2016 that showed less than 40% stenosis in bilateral internal carotid arteries.  Kathryn [name] was seen in clinic by Dr. Royann Shivers in 07/2023 for pacemaker check.  At that time Kathryn [name] denies shortness of breath, chest pain, palpitations, dizziness or syncope.  Interrogation of her pacemaker showed that her burden of atrial fibrillation was up to 11%.  She was asymptomatic when in atrial fibrillation.  When not in atrial fibrillation she was virtually 100% atrial pacing.  She continued to decline the use of oral anticoagulants.  In 08/2023 she presented for evaluation of progressive lower extremity edema and dyspnea that had been going on for the past 2 months.  She also reported having some dizziness and balance issues that began after a fall in 04/2023.  She  was started on Lasix 40 mg daily and potassium supplementation.  Per notes she took Lasix and potassium for a week and a half, felt much improved.  It was down 10 pounds.  Echocardiogram on 3//25 showed EF 60 to 65%, grade 3 DD, normal RV function, mild MR, mild-moderate TR and mild AS.  She was last seen in clinic on 09/23/2023 for follow-up appointment.  She noted a few concerns including complaints of general weakness, high blood pressure and swelling in the ankles.  She noted she had weakness have been ongoing for a while and it was particularly noticeable when she was taking Lasix.  She reported she had not noted improvement in her energy levels after stopping Lasix.  Since stopping Lasix she noticed decrease in urination and increase in swelling in the ankles.  She also reported increased shortness of breath, particularly when talking but denied any coughing or chest pain.  Today she presents for follow up. She reports that she is overall doing well. She reports that her blood pressures at home have improved, denies any recent dizziness or lightheadedness.  She denies any chest pain, shortness of breath.  She reports that her lower extremity edema has also significantly improved.  She notes that she has been monitoring her blood pressures at home, unfortunately she left her blood pressure log at home today. ROS: .   Today she denies chest pain, shortness of breath, lower extremity edema, fatigue, palpitations, melena, hematuria, hemoptysis, diaphoresis, weakness, presyncope, syncope, orthopnea, and PND.  All other systems are reviewed and otherwise negative. Studies Reviewed: Marland Kitchen   EKG:  EKG is not ordered  today.  CV Studies: Cardiac studies reviewed are outlined and summarized above. Otherwise please see EMR for full report. Cardiac Studies & Procedures   ______________________________________________________________________________________________     ECHOCARDIOGRAM  ECHOCARDIOGRAM COMPLETE  09/10/2023  Narrative ECHOCARDIOGRAM REPORT    Kathryn [name] Name:   Kathryn [name] Date of Exam: 09/10/2023 Medical Rec #:  [id number]      Height:       64.0 in Accession #:    [id number]     Weight:       127.0 lb Date of Birth:  13-Apr-1928     BSA:          1.613 m Kathryn [name] Age:    89 years       BP:           158/92 mmHg Kathryn [name] Gender: F              HR:           78 bpm. Exam Location:  [address]  Procedure: 2D Echo, Color Doppler, Cardiac Doppler and 3D Echo (Both Spectral and Color Flow Doppler were utilized during procedure).  Indications:    Complete Heart Block I44.2  History:        Kathryn [name] has prior history of Echocardiogram examinations, most recent 10/09/2011. Pacemaker; Risk Factors:Hypertension.  Sonographer:    Thurman Coyer RDCS Referring Phys: [id number] Jonita Albee  IMPRESSIONS   1. Left ventricular ejection fraction, by estimation, is 60 to 65%. Left ventricular ejection fraction by 3D volume is 62 %. The left ventricle has normal function. The left ventricle demonstrates regional wall motion abnormalities (see scoring diagram/findings for description). Left ventricular diastolic parameters are consistent with Grade III diastolic dysfunction (restrictive). 2. Right ventricular systolic function is normal. The right ventricular size is normal. There is normal pulmonary artery systolic pressure. 3. Left atrial size was mildly dilated. 4. Right atrial size was severely dilated. 5. Moderate pleural effusion in the right lateral region. 6. The mitral valve is normal in structure. Mild mitral valve regurgitation. No evidence of mitral stenosis. 7. Tricuspid valve regurgitation is mild to moderate. 8. The aortic valve is tricuspid. There is mild calcification of the aortic valve. There is mild thickening of the aortic valve. Aortic valve regurgitation is not visualized. Mild aortic valve stenosis. Aortic valve area, by VTI measures 1.34 cm. Aortic valve mean  gradient measures 10.0 mmHg. Aortic valve Vmax measures 2.11 m/s. 9. The inferior vena cava is normal in size with greater than 50% respiratory variability, suggesting right atrial pressure of 3 mmHg.  FINDINGS Left Ventricle: Left ventricular ejection fraction, by estimation, is 60 to 65%. Left ventricular ejection fraction by 3D volume is 62 %. The left ventricle has normal function. The left ventricle demonstrates regional wall motion abnormalities. The left ventricular internal cavity size was normal in size. There is no left ventricular hypertrophy. Left ventricular diastolic parameters are consistent with Grade III diastolic dysfunction (restrictive).   LV Wall Scoring: There is diffuse normal.  Right Ventricle: The right ventricular size is normal. No increase in right ventricular wall thickness. Right ventricular systolic function is normal. There is normal pulmonary artery systolic pressure. The tricuspid regurgitant velocity is 2.73 m/s, and with an assumed right atrial pressure of 3 mmHg, the estimated right ventricular systolic pressure is 32.8 mmHg.  Left Atrium: Left atrial size was mildly dilated.  Right Atrium: Right atrial size was severely dilated.  Pericardium: There is no evidence of pericardial effusion.  Mitral Valve: The  mitral valve is normal in structure. Mild mitral annular calcification. Mild mitral valve regurgitation. No evidence of mitral valve stenosis.  Tricuspid Valve: The tricuspid valve is normal in structure. Tricuspid valve regurgitation is mild to moderate. No evidence of tricuspid stenosis.  Aortic Valve: The aortic valve is tricuspid. There is mild calcification of the aortic valve. There is mild thickening of the aortic valve. Aortic valve regurgitation is not visualized. Mild aortic stenosis is present. Aortic valve mean gradient measures 10.0 mmHg. Aortic valve peak gradient measures 17.8 mmHg. Aortic valve area, by VTI measures 1.34  cm.  Pulmonic Valve: The pulmonic valve was normal in structure. Pulmonic valve regurgitation is mild. No evidence of pulmonic stenosis.  Aorta: The aortic root is normal in size and structure.  Venous: The inferior vena cava is normal in size with greater than 50% respiratory variability, suggesting right atrial pressure of 3 mmHg.  IAS/Shunts: No atrial level shunt detected by color flow Doppler.  Additional Comments: 3D was performed not requiring image post processing on an independent workstation and was normal. A device lead is visualized. There is a moderate pleural effusion in the right lateral region.   LEFT VENTRICLE PLAX 2D LVIDd:         4.20 cm         Diastology LVIDs:         3.00 cm         LV e' medial:   7.50 cm/s LV PW:         0.90 cm         LV E/e' medial: 18.8 LV IVS:        0.70 cm LVOT diam:     2.10 cm LV SV:         58              3D Volume EF LV SV Index:   36              LV 3D EF:    Left LVOT Area:     3.46 cm                     ventricul ar ejection fraction by 3D volume is 62 %.  3D Volume EF: 3D EF:        62 % LV EDV:       95 ml LV ESV:       36 ml LV SV:        59 ml  RIGHT VENTRICLE             IVC RV Basal diam:  3.50 cm     IVC diam: 1.80 cm RV Mid diam:    3.00 cm RV S prime:     10.10 cm/s TAPSE (M-mode): 2.8 cm  LEFT ATRIUM           Index        RIGHT ATRIUM           Index LA diam:      2.90 cm 1.80 cm/m   RA Area:     24.40 cm LA Vol (A2C): 37.5 ml 23.25 ml/m  RA Volume:   82.00 ml  50.84 ml/m LA Vol (A4C): 59.8 ml 37.07 ml/m AORTIC VALVE AV Area (Vmax):    1.31 cm AV Area (Vmean):   1.28 cm AV Area (VTI):     1.34 cm AV Vmax:           211.00  cm/s AV Vmean:          146.000 cm/s AV VTI:            0.434 m AV Peak Grad:      17.8 mmHg AV Mean Grad:      10.0 mmHg LVOT Vmax:         79.70 cm/s LVOT Vmean:        54.000 cm/s LVOT VTI:          0.168 m LVOT/AV VTI ratio: 0.39  AORTA Ao Root diam: 3.20  cm Ao Asc diam:  3.00 cm  MITRAL VALVE                TRICUSPID VALVE MV Area (PHT): 5.13 cm     TR Peak grad:   29.8 mmHg MV Decel Time: 148 msec     TR Vmax:        273.00 cm/s MR Peak grad: 120.1 mmHg MR Mean grad: 72.0 mmHg     SHUNTS MR Vmax:      548.00 cm/s   Systemic VTI:  0.17 m MR Vmean:     399.0 cm/s    Systemic Diam: 2.10 cm MV E velocity: 141.00 cm/s MV A velocity: 53.60 cm/s MV E/A ratio:  2.63  Chilton Si MD Electronically signed by Chilton Si MD Signature Date/Time: 09/10/2023/5:50:32 PM    Final          ______________________________________________________________________________________________       Current Reported Medications:.    Current Meds  Medication Sig   acetaminophen (TYLENOL) 500 MG tablet Take 1 tablet (500 mg total) by mouth every 6 (six) hours as needed.   Ascorbic Acid (VITAMIN C PO) Take 1 tablet by mouth daily.   aspirin EC 81 MG tablet Take 81 mg by mouth every morning.    Calcium 200 MG TABS Take 200 mg by mouth 2 (two) times daily.   calcium carbonate (SUPER CALCIUM) 1500 (600 Ca) MG TABS tablet    carvedilol (COREG) 12.5 MG tablet Take 1 tablet (12.5 mg total) by mouth 2 (two) times daily.   cholecalciferol (VITAMIN D) 1000 units tablet Take 1,000 Units by mouth daily.   CRANBERRY PO Take 1 capsule by mouth daily in the afternoon.   erythromycin ophthalmic ointment Place 1 Application into the left eye 3 (three) times daily.   EVENING PRIMROSE OIL PO Take 1 capsule by mouth daily in the afternoon.   furosemide (LASIX) 40 MG tablet Take 0.5 tablets (20 mg total) by mouth as needed (for swelling).   mupirocin ointment (BACTROBAN) 2 %    olmesartan (BENICAR) 40 MG tablet Take 40 mg by mouth daily.   Omega 3 340 MG CPDR Take one tablet by mouth twice daily  but only occas. use in 2016 Oral   Polyethyl Glycol-Propyl Glycol (SYSTANE OP) Place 1 drop into both eyes daily as needed (dry eyes).   spironolactone (ALDACTONE)  25 MG tablet Take 0.5 tablets (12.5 mg total) by mouth daily.   TART CHERRY PO Take 3 capsules by mouth daily in the afternoon. Take 1-3 tablets daily   vitamin B-12 (CYANOCOBALAMIN) 1000 MCG tablet Take 1,000 mcg by mouth daily.   zinc gluconate 50 MG tablet Take 50 mg by mouth daily.   Physical Exam:    VS:  BP (!) 146/86   Pulse 78   Ht 5\' 4"  (1.626 m)   Wt 118 lb (53.5 kg)   SpO2 98%   BMI 20.25 kg/m  Wt Readings from Last 3 Encounters:  10/14/23 118 lb (53.5 kg)  09/23/23 122 lb (55.3 kg)  08/23/23 127 lb (57.6 kg)    GEN: Well nourished, well developed in no acute distress NECK: No JVD; No carotid bruits CARDIAC: RRR, no murmurs, rubs, gallops RESPIRATORY:  Clear to auscultation without rales, wheezing or rhonchi  ABDOMEN: Soft, non-tender, non-distended EXTREMITIES:  No edema; No acute deformity     Asessement and Plan:.    Chronic diastolic HF: In 08/2023 Kathryn [name] reported increased shortness of breath and ankle edema, started on Lasix 40 mg daily, later transition to 20 mg as needed.  Echocardiogram on 09/10/23 showed EF 60 to 65%, grade 3 DD, normal RV function.  At last office visit she was started on spironolactone 12.5 mg daily. Today she appears euvolemic and well compensated on exam.  Kathryn [name] reports that her lower extremity edema has resolved.  She notes that she is tolerating spironolactone well.  Discussed starting on SGLT2 inhibitor, Kathryn [name] declined.  Reviewed ED precautions. Check BMET today.   Complete HB: s/p pacemaker.  Pacemaker monitored by Dr. Royann Shivers.  Device check on 3//25 showed patent had normal device function and was pacemaker dependent.  Persistent atrial fibrillation: Kathryn [name]'s burden has been increasing over time, check in 07/2023 showed 11% atrial fibrillation burden.  When checked in 08/2023 she was in persistent atrial fibrillation since 2/1 and had an overall 85% A-fib burden.  Kathryn [name] is asymptomatic and unaware of atrial fibrillation.  Kathryn [name]  continues to refuse anticoagulation. CHA2DS2-VASc Score = 5 [CHF History: 1, HTN History: 1, Diabetes History: 0, Stroke History: 0, Vascular Disease History: 0, Age Score: 2, Gender Score: 1].  Therefore, the Kathryn [name]'s annual risk of stroke is 7.2 %.  Reviewed ED and strict precautions. Continue carvedilol 12.5 mg twice daily and aspirin 81 mg daily.  Mild-Moderate TR/Mild MR/Mild AS: Noted on echocardiogram in 09/2023.  Continue as needed Lasix and spironolactone 12.5 mg daily.  Hypertension: Blood pressure today 146/86. Kathryn [name] has been allowed to have permissive hypertension given history of dizziness when walking long distances and associated shortness of breath. At last office visit Kathryn [name] reported blood pressure been elevated to the 150s over 80s.  She was started on spironolactone 12.5 mg daily. Check BMET today.  Kathryn [name] notes today that she is not interested in any further medication changes.  She notes that she does not care to monitor her blood pressure at home as it does cause her some discomfort.  Reviewed ED precautions. Continue carvedilol 12.5 mg twice daily, spironolactone 12.5 mg daily and olmesartan 40 mg daily.    Disposition: F/u with Reather Littler, NP in two months or sooner if needed.   Signed, Rip Harbour, NP

## 2023-10-14 ENCOUNTER — Ambulatory Visit: Attending: Cardiology | Admitting: Cardiology

## 2023-10-14 ENCOUNTER — Encounter: Payer: Self-pay | Admitting: Cardiology

## 2023-10-14 VITALS — BP 146/86 | HR 78 | Ht 64.0 in | Wt 118.0 lb

## 2023-10-14 DIAGNOSIS — I5032 Chronic diastolic (congestive) heart failure: Secondary | ICD-10-CM

## 2023-10-14 DIAGNOSIS — I4819 Other persistent atrial fibrillation: Secondary | ICD-10-CM | POA: Diagnosis not present

## 2023-10-14 DIAGNOSIS — I340 Nonrheumatic mitral (valve) insufficiency: Secondary | ICD-10-CM

## 2023-10-14 DIAGNOSIS — I10 Essential (primary) hypertension: Secondary | ICD-10-CM

## 2023-10-14 DIAGNOSIS — I361 Nonrheumatic tricuspid (valve) insufficiency: Secondary | ICD-10-CM

## 2023-10-14 DIAGNOSIS — I442 Atrioventricular block, complete: Secondary | ICD-10-CM

## 2023-10-14 NOTE — Patient Instructions (Signed)
 Medication Instructions:  No changes *If you need a refill on your cardiac medications before your next appointment, please call your pharmacy*  Lab Work: Today we are going to draw a Bmet If you have labs (blood work) drawn today and your tests are completely normal, you will receive your results only by: MyChart Message (if you have MyChart) OR A paper copy in the mail If you have any lab test that is abnormal or we need to change your treatment, we will call you to review the results.  Testing/Procedures: No testing  Follow-Up: At [facility], you and your health needs are our priority.  As part of our continuing mission to provide you with exceptional heart care, our providers are all part of one team.  This team includes your primary Cardiologist (physician) and Advanced Practice Providers or APPs (Physician Assistants and Nurse Practitioners) who all work together to provide you with the care you need, when you need it.  Your next appointment:   2 month(s)  Provider:   Reather Littler, NP  We recommend signing up for the patient portal called "MyChart".  Sign up information is provided on this After Visit Summary.  MyChart is used to connect with patients for Virtual Visits (Telemedicine).  Patients are able to view lab/test results, encounter notes, upcoming appointments, etc.  Non-urgent messages can be sent to your provider as well.   To learn more about what you can do with MyChart, go to [email or web address].   Other Instructions If blood pressure is consistently greater than 150/90 please reach out to the office.   1st Floor: - Lobby - Registration  - Pharmacy  - Lab - Cafe  2nd Floor: - PV Lab - Diagnostic Testing (echo, CT, nuclear med)  3rd Floor: - Vacant  4th Floor: - TCTS (cardiothoracic surgery) - AFib Clinic - Structural Heart Clinic - Vascular Surgery  - Vascular Ultrasound  5th Floor: - [facility] (general and EP) -  Clinical Pharmacy for coumadin, hypertension, lipid, weight-loss medications, and med management appointments    Valet parking services will be available as well.

## 2023-10-15 LAB — BASIC METABOLIC PANEL WITH GFR
BUN/Creatinine Ratio: 23 (ref 12–28)
BUN: 28 mg/dL (ref 10–36)
CO2: 22 mmol/L (ref 20–29)
Calcium: 10.3 mg/dL (ref 8.7–10.3)
Chloride: 101 mmol/L (ref 96–106)
Creatinine, Ser: 1.24 mg/dL — ABNORMAL HIGH (ref 0.57–1.00)
Glucose: 87 mg/dL (ref 70–99)
Potassium: 5 mmol/L (ref 3.5–5.2)
Sodium: 139 mmol/L (ref 134–144)
eGFR: 41 mL/min/{1.73_m2} — ABNORMAL LOW (ref >59)

## 2023-10-15 NOTE — Progress Notes (Signed)
 Remote pacemaker transmission.

## 2023-10-15 NOTE — Addendum Note (Signed)
 Addended by: Geralyn Flash D on: 10/15/2023 04:50 PM   Modules accepted: Orders

## 2023-10-16 ENCOUNTER — Telehealth: Payer: Self-pay | Admitting: Cardiology

## 2023-10-16 ENCOUNTER — Encounter: Payer: Self-pay | Admitting: Cardiology

## 2023-10-16 NOTE — Telephone Encounter (Signed)
 Pt c/o medication issue:  1. Name of Medication: Carvedilol  2. How are you currently taking this medication (dosage and times per day)?   3. Are you having a reaction (difficulty breathing--STAT)?   4. What is your medication issue? She needs to discuss the new directions- they changed the old directions

## 2023-10-16 NOTE — Telephone Encounter (Signed)
 Patient identification verified by 2 forms. Marilynn Rail, RN    Called and spoke to patient  Patient states:   -received Rx for Carvedilol 6.25mg    -does not remember when Rx was changed   -would like to confirm dose for carvedilol Informed patient:   -Carvedilol increase to 12.5 mg BID on 08/23/23   -no changes have been made to Carvedilol Rx  Patient states she will contact pharmacy  Patient has no further questions at this time

## 2023-10-22 ENCOUNTER — Telehealth: Payer: Self-pay

## 2023-10-22 DIAGNOSIS — I4819 Other persistent atrial fibrillation: Secondary | ICD-10-CM

## 2023-10-22 DIAGNOSIS — I442 Atrioventricular block, complete: Secondary | ICD-10-CM

## 2023-10-22 NOTE — Telephone Encounter (Signed)
 Called patient advised of below they verbalized understanding Ordered Labs

## 2023-10-22 NOTE — Telephone Encounter (Signed)
-----   Message from [name] sent at 10/17/2023 11:11 AM EDT ----- Please let Kathryn [name] know that she had a slight reduction in kidney function, this is not unexpected with recently starting on spironolactone but I would like to recheck a bmet in 2 weeks to continue monitoring.  Please remind her to stay hydrated with water.  Her electrolytes are normal.  Continue current medications and follow-up as planned.

## 2023-12-15 NOTE — Progress Notes (Unsigned)
 Cardiology Office Note    Date:  12/15/2023  ID:  Kathryn [name], DOB January 02, 1928, MRN [id number] PCP:  Aldo Hun, MD  Cardiologist:  Luana Rumple, MD  Electrophysiologist:  None   Chief Complaint: ***  History of Present Illness: .    Kathryn [name] is a 89 y.o. female with visit-pertinent history of paroxysmal atrial fibrillation, hypertension, complete heart block s/p pacemaker.  Ms. Ranum has history of complete heart block, she has a dual-chamber pacemaker implantation in 2000.  She later had a generator change out in 2008.  In 2000 she presented with what appeared to be an acute anterior wall myocardial infarction following episode of bradycardia related to ventricular fibrillation episode during a treadmill stress test.  She underwent emergent cardiac catheterization and was found to have normal coronary arteries with a Takotsubo cardiomyopathy.  She also has a known history of paroxysmal atrial fibrillation, has declined oral anticoagulants in the past repeatedly.  She underwent a another pacemaker generator change out in 2017.  She had visual disturbance in 02/2016, underwent carotid ultrasound and 02/2016 that showed less than 40% stenosis in bilateral internal carotid arteries.  Patient was seen in clinic by Dr. Alvis Ba in 07/2023 for pacemaker check.  At that time patient denies shortness of breath, chest pain, palpitations, dizziness or syncope.  Interrogation of her pacemaker showed that her burden of atrial fibrillation was up to 11%.  She was asymptomatic when in atrial fibrillation.  When not in atrial fibrillation she was virtually 100% atrial pacing.  She continued to decline the use of oral anticoagulants.  In 08/2023 she presented for evaluation of progressive lower extremity edema and dyspnea that had been going on for the past 2 months.  She also reported having some dizziness and balance issues that began after a fall in 04/2023.  She was started on Lasix  40  mg daily and potassium supplementation.  Per notes she took Lasix  and potassium for a week and a half, felt much improved.  It was down 10 pounds.  Echocardiogram on 3//25 showed EF 60 to 65%, grade 3 DD, normal RV function, mild MR, mild-moderate TR and mild AS.  She was last seen in clinic on 09/23/2023 for follow-up appointment.  She noted a few concerns including complaints of general weakness, high blood pressure and swelling in the ankles.  She noted she had weakness have been ongoing for a while and it was particularly noticeable when she was taking Lasix .  She reported she had not noted improvement in her energy levels after stopping Lasix .  Since stopping Lasix  she noticed decrease in urination and increase in swelling in the ankles.  She also reported increased shortness of breath, particularly when talking but denied any coughing or chest pain.   Patient was last seen in clinic on/01/2024.  She reported that she was overall doing well.  She reported that her lower extremity edema had significantly improved, she also reported her blood pressure had improved however she did not have the pressure log for review available.  Today she presents for follow-up.  She reports that she  Chronic diastolic HF: In 08/2023 patient reported increased shortness of breath and ankle edema, started on Lasix  40 mg daily, later transition to 20 mg as needed.  Echocardiogram on 09/10/23 showed EF 60 to 65%, grade 3 DD, normal RV function.  At last office visit she was started on spironolactone  12.5 mg daily.  Complete HB: s/p pacemaker.  Pacemaker monitored by Dr. Alvis Ba.  Device check on 3//25 showed patent had normal device function and was pacemaker dependent. Persistent atrial fibrillation: Patient's burden has been increasing over time, check in 1/25 showed 1% atrial fibrillation burden.  When checked in 08/2023 she was in persistent atrial fibrillation since 6/21 and had an overall 85% A-fib burden.  Patient was  asymptomatic and unaware of atrial fibrillation.  Patient has consistently refused anticoagulation in the past. Continue carvedilol  12.5 mg twice daily and aspirin 81 mg daily. Mild to moderate TR/mild MR/mild AS: Noted on echocardiogram 09/2023.  Continue as needed Lasix  and spironolactone  12.5 mg daily. Hypertension: Blood pressure today 146/86.  Patient has been permitted to have permissive hypertension given history of dizziness when walking long distances and associated shortness of breath.    Labwork independently reviewed:   ROS: .   *** denies chest pain, shortness of breath, lower extremity edema, fatigue, palpitations, melena, hematuria, hemoptysis, diaphoresis, weakness, presyncope, syncope, orthopnea, and PND.  All other systems are reviewed and otherwise negative.  Studies Reviewed: Aaron Aas    EKG:  EKG is ordered today, personally reviewed, demonstrating ***     CV Studies: Cardiac studies reviewed are outlined and summarized above. Otherwise please see EMR for full report. Cardiac Studies & Procedures   ______________________________________________________________________________________________     ECHOCARDIOGRAM  ECHOCARDIOGRAM COMPLETE 09/10/2023  Narrative ECHOCARDIOGRAM REPORT    Patient Name:   Kathryn [name] Date of Exam: 09/10/2023 Medical Rec #:  [id number]      Height:       64.0 in Accession #:    [id number]     Weight:       127.0 lb Date of Birth:  1927-11-19     BSA:          1.613 m Patient Age:    89 years       BP:           158/92 mmHg Patient Gender: F              HR:           78 bpm. Exam Location:  [address]  Procedure: 2D Echo, Color Doppler, Cardiac Doppler and 3D Echo (Both Spectral and Color Flow Doppler were utilized during procedure).  Indications:    Complete Heart Block I44.2  History:        Patient has prior history of Echocardiogram examinations, most recent 10/09/2011. Pacemaker; Risk Factors:Hypertension.  Sonographer:    Joleen Navy RDCS Referring Phys: [id number] Debria Fang  IMPRESSIONS   1. Left ventricular ejection fraction, by estimation, is 60 to 65%. Left ventricular ejection fraction by 3D volume is 62 %. The left ventricle has normal function. The left ventricle demonstrates regional wall motion abnormalities (see scoring diagram/findings for description). Left ventricular diastolic parameters are consistent with Grade III diastolic dysfunction (restrictive). 2. Right ventricular systolic function is normal. The right ventricular size is normal. There is normal pulmonary artery systolic pressure. 3. Left atrial size was mildly dilated. 4. Right atrial size was severely dilated. 5. Moderate pleural effusion in the right lateral region. 6. The mitral valve is normal in structure. Mild mitral valve regurgitation. No evidence of mitral stenosis. 7. Tricuspid valve regurgitation is mild to moderate. 8. The aortic valve is tricuspid. There is mild calcification of the aortic valve. There is mild thickening of the aortic valve. Aortic valve regurgitation is not visualized. Mild aortic valve stenosis. Aortic valve area, by VTI measures 1.34 cm. Aortic valve mean gradient measures 10.0  mmHg. Aortic valve Vmax measures 2.11 m/s. 9. The inferior vena cava is normal in size with greater than 50% respiratory variability, suggesting right atrial pressure of 3 mmHg.  FINDINGS Left Ventricle: Left ventricular ejection fraction, by estimation, is 60 to 65%. Left ventricular ejection fraction by 3D volume is 62 %. The left ventricle has normal function. The left ventricle demonstrates regional wall motion abnormalities. The left ventricular internal cavity size was normal in size. There is no left ventricular hypertrophy. Left ventricular diastolic parameters are consistent with Grade III diastolic dysfunction (restrictive).   LV Wall Scoring: There is diffuse normal.  Right Ventricle: The right ventricular  size is normal. No increase in right ventricular wall thickness. Right ventricular systolic function is normal. There is normal pulmonary artery systolic pressure. The tricuspid regurgitant velocity is 2.73 m/s, and with an assumed right atrial pressure of 3 mmHg, the estimated right ventricular systolic pressure is 32.8 mmHg.  Left Atrium: Left atrial size was mildly dilated.  Right Atrium: Right atrial size was severely dilated.  Pericardium: There is no evidence of pericardial effusion.  Mitral Valve: The mitral valve is normal in structure. Mild mitral annular calcification. Mild mitral valve regurgitation. No evidence of mitral valve stenosis.  Tricuspid Valve: The tricuspid valve is normal in structure. Tricuspid valve regurgitation is mild to moderate. No evidence of tricuspid stenosis.  Aortic Valve: The aortic valve is tricuspid. There is mild calcification of the aortic valve. There is mild thickening of the aortic valve. Aortic valve regurgitation is not visualized. Mild aortic stenosis is present. Aortic valve mean gradient measures 10.0 mmHg. Aortic valve peak gradient measures 17.8 mmHg. Aortic valve area, by VTI measures 1.34 cm.  Pulmonic Valve: The pulmonic valve was normal in structure. Pulmonic valve regurgitation is mild. No evidence of pulmonic stenosis.  Aorta: The aortic root is normal in size and structure.  Venous: The inferior vena cava is normal in size with greater than 50% respiratory variability, suggesting right atrial pressure of 3 mmHg.  IAS/Shunts: No atrial level shunt detected by color flow Doppler.  Additional Comments: 3D was performed not requiring image post processing on an independent workstation and was normal. A device lead is visualized. There is a moderate pleural effusion in the right lateral region.   LEFT VENTRICLE PLAX 2D LVIDd:         4.20 cm         Diastology LVIDs:         3.00 cm         LV e' medial:   7.50 cm/s LV PW:          0.90 cm         LV E/e' medial: 18.8 LV IVS:        0.70 cm LVOT diam:     2.10 cm LV SV:         58              3D Volume EF LV SV Index:   36              LV 3D EF:    Left LVOT Area:     3.46 cm                     ventricul ar ejection fraction by 3D volume is 62 %.  3D Volume EF: 3D EF:        62 % LV EDV:       95  ml LV ESV:       36 ml LV SV:        59 ml  RIGHT VENTRICLE             IVC RV Basal diam:  3.50 cm     IVC diam: 1.80 cm RV Mid diam:    3.00 cm RV S prime:     10.10 cm/s TAPSE (M-mode): 2.8 cm  LEFT ATRIUM           Index        RIGHT ATRIUM           Index LA diam:      2.90 cm 1.80 cm/m   RA Area:     24.40 cm LA Vol (A2C): 37.5 ml 23.25 ml/m  RA Volume:   82.00 ml  50.84 ml/m LA Vol (A4C): 59.8 ml 37.07 ml/m AORTIC VALVE AV Area (Vmax):    1.31 cm AV Area (Vmean):   1.28 cm AV Area (VTI):     1.34 cm AV Vmax:           211.00 cm/s AV Vmean:          146.000 cm/s AV VTI:            0.434 m AV Peak Grad:      17.8 mmHg AV Mean Grad:      10.0 mmHg LVOT Vmax:         79.70 cm/s LVOT Vmean:        54.000 cm/s LVOT VTI:          0.168 m LVOT/AV VTI ratio: 0.39  AORTA Ao Root diam: 3.20 cm Ao Asc diam:  3.00 cm  MITRAL VALVE                TRICUSPID VALVE MV Area (PHT): 5.13 cm     TR Peak grad:   29.8 mmHg MV Decel Time: 148 msec     TR Vmax:        273.00 cm/s MR Peak grad: 120.1 mmHg MR Mean grad: 72.0 mmHg     SHUNTS MR Vmax:      548.00 cm/s   Systemic VTI:  0.17 m MR Vmean:     399.0 cm/s    Systemic Diam: 2.10 cm MV E velocity: 141.00 cm/s MV A velocity: 53.60 cm/s MV E/A ratio:  2.63  Maudine Sos MD Electronically signed by Maudine Sos MD Signature Date/Time: 09/10/2023/5:50:32 PM    Final          ______________________________________________________________________________________________       Current Reported Medications:.    No outpatient medications have been marked as taking for the 12/17/23  encounter (Appointment) with Eternity Dexter D, NP.    Physical Exam:    VS:  There were no vitals taken for this visit.   Wt Readings from Last 3 Encounters:  10/14/23 118 lb (53.5 kg)  09/23/23 122 lb (55.3 kg)  08/23/23 127 lb (57.6 kg)    GEN: Well nourished, well developed in no acute distress NECK: No JVD; No carotid bruits CARDIAC: ***RRR, no murmurs, rubs, gallops RESPIRATORY:  Clear to auscultation without rales, wheezing or rhonchi  ABDOMEN: Soft, non-tender, non-distended EXTREMITIES:  No edema; No acute deformity     Asessement and Plan:.     ***     Disposition: F/u with ***  Signed, Dalonda Simoni D Hanaan Gancarz, NP

## 2023-12-17 ENCOUNTER — Ambulatory Visit: Attending: Cardiology | Admitting: Cardiology

## 2023-12-17 ENCOUNTER — Encounter: Payer: Self-pay | Admitting: Cardiology

## 2023-12-17 VITALS — BP 148/78 | HR 69 | Ht 64.0 in | Wt 115.8 lb

## 2023-12-17 DIAGNOSIS — I5032 Chronic diastolic (congestive) heart failure: Secondary | ICD-10-CM | POA: Diagnosis not present

## 2023-12-17 DIAGNOSIS — I361 Nonrheumatic tricuspid (valve) insufficiency: Secondary | ICD-10-CM | POA: Diagnosis not present

## 2023-12-17 DIAGNOSIS — I340 Nonrheumatic mitral (valve) insufficiency: Secondary | ICD-10-CM

## 2023-12-17 DIAGNOSIS — I4819 Other persistent atrial fibrillation: Secondary | ICD-10-CM

## 2023-12-17 DIAGNOSIS — I10 Essential (primary) hypertension: Secondary | ICD-10-CM

## 2023-12-17 DIAGNOSIS — I442 Atrioventricular block, complete: Secondary | ICD-10-CM | POA: Diagnosis not present

## 2023-12-17 NOTE — Patient Instructions (Signed)
 Medication Instructions:  No changes *If you need a refill on your cardiac medications before your next appointment, please call your pharmacy*  Lab Work: No labs  Testing/Procedures: No testing  Follow-Up: At [facility], you and your health needs are our priority.  As part of our continuing mission to provide you with exceptional heart care, our providers are all part of one team.  This team includes your primary Cardiologist (physician) and Advanced Practice Providers or APPs (Physician Assistants and Nurse Practitioners) who all work together to provide you with the care you need, when you need it.  Your next appointment:   7 month(s)  Provider:   Luana Rumple, MD    We recommend signing up for the patient portal called "MyChart".  Sign up information is provided on this After Visit Summary.  MyChart is used to connect with patients for Virtual Visits (Telemedicine).  Patients are able to view lab/test results, encounter notes, upcoming appointments, etc.  Non-urgent messages can be sent to your provider as well.   To learn more about what you can do with MyChart, go to [email or web address].

## 2023-12-18 ENCOUNTER — Telehealth: Payer: Self-pay

## 2023-12-18 NOTE — Telephone Encounter (Signed)
 Looks like most recent transmission was received 09/10/23. [employer] spoke to patient 12/12/23 regarding missed transmission.

## 2023-12-18 NOTE — Telephone Encounter (Signed)
-----   Message from Ellie Gutta [place] sent at 12/17/2023  3:28 PM EDT ----- Regarding: Transmission Patient seen today in clinic, notes she was to have a device transmission last week. She has been struggling to send a transmission from her home device. Can someone reach out to assist her?   Thank you! Katlyn West, NP

## 2023-12-19 NOTE — Telephone Encounter (Signed)
 I spoke with the pt and ordered her a new monitor. She should receive it in 7-10 business days. She will send a transmission after she receive it.

## 2023-12-23 ENCOUNTER — Emergency Department (HOSPITAL_COMMUNITY)

## 2023-12-23 ENCOUNTER — Encounter (HOSPITAL_COMMUNITY): Payer: Self-pay

## 2023-12-23 ENCOUNTER — Ambulatory Visit: Admitting: Cardiology

## 2023-12-23 ENCOUNTER — Other Ambulatory Visit: Payer: Self-pay

## 2023-12-23 ENCOUNTER — Inpatient Hospital Stay (HOSPITAL_COMMUNITY)
Admission: EM | Admit: 2023-12-23 | Discharge: 2023-12-27 | DRG: 291 | Disposition: A | Discharge status: Skilled Nursing Facility | Attending: Internal Medicine | Admitting: Internal Medicine

## 2023-12-23 DIAGNOSIS — Z8249 Family history of ischemic heart disease and other diseases of the circulatory system: Secondary | ICD-10-CM

## 2023-12-23 DIAGNOSIS — D62 Acute posthemorrhagic anemia: Secondary | ICD-10-CM | POA: Diagnosis present

## 2023-12-23 DIAGNOSIS — Z23 Encounter for immunization: Secondary | ICD-10-CM

## 2023-12-23 DIAGNOSIS — S81811A Laceration without foreign body, right lower leg, initial encounter: Secondary | ICD-10-CM | POA: Diagnosis present

## 2023-12-23 DIAGNOSIS — E611 Iron deficiency: Secondary | ICD-10-CM | POA: Diagnosis present

## 2023-12-23 DIAGNOSIS — I130 Hypertensive heart and chronic kidney disease with heart failure and stage 1 through stage 4 chronic kidney disease, or unspecified chronic kidney disease: Secondary | ICD-10-CM | POA: Diagnosis not present

## 2023-12-23 DIAGNOSIS — I480 Paroxysmal atrial fibrillation: Secondary | ICD-10-CM | POA: Diagnosis present

## 2023-12-23 DIAGNOSIS — W19XXXA Unspecified fall, initial encounter: Principal | ICD-10-CM | POA: Diagnosis present

## 2023-12-23 DIAGNOSIS — I5033 Acute on chronic diastolic (congestive) heart failure: Secondary | ICD-10-CM | POA: Diagnosis present

## 2023-12-23 DIAGNOSIS — Y92000 Kitchen of unspecified non-institutional (private) residence as  the place of occurrence of the external cause: Secondary | ICD-10-CM

## 2023-12-23 DIAGNOSIS — Z950 Presence of cardiac pacemaker: Secondary | ICD-10-CM

## 2023-12-23 DIAGNOSIS — Z7982 Long term (current) use of aspirin: Secondary | ICD-10-CM

## 2023-12-23 DIAGNOSIS — N1831 Chronic kidney disease, stage 3a: Secondary | ICD-10-CM | POA: Diagnosis present

## 2023-12-23 DIAGNOSIS — S300XXA Contusion of lower back and pelvis, initial encounter: Secondary | ICD-10-CM | POA: Diagnosis present

## 2023-12-23 DIAGNOSIS — I739 Peripheral vascular disease, unspecified: Secondary | ICD-10-CM | POA: Diagnosis present

## 2023-12-23 DIAGNOSIS — M25552 Pain in left hip: Secondary | ICD-10-CM | POA: Diagnosis present

## 2023-12-23 DIAGNOSIS — K219 Gastro-esophageal reflux disease without esophagitis: Secondary | ICD-10-CM | POA: Diagnosis present

## 2023-12-23 DIAGNOSIS — Z90710 Acquired absence of both cervix and uterus: Secondary | ICD-10-CM

## 2023-12-23 DIAGNOSIS — Z79899 Other long term (current) drug therapy: Secondary | ICD-10-CM

## 2023-12-23 DIAGNOSIS — I10 Essential (primary) hypertension: Secondary | ICD-10-CM | POA: Diagnosis present

## 2023-12-23 DIAGNOSIS — Z888 Allergy status to other drugs, medicaments and biological substances status: Secondary | ICD-10-CM

## 2023-12-23 DIAGNOSIS — R58 Hemorrhage, not elsewhere classified: Secondary | ICD-10-CM | POA: Diagnosis not present

## 2023-12-23 DIAGNOSIS — S7012XA Contusion of left thigh, initial encounter: Secondary | ICD-10-CM | POA: Diagnosis present

## 2023-12-23 LAB — CBC WITH DIFFERENTIAL/PLATELET
Abs Immature Granulocytes: 0.05 10*3/uL (ref 0.00–0.07)
Basophils Absolute: 0.1 10*3/uL (ref 0.0–0.1)
Basophils Relative: 1 %
Eosinophils Absolute: 0.4 10*3/uL (ref 0.0–0.5)
Eosinophils Relative: 3 %
HCT: 31.2 % — ABNORMAL LOW (ref 36.0–46.0)
Hemoglobin: 10 g/dL — ABNORMAL LOW (ref 12.0–15.0)
Immature Granulocytes: 0 %
Lymphocytes Relative: 8 %
Lymphs Abs: 0.9 10*3/uL (ref 0.7–4.0)
MCH: 30.9 pg (ref 26.0–34.0)
MCHC: 32.1 g/dL (ref 30.0–36.0)
MCV: 96.3 fL (ref 80.0–100.0)
Monocytes Absolute: 1 10*3/uL (ref 0.1–1.0)
Monocytes Relative: 9 %
Neutro Abs: 9.3 10*3/uL — ABNORMAL HIGH (ref 1.7–7.7)
Neutrophils Relative %: 79 %
Platelets: 227 10*3/uL (ref 150–400)
RBC: 3.24 MIL/uL — ABNORMAL LOW (ref 3.87–5.11)
RDW: 13.8 % (ref 11.5–15.5)
WBC: 11.7 10*3/uL — ABNORMAL HIGH (ref 4.0–10.5)
nRBC: 0 % (ref 0.0–0.2)

## 2023-12-23 LAB — BASIC METABOLIC PANEL WITH GFR
Anion gap: 8 (ref 5–15)
BUN: 26 mg/dL — ABNORMAL HIGH (ref 8–23)
CO2: 24 mmol/L (ref 22–32)
Calcium: 9.3 mg/dL (ref 8.9–10.3)
Chloride: 104 mmol/L (ref 98–111)
Creatinine, Ser: 1.22 mg/dL — ABNORMAL HIGH (ref 0.44–1.00)
GFR, Estimated: 41 mL/min — ABNORMAL LOW (ref >60)
Glucose, Bld: 154 mg/dL — ABNORMAL HIGH (ref 70–99)
Potassium: 4.1 mmol/L (ref 3.5–5.1)
Sodium: 136 mmol/L (ref 135–145)

## 2023-12-23 NOTE — ED Provider Triage Note (Signed)
 Emergency Medicine Provider Triage Evaluation Note  Kathryn [name] , a 89 y.o. female  was evaluated in triage.  Pt complains of fall, bilateral leg injury, dizziness.  Patient states that she fell, she was getting around the stool in her kitchen.  She does not take blood thinners.  Patient has no chest pain.  Review of Systems  Positive: Dizziness, leg pain Negative: Headache, neck pain  Physical Exam  BP (!) 142/75 (BP Location: Right Arm)   Pulse 75   Temp 98 F (36.7 C) (Oral)   Resp 18   Ht 5' 4 (1.626 m)   Wt 52.5 kg   SpO2 98%   BMI 19.87 kg/m  Gen:   Awake, no distress   Resp:  Normal effort  MSK:   Moves extremities without difficulty  Other:  Focal tenderness over R tibia > L tibia, but no gross deformity  Medical Decision Making  Medically screening exam initiated at 11:35 PM.  Appropriate orders placed.  Kathryn [name] was informed that the remainder of the evaluation will be completed by another provider, this initial triage assessment does not replace that evaluation, and the importance of remaining in the ED until their evaluation is complete.  Plan is to get basic labs given the dizziness.  She has no midline C-spine tenderness.  Will get CT scan of the brain. Denies, patient indicated that she never struck her head.  Will still get CT head due to dizziness.     Deatra Face, MD 12/23/23 [id number]

## 2023-12-23 NOTE — ED Triage Notes (Addendum)
 Pt BIB [facility] from home for mechanical fall. No thinners, No LOC. Did not hit head. Large skin tear to R leg. VSS.  18gRAC fentanyl  180/74 70HR

## 2023-12-24 ENCOUNTER — Observation Stay (HOSPITAL_COMMUNITY)

## 2023-12-24 ENCOUNTER — Other Ambulatory Visit: Payer: Self-pay

## 2023-12-24 ENCOUNTER — Emergency Department (HOSPITAL_COMMUNITY)

## 2023-12-24 ENCOUNTER — Encounter (HOSPITAL_COMMUNITY): Payer: Self-pay

## 2023-12-24 DIAGNOSIS — S81811A Laceration without foreign body, right lower leg, initial encounter: Secondary | ICD-10-CM | POA: Diagnosis present

## 2023-12-24 DIAGNOSIS — I5033 Acute on chronic diastolic (congestive) heart failure: Secondary | ICD-10-CM | POA: Diagnosis present

## 2023-12-24 DIAGNOSIS — N1831 Chronic kidney disease, stage 3a: Secondary | ICD-10-CM | POA: Diagnosis present

## 2023-12-24 DIAGNOSIS — I10 Essential (primary) hypertension: Secondary | ICD-10-CM | POA: Diagnosis not present

## 2023-12-24 DIAGNOSIS — I480 Paroxysmal atrial fibrillation: Secondary | ICD-10-CM

## 2023-12-24 LAB — TROPONIN I (HIGH SENSITIVITY)
Troponin I (High Sensitivity): 12 ng/L (ref <18)
Troponin I (High Sensitivity): 12 ng/L (ref <18)

## 2023-12-24 LAB — BRAIN NATRIURETIC PEPTIDE: B Natriuretic Peptide: 500.4 pg/mL — ABNORMAL HIGH (ref 0.0–100.0)

## 2023-12-24 MED ORDER — TETANUS-DIPHTH-ACELL PERTUSSIS 5-2.5-18.5 LF-MCG/0.5 IM SUSY: 0.5000 mL | PREFILLED_SYRINGE | Freq: Once | INTRAMUSCULAR | Status: DC

## 2023-12-24 MED ORDER — FENTANYL CITRATE PF 50 MCG/ML IJ SOSY
12.5000 ug | PREFILLED_SYRINGE | INTRAMUSCULAR | Status: DC | PRN
  Administered 2023-12-25: 25 ug via INTRAVENOUS
  Filled 2023-12-24: qty 1

## 2023-12-24 MED ORDER — FUROSEMIDE 10 MG/ML IJ SOLN
20.0000 mg | Freq: Two times a day (BID) | INTRAMUSCULAR | Status: DC
  Administered 2023-12-24 – 2023-12-25 (×2): 20 mg via INTRAVENOUS
  Filled 2023-12-24 (×2): qty 2

## 2023-12-24 MED ORDER — SODIUM CHLORIDE 0.9% FLUSH
3.0000 mL | Freq: Two times a day (BID) | INTRAVENOUS | Status: DC
  Administered 2023-12-24 – 2023-12-26 (×5): 3 mL via INTRAVENOUS

## 2023-12-24 MED ORDER — IRBESARTAN 300 MG PO TABS
300.0000 mg | ORAL_TABLET | Freq: Every day | ORAL | Status: DC
  Filled 2023-12-24: qty 1

## 2023-12-24 MED ORDER — LIDOCAINE HCL (PF) 1 % IJ SOLN
30.0000 mL | Freq: Once | INTRAMUSCULAR | Status: AC
  Administered 2023-12-24: 30 mL
  Filled 2023-12-24: qty 30

## 2023-12-24 MED ORDER — ACETAMINOPHEN 500 MG PO TABS: 500.0000 mg | ORAL_TABLET | Freq: Four times a day (QID) | ORAL | Status: DC | PRN

## 2023-12-24 MED ORDER — SENNOSIDES-DOCUSATE SODIUM 8.6-50 MG PO TABS: 1.0000 | ORAL_TABLET | Freq: Every evening | ORAL | Status: DC | PRN

## 2023-12-24 MED ORDER — SPIRONOLACTONE 12.5 MG HALF TABLET
12.5000 mg | ORAL_TABLET | Freq: Every day | ORAL | Status: DC
  Administered 2023-12-24 – 2023-12-27 (×4): 12.5 mg via ORAL
  Filled 2023-12-24 (×4): qty 1

## 2023-12-24 MED ORDER — CARVEDILOL 12.5 MG PO TABS
12.5000 mg | ORAL_TABLET | Freq: Two times a day (BID) | ORAL | Status: DC
  Administered 2023-12-24 – 2023-12-27 (×6): 12.5 mg via ORAL
  Filled 2023-12-24 (×7): qty 1

## 2023-12-24 MED ORDER — TETANUS-DIPHTH-ACELL PERTUSSIS 5-2.5-18.5 LF-MCG/0.5 IM SUSY
0.5000 mL | PREFILLED_SYRINGE | Freq: Once | INTRAMUSCULAR | Status: AC
  Administered 2023-12-24: 0.5 mL via INTRAMUSCULAR
  Filled 2023-12-24: qty 0.5

## 2023-12-24 MED ORDER — FUROSEMIDE 10 MG/ML IJ SOLN
20.0000 mg | Freq: Once | INTRAMUSCULAR | Status: DC
  Filled 2023-12-24: qty 2

## 2023-12-24 MED ORDER — CEFAZOLIN SODIUM-DEXTROSE 1-4 GM/50ML-% IV SOLN
1.0000 g | Freq: Once | INTRAVENOUS | Status: AC
  Administered 2023-12-24: 1 g via INTRAVENOUS
  Filled 2023-12-24: qty 50

## 2023-12-24 MED ORDER — PROCHLORPERAZINE EDISYLATE 10 MG/2ML IJ SOLN: 5.0000 mg | Freq: Four times a day (QID) | INTRAMUSCULAR | Status: DC | PRN

## 2023-12-24 MED ORDER — TRAMADOL HCL 50 MG PO TABS: 50.0000 mg | ORAL_TABLET | Freq: Three times a day (TID) | ORAL | Status: DC | PRN

## 2023-12-24 NOTE — ED Notes (Signed)
 This RN called main lab to add on the trop to the labs that were drawn earlier on pt

## 2023-12-24 NOTE — ED Notes (Signed)
 Pt ambulated down hallway with pulse ox on. Using cane pt had a steady gait and lowest o2 reading was 96% on RA.

## 2023-12-24 NOTE — ED Provider Notes (Signed)
 [place] [facility]GENCY DEPARTMENT AT [facility] Provider Note   CSN: [id number] Arrival date & time: 12/23/23  2020     Patient presents with: Kathryn [name] is a 89 y.o. female.   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  States she tripped over a stool.  Sustained skin tear to her right leg.  Denies hitting head or losing consciousness.  Complains of pain to her left hip as well as right leg.  No loss of conscious.  No vomiting.  Complains of pain to her left lower leg.  No preceding dizziness or lightheadedness.  No chest pain or shortness of breath.  Complains of pain to left hip and right lower leg.  Sustained skin tear to right lower leg.  No focal weakness, numbness or tingling.  No bowel or bladder incontinence.  No neck or back pain.  No preceding dizziness or lightheadedness  The history is provided by the patient.  Fall Pertinent negatives include no chest pain, no abdominal pain and no shortness of breath.       Prior to Admission medications   Medication Sig Start Date End Date Taking? Authorizing Provider  acetaminophen  (TYLENOL ) 500 MG tablet Take 1 tablet (500 mg total) by mouth every 6 (six) hours as needed. 02/09/15   Rober Chimera, MD  Ascorbic Acid (VITAMIN C PO) Take 1 tablet by mouth daily.    [provider]  aspirin EC 81 MG tablet Take 81 mg by mouth every morning.     [provider]  Calcium 200 MG TABS Take 200 mg by mouth 2 (two) times daily.    [provider]  calcium carbonate (SUPER CALCIUM) 1500 (600 Ca) MG TABS tablet  06/02/12   [provider]  carvedilol  (COREG ) 12.5 MG tablet Take 1 tablet (12.5 mg total) by mouth 2 (two) times daily. 08/23/23 11/21/23  Debria Fang, PA-C  cholecalciferol (VITAMIN D) 1000 units tablet Take 1,000 Units by mouth daily.    [provider]  CRANBERRY PO Take 1 capsule  by mouth daily in the afternoon.    [provider]  erythromycin  ophthalmic ointment Place 1 Application into the left eye 3 (three) times daily. 07/12/22   Croitoru, Mihai, MD  EVENING PRIMROSE OIL PO Take 1 capsule by mouth daily in the afternoon.    [provider]  furosemide  (LASIX ) 40 MG tablet Take 0.5 tablets (20 mg total) by mouth as needed (for swelling). 09/06/23   Croitoru, Mihai, MD  mupirocin  ointment (BACTROBAN ) 2 %     [provider]  olmesartan (BENICAR) 40 MG tablet Take 40 mg by mouth daily.    [provider]  Omega 3 340 MG CPDR Take one tablet by mouth twice daily  but only occas. use in 2016 Oral 10/06/09   [provider]  Polyethyl Glycol-Propyl Glycol (SYSTANE OP) Place 1 drop into both eyes daily as needed (dry eyes).    [provider]  spironolactone  (ALDACTONE ) 25 MG tablet Take 0.5 tablets (12.5 mg total) by mouth daily. 09/23/23   Debria Fang, PA-C  TART CHERRY PO Take 3 capsules by mouth daily in the afternoon. Take 1-3 tablets daily    [provider]  vitamin B-12 (CYANOCOBALAMIN) 1000 MCG tablet Take 1,000 mcg by mouth daily.    [provider]  zinc gluconate 50 MG tablet Take 50 mg by mouth daily.  [provider]    Allergies: Acetaminophen -codeine, Atorvastatin, Codeine, Darvocet [propoxyphene n-acetaminophen ], Ibandronate, Iron, Ketamine , Levofloxacin, Percocet [oxycodone-acetaminophen ], Procaine, Proton pump inhibitors, Valsartan, and Epinephrine     Review of Systems  Constitutional:  Negative for activity change, appetite change and fever.  HENT:  Negative for congestion and postnasal drip.   Respiratory:  Negative for chest tightness and shortness of breath.   Cardiovascular:  Negative for chest pain.  Gastrointestinal:  Negative for abdominal pain, nausea and vomiting.  Genitourinary:  Negative for dysuria and menstrual problem.  Musculoskeletal:  Positive for  arthralgias and myalgias.  Skin:  Positive for wound.  Neurological:  Negative for facial asymmetry and weakness.    all other systems are negative except as noted in the HPI and PMH.   Updated Vital Signs BP (!) 187/81   Pulse 70   Temp 97.8 F (36.6 C)   Resp 18   Ht 5' 4 (1.626 m)   Wt 52.5 kg   SpO2 100%   BMI 19.87 kg/m   Physical Exam Vitals and nursing note reviewed.  Constitutional:      General: She is not in acute distress.    Appearance: She is well-developed. She is not ill-appearing.  HENT:     Head: Normocephalic and atraumatic.     Mouth/Throat:     Pharynx: No oropharyngeal exudate.   Eyes:     Conjunctiva/sclera: Conjunctivae normal.     Pupils: Pupils are equal, round, and reactive to light.   Neck:     Comments: No midline C or T spine pain Cardiovascular:     Rate and Rhythm: Normal rate and regular rhythm.     Heart sounds: Normal heart sounds. No murmur heard. Pulmonary:     Effort: Pulmonary effort is normal. No respiratory distress.     Breath sounds: Normal breath sounds.  Abdominal:     Palpations: Abdomen is soft.     Tenderness: There is no abdominal tenderness. There is no guarding or rebound.   Musculoskeletal:        General: Tenderness present. Normal range of motion.     Cervical back: Normal range of motion and neck supple.     Comments: Skin tear right shin as depicted.  Flexion and extension intact.  Intact DP and PT pulse.  Able to wiggle toes.  Full range of motion of right hip and knee.  Ecchymosis left buttock and posterior thigh Pain with range of motion of left hip.   Skin:    General: Skin is warm.   Neurological:     Mental Status: She is alert and oriented to person, place, and time.     Cranial Nerves: No cranial nerve deficit.     Motor: No abnormal muscle tone.     Coordination: Coordination normal.     Comments:  5/5 strength throughout. CN 2-12 intact.Equal grip strength.   Psychiatric:        Behavior:  Behavior normal.     (all labs ordered are listed, but only abnormal results are displayed) Labs Reviewed  CBC WITH DIFFERENTIAL/PLATELET - Abnormal; Notable for the following components:      Result Value   WBC 11.7 (*)    RBC 3.24 (*)    Hemoglobin 10.0 (*)    HCT 31.2 (*)    Neutro Abs 9.3 (*)    All other components within normal limits  BASIC METABOLIC PANEL WITH GFR - Abnormal; Notable for the following components:   Glucose, Bld 154 (*)  BUN 26 (*)    Creatinine, Ser 1.22 (*)    GFR, Estimated 41 (*)    All other components within normal limits  CBC WITH DIFFERENTIAL/PLATELET  BRAIN NATRIURETIC PEPTIDE  TROPONIN I (HIGH SENSITIVITY)  TROPONIN I (HIGH SENSITIVITY)    EKG: None  Radiology: CT Head Wo Contrast Result Date: 12/23/2023 CLINICAL DATA:  Fall with head trauma EXAM: CT HEAD WITHOUT CONTRAST TECHNIQUE: Contiguous axial images were obtained from the base of the skull through the vertex without intravenous contrast. RADIATION DOSE REDUCTION: This exam was performed according to the departmental dose-optimization program which includes automated exposure control, adjustment of the mA and/or kV according to patient size and/or use of iterative reconstruction technique. COMPARISON:  CT brain 07/11/2006 FINDINGS: Brain: No acute territorial infarction, hemorrhage or intracranial mass. Patchy white matter hypodensity. Mild atrophy. Prominent ventricles likely related to atrophy Vascular: No hyperdense vessels.  Carotid vascular calcification Skull: Normal. Negative for fracture or focal lesion. Sinuses/Orbits: No acute finding. Other: None IMPRESSION: 1. No CT evidence for acute intracranial abnormality. 2. Atrophy and chronic small vessel ischemic changes of the white matter. Electronically Signed   By: Esmeralda Hedge M.D.   On: 12/23/2023 21:59   DG Tibia/Fibula Right Result Date: 12/23/2023 CLINICAL DATA:  Marvell Slider EXAM: RIGHT TIBIA AND FIBULA - 2 VIEW COMPARISON:  None  Available. FINDINGS: Frontal and lateral views of the right tibia and fibula are obtained. There are no acute displaced fractures. Alignment of the right knee and ankle is anatomic. Moderate right knee osteoarthritis with chondrocalcinosis of the medial and lateral compartments. There is diffuse soft tissue swelling throughout the right lower extremity. No radiopaque foreign body. IMPRESSION: 1. Diffuse soft tissue swelling.  No acute fracture. 2. Moderate right knee osteoarthritis. Electronically Signed   By: Bobbye Burrow M.D.   On: 12/23/2023 21:26     .Laceration Repair  Date/Time: 12/24/2023 2:08 AM  Performed by: Earma Gloss, MD Authorized by: Earma Gloss, MD   Consent:    Consent obtained:  Verbal   Consent given by:  Patient   Risks, benefits, and alternatives were discussed: yes     Risks discussed:  Need for additional repair, infection, nerve damage, vascular damage, tendon damage, pain, retained foreign body, poor cosmetic result and poor wound healing Universal protocol:    Procedure explained and questions answered to patient or proxy's satisfaction: yes     Relevant documents present and verified: yes     Test results available: yes     Imaging studies available: yes     Required blood products, implants, devices, and special equipment available: yes     Site/side marked: yes     Immediately prior to procedure, a time out was called: yes     Patient identity confirmed:  Verbally with patient Anesthesia:    Anesthesia method:  Local infiltration   Local anesthetic:  Lidocaine  1% w/o epi Laceration details:    Location:  Leg   Leg location:  L lower leg   Length (cm):  9 Pre-procedure details:    Preparation:  Patient was prepped and draped in usual sterile fashion Exploration:    Limited defect created (wound extended): no     Imaging obtained: bedside ultrasound     Imaging outcome: foreign body not noted     Wound exploration: wound explored through full  range of motion and entire depth of wound visualized     Wound extent: areolar tissue violated     Wound extent: no  underlying fracture and no vascular damage     Contaminated: no   Treatment:    Area cleansed with:  Chlorhexidine    Amount of cleaning:  Standard   Irrigation solution:  Sterile saline   Irrigation volume:  2000   Irrigation method:  Pressure wash   Visualized foreign bodies/material removed: no     Debridement:  Minimal Skin repair:    Repair method:  Sutures and Steri-Strips   Suture size:  4-0   Wound skin closure material used: vicryl rapide.   Suture technique:  Simple interrupted   Number of sutures:  6 Approximation:    Approximation:  Loose Repair type:    Repair type:  Intermediate Post-procedure details:    Dressing:  Adhesive bandage   Procedure completion:  Tolerated well, no immediate complications    Medications Ordered in the ED  Tdap (BOOSTRIX ) injection 0.5 mL (0.5 mLs Intramuscular Given 12/24/23 0037)    Clinical Course as of 12/24/23 0039  Mon Dec 23, 2023  2339 I called Bridgette Campus, charge nurse.  There was some confusion.  I requested that this patient not be sent to waiting room.  Patient currently in waiting room.  Bridgette Campus will put patient in the bed as soon as possible. [AN]    Clinical Course User Index [AN] Deatra Face, MD                                 Medical Decision Making Amount and/or Complexity of Data Reviewed Labs: ordered. Decision-making details documented in ED Course. Radiology: ordered and independent interpretation performed. Decision-making details documented in ED Course. ECG/medicine tests: ordered and independent interpretation performed. Decision-making details documented in ED Course.  Risk Prescription drug management. Decision regarding hospitalization.   Mechanical fall.  Skin tear to right leg.  No head, neck, back, chest or abdominal pain.  No preceding dizziness or lightheadedness.  Does have  pacemaker in place.  Skin tear to right shin as depicted.  Intact DP and PT pulse.  Able to wiggle toes   X-ray negative for fracture or foreign body.  CT head and C-spine negative for acute traumatic injury.  Does show evidence of pleural effusions.  Does have a history of diastolic dysfunction takes Lasix  as needed but does not take it regularly.  Denies shortness of breath or chest pain.  She is able to ambulate without desaturation.  Lasix  ordered but she declines this stating she has some at home and does not want to be charged for it.  Not feel short of breath with walking.  BNP 500 without significant dyspnea.  Chest x-ray concerning for interstitial edema.  IV Lasix  ordered.  Given her difficulty breathing as well as significant pain to her left hip and leg and difficulty walking we will plan observation admission.  She does have left hip pain with attempted walking.  Has extensive ecchymosis to left buttock and hip area without fracture seen on CT.  Will get MRI to ensure no occult hip fracture. Not sure if her pacemaker is compatible however.   She is concerned about going home given her ongoing pain in her leg and difficulty walking and shortness of breath.  Will admit for CHF exacerbation, IV Lasix  ordered.  Recent echocardiogram in March showed normal ejection fraction but does have diastolic dysfunction.  Admission discussed with Dr. Brice Campi.    Final diagnoses:  None    ED Discharge Orders  None          Earma Gloss, MD 12/24/23 [phone]

## 2023-12-24 NOTE — ED Notes (Signed)
 This RN wrapped pt RLE with telfa and paper tape per Dr.Rancour order to this RN

## 2023-12-24 NOTE — ED Notes (Signed)
 Nt called CCMD @ 4:22am

## 2023-12-24 NOTE — ED Notes (Signed)
 This RN placed 3 new telfa pads on pt wound. Also placed abd pad on top of telfa, then wrapped leg with ace bandage to help stop small amount of bleeding.

## 2023-12-24 NOTE — ED Notes (Signed)
 Patient is now back in bed on the monitor with call bell in reach got patient some warm blankets

## 2023-12-24 NOTE — ED Notes (Signed)
 RN entered room to give patient her morning medications. Patient told RN that she had already taken her meds, that she had brought them with her. Upon examination the patient had only taken carvedilol  6.25mg  and one tablet. Patient was unaware that the ordered dose of medication was double what she currently had at home. Patient was also unaware of other medications that she was supposedly ordered and taking and was unsure which of her providers had ordered them. RN making pharmacy aware to perform medical reconciliation with extra care to ensure patient safety and minimize polypharmacy.

## 2023-12-24 NOTE — Consult Note (Signed)
 WOC Nurse Consult Note: Reason for Consult: large skin tear  Wound type: skin tear/laceration R lower leg full thickness sutured in ED with 6  sutures and steri-strips Pressure Injury POA: NA  Measurement: see nursing flowsheet  Wound bed: red moist, sutured  Drainage (amount, consistency, odor) serosanguinous  Periwound: ecchymosis  Dressing procedure/placement/frequency: Cleanse R lower leg wound with NS, apply Xeroform gauze ([place] [phone]) to wound bed every other day and secure with Kerlix roll gauze.    POC discussed with bedside nurse. WOC team will not follow.  Re-consult if further needs arise.   Thank you,    Ronni Colace MSN, RN-BC, [employer]

## 2023-12-24 NOTE — ED Notes (Addendum)
 Dr.Rancour at bedside to place sutures and steri strips on pt RLE. Pt has a large skin tear from mechanical fall and landing on an iron bar stool.

## 2023-12-24 NOTE — ED Notes (Signed)
Walked patient to the bathroom patient did well 

## 2023-12-24 NOTE — H&P (Signed)
 History and Physical    Kathryn [name] ZOX:[id number] DOB: 1927/07/28 DOA: 12/23/2023  PCP: Aldo Hun, MD   Patient coming from: Home   Chief Complaint: Fall, right leg laceration, left hip pain, SOB   HPI: Kathryn [name] is a 89 y.o. female with medical history significant for hypertension, CKD 3A, history of Takotsubo cardiomyopathy, atrial fibrillation not anticoagulated, chronic diastolic CHF, and heart block with pacemaker who presents with right leg laceration after a fall.  Patient reports that she has had some shortness of breath and fluid issues recently but has not been using her as-needed Lasix .  Despite the dyspnea, she has been able to go about her usual activities but struck her right shin against a metal stool yesterday, causing her to fall onto her left hip.  She does not believe that she hit her head and denies losing consciousness.  ED Course: Upon arrival to the ED, patient is found to be afebrile and saturating well on room air with normal RR, normal HR, and stable BP.  Labs are most notable for creatinine 1.22, normal troponin x 2, and BNP 500.  Trauma scans are negative for acute fractures or dislocation.  Chest x-ray notable for small bilateral pleural effusions.  Tdap was given, right leg laceration was sutured, and the patient was given 20 mg IV Lasix .  Review of Systems:  All other systems reviewed and apart from HPI, are negative.  Past Medical History:  Diagnosis Date   Arthritis    HANDS   Bradycardia    CKD stage 3a, GFR 45-59 ml/min (HCC) 12/24/2023   DVT (deep venous thrombosis) (HCC) 2010   Dysrhythmia    HX OF PAROXYSMAL ATRIAL FIB- TAKES ASPIRIN FOR BLOOD THINNER   GERD (gastroesophageal reflux disease)    Hypertension    Peripheral vascular disease (HCC)    Blood clot behind Right knee   Presence of permanent cardiac pacemaker    COMPLETE HEART BLOCK--DR. CROITORU     Past Surgical History:  Procedure Laterality Date    ABDOMINAL HYSTERECTOMY     APPENDECTOMY     AUGMENTATION MAMMAPLASTY Bilateral    BREAST ENHANCEMENT SURGERY     BREAST IMPLANT REMOVAL Bilateral 08/28/2017   Procedure: REMOVAL BREAST IMPLANTS BILATERAL;  Surgeon: Thornell Flirt, DO;  Location: [facility];  Service: Plastics;  Laterality: Bilateral;   CATARACT EXTRACTION, BILATERAL     CHOLECYSTECTOMY     COLONOSCOPY N/A 03/05/2014   Procedure: COLONOSCOPY;  Surgeon: Thayne Fine, MD;  Location: [facility];  Service: General;  Laterality: N/A;   COLOSTOMY CLOSURE N/A 05/21/2014   Procedure: LAPAROSCOPIC ASSISTED CLOSURE OF COLOSTOMY AND REPAIR OF TRANSVERSE COLON AND ENTEROTOMY;  Surgeon: Juanita Norlander, MD;  Location: [facility];  Service: General;  Laterality: N/A;   EP IMPLANTABLE DEVICE N/A 10/04/2015   Procedure: PPM/BIV PPM Generator Changeout;  Surgeon: Luana Rumple, MD;  Location: [facility];  Service: Cardiovascular;  Laterality: N/A;   LAPAROTOMY N/A 08/27/2013   Procedure: LAPAROSCOPY CONVERTED TO OPEN LAPAROTOMY WITH SIGMOID COLECTOMY AND COLOSTOMY;  Surgeon: Thayne Fine, MD;  Location: [facility];  Service: General;  Laterality: N/A;   OPEN REDUCTION INTERNAL FIXATION (ORIF) DISTAL RADIAL FRACTURE Left 02/15/2015   Procedure: OPEN TREATMENT OF LEFT DISTAL RADIUS FRACTURE;  Surgeon: Rober Chimera, MD;  Location: [facility];  Service: Orthopedics;  Laterality: Left;   PACEMAKER INSERTION  12/02/1998   medtronic   PERMANENT PACEMAKER GENERATOR CHANGE  02/28/2007  Medtronic Adapta   TONSILLECTOMY     US  ECHOCARDIOGRAPHY  10/09/2011   mild LAE, mild MR,mild to mod TR    Social History:   reports that she has never smoked. She has never used smokeless tobacco. She reports that she does not drink alcohol and does not use drugs.  Allergies  Allergen Reactions   Acetaminophen -Codeine Other (See Comments)   Atorvastatin     Other reaction(s): cramps in leg   Codeine     NOT SURE  WHAT THE REACTION WAS   Darvocet [Propoxyphene N-Acetaminophen ] Other (See Comments)    Hallucination    Ibandronate     Other reaction(s): could not swallow the pills and worried about the side effects   Iron     Other reaction(s): upset stomach   Ketamine      Other reaction(s): made her hallucinate she thinks   Levofloxacin     Other reaction(s): bothered her shoulder but she says she can take cipro   Percocet [Oxycodone-Acetaminophen ] Other (See Comments)    Hallucinations    Procaine Other (See Comments)   Proton Pump Inhibitors Diarrhea   Valsartan     Other reaction(s): strange dizziness and visual effects   Epinephrine  Palpitations and Other (See Comments)    Severe increase in heart rate (has pacemaker)    Family History  Problem Relation Age of Onset   Heart disease Mother    Heart disease Father    Breast cancer Neg Hx      Prior to Admission medications   Medication Sig Start Date End Date Taking? Authorizing Provider  acetaminophen  (TYLENOL ) 500 MG tablet Take 1 tablet (500 mg total) by mouth every 6 (six) hours as needed. 02/09/15   Rober Chimera, MD  Ascorbic Acid (VITAMIN C PO) Take 1 tablet by mouth daily.    [provider]  aspirin EC 81 MG tablet Take 81 mg by mouth every morning.     [provider]  Calcium 200 MG TABS Take 200 mg by mouth 2 (two) times daily.    [provider]  calcium carbonate (SUPER CALCIUM) 1500 (600 Ca) MG TABS tablet  06/02/12   [provider]  carvedilol  (COREG ) 12.5 MG tablet Take 1 tablet (12.5 mg total) by mouth 2 (two) times daily. 08/23/23 11/21/23  Debria Fang, PA-C  cholecalciferol (VITAMIN D) 1000 units tablet Take 1,000 Units by mouth daily.    [provider]  CRANBERRY PO Take 1 capsule by mouth daily in the afternoon.    [provider]  erythromycin  ophthalmic ointment Place 1 Application into the left eye 3 (three) times daily. 07/12/22   Croitoru, Mihai, MD   EVENING PRIMROSE OIL PO Take 1 capsule by mouth daily in the afternoon.    [provider]  furosemide  (LASIX ) 40 MG tablet Take 0.5 tablets (20 mg total) by mouth as needed (for swelling). 09/06/23   Croitoru, Mihai, MD  mupirocin  ointment (BACTROBAN ) 2 %     [provider]  olmesartan (BENICAR) 40 MG tablet Take 40 mg by mouth daily.    [provider]  Omega 3 340 MG CPDR Take one tablet by mouth twice daily  but only occas. use in 2016 Oral 10/06/09   [provider]  Polyethyl Glycol-Propyl Glycol (SYSTANE OP) Place 1 drop into both eyes daily as needed (dry eyes).    [provider]  spironolactone  (ALDACTONE ) 25 MG tablet Take 0.5 tablets (12.5 mg total) by mouth daily. 09/23/23  Liane Redman R, PA-C  TART CHERRY PO Take 3 capsules by mouth daily in the afternoon. Take 1-3 tablets daily    [provider]  vitamin B-12 (CYANOCOBALAMIN) 1000 MCG tablet Take 1,000 mcg by mouth daily.    [provider]  zinc gluconate 50 MG tablet Take 50 mg by mouth daily.    [provider]    Physical Exam: Vitals:   12/24/23 0215 12/24/23 0245 12/24/23 0330 12/24/23 0359  BP:      Pulse: 66 70 70   Resp: 16 16    Temp:    97.7 F (36.5 C)  TempSrc:      SpO2: 100% 100% 100%   Weight:      Height:        Constitutional: NAD, calm  Eyes: PERTLA, lids and conjunctivae normal ENMT: Mucous membranes are moist. Posterior pharynx clear of any exudate or lesions.   Neck: supple, no masses  Respiratory: Fine rales. No wheezing. Speaking full sentences.  Cardiovascular: S1 & S2 heard, regular rate and rhythm. Pretibial pitting edema.   Abdomen: No tenderness, soft. Bowel sounds active.  Musculoskeletal: no clubbing / cyanosis. Left hip tenderness and ecchymosis.   Skin: Large right shin laceration. Warm, dry, well-perfused. Neurologic: CN 2-12 grossly intact. Moving all extremities. Alert and oriented.  Psychiatric:  Pleasant. Cooperative.    Labs and Imaging on Admission: I have personally reviewed following labs and imaging studies  CBC: Recent Labs  Lab 12/23/23 2159  WBC 11.7*  NEUTROABS 9.3*  HGB 10.0*  HCT 31.2*  MCV 96.3  PLT 227   Basic Metabolic Panel: Recent Labs  Lab 12/23/23 2159  NA 136  K 4.1  CL 104  CO2 24  GLUCOSE 154*  BUN 26*  CREATININE 1.22*  CALCIUM 9.3   GFR: Estimated Creatinine Clearance: 23.9 mL/min (A) (by C-G formula based on SCr of 1.22 mg/dL (H)). Liver Function Tests: No results for input(s): AST, ALT, ALKPHOS, BILITOT, PROT, ALBUMIN in the last 168 hours. No results for input(s): LIPASE, AMYLASE in the last 168 hours. No results for input(s): AMMONIA in the last 168 hours. Coagulation Profile: No results for input(s): INR, PROTIME in the last 168 hours. Cardiac Enzymes: No results for input(s): CKTOTAL, CKMB, CKMBINDEX, TROPONINI in the last 168 hours. BNP (last 3 results) No results for input(s): PROBNP in the last 8760 hours. HbA1C: No results for input(s): HGBA1C in the last 72 hours. CBG: No results for input(s): GLUCAP in the last 168 hours. Lipid Profile: No results for input(s): CHOL, HDL, LDLCALC, TRIG, CHOLHDL, LDLDIRECT in the last 72 hours. Thyroid Function Tests: No results for input(s): TSH, T4TOTAL, FREET4, T3FREE, THYROIDAB in the last 72 hours. Anemia Panel: No results for input(s): VITAMINB12, FOLATE, FERRITIN, TIBC, IRON, RETICCTPCT in the last 72 hours. Urine analysis:    Component Value Date/Time   COLORURINE AMBER (A) 08/27/2013 1236   APPEARANCEUR CLEAR 08/27/2013 1236   LABSPEC 1.020 08/27/2013 1236   PHURINE 5.5 08/27/2013 1236   GLUCOSEU NEGATIVE 08/27/2013 1236   HGBUR SMALL (A) 08/27/2013 1236   BILIRUBINUR NEGATIVE 08/27/2013 1236   KETONESUR NEGATIVE 08/27/2013 1236   PROTEINUR 30 (A) 08/27/2013 1236   UROBILINOGEN 0.2 08/27/2013 1236    NITRITE NEGATIVE 08/27/2013 1236   LEUKOCYTESUR SMALL (A) 08/27/2013 1236   Sepsis Labs: @LABRCNTIP (procalcitonin:4,lacticidven:4) )No results found for this or any previous visit (from the past 240 hours).   Radiological Exams on Admission: DG Chest 2 View Result Date: 12/24/2023 EXAM:  2 VIEW(S) XRAY OF THE CHEST 12/24/2023 03:03:35 AM COMPARISON: 08/27/2013 CLINICAL HISTORY: Pleural effusion on CT. SOB. Upper chest: Right pleural effusion on CT neck post fall. FINDINGS: LUNGS AND PLEURA: Small bilateral pleural effusions, left greater than right. Chronic right infrahilar opacity, likely scarring. HEART AND MEDIASTINUM: No acute abnormality of the cardiac and mediastinal silhouettes. BONES AND SOFT TISSUES: No acute osseous abnormality. Right subclavian pacemaker. IMPRESSION: 1. Small bilateral pleural effusions, left greater than right. 2. Chronic right infrahilar opacity, likely scarring. Electronically signed by: Zadie Herter MD 12/24/2023 03:08 AM EDT RP Workstation: [id number]   CT Tibia Fibula Right Wo Contrast Result Date: 12/24/2023 EXAM: CT RIGHT LOWER EXTREMITY, WITHOUT IV CONTRAST 12/24/2023 01:54:43 AM TECHNIQUE: Axial images were acquired through the right lower extremity without IV contrast. Reformatted images were reviewed. Automated exposure control, iterative reconstruction, and/or weight based adjustment of the mA/kV was utilized to reduce the radiation dose to as low as reasonably achievable. COMPARISON: None available. CLINICAL HISTORY: Lower leg trauma. S from home for mechanical fall. No thinners, No LOC. Did not hit head. Large skin tear to R leg. VSS. FINDINGS: BONES AND JOINTS: No fracture or dislocation. Mild tricompartmental degenerative changes in the knee with chondrocalcinosis in the medial and lateral compartments. SOFT TISSUES: Soft tissue laceration along the anterolateral aspect of the mid/distal tibia. No radiopaque foreign body is seen. IMPRESSION: 1. Soft tissue  laceration along the anterolateral aspect of the mid/distal tibia without radiopaque foreign body. 2. No fracture or dislocation. Electronically signed by: Zadie Herter MD 12/24/2023 02:02 AM EDT RP Workstation: [id number]   CT PELVIS WO CONTRAST Result Date: 12/24/2023 EXAM: CT PELVIS, WITHOUT IV CONTRAST 12/24/2023 01:54:43 AM TECHNIQUE: Axial images were acquired through the pelvis without IV contrast. Reformatted images were reviewed. Automated exposure control, iterative reconstruction, and/or weight based adjustment of the mA/kV was utilized to reduce the radiation dose to as low as reasonably achievable. COMPARISON: None available. CLINICAL HISTORY: Hip trauma, fracture suspected, no prior imaging. S from home for mechanical fall. No thinners, No LOC. Did not hit head. Large skin tear to R leg. VSS. FINDINGS: BONES: Bony pelvis and bilateral proximal femurs are intact. No acute fracture or focal osseous lesion. JOINTS: Mild degenerative changes of the bilateral hips. No dislocation. SOFT TISSUES: Status post left hemicolectomy with suture line in the lower pelvis (image 56). 2 nonobstructing right lower pole renal calculi measuring 4 mm (image 5). Status post hysterectomy. Atherosclerotic calcification of the abdominal aorta and bilateral iliacs. IMPRESSION: 1. No traumatic injury to the pelvis. Electronically signed by: Zadie Herter MD 12/24/2023 02:00 AM EDT RP Workstation: [id number]   CT Cervical Spine Wo Contrast Result Date: 12/24/2023 CLINICAL DATA:  Neck trauma, intoxicated or obtunded (Age >= 16y). Fall EXAM: CT CERVICAL SPINE WITHOUT CONTRAST TECHNIQUE: Multidetector CT imaging of the cervical spine was performed without intravenous contrast. Multiplanar CT image reconstructions were also generated. RADIATION DOSE REDUCTION: This exam was performed according to the departmental dose-optimization program which includes automated exposure control, adjustment of the mA and/or kV according  to patient size and/or use of iterative reconstruction technique. COMPARISON:  None Available. FINDINGS: Alignment: Normal. Skull base and vertebrae: Multilevel moderate severe degenerative changes spine. Moderate severe osseous neural a stenosis of the right C5-C6 level. No acute fracture. No aggressive appearing focal osseous lesion or focal pathologic process. Soft tissues and spinal canal: No prevertebral fluid or swelling. No visible canal hematoma. Upper chest: Right pleural effusion. Biapical pleural/pulmonary scarring. Other: None. IMPRESSION: 1. No acute  displaced fracture or traumatic listhesis of the cervical spine. 2. Right pleural effusion. Recommend further evaluation with chest x-ray PA and lateral view. Electronically Signed   By: Morgane  Naveau M.D.   On: 12/24/2023 01:58   CT Head Wo Contrast Result Date: 12/23/2023 CLINICAL DATA:  Fall with head trauma EXAM: CT HEAD WITHOUT CONTRAST TECHNIQUE: Contiguous axial images were obtained from the base of the skull through the vertex without intravenous contrast. RADIATION DOSE REDUCTION: This exam was performed according to the departmental dose-optimization program which includes automated exposure control, adjustment of the mA and/or kV according to patient size and/or use of iterative reconstruction technique. COMPARISON:  CT brain 07/11/2006 FINDINGS: Brain: No acute territorial infarction, hemorrhage or intracranial mass. Patchy white matter hypodensity. Mild atrophy. Prominent ventricles likely related to atrophy Vascular: No hyperdense vessels.  Carotid vascular calcification Skull: Normal. Negative for fracture or focal lesion. Sinuses/Orbits: No acute finding. Other: None IMPRESSION: 1. No CT evidence for acute intracranial abnormality. 2. Atrophy and chronic small vessel ischemic changes of the white matter. Electronically Signed   By: Esmeralda Hedge M.D.   On: 12/23/2023 21:59   DG Tibia/Fibula Right Result Date: 12/23/2023 CLINICAL  DATA:  Marvell Slider EXAM: RIGHT TIBIA AND FIBULA - 2 VIEW COMPARISON:  None Available. FINDINGS: Frontal and lateral views of the right tibia and fibula are obtained. There are no acute displaced fractures. Alignment of the right knee and ankle is anatomic. Moderate right knee osteoarthritis with chondrocalcinosis of the medial and lateral compartments. There is diffuse soft tissue swelling throughout the right lower extremity. No radiopaque foreign body. IMPRESSION: 1. Diffuse soft tissue swelling.  No acute fracture. 2. Moderate right knee osteoarthritis. Electronically Signed   By: Bobbye Burrow M.D.   On: 12/23/2023 21:26    EKG: Independently reviewed. Paced.   Assessment/Plan   1. Acute on chronic diastolic CHF  - EF was 60-65% with grade III diastolic dysfunction on TTE from March 2025  - Continue diuresis with IV Lasix , continue Coreg , Aldactone , and ARB, monitor weight and I/Os, monitor renal function and electrolytes    2. Right leg laceration  - Tdap given and laceration sutured in ED  - She will need to be evaluated for suture removal in 10-14 days    3. Left hip pain  - No acute findings on CT  - ED physician has ordered MRI left hip, will follow-up on results    4. Atrial fibrillation  - Not anticoagulated d/t patient preference, continue Coreg    5. CKD 3A  - Appears close to baseline  - Renally-dose medications, monitor renal function while diuresing     DVT prophylaxis: SCD Code Status: Full  Level of Care: Level of care: Telemetry Cardiac Family Communication: None present   Disposition Plan:  Patient is from: home  Anticipated d/c is to: TBD Anticipated d/c date is: 12/25/23  Patient currently: Pending improved volume-status  Consults called: None  Admission status: Observation     Walton Guppy, MD [facility]  12/24/2023, 5:01 AM

## 2023-12-24 NOTE — Progress Notes (Signed)
 Pt seen and examined. Pt was admitted earlier this AM. No charge note  Briefly, patient is a 89 year old female with history notable for hypertension, CKD stage IIIa, history of Takotsubo cardiomyopathy, atrial fibrillation not on anticoagulation, chronic diastolic CHF, heart block with pacemaker for over 20 years who presented to the emergency department with fall and laceration of right leg.  Laceration was sutured by emergency staff.  During workup, patient was noted to be clinically volume overloaded and had admitted to not taking her diuretics as she should.  Presenting BNP was found to be elevated at over 500.  Patient was started on IV Lasix .  Of note, no hip fractures were noted on imaging  Assessment/Plan    1. Acute on chronic diastolic CHF  - EF was 60-65% with grade III diastolic dysfunction on TTE from March 2025  - Continue diuresis with IV Lasix , continue Coreg , Aldactone , and ARB - Follow strict I/o and daily wts -recheck bmet in AM   2. Right leg laceration  - Tdap given and laceration sutured in ED  - She will need to be evaluated for suture removal in 10-14 days     3. Left hip pain  - No acute findings on CT or plain film x-rays - Patient was observed ambulating with minimal difficulty -PT/OT consulted   4. Atrial fibrillation  - Not anticoagulated d/t patient preference, continue Coreg     5. CKD 3A  - Appears close to baseline  - Renally-dose medications, monitor renal function while diuresing  -Recheck basic metabolic panel in the morning  Cherylle Corwin, MD [facility]

## 2023-12-25 DIAGNOSIS — Z7982 Long term (current) use of aspirin: Secondary | ICD-10-CM | POA: Diagnosis not present

## 2023-12-25 DIAGNOSIS — W19XXXA Unspecified fall, initial encounter: Secondary | ICD-10-CM | POA: Diagnosis present

## 2023-12-25 DIAGNOSIS — Z8249 Family history of ischemic heart disease and other diseases of the circulatory system: Secondary | ICD-10-CM | POA: Diagnosis not present

## 2023-12-25 DIAGNOSIS — Y92000 Kitchen of unspecified non-institutional (private) residence as  the place of occurrence of the external cause: Secondary | ICD-10-CM | POA: Diagnosis not present

## 2023-12-25 DIAGNOSIS — Z79899 Other long term (current) drug therapy: Secondary | ICD-10-CM | POA: Diagnosis not present

## 2023-12-25 DIAGNOSIS — I480 Paroxysmal atrial fibrillation: Secondary | ICD-10-CM | POA: Diagnosis present

## 2023-12-25 DIAGNOSIS — K219 Gastro-esophageal reflux disease without esophagitis: Secondary | ICD-10-CM | POA: Diagnosis present

## 2023-12-25 DIAGNOSIS — Z950 Presence of cardiac pacemaker: Secondary | ICD-10-CM | POA: Diagnosis not present

## 2023-12-25 DIAGNOSIS — E611 Iron deficiency: Secondary | ICD-10-CM | POA: Diagnosis present

## 2023-12-25 DIAGNOSIS — I5033 Acute on chronic diastolic (congestive) heart failure: Secondary | ICD-10-CM | POA: Diagnosis present

## 2023-12-25 DIAGNOSIS — S7012XA Contusion of left thigh, initial encounter: Secondary | ICD-10-CM | POA: Diagnosis present

## 2023-12-25 DIAGNOSIS — I739 Peripheral vascular disease, unspecified: Secondary | ICD-10-CM | POA: Diagnosis present

## 2023-12-25 DIAGNOSIS — R58 Hemorrhage, not elsewhere classified: Secondary | ICD-10-CM | POA: Diagnosis not present

## 2023-12-25 DIAGNOSIS — D62 Acute posthemorrhagic anemia: Secondary | ICD-10-CM | POA: Diagnosis present

## 2023-12-25 DIAGNOSIS — M25552 Pain in left hip: Secondary | ICD-10-CM | POA: Diagnosis present

## 2023-12-25 DIAGNOSIS — S300XXA Contusion of lower back and pelvis, initial encounter: Secondary | ICD-10-CM | POA: Diagnosis present

## 2023-12-25 DIAGNOSIS — S81811A Laceration without foreign body, right lower leg, initial encounter: Secondary | ICD-10-CM | POA: Diagnosis present

## 2023-12-25 DIAGNOSIS — Z23 Encounter for immunization: Secondary | ICD-10-CM | POA: Diagnosis present

## 2023-12-25 DIAGNOSIS — Z90710 Acquired absence of both cervix and uterus: Secondary | ICD-10-CM | POA: Diagnosis not present

## 2023-12-25 DIAGNOSIS — I130 Hypertensive heart and chronic kidney disease with heart failure and stage 1 through stage 4 chronic kidney disease, or unspecified chronic kidney disease: Secondary | ICD-10-CM | POA: Diagnosis present

## 2023-12-25 DIAGNOSIS — Z888 Allergy status to other drugs, medicaments and biological substances status: Secondary | ICD-10-CM | POA: Diagnosis not present

## 2023-12-25 DIAGNOSIS — N1831 Chronic kidney disease, stage 3a: Secondary | ICD-10-CM | POA: Diagnosis present

## 2023-12-25 LAB — BASIC METABOLIC PANEL WITH GFR
Anion gap: 8 (ref 5–15)
BUN: 23 mg/dL (ref 8–23)
CO2: 24 mmol/L (ref 22–32)
Calcium: 9 mg/dL (ref 8.9–10.3)
Chloride: 105 mmol/L (ref 98–111)
Creatinine, Ser: 1.2 mg/dL — ABNORMAL HIGH (ref 0.44–1.00)
GFR, Estimated: 42 mL/min — ABNORMAL LOW (ref >60)
Glucose, Bld: 106 mg/dL — ABNORMAL HIGH (ref 70–99)
Potassium: 3.7 mmol/L (ref 3.5–5.1)
Sodium: 137 mmol/L (ref 135–145)

## 2023-12-25 LAB — CBC
HCT: 24 % — ABNORMAL LOW (ref 36.0–46.0)
Hemoglobin: 7.9 g/dL — ABNORMAL LOW (ref 12.0–15.0)
MCH: 30.3 pg (ref 26.0–34.0)
MCHC: 32.9 g/dL (ref 30.0–36.0)
MCV: 92 fL (ref 80.0–100.0)
Platelets: 174 10*3/uL (ref 150–400)
RBC: 2.61 MIL/uL — ABNORMAL LOW (ref 3.87–5.11)
RDW: 13.9 % (ref 11.5–15.5)
WBC: 7.3 10*3/uL (ref 4.0–10.5)
nRBC: 0 % (ref 0.0–0.2)

## 2023-12-25 LAB — MAGNESIUM: Magnesium: 1.5 mg/dL — ABNORMAL LOW (ref 1.7–2.4)

## 2023-12-25 MED ORDER — POTASSIUM CHLORIDE CRYS ER 20 MEQ PO TBCR
40.0000 meq | EXTENDED_RELEASE_TABLET | Freq: Once | ORAL | Status: AC
  Administered 2023-12-25: 40 meq via ORAL
  Filled 2023-12-25: qty 2

## 2023-12-25 MED ORDER — IRBESARTAN 75 MG PO TABS
75.0000 mg | ORAL_TABLET | Freq: Every day | ORAL | Status: DC
  Administered 2023-12-25: 75 mg via ORAL
  Filled 2023-12-25: qty 1

## 2023-12-25 MED ORDER — HYDROCODONE-ACETAMINOPHEN 5-325 MG PO TABS
1.0000 | ORAL_TABLET | Freq: Four times a day (QID) | ORAL | Status: DC | PRN
  Administered 2023-12-25 – 2023-12-27 (×2): 1 via ORAL
  Filled 2023-12-25 (×2): qty 1

## 2023-12-25 NOTE — Evaluation (Signed)
 Physical Therapy Evaluation Patient Details Name: Kathryn [name] MRN: [id number] DOB: 20-Oct-1927 Today's Date: 12/25/2023  History of Present Illness  Pt is a 89 y.o. female who presented to the ED 12/23/23 for fall, laceration of right leg, and CHF exacerbation. PMHx: HTN, CKD stage IIIa, Takotsubo cardiomyopathy, A-fib not on anticoagulation, chronic diastolic CHF, heart block with pacemaker for over 20 years.   Clinical Impression  Pt admitted with above diagnosis. PTA, pt was modI with functional mobility using a tripod cane, modI with ADLs/IADLs, and driving. She lives alone in a one story house with 4 STE and bilateral railings. Pt currently with functional limitations due to the deficits listed below (see PT Problem List). She required minA for bed mobility,  sit<>stand using RW, and lateral steps using RW. Her primarily limiting factor is pain followed by dizziness secondary to hypotension. Her SBP decreased by with prolonged static stance and pt c/o worsening dizziness, which improved upon sitting. Pt will benefit from acute skilled PT to increase her independence and safety with mobility to allow discharge. Given pt's limited family support and CLOF recommend post-acute rehab <3 hours/day of therapy.    12/25/23 1526  Vital Signs  Patient Position (if appropriate) Orthostatic Vitals  Orthostatic Lying   BP- Lying 120/49 (MAP 69)  Pulse- Lying 70  Orthostatic Sitting  BP- Sitting 126/50 (MAP 73)  Pulse- Sitting 71  Orthostatic Standing at 0 minutes  BP- Standing at 0 minutes 110/48 (MAP 67; pt c/o mild dizziness)  Pulse- Standing at 0 minutes 70  Orthostatic Standing at 3 minutes  BP- Standing at 3 minutes 91/52 (MAP 63; pt c/o moderate dizziness)  Pulse- Standing at 3 minutes 72           If plan is discharge home, recommend the following: A lot of help with walking and/or transfers;A lot of help with bathing/dressing/bathroom;Assistance with  cooking/housework;Assist for transportation;Help with stairs or ramp for entrance   Can travel by private vehicle   No    Equipment Recommendations None recommended by PT (Pt already has DME)  Recommendations for Other Services       Functional Status Assessment Patient has had a recent decline in their functional status and demonstrates the ability to make significant improvements in function in a reasonable and predictable amount of time.     Precautions / Restrictions Precautions Precautions: Fall Recall of Precautions/Restrictions: Intact Precaution/Restrictions Comments: Watch BP Restrictions Weight Bearing Restrictions Per Provider Order: No      Mobility  Bed Mobility Overal bed mobility: Needs Assistance Bed Mobility: Supine to Sit, Sit to Supine     Supine to sit: Min assist Sit to supine: Min assist   General bed mobility comments: Pt sat up on R side of bed with increased time. HOB flat and no use of bedrail. Assist to bring BLE off EOB and to elevate trunk. Pt scooted to EOB with aid of bed pad. Returning to bed pt controlled trunk and assist to bring BLE into bed. Repositioned herself in the center.    Transfers Overall transfer level: Needs assistance Equipment used: Rolling walker (2 wheels) Transfers: Sit to/from Stand Sit to Stand: Min assist           General transfer comment: Pt stood from lowest bed height. Cues for proper hand placement. She required minA to power up into standing and to stabilize when upright. Good eccentric control with sitting.    Ambulation/Gait Ambulation/Gait assistance: Min assist Gait Distance (Feet): 2 Feet  Assistive device: Rolling walker (2 wheels)         General Gait Details: Pt took side steps to the right along the EOB to get higher up in bed. Assist for trunk stability and manuevering RW.  Stairs            Wheelchair Mobility     Tilt Bed    Modified Rankin (Stroke Patients Only)        Balance Overall balance assessment: Needs assistance Sitting-balance support: No upper extremity supported, Feet supported Sitting balance-Leahy Scale: Fair Sitting balance - Comments: Pt sat EOB with supervision. She ate applesauce and drank water.   Standing balance support: Bilateral upper extremity supported, During functional activity, Reliant on assistive device for balance Standing balance-Leahy Scale: Poor Standing balance comment: Pt dependent on RW and minA. She maintained static stance for ~26mins.                             Pertinent Vitals/Pain Pain Assessment Pain Assessment: Faces Faces Pain Scale: Hurts even more Pain Location: RLE and L hip Pain Descriptors / Indicators: Discomfort, Grimacing, Guarding, Moaning Pain Intervention(s): Limited activity within patient's tolerance, Monitored during session, Repositioned    Home Living Family/patient expects to be discharged to:: Private residence Living Arrangements: Alone Available Help at Discharge: Family;Available PRN/intermittently (Pt's children live nearby but work and have families.) Type of Home: House Home Access: Stairs to enter Entrance Stairs-Rails: Right;Left;Can reach both Secretary/administrator of Steps: 4   Home Layout: One level Home Equipment: Cane - single point;Rollator (4 wheels);Shower seat      Prior Function Prior Level of Function : Independent/Modified Independent;Driving;History of Falls (last six months)             Mobility Comments: ModI using tripod cane. Reports 2 falls in last 6 months. ADLs Comments: ModI with ADLs. Manages her own medication. Takes care of cooking/cleaning. Drives     Extremity/Trunk Assessment   Upper Extremity Assessment Upper Extremity Assessment: Defer to OT evaluation    Lower Extremity Assessment Lower Extremity Assessment: Generalized weakness;RLE deficits/detail;LLE deficits/detail RLE: Unable to fully assess due to pain RLE  Sensation: WNL LLE: Unable to fully assess due to pain LLE Sensation: WNL    Cervical / Trunk Assessment Cervical / Trunk Assessment: Kyphotic  Communication   Communication Communication: No apparent difficulties    Cognition Arousal: Alert Behavior During Therapy: WFL for tasks assessed/performed   PT - Cognitive impairments: No apparent impairments                       PT - Cognition Comments: Pt A,Ox4 Following commands: Intact       Cueing Cueing Techniques: Verbal cues, Tactile cues     General Comments General comments (skin integrity, edema, etc.): Pt with RLE laceration bandaged. Ecchymosis prevalent across posterior and lateral L hip/thigh. Pt with small bruises on RUE. She was very sensitive/tender to touch. Orthostatic Vitals taken. Pt c/o dizziness in standing, which improved upon sitting.    Exercises     Assessment/Plan    PT Assessment Patient needs continued PT services  PT Problem List Decreased strength;Decreased activity tolerance;Decreased balance;Decreased mobility;Decreased safety awareness;Pain       PT Treatment Interventions DME instruction;Gait training;Stair training;Functional mobility training;Therapeutic activities;Therapeutic exercise;Balance training;Neuromuscular re-education;Patient/family education    PT Goals (Current goals can be found in the Care Plan section)  Acute Rehab PT Goals Patient Stated Goal: Return  Home PT Goal Formulation: With patient Time For Goal Achievement: 01/08/24 Potential to Achieve Goals: Good    Frequency Min 2X/week     Co-evaluation               AM-PAC PT 6 Clicks Mobility  Outcome Measure Help needed turning from your back to your side while in a flat bed without using bedrails?: A Little Help needed moving from lying on your back to sitting on the side of a flat bed without using bedrails?: A Little Help needed moving to and from a bed to a chair (including a wheelchair)?: A  Little Help needed standing up from a chair using your arms (e.g., wheelchair or bedside chair)?: A Little Help needed to walk in hospital room?: Total Help needed climbing 3-5 steps with a railing? : Total 6 Click Score: 14    End of Session Equipment Utilized During Treatment: Gait belt Activity Tolerance: Patient tolerated treatment well;Patient limited by pain;Treatment limited secondary to medical complications (Comment) (hypotension and pt c/o dizziness.) Patient left: in bed;with call bell/phone within reach;with bed alarm set Nurse Communication: Mobility status;Other (comment) (Pt reporting she doesn't feel like she can take her pills and requesting for them to be crushed. BP response to activity) PT Visit Diagnosis: Muscle weakness (generalized) (M62.81);Difficulty in walking, not elsewhere classified (R26.2);Unsteadiness on feet (R26.81);History of falling (Z91.81);Pain Pain - Right/Left: Right Pain - part of body: Leg    Time: 1610-9604 PT Time Calculation (min) (ACUTE ONLY): 25 min   Charges:   PT Evaluation $PT Eval Moderate Complexity: 1 Mod   PT General Charges $$ ACUTE PT VISIT: 1 Visit         Glenford Lanes, PT, DPT Acute Rehabilitation Services Office: [phone] Secure Chat Preferred  Riva Chester 12/25/2023, 4:11 PM

## 2023-12-25 NOTE — Progress Notes (Addendum)
   12/25/23 1100  Orthostatic Lying   BP- Lying 114/64  Pulse- Lying 70  Orthostatic Sitting  BP- Sitting (!) 71/59  Pulse- Sitting 71   While in standing during OT eval, pt became symptomatic and required seated break. After returning to sit, BP recorded. Pt returned to supine BP recorded, pt reported resolve of symptoms. See full OT note for further details.  Shaunika Italiano C, OT  Acute Rehabilitation Services Office [phone] Secure chat preferred

## 2023-12-25 NOTE — Evaluation (Signed)
 Occupational Therapy Evaluation Patient Details Name: Kathryn [name] MRN: [id number] DOB: [birth year] Today's Date: 12/25/2023   History of Present Illness   Pt is a 89 y.o. female admitted 6/16 via EMS for fall in home. Skin tear to RLE, pain in L hip. Imaging negative for fx or acute trauma. PMH: pacemaker, PVD, HTN, DVT, Takotsubo cardiomyopathy, atrial fibrillation not anticoagulated, chronic diastolic CHF     Clinical Impressions Pt admitted based on above, and was seen based on problem list below. PTA pt was living alone and was independent with ADLs and IADLs. Today pt is requiring set up  to min  assist for ADLs. Bed mobility and functional transfers are  min assist. During session, pt became orthostatic and symptomatic, unable to resolve with >5 seated rest break. Pt lives home alone and at this point in time pt requires 24/7 supervision for safety. Recommendation of <3 hours of skilled rehab daily. If pt arrange 24/7 supervision, then pt can d/c home with HHOT. OT will continue to follow acutely to maximize functional independence.        If plan is discharge home, recommend the following:   A little help with walking and/or transfers;A little help with bathing/dressing/bathroom     Functional Status Assessment   Patient has had a recent decline in their functional status and demonstrates the ability to make significant improvements in function in a reasonable and predictable amount of time.     Equipment Recommendations   BSC/3in1      Precautions/Restrictions   Precautions Precautions: Fall Recall of Precautions/Restrictions: Intact Precaution/Restrictions Comments: Watch BP Restrictions Weight Bearing Restrictions Per Provider Order: No     Mobility Bed Mobility Overal bed mobility: Needs Assistance Bed Mobility: Supine to Sit, Sit to Supine     Supine to sit: Min assist, HOB elevated, Used rails Sit to supine: Min assist, HOB elevated, Used  rails   General bed mobility comments: Light HH assist for trunk supine to sit; assist for LEs to return to supine    Transfers Overall transfer level: Needs assistance Equipment used: Rolling walker (2 wheels) Transfers: Sit to/from Stand, Bed to chair/wheelchair/BSC Sit to Stand: Min assist     Step pivot transfers: Contact guard assist     General transfer comment: x3 trials and use of momentum and min assist to come to stand, once in standing CGA for short step pivot transfer <>bsc      Balance Overall balance assessment: Needs assistance Sitting-balance support: No upper extremity supported, Feet supported Sitting balance-Leahy Scale: Fair     Standing balance support: Bilateral upper extremity supported, During functional activity, Reliant on assistive device for balance Standing balance-Leahy Scale: Poor Standing balance comment: Reliant on RW       ADL either performed or assessed with clinical judgement   ADL Overall ADL's : Needs assistance/impaired Eating/Feeding: Set up;Sitting   Grooming: Wash/dry hands;Sitting;Set up Grooming Details (indicate cue type and reason): Pt orthostatic in standing requiring seated break Upper Body Bathing: Set up;Sitting   Lower Body Bathing: Minimal assistance;Sit to/from stand Lower Body Bathing Details (indicate cue type and reason): Assist to reach BLEs Upper Body Dressing : Set up;Sitting   Lower Body Dressing: Minimal assistance;Sit to/from stand Lower Body Dressing Details (indicate cue type and reason): Assist to thread BLEs Toilet Transfer: Contact guard assist;Stand-pivot;Rolling walker (2 wheels);BSC/3in1 Toilet Transfer Details (indicate cue type and reason): CGA for balance Toileting- Clothing Manipulation and Hygiene: Contact guard assist;Sit to/from stand Toileting - Architect Details (  indicate cue type and reason): CGA for balance     Functional mobility during ADLs: Contact guard assist;Rolling  walker (2 wheels) General ADL Comments: Pt became symptomatic, leading to balance deficits     Vision Baseline Vision/History: 0 No visual deficits Vision Assessment?: No apparent visual deficits            Pertinent Vitals/Pain Pain Assessment Pain Assessment: Faces Faces Pain Scale: Hurts even more Pain Location: RLE Pain Descriptors / Indicators: Discomfort, Grimacing, Guarding Pain Intervention(s): Limited activity within patient's tolerance     Extremity/Trunk Assessment Upper Extremity Assessment Upper Extremity Assessment: Generalized weakness   Lower Extremity Assessment Lower Extremity Assessment: Defer to PT evaluation   Cervical / Trunk Assessment Cervical / Trunk Assessment: Kyphotic   Communication Communication Communication: No apparent difficulties   Cognition Arousal: Alert Behavior During Therapy: WFL for tasks assessed/performed Cognition: No apparent impairments     Following commands: Intact       Cueing  General Comments   Cueing Techniques: Verbal cues;Tactile cues  When standing from [facility] pt became orthostatic and symptomatic bp 71/59. Once returned to supine improved to 114/64, RN and MD notified           Home Living Family/patient expects to be discharged to:: Private residence Living Arrangements: Alone Available Help at Discharge: Family;Available PRN/intermittently Type of Home: House Home Access: Stairs to enter [employer] of Steps: 4 Entrance Stairs-Rails: Can reach both Home Layout: One level     Bathroom Shower/Tub: Producer, television/film/video: Handicapped height Bathroom Accessibility: Yes How Accessible: Accessible via walker Home Equipment: Other (comment);Rollator (4 wheels);Shower seat (Tripod cane)   Additional Comments: Pt's children live nearby but work      Prior Functioning/Environment Prior Level of Function : Independent/Modified Independent;Driving;History of Falls (last six  months)     Mobility Comments: Use of cane, 2 falls in last 6 months      OT Problem List: Decreased strength;Decreased activity tolerance;Impaired balance (sitting and/or standing);Decreased safety awareness;Decreased knowledge of use of DME or AE;Cardiopulmonary status limiting activity   OT Treatment/Interventions: Therapeutic exercise;Self-care/ADL training;Energy conservation;DME and/or AE instruction;Therapeutic activities;Balance training;Patient/family education      OT Goals(Current goals can be found in the care plan section)   Acute Rehab OT Goals Patient Stated Goal: To go home OT Goal Formulation: With patient Time For Goal Achievement: 01/08/24 Potential to Achieve Goals: Good   OT Frequency:  Min 2X/week       AM-PAC OT 6 Clicks Daily Activity     Outcome Measure Help from another person eating meals?: None Help from another person taking care of personal grooming?: A Little Help from another person toileting, which includes using toliet, bedpan, or urinal?: A Little Help from another person bathing (including washing, rinsing, drying)?: A Little Help from another person to put on and taking off regular upper body clothing?: A Little Help from another person to put on and taking off regular lower body clothing?: A Little 6 Click Score: 19   End of Session Equipment Utilized During Treatment: Gait belt;Rolling walker (2 wheels) Nurse Communication: Mobility status  Activity Tolerance: Patient limited by fatigue (orthostatic) Patient left: in bed;with call bell/phone within reach;with bed alarm set  OT Visit Diagnosis: Unsteadiness on feet (R26.81);Other abnormalities of gait and mobility (R26.89);Muscle weakness (generalized) (M62.81)                Time: 2130-8657 OT Time Calculation (min): 46 min Charges:  OT General Charges $OT Visit:  1 Visit OT Evaluation $OT Eval Moderate Complexity: 1 Mod OT Treatments $Self Care/Home Management : 23-37  mins  Delmer Ferraris, OT  Acute Rehabilitation Services Office [phone] Secure chat preferred   Mickael Alamo 12/25/2023, 12:51 PM

## 2023-12-25 NOTE — Progress Notes (Addendum)
 PROGRESS NOTE    Kathryn [name]  XBM:[id number] DOB: Sep 18, 1927 DOA: 12/23/2023 PCP: Aldo Hun, MD    89/F with history of Takotsubo cardiomyopathy, CKD 3A, paroxysmal A-fib not on anticoagulation, diastolic CHF, heart block with PPM presented to the ED with large right leg rest laceration after a fall.  In addition reported having some issues with shortness of breath and fluid but not consistently using her as needed Lasix .  On 6/16 she struck her right leg/shin against a metal stool resulting in a fall, denies loss of consciousness. - In the ED she was noted to have a large laceration, trauma scans were negative, also noted to be volume overloaded, BNP 500, chest x-ray with small pleural effusions Tdap was given, right leg laceration was sutured, and the patient was given 20 mg IV Lasix .   Subjective: -Complains of right leg pain, breathing is improving  Assessment and Plan:  Acute on chronic diastolic CHF  - EF was 60-65% with grade III diastolic dysfunction on TTE from March 2025  - Improving with diuresis, 1.4 L negative, continue low-dose IV Lasix  1 more day  - Continue Coreg  Aldactone  and ARB  - BMP in a.m., - increase activity, PT OT eval  -addendum: SBP dropped to 70, upon standing with PT, then recovered, will hold further IV Lasix  and ARB today    Right leg laceration  - Tdap given and laceration sutured in ED  - She will need to be evaluated for suture removal in 10-14 days    Acute on chronic anemia -Patient reports significant blood loss at the time of injury, hemoglobin down to 7.9, likely equilibrating, check anemia panel with a.m. labs    Left hip pain  - No acute findings on CT or plain film x-rays - Patient was observed ambulating with minimal difficulty -PT/OT consulted   P Atrial fibrillation  - Not anticoagulated d/t patient preference, continue Coreg     CKD 3A  - Appears close to baseline  - Monitor   DVT prophylaxis: SCDs Code Status: Full  code Family Communication: None present Disposition Plan: Home tomorrow if stable  Consultants:    Procedures:   Antimicrobials:    Objective: Vitals:   12/24/23 2353 12/25/23 0414 12/25/23 0721 12/25/23 1059  BP: (!) 159/67 139/65 (!) 149/69 (!) 153/68  Pulse: 69 70 70 83  Resp: 18 17 16 16   Temp: 98.7 F (37.1 C) 98.5 F (36.9 C) 98.4 F (36.9 C) 98 F (36.7 C)  TempSrc: Oral Oral Oral Oral  SpO2: 99% 98% 97% 95%  Weight:  51.6 kg    Height:        Intake/Output Summary (Last 24 hours) at 12/25/2023 1150 Last data filed at 12/24/2023 2359 Gross per 24 hour  Intake 3 ml  Output 1450 ml  Net -1447 ml   Filed Weights   12/23/23 2027 12/24/23 1710 12/25/23 0414  Weight: 52.5 kg 53 kg 51.6 kg    Examination:  General exam: Appears calm and comfortable, AAO x 3 Respiratory system: Decreased breath sounds at the bases Cardiovascular system: S1 & S2 heard, RRR.  Abd: nondistended, soft and nontender.Normal bowel sounds heard. Central nervous system: Alert and oriented. No focal neurological deficits. Extremities: 1 plus edema, right leg with Ace wrap, dressing Skin: As above Psychiatry:  Mood & affect appropriate.     Data Reviewed:   CBC: Recent Labs  Lab 12/23/23 2159 12/25/23 0246  WBC 11.7* 7.3  NEUTROABS 9.3*  --   HGB  10.0* 7.9*  HCT 31.2* 24.0*  MCV 96.3 92.0  PLT 227 174   Basic Metabolic Panel: Recent Labs  Lab 12/23/23 2159 12/25/23 0246  NA 136 137  K 4.1 3.7  CL 104 105  CO2 24 24  GLUCOSE 154* 106*  BUN 26* 23  CREATININE 1.22* 1.20*  CALCIUM 9.3 9.0  MG  --  1.5*   GFR: Estimated Creatinine Clearance: 23.9 mL/min (A) (by C-G formula based on SCr of 1.2 mg/dL (H)). Liver Function Tests: No results for input(s): AST, ALT, ALKPHOS, BILITOT, PROT, ALBUMIN in the last 168 hours. No results for input(s): LIPASE, AMYLASE in the last 168 hours. No results for input(s): AMMONIA in the last 168 hours. Coagulation  Profile: No results for input(s): INR, PROTIME in the last 168 hours. Cardiac Enzymes: No results for input(s): CKTOTAL, CKMB, CKMBINDEX, TROPONINI in the last 168 hours. BNP (last 3 results) No results for input(s): PROBNP in the last 8760 hours. HbA1C: No results for input(s): HGBA1C in the last 72 hours. CBG: No results for input(s): GLUCAP in the last 168 hours. Lipid Profile: No results for input(s): CHOL, HDL, LDLCALC, TRIG, CHOLHDL, LDLDIRECT in the last 72 hours. Thyroid Function Tests: No results for input(s): TSH, T4TOTAL, FREET4, T3FREE, THYROIDAB in the last 72 hours. Anemia Panel: No results for input(s): VITAMINB12, FOLATE, FERRITIN, TIBC, IRON, RETICCTPCT in the last 72 hours. Urine analysis:    Component Value Date/Time   COLORURINE AMBER (A) 08/27/2013 1236   APPEARANCEUR CLEAR 08/27/2013 1236   LABSPEC 1.020 08/27/2013 1236   PHURINE 5.5 08/27/2013 1236   GLUCOSEU NEGATIVE 08/27/2013 1236   HGBUR SMALL (A) 08/27/2013 1236   BILIRUBINUR NEGATIVE 08/27/2013 1236   KETONESUR NEGATIVE 08/27/2013 1236   PROTEINUR 30 (A) 08/27/2013 1236   UROBILINOGEN 0.2 08/27/2013 1236   NITRITE NEGATIVE 08/27/2013 1236   LEUKOCYTESUR SMALL (A) 08/27/2013 1236   Sepsis Labs: @LABRCNTIP (procalcitonin:4,lacticidven:4)  )No results found for this or any previous visit (from the past 240 hours).   Radiology Studies: DG Chest 2 View Result Date: 12/24/2023 EXAM: 2 VIEW(S) XRAY OF THE CHEST 12/24/2023 03:03:35 AM COMPARISON: 08/27/2013 CLINICAL HISTORY: Pleural effusion on CT. SOB. Upper chest: Right pleural effusion on CT neck post fall. FINDINGS: LUNGS AND PLEURA: Small bilateral pleural effusions, left greater than right. Chronic right infrahilar opacity, likely scarring. HEART AND MEDIASTINUM: No acute abnormality of the cardiac and mediastinal silhouettes. BONES AND SOFT TISSUES: No acute osseous abnormality. Right subclavian  pacemaker. IMPRESSION: 1. Small bilateral pleural effusions, left greater than right. 2. Chronic right infrahilar opacity, likely scarring. Electronically signed by: Zadie Herter MD 12/24/2023 03:08 AM EDT RP Workstation: [id number]   CT Tibia Fibula Right Wo Contrast Result Date: 12/24/2023 EXAM: CT RIGHT LOWER EXTREMITY, WITHOUT IV CONTRAST 12/24/2023 01:54:43 AM TECHNIQUE: Axial images were acquired through the right lower extremity without IV contrast. Reformatted images were reviewed. Automated exposure control, iterative reconstruction, and/or weight based adjustment of the mA/kV was utilized to reduce the radiation dose to as low as reasonably achievable. COMPARISON: None available. CLINICAL HISTORY: Lower leg trauma. S from home for mechanical fall. No thinners, No LOC. Did not hit head. Large skin tear to R leg. VSS. FINDINGS: BONES AND JOINTS: No fracture or dislocation. Mild tricompartmental degenerative changes in the knee with chondrocalcinosis in the medial and lateral compartments. SOFT TISSUES: Soft tissue laceration along the anterolateral aspect of the mid/distal tibia. No radiopaque foreign body is seen. IMPRESSION: 1. Soft tissue laceration along the anterolateral aspect of  the mid/distal tibia without radiopaque foreign body. 2. No fracture or dislocation. Electronically signed by: Zadie Herter MD 12/24/2023 02:02 AM EDT RP Workstation: [id number]   CT PELVIS WO CONTRAST Result Date: 12/24/2023 EXAM: CT PELVIS, WITHOUT IV CONTRAST 12/24/2023 01:54:43 AM TECHNIQUE: Axial images were acquired through the pelvis without IV contrast. Reformatted images were reviewed. Automated exposure control, iterative reconstruction, and/or weight based adjustment of the mA/kV was utilized to reduce the radiation dose to as low as reasonably achievable. COMPARISON: None available. CLINICAL HISTORY: Hip trauma, fracture suspected, no prior imaging. S from home for mechanical fall. No thinners, No  LOC. Did not hit head. Large skin tear to R leg. VSS. FINDINGS: BONES: Bony pelvis and bilateral proximal femurs are intact. No acute fracture or focal osseous lesion. JOINTS: Mild degenerative changes of the bilateral hips. No dislocation. SOFT TISSUES: Status post left hemicolectomy with suture line in the lower pelvis (image 56). 2 nonobstructing right lower pole renal calculi measuring 4 mm (image 5). Status post hysterectomy. Atherosclerotic calcification of the abdominal aorta and bilateral iliacs. IMPRESSION: 1. No traumatic injury to the pelvis. Electronically signed by: Zadie Herter MD 12/24/2023 02:00 AM EDT RP Workstation: [id number]   CT Cervical Spine Wo Contrast Result Date: 12/24/2023 CLINICAL DATA:  Neck trauma, intoxicated or obtunded (Age >= 16y). Fall EXAM: CT CERVICAL SPINE WITHOUT CONTRAST TECHNIQUE: Multidetector CT imaging of the cervical spine was performed without intravenous contrast. Multiplanar CT image reconstructions were also generated. RADIATION DOSE REDUCTION: This exam was performed according to the departmental dose-optimization program which includes automated exposure control, adjustment of the mA and/or kV according to patient size and/or use of iterative reconstruction technique. COMPARISON:  None Available. FINDINGS: Alignment: Normal. Skull base and vertebrae: Multilevel moderate severe degenerative changes spine. Moderate severe osseous neural a stenosis of the right C5-C6 level. No acute fracture. No aggressive appearing focal osseous lesion or focal pathologic process. Soft tissues and spinal canal: No prevertebral fluid or swelling. No visible canal hematoma. Upper chest: Right pleural effusion. Biapical pleural/pulmonary scarring. Other: None. IMPRESSION: 1. No acute displaced fracture or traumatic listhesis of the cervical spine. 2. Right pleural effusion. Recommend further evaluation with chest x-ray PA and lateral view. Electronically Signed   By: Morgane   Naveau M.D.   On: 12/24/2023 01:58   CT Head Wo Contrast Result Date: 12/23/2023 CLINICAL DATA:  Fall with head trauma EXAM: CT HEAD WITHOUT CONTRAST TECHNIQUE: Contiguous axial images were obtained from the base of the skull through the vertex without intravenous contrast. RADIATION DOSE REDUCTION: This exam was performed according to the departmental dose-optimization program which includes automated exposure control, adjustment of the mA and/or kV according to patient size and/or use of iterative reconstruction technique. COMPARISON:  CT brain 07/11/2006 FINDINGS: Brain: No acute territorial infarction, hemorrhage or intracranial mass. Patchy white matter hypodensity. Mild atrophy. Prominent ventricles likely related to atrophy Vascular: No hyperdense vessels.  Carotid vascular calcification Skull: Normal. Negative for fracture or focal lesion. Sinuses/Orbits: No acute finding. Other: None IMPRESSION: 1. No CT evidence for acute intracranial abnormality. 2. Atrophy and chronic small vessel ischemic changes of the white matter. Electronically Signed   By: Esmeralda Hedge M.D.   On: 12/23/2023 21:59   DG Tibia/Fibula Right Result Date: 12/23/2023 CLINICAL DATA:  Marvell Slider EXAM: RIGHT TIBIA AND FIBULA - 2 VIEW COMPARISON:  None Available. FINDINGS: Frontal and lateral views of the right tibia and fibula are obtained. There are no acute displaced fractures. Alignment of the right knee and ankle  is anatomic. Moderate right knee osteoarthritis with chondrocalcinosis of the medial and lateral compartments. There is diffuse soft tissue swelling throughout the right lower extremity. No radiopaque foreign body. IMPRESSION: 1. Diffuse soft tissue swelling.  No acute fracture. 2. Moderate right knee osteoarthritis. Electronically Signed   By: Bobbye Burrow M.D.   On: 12/23/2023 21:26     Scheduled Meds:  carvedilol   12.5 mg Oral BID   furosemide   20 mg Intravenous Once   furosemide   20 mg Intravenous BID   irbesartan    75 mg Oral Daily   sodium chloride  flush  3 mL Intravenous Q12H   spironolactone   12.5 mg Oral Daily   Tdap  0.5 mL Intramuscular Once   Continuous Infusions:   LOS: 0 days    Time spent:    Deforest Fast, MD [facility]   12/25/2023, 11:50 AM

## 2023-12-26 ENCOUNTER — Telehealth (HOSPITAL_COMMUNITY): Payer: Self-pay | Admitting: Internal Medicine

## 2023-12-26 ENCOUNTER — Other Ambulatory Visit (HOSPITAL_COMMUNITY): Payer: Self-pay | Admitting: Internal Medicine

## 2023-12-26 DIAGNOSIS — I5033 Acute on chronic diastolic (congestive) heart failure: Secondary | ICD-10-CM

## 2023-12-26 LAB — BASIC METABOLIC PANEL WITH GFR
Anion gap: 7 (ref 5–15)
BUN: 25 mg/dL — ABNORMAL HIGH (ref 8–23)
CO2: 26 mmol/L (ref 22–32)
Calcium: 9 mg/dL (ref 8.9–10.3)
Chloride: 103 mmol/L (ref 98–111)
Creatinine, Ser: 1.37 mg/dL — ABNORMAL HIGH (ref 0.44–1.00)
GFR, Estimated: 36 mL/min — ABNORMAL LOW (ref >60)
Glucose, Bld: 107 mg/dL — ABNORMAL HIGH (ref 70–99)
Potassium: 4.5 mmol/L (ref 3.5–5.1)
Sodium: 136 mmol/L (ref 135–145)

## 2023-12-26 LAB — RETICULOCYTES
Immature Retic Fract: 12.8 % (ref 2.3–15.9)
RBC.: 2.62 MIL/uL — ABNORMAL LOW (ref 3.87–5.11)
Retic Count, Absolute: 37.7 10*3/uL (ref 19.0–186.0)
Retic Ct Pct: 1.4 % (ref 0.4–3.1)

## 2023-12-26 LAB — CBC
HCT: 25 % — ABNORMAL LOW (ref 36.0–46.0)
Hemoglobin: 8.2 g/dL — ABNORMAL LOW (ref 12.0–15.0)
MCH: 31.1 pg (ref 26.0–34.0)
MCHC: 32.8 g/dL (ref 30.0–36.0)
MCV: 94.7 fL (ref 80.0–100.0)
Platelets: 169 10*3/uL (ref 150–400)
RBC: 2.64 MIL/uL — ABNORMAL LOW (ref 3.87–5.11)
RDW: 13.9 % (ref 11.5–15.5)
WBC: 8.9 10*3/uL (ref 4.0–10.5)
nRBC: 0 % (ref 0.0–0.2)

## 2023-12-26 LAB — IRON AND TIBC
Iron: 26 ug/dL — ABNORMAL LOW (ref 28–170)
Saturation Ratios: 10 % — ABNORMAL LOW (ref 10.4–31.8)
TIBC: 274 ug/dL (ref 250–450)
UIBC: 248 ug/dL

## 2023-12-26 LAB — FOLATE: Folate: 9.3 ng/mL (ref >5.9)

## 2023-12-26 LAB — VITAMIN B12: Vitamin B-12: 212 pg/mL (ref 180–914)

## 2023-12-26 LAB — FERRITIN: Ferritin: 133 ng/mL (ref 11–307)

## 2023-12-26 MED ORDER — IRON SUCROSE 300 MG IVPB - SIMPLE MED
300.0000 mg | Status: DC
  Filled 2023-12-26: qty 265

## 2023-12-26 NOTE — Telephone Encounter (Signed)
 Patient referred to infusion pharmacy team for ambulatory infusion of IV iron.  Insurance - I50.33/D50.9 Site of care - Site of care: [facility] Dx code - I50.33 IV Iron Therapy - Feraheme 510 mg IV x 2  Infusion appointments - Scheduling team will schedule patient as soon as possible.  Maximino Cozzolino D. Tanisa Lagace, PharmD

## 2023-12-26 NOTE — TOC Initial Note (Signed)
 Transition of Care [facility]) - Initial/Assessment Note    Patient Details  Name: Kathryn [name] MRN: [id number] Date of Birth: 1927/09/11  [facility]) CM/SW Contact:    Arron Big, LCSWA Phone Number: 12/26/2023, 4:32 PM  Clinical Narrative:     CSW met patient at bedside and introduced self/role. CSW discussed PT recs for SNF. Patient inquired about CIR. CSW informed patient that CIR is intense therapy and she will need 24/7 care post rehab with them. Patient stated she does not have anyone that can care for her and is agreeable to STR. Patient stated she does not want to go to [facility] or [facility].  TOC will continue to follow.             Expected Discharge Plan: Skilled Nursing Facility Barriers to Discharge: Continued Medical Work up, SNF Pending bed offer   Patient Goals and CMS Choice Patient states their goals for this hospitalization and ongoing recovery are:: To get better          Expected Discharge Plan and Services In-house Referral: Clinical Social Work     Living arrangements for the past 2 months: Single Family Home                                      Prior Living Arrangements/Services Living arrangements for the past 2 months: Single Family Home Lives with:: Self Patient language and need for interpreter reviewed:: No Do you feel safe going back to the place where you live?: Yes      Need for Family Participation in Patient Care: No (Comment) Care giver support system in place?: No (comment)   Criminal Activity/Legal Involvement Pertinent to Current Situation/Hospitalization: No - Comment as needed  Activities of Daily Living   ADL Screening (condition at time of admission) Independently performs ADLs?: Yes (appropriate for developmental age) Is the patient deaf or have difficulty hearing?: No Does the patient have difficulty seeing, even when wearing glasses/contacts?: No Does the patient have difficulty  concentrating, remembering, or making decisions?: No  Permission Sought/Granted Permission sought to share information with : Oceanographer granted to share information with : Yes, Verbal Permission Granted     Permission granted to share info w AGENCY: SNFs        Emotional Assessment Appearance:: Appears stated age Attitude/Demeanor/Rapport: Engaged Affect (typically observed): Pleasant Orientation: : Oriented to Self, Oriented to Place, Oriented to  Time, Oriented to Situation Alcohol / Substance Use: Not Applicable Psych Involvement: No (comment)  Admission diagnosis:  Acute on chronic diastolic congestive heart failure (HCC) [I50.33] Acute on chronic diastolic CHF (congestive heart failure) (HCC) [I50.33] Fall, initial encounter [W19.XXXA] Laceration of right lower extremity, initial encounter [S81.811A] Acute on chronic diastolic HF (heart failure) (HCC) [I50.33] Patient Active Problem List   Diagnosis Date Noted   Acute on chronic diastolic HF (heart failure) (HCC) 12/25/2023   Acute on chronic diastolic CHF (congestive heart failure) (HCC) 12/24/2023   Laceration of right leg excluding thigh, initial encounter 12/24/2023   CKD stage 3a, GFR 45-59 ml/min (HCC) 12/24/2023   Ruptured silicone breast implant 08/28/2017   Normal coronary arteries 07/04/2017   Pacemaker battery depletion 09/14/2015   S/P colostomy takedown 05/21/2014   Diverticulitis of colon (without mention of hemorrhage)(562.11) 01/27/2014   Colostomy in place [facility]) 01/27/2014   Paroxysmal atrial fibrillation (HCC) 01/19/2014   Complete heart block (HCC)  01/19/2014   Pacemaker - dual-chamber Medtronic 01/19/2014   Preop cardiovascular exam 01/19/2014   HTN (hypertension) 01/19/2014   PCP:  Aldo Hun, MD Pharmacy:   [facility]) - [place], [place] - [address] [facility] [address] [place] [address] [place] [address] Phone: [phone]  Fax: [phone]  [facility] - [place], [place] - [address] [place] [address] Phone: [phone] Fax: [phone]  [id number] [place], [place] - [address] AT [facility] OF [facility] & [address] [place] [address] Phone: [phone] Fax: [phone]     Social Drivers of Health (SDOH) Social History: SDOH Screenings   Food Insecurity: No Food Insecurity (12/24/2023)  Housing: Low Risk  (12/24/2023)  Transportation Needs: No Transportation Needs (12/24/2023)  Utilities: Not At Risk (12/24/2023)  Social Connections: Patient Declined (12/24/2023)  Tobacco Use: Low Risk  (12/23/2023)   SDOH Interventions:     Readmission Risk Interventions     No data to display

## 2023-12-26 NOTE — NC FL2 (Signed)
 [place]  MEDICAID FL2 LEVEL OF CARE FORM     IDENTIFICATION  Patient Name: Kathryn [name] Birthdate: 05/08/1928 Sex: female Admission Date (Current Location): 12/23/2023  [facility] and [place] Number:  Producer, television/film/video and Address:  [facility] [place], [address], [place], [place] [address]      Provider Number: [id number]  Attending Physician Name and Address:  Deforest Fast, MD  Relative Name and Phone Number:  Bahar Shelden; Dollie [place]; [phone]    Current Level of Care: Hospital Recommended Level of Care: Skilled Nursing Facility Prior Approval Number:    Date Approved/Denied:   PASRR Number: [id number]  Discharge Plan: SNF    Current Diagnoses: Patient Active Problem List   Diagnosis Date Noted   Acute on chronic diastolic HF (heart failure) (HCC) 12/25/2023   Acute on chronic diastolic CHF (congestive heart failure) (HCC) 12/24/2023   Laceration of right leg excluding thigh, initial encounter 12/24/2023   CKD stage 3a, GFR 45-59 ml/min (HCC) 12/24/2023   Ruptured silicone breast implant 08/28/2017   Normal coronary arteries 07/04/2017   Pacemaker battery depletion 09/14/2015   S/P colostomy takedown 05/21/2014   Diverticulitis of colon (without mention of hemorrhage)(562.11) 01/27/2014   Colostomy in place [facility] 01/27/2014   Paroxysmal atrial fibrillation (HCC) 01/19/2014   Complete heart block (HCC) 01/19/2014   Pacemaker - dual-chamber Medtronic 01/19/2014   Preop cardiovascular exam 01/19/2014   HTN (hypertension) 01/19/2014    Orientation RESPIRATION BLADDER Height & Weight     Self, Time, Place, Situation  Normal (Room Air) Continent Weight: 112 lb 4.8 oz (50.9 kg) Height:  5' 4 (162.6 cm)  BEHAVIORAL SYMPTOMS/MOOD NEUROLOGICAL BOWEL NUTRITION STATUS      Continent Diet (Please see discharge summary)  AMBULATORY STATUS COMMUNICATION OF NEEDS Skin   Extensive Assist Verbally Other (Comment) (Wound 12/24/23 1800  Traumatic Pretibial Right)                       Personal Care Assistance Level of Assistance  Feeding, Dressing, Bathing Bathing Assistance: Maximum assistance Feeding assistance: Maximum assistance Dressing Assistance: Maximum assistance     Functional Limitations Info             SPECIAL CARE FACTORS FREQUENCY  PT (By licensed PT), OT (By licensed OT)     PT Frequency: 5x OT Frequency: 5x            Contractures Contractures Info: Not present    Additional Factors Info  Code Status, Allergies Code Status Info: Full Code Allergies Info: Atorvastatin, Darvocet (Propoxyphene N-acetaminophen ), Ketamine , Valsartan, Acetaminophen -codeine, Codeine, Percocet (Oxycodone-acetaminophen ), Procaine, Epinephrine , Ibandronate, Iron, Levofloxacin, Proton Pump Inhibitors           Current Medications (12/26/2023):  This is the current hospital active medication list Current Facility-Administered Medications  Medication Dose Route Frequency Provider Last Rate Last Admin   acetaminophen  (TYLENOL ) tablet 500 mg  500 mg Oral Q6H PRN Opyd, Timothy S, MD       carvedilol  (COREG ) tablet 12.5 mg  12.5 mg Oral BID Opyd, Timothy S, MD   12.5 mg at 12/26/23 9528   furosemide  (LASIX ) injection 20 mg  20 mg Intravenous Once Opyd, Timothy S, MD       HYDROcodone -acetaminophen  (NORCO/VICODIN) 5-325 MG per tablet 1 tablet  1 tablet Oral Q6H PRN Joseph, Preetha, MD   1 tablet at 12/25/23 1700   prochlorperazine (COMPAZINE) injection 5 mg  5 mg Intravenous Q6H PRN Opyd, Santana Cue, MD  senna-docusate (Senokot-S) tablet 1 tablet  1 tablet Oral QHS PRN Opyd, Timothy S, MD       sodium chloride  flush (NS) 0.9 % injection 3 mL  3 mL Intravenous Q12H Opyd, Timothy S, MD   3 mL at 12/25/23 2113   spironolactone  (ALDACTONE ) tablet 12.5 mg  12.5 mg Oral Daily Opyd, Timothy S, MD   12.5 mg at 12/26/23 0914   Tdap (BOOSTRIX ) injection 0.5 mL  0.5 mL Intramuscular Once Rancour, Stephen, MD        traMADol  (ULTRAM ) tablet 50 mg  50 mg Oral Q8H PRN Opyd, Timothy S, MD         Discharge Medications: Please see discharge summary for a list of discharge medications.  Relevant Imaging Results:  Relevant Lab Results:   Additional Information SS# [id number]  Juliane Och, LCSW

## 2023-12-26 NOTE — Progress Notes (Signed)
 Heart Failure Navigator Progress Note  Assessed for Heart & Vascular TOC clinic readiness.  Patient does not meet criteria due to EF 60-65%. No HF TOC per Dr. Drexel Gentles. .   Navigator will sign off at this time.   Randie Bustle, BSN, Scientist, clinical (histocompatibility and immunogenetics) Only

## 2023-12-26 NOTE — Progress Notes (Signed)
 PROGRESS NOTE    Kathryn [name]  UJW:[id number] DOB: May 13, 1928 DOA: 12/23/2023 PCP: Aldo Hun, MD    89/F with history of Takotsubo cardiomyopathy, CKD 3A, paroxysmal A-fib not on anticoagulation, diastolic CHF, heart block with PPM presented to the ED with large right leg rest laceration after a fall.  In addition reported having some issues with shortness of breath and fluid but not consistently using her as needed Lasix .  On 6/16 she struck her right leg/shin against a metal stool resulting in a fall, denies loss of consciousness. - In the ED she was noted to have a large laceration, trauma scans were negative, also noted to be volume overloaded, BNP 500, chest x-ray with small pleural effusions Tdap was given, right leg laceration was sutured, and the patient was given 20 mg IV Lasix .   Subjective: - Feels fair today, no events overnight  Assessment and Plan:  Acute on chronic diastolic CHF  - EF was 60-65% with grade III diastolic dysfunction on TTE from March 2025  - Improving with diuresis, appears euvolemic - Continue Coreg , Aldactone  -Restart oral Lasix  tomorrow -TOC consult for SNF   Right leg laceration  - Tdap given and laceration sutured in ED  - She will need to be evaluated for suture removal in 10-14 days    Acute on chronic anemia Iron deficiency -Patient reports significant blood loss at the time of injury, hemoglobin down to 8, anemia panel with iron deficiency, add IV iron    Left hip pain  - No acute findings on CT or plain film x-rays -PT/OT consulted> SNF recommended   P Atrial fibrillation  - Not anticoagulated d/t patient preference, continue Coreg     CKD 3A  - Appears close to baseline  - Monitor   DVT prophylaxis: SCDs Code Status: Full code Family Communication: None present Disposition Plan: SNF tomorrow  Consultants:    Procedures:   Antimicrobials:    Objective: Vitals:   12/26/23 0001 12/26/23 0426 12/26/23 0736  12/26/23 1115  BP: (!) 114/56 132/63 (!) 124/57 (!) 120/58  Pulse: 70 70 70 70  Resp: 17 19 18 18   Temp: 98 F (36.7 C) 99.5 F (37.5 C) 99.2 F (37.3 C) 97.9 F (36.6 C)  TempSrc: Oral Oral Oral Oral  SpO2: 98% 97% 95% 98%  Weight:  50.9 kg    Height:        Intake/Output Summary (Last 24 hours) at 12/26/2023 1210 Last data filed at 12/26/2023 0431 Gross per 24 hour  Intake 360 ml  Output 1550 ml  Net -1190 ml   Filed Weights   12/24/23 1710 12/25/23 0414 12/26/23 0426  Weight: 53 kg 51.6 kg 50.9 kg    Examination:  General exam: Appears calm and comfortable, AAO x 3 Respiratory system: Decreased breath sounds at the bases Cardiovascular system: S1 & S2 heard, RRR.  Abd: nondistended, soft and nontender.Normal bowel sounds heard. Central nervous system: Alert and oriented. No focal neurological deficits. Extremities: trace edema, right leg with Ace wrap, dressing Skin: As above Psychiatry:  Mood & affect appropriate.     Data Reviewed:   CBC: Recent Labs  Lab 12/23/23 2159 12/25/23 0246 12/26/23 0243  WBC 11.7* 7.3 8.9  NEUTROABS 9.3*  --   --   HGB 10.0* 7.9* 8.2*  HCT 31.2* 24.0* 25.0*  MCV 96.3 92.0 94.7  PLT 227 174 169   Basic Metabolic Panel: Recent Labs  Lab 12/23/23 2159 12/25/23 0246 12/26/23 0243  NA 136 137  136  K 4.1 3.7 4.5  CL 104 105 103  CO2 24 24 26   GLUCOSE 154* 106* 107*  BUN 26* 23 25*  CREATININE 1.22* 1.20* 1.37*  CALCIUM 9.3 9.0 9.0  MG  --  1.5*  --    GFR: Estimated Creatinine Clearance: 20.6 mL/min (A) (by C-G formula based on SCr of 1.37 mg/dL (H)). Liver Function Tests: No results for input(s): AST, ALT, ALKPHOS, BILITOT, PROT, ALBUMIN in the last 168 hours. No results for input(s): LIPASE, AMYLASE in the last 168 hours. No results for input(s): AMMONIA in the last 168 hours. Coagulation Profile: No results for input(s): INR, PROTIME in the last 168 hours. Cardiac Enzymes: No results for  input(s): CKTOTAL, CKMB, CKMBINDEX, TROPONINI in the last 168 hours. BNP (last 3 results) No results for input(s): PROBNP in the last 8760 hours. HbA1C: No results for input(s): HGBA1C in the last 72 hours. CBG: No results for input(s): GLUCAP in the last 168 hours. Lipid Profile: No results for input(s): CHOL, HDL, LDLCALC, TRIG, CHOLHDL, LDLDIRECT in the last 72 hours. Thyroid Function Tests: No results for input(s): TSH, T4TOTAL, FREET4, T3FREE, THYROIDAB in the last 72 hours. Anemia Panel: Recent Labs    12/26/23 0243  VITAMINB12 212  FOLATE 9.3  FERRITIN 133  TIBC 274  IRON 26*  RETICCTPCT 1.4   Urine analysis:    Component Value Date/Time   COLORURINE AMBER (A) 08/27/2013 1236   APPEARANCEUR CLEAR 08/27/2013 1236   LABSPEC 1.020 08/27/2013 1236   PHURINE 5.5 08/27/2013 1236   GLUCOSEU NEGATIVE 08/27/2013 1236   HGBUR SMALL (A) 08/27/2013 1236   BILIRUBINUR NEGATIVE 08/27/2013 1236   KETONESUR NEGATIVE 08/27/2013 1236   PROTEINUR 30 (A) 08/27/2013 1236   UROBILINOGEN 0.2 08/27/2013 1236   NITRITE NEGATIVE 08/27/2013 1236   LEUKOCYTESUR SMALL (A) 08/27/2013 1236   Sepsis Labs: @LABRCNTIP (procalcitonin:4,lacticidven:4)  )No results found for this or any previous visit (from the past 240 hours).   Radiology Studies: No results found.    Scheduled Meds:  carvedilol   12.5 mg Oral BID   furosemide   20 mg Intravenous Once   sodium chloride  flush  3 mL Intravenous Q12H   spironolactone   12.5 mg Oral Daily   Tdap  0.5 mL Intramuscular Once   Continuous Infusions:   LOS: 1 day    Time spent:    Deforest Fast, MD [facility]   12/26/2023, 12:10 PM

## 2023-12-27 DIAGNOSIS — I5033 Acute on chronic diastolic (congestive) heart failure: Secondary | ICD-10-CM | POA: Diagnosis not present

## 2023-12-27 LAB — BASIC METABOLIC PANEL WITH GFR
Anion gap: 11 (ref 5–15)
BUN: 27 mg/dL — ABNORMAL HIGH (ref 8–23)
CO2: 21 mmol/L — ABNORMAL LOW (ref 22–32)
Calcium: 8.9 mg/dL (ref 8.9–10.3)
Chloride: 105 mmol/L (ref 98–111)
Creatinine, Ser: 1.26 mg/dL — ABNORMAL HIGH (ref 0.44–1.00)
GFR, Estimated: 40 mL/min — ABNORMAL LOW (ref >60)
Glucose, Bld: 94 mg/dL (ref 70–99)
Potassium: 4.3 mmol/L (ref 3.5–5.1)
Sodium: 137 mmol/L (ref 135–145)

## 2023-12-27 LAB — CBC
HCT: 24.9 % — ABNORMAL LOW (ref 36.0–46.0)
Hemoglobin: 8.2 g/dL — ABNORMAL LOW (ref 12.0–15.0)
MCH: 30.9 pg (ref 26.0–34.0)
MCHC: 32.9 g/dL (ref 30.0–36.0)
MCV: 94 fL (ref 80.0–100.0)
Platelets: 195 10*3/uL (ref 150–400)
RBC: 2.65 MIL/uL — ABNORMAL LOW (ref 3.87–5.11)
RDW: 14 % (ref 11.5–15.5)
WBC: 8.8 10*3/uL (ref 4.0–10.5)
nRBC: 0 % (ref 0.0–0.2)

## 2023-12-27 MED ORDER — TRAMADOL HCL 50 MG PO TABS: 50.0000 mg | ORAL_TABLET | Freq: Three times a day (TID) | ORAL | 0 refills | Status: DC | PRN

## 2023-12-27 MED ORDER — FUROSEMIDE 20 MG PO TABS: 20.0000 mg | ORAL_TABLET | Freq: Every day | ORAL | Status: DC

## 2023-12-27 NOTE — Plan of Care (Signed)
  Problem: Education: Goal: Knowledge of General Education information will improve Description: Including pain rating scale, medication(s)/side effects and non-pharmacologic comfort measures 12/27/2023 1537 by Arlester Bence, RN Outcome: Adequate for Discharge 12/27/2023 1525 by Arlester Bence, RN Outcome: Progressing   Problem: Health Behavior/Discharge Planning: Goal: Ability to manage health-related needs will improve 12/27/2023 1537 by Arlester Bence, RN Outcome: Adequate for Discharge 12/27/2023 1525 by Arlester Bence, RN Outcome: Progressing   Problem: Clinical Measurements: Goal: Ability to maintain clinical measurements within normal limits will improve 12/27/2023 1537 by Arlester Bence, RN Outcome: Adequate for Discharge 12/27/2023 1525 by Arlester Bence, RN Outcome: Progressing Goal: Will remain free from infection 12/27/2023 1537 by Arlester Bence, RN Outcome: Adequate for Discharge 12/27/2023 1525 by Arlester Bence, RN Outcome: Progressing Goal: Diagnostic test results will improve 12/27/2023 1537 by Arlester Bence, RN Outcome: Adequate for Discharge 12/27/2023 1525 by Arlester Bence, RN Outcome: Progressing Goal: Respiratory complications will improve 12/27/2023 1537 by Arlester Bence, RN Outcome: Adequate for Discharge 12/27/2023 1525 by Arlester Bence, RN Outcome: Progressing Goal: Cardiovascular complication will be avoided 12/27/2023 1537 by Arlester Bence, RN Outcome: Adequate for Discharge 12/27/2023 1525 by Arlester Bence, RN Outcome: Progressing   Problem: Activity: Goal: Risk for activity intolerance will decrease 12/27/2023 1537 by Arlester Bence, RN Outcome: Adequate for Discharge 12/27/2023 1525 by Arlester Bence, RN Outcome: Progressing   Problem: Nutrition: Goal: Adequate nutrition will be maintained 12/27/2023 1537 by Arlester Bence, RN Outcome: Adequate for Discharge 12/27/2023 1525 by Arlester Bence, RN Outcome: Progressing   Problem: Coping: Goal: Level of anxiety will  decrease 12/27/2023 1537 by Arlester Bence, RN Outcome: Adequate for Discharge 12/27/2023 1525 by Arlester Bence, RN Outcome: Progressing   Problem: Skin Integrity: Goal: Risk for impaired skin integrity will decrease 12/27/2023 1537 by Arlester Bence, RN Outcome: Adequate for Discharge 12/27/2023 1525 by Arlester Bence, RN Outcome: Progressing   Problem: Education: Goal: Ability to demonstrate management of disease process will improve 12/27/2023 1537 by Arlester Bence, RN Outcome: Adequate for Discharge 12/27/2023 1525 by Arlester Bence, RN Outcome: Progressing Goal: Ability to verbalize understanding of medication therapies will improve 12/27/2023 1537 by Arlester Bence, RN Outcome: Adequate for Discharge 12/27/2023 1525 by Arlester Bence, RN Outcome: Progressing Goal: Individualized Educational Video(s) 12/27/2023 1537 by Arlester Bence, RN Outcome: Adequate for Discharge 12/27/2023 1525 by Arlester Bence, RN Outcome: Progressing  Report called to [facility] MD aware IV lasix  held due to hypotension

## 2023-12-27 NOTE — Discharge Summary (Signed)
 Physician Discharge Summary  Kathryn [name] NGE:[id number] DOB: 1927/09/01 DOA: 12/23/2023  PCP: Aldo Hun, MD  Admit date: 12/23/2023 Discharge date: 12/27/2023  Time spent: 45 minutes  Recommendations for Outpatient Follow-up:  SNF for short-term rehab PCP in 1 week, please check CBC and BMP at follow-up Needs suture removal in 10 days   Discharge Diagnoses:  Principal Problem:   Acute on chronic diastolic CHF (congestive heart failure) (HCC) Large right leg laceration Acute blood loss anemia   Paroxysmal atrial fibrillation (HCC)   HTN (hypertension)   Laceration of right leg excluding thigh, initial encounter   CKD stage 3a, GFR 45-59 ml/min (HCC)   Acute on chronic diastolic HF (heart failure) (HCC)   Discharge Condition: Improved  Diet recommendation: Low sodium, heart healthy  Filed Weights   12/25/23 0414 12/26/23 0426 12/27/23 0409  Weight: 51.6 kg 50.9 kg 50.6 kg    History of present illness:  89/F with history of Takotsubo cardiomyopathy, CKD 3A, paroxysmal A-fib not on anticoagulation, diastolic CHF, heart block with PPM presented to the ED with large right leg rest laceration after a fall.  In addition reported having some issues with shortness of breath and fluid but not consistently using her as needed Lasix .  On 6/16 she struck her right leg/shin against a metal stool resulting in a fall, denies loss of consciousness. - In the ED she was noted to have a large laceration, trauma scans were negative, also noted to be volume overloaded, BNP 500, chest x-ray with small pleural effusions Tdap was given, right leg laceration was sutured, and the patient was given IV Lasix .  Hospital Course:  Acute on chronic diastolic CHF  - EF was 60-65% with grade III diastolic dysfunction on TTE from March 2025  - Improving with diuresis, appears euvolemic - Continue Coreg , Aldactone  -Restart oral Lasix   -DC to SNF for Rehab    Right leg laceration  - Tdap given  and laceration sutured in ED  - She will need to be evaluated for suture removal in 10-14 days     Acute on chronic anemia Iron deficiency -Patient reports significant blood loss at the time of injury, hemoglobin down to 8, anemia panel with iron deficiency, added IV iron    Left hip pain  - No acute findings on CT or plain film x-rays -PT/OT consulted> SNF recommended   P Atrial fibrillation  - Not anticoagulated d/t patient preference, continue Coreg     CKD 3A  - Appears close to baseline  - Monitor    Discharge Exam: Vitals:   12/26/23 2352 12/27/23 0409  BP: (!) 122/56 130/61  Pulse: 69 69  Resp: 17 18  Temp: 99.2 F (37.3 C) 98.3 F (36.8 C)  SpO2: 96% 97%   General exam: Appears calm and comfortable, AAO x 3 Respiratory system: Decreased breath sounds at the bases Cardiovascular system: S1 & S2 heard, RRR.  Abd: nondistended, soft and nontender.Normal bowel sounds heard. Central nervous system: Alert and oriented. No focal neurological deficits. Extremities: trace edema, right leg with Ace wrap, dressing Skin: As above Psychiatry:  Mood & affect appropriate.   Discharge Instructions   Discharge Instructions     Amb Referral to Intravenous Iron Therapy   Complete by: As directed    You have been referred to [facility] team for IV Iron Infusions. The infusion pharmacy team will reach out to you with appointment information.    Primary Diagnosis Code for IV Iron: D50.9 - Iron deficiency  Anemia   Secondary diagnosis code for IV iron: I50.xx - Heart failure related dx codes      Allergies as of January 10, 2024       Reactions   Atorvastatin Other (See Comments)   Myalgia   Darvocet [propoxyphene N-acetaminophen ] Other (See Comments)   Hallucination   Ketamine  Other (See Comments)   Altered mental status, hallucinations   Valsartan Other (See Comments)   Dizziness, unsteady gait   Acetaminophen -codeine Other (See Comments)   Reaction type/severity  unknown   Codeine Other (See Comments)   Reaction type/severity unknown   Percocet [oxycodone-acetaminophen ] Other (See Comments)   Hallucinations   Procaine Other (See Comments)   Reaction type/severity unknown   Epinephrine  Palpitations, Other (See Comments)   Severe increase in heart rate (has pacemaker)   Ibandronate Other (See Comments)   Difficulty swallowing, concerns about side-effects   Iron Diarrhea, Nausea Only   Levofloxacin Other (See Comments)   Other reaction(s): shoulder pain. Patient has successfully taken ciprofloxacin   Proton Pump Inhibitors Diarrhea        Medication List     STOP taking these medications    olmesartan 40 MG tablet Commonly known as: BENICAR       TAKE these medications    acetaminophen  500 MG tablet Commonly known as: TYLENOL  Take 1 tablet (500 mg total) by mouth every 6 (six) hours as needed.   aspirin EC 81 MG tablet Take 81 mg by mouth every morning.   carvedilol  12.5 MG tablet Commonly known as: COREG  Take 1 tablet (12.5 mg total) by mouth 2 (two) times daily. What changed: Another medication with the same name was removed. Continue taking this medication, and follow the directions you see here.   cholecalciferol 1000 units tablet Commonly known as: VITAMIN D Take 1,000 Units by mouth daily.   CRANBERRY PO Take 1 capsule by mouth daily in the afternoon.   EVENING PRIMROSE OIL PO Take 1 capsule by mouth daily in the afternoon.   furosemide  20 MG tablet Commonly known as: LASIX  Take 1 tablet (20 mg total) by mouth daily. What changed:  medication strength when to take this reasons to take this   ondansetron  4 MG disintegrating tablet Commonly known as: ZOFRAN -ODT Take 4 mg by mouth every 8 (eight) hours as needed for nausea or vomiting.   spironolactone  25 MG tablet Commonly known as: Aldactone  Take 0.5 tablets (12.5 mg total) by mouth daily.   Super Calcium 1500 (600 Ca) MG Tabs tablet Generic drug:  calcium carbonate   SYSTANE OP Place 1 drop into both eyes daily as needed (dry eyes).   TART CHERRY PO Take 3 capsules by mouth daily in the afternoon. Take 1-3 tablets daily   traMADol  50 MG tablet Commonly known as: ULTRAM  Take 1 tablet (50 mg total) by mouth every 8 (eight) hours as needed for moderate pain (pain score 4-6) (mild pain).       Allergies  Allergen Reactions   Atorvastatin Other (See Comments)    Myalgia   Darvocet [Propoxyphene N-Acetaminophen ] Other (See Comments)    Hallucination    Ketamine  Other (See Comments)    Altered mental status, hallucinations   Valsartan Other (See Comments)    Dizziness, unsteady gait   Acetaminophen -Codeine Other (See Comments)    Reaction type/severity unknown   Codeine Other (See Comments)    Reaction type/severity unknown    Percocet Darragh.Curia ] Other (See Comments)    Hallucinations    Procaine Other (See Comments)  Reaction type/severity unknown    Epinephrine  Palpitations and Other (See Comments)    Severe increase in heart rate (has pacemaker)   Ibandronate Other (See Comments)    Difficulty swallowing, concerns about side-effects   Iron Diarrhea and Nausea Only   Levofloxacin Other (See Comments)    Other reaction(s): shoulder pain. Patient has successfully taken ciprofloxacin   Proton Pump Inhibitors Diarrhea      The results of significant diagnostics from this hospitalization (including imaging, microbiology, ancillary and laboratory) are listed below for reference.    Significant Diagnostic Studies: DG Chest 2 View Result Date: 12/24/2023 EXAM: 2 VIEW(S) XRAY OF THE CHEST 12/24/2023 03:03:35 AM COMPARISON: 08/27/2013 CLINICAL HISTORY: Pleural effusion on CT. SOB. Upper chest: Right pleural effusion on CT neck post fall. FINDINGS: LUNGS AND PLEURA: Small bilateral pleural effusions, left greater than right. Chronic right infrahilar opacity, likely scarring. HEART AND MEDIASTINUM: No acute  abnormality of the cardiac and mediastinal silhouettes. BONES AND SOFT TISSUES: No acute osseous abnormality. Right subclavian pacemaker. IMPRESSION: 1. Small bilateral pleural effusions, left greater than right. 2. Chronic right infrahilar opacity, likely scarring. Electronically signed by: Zadie Herter MD 12/24/2023 03:08 AM EDT RP Workstation: [id number]   CT Tibia Fibula Right Wo Contrast Result Date: 12/24/2023 EXAM: CT RIGHT LOWER EXTREMITY, WITHOUT IV CONTRAST 12/24/2023 01:54:43 AM TECHNIQUE: Axial images were acquired through the right lower extremity without IV contrast. Reformatted images were reviewed. Automated exposure control, iterative reconstruction, and/or weight based adjustment of the mA/kV was utilized to reduce the radiation dose to as low as reasonably achievable. COMPARISON: None available. CLINICAL HISTORY: Lower leg trauma. S from home for mechanical fall. No thinners, No LOC. Did not hit head. Large skin tear to R leg. VSS. FINDINGS: BONES AND JOINTS: No fracture or dislocation. Mild tricompartmental degenerative changes in the knee with chondrocalcinosis in the medial and lateral compartments. SOFT TISSUES: Soft tissue laceration along the anterolateral aspect of the mid/distal tibia. No radiopaque foreign body is seen. IMPRESSION: 1. Soft tissue laceration along the anterolateral aspect of the mid/distal tibia without radiopaque foreign body. 2. No fracture or dislocation. Electronically signed by: Zadie Herter MD 12/24/2023 02:02 AM EDT RP Workstation: [id number]   CT PELVIS WO CONTRAST Result Date: 12/24/2023 EXAM: CT PELVIS, WITHOUT IV CONTRAST 12/24/2023 01:54:43 AM TECHNIQUE: Axial images were acquired through the pelvis without IV contrast. Reformatted images were reviewed. Automated exposure control, iterative reconstruction, and/or weight based adjustment of the mA/kV was utilized to reduce the radiation dose to as low as reasonably achievable. COMPARISON: None  available. CLINICAL HISTORY: Hip trauma, fracture suspected, no prior imaging. S from home for mechanical fall. No thinners, No LOC. Did not hit head. Large skin tear to R leg. VSS. FINDINGS: BONES: Bony pelvis and bilateral proximal femurs are intact. No acute fracture or focal osseous lesion. JOINTS: Mild degenerative changes of the bilateral hips. No dislocation. SOFT TISSUES: Status post left hemicolectomy with suture line in the lower pelvis (image 56). 2 nonobstructing right lower pole renal calculi measuring 4 mm (image 5). Status post hysterectomy. Atherosclerotic calcification of the abdominal aorta and bilateral iliacs. IMPRESSION: 1. No traumatic injury to the pelvis. Electronically signed by: Zadie Herter MD 12/24/2023 02:00 AM EDT RP Workstation: [id number]   CT Cervical Spine Wo Contrast Result Date: 12/24/2023 CLINICAL DATA:  Neck trauma, intoxicated or obtunded (Age >= 16y). Fall EXAM: CT CERVICAL SPINE WITHOUT CONTRAST TECHNIQUE: Multidetector CT imaging of the cervical spine was performed without intravenous contrast. Multiplanar CT image reconstructions were  also generated. RADIATION DOSE REDUCTION: This exam was performed according to the departmental dose-optimization program which includes automated exposure control, adjustment of the mA and/or kV according to patient size and/or use of iterative reconstruction technique. COMPARISON:  None Available. FINDINGS: Alignment: Normal. Skull base and vertebrae: Multilevel moderate severe degenerative changes spine. Moderate severe osseous neural a stenosis of the right C5-C6 level. No acute fracture. No aggressive appearing focal osseous lesion or focal pathologic process. Soft tissues and spinal canal: No prevertebral fluid or swelling. No visible canal hematoma. Upper chest: Right pleural effusion. Biapical pleural/pulmonary scarring. Other: None. IMPRESSION: 1. No acute displaced fracture or traumatic listhesis of the cervical spine. 2.  Right pleural effusion. Recommend further evaluation with chest x-ray PA and lateral view. Electronically Signed   By: Morgane  Naveau M.D.   On: 12/24/2023 01:58   CT Head Wo Contrast Result Date: 12/23/2023 CLINICAL DATA:  Fall with head trauma EXAM: CT HEAD WITHOUT CONTRAST TECHNIQUE: Contiguous axial images were obtained from the base of the skull through the vertex without intravenous contrast. RADIATION DOSE REDUCTION: This exam was performed according to the departmental dose-optimization program which includes automated exposure control, adjustment of the mA and/or kV according to patient size and/or use of iterative reconstruction technique. COMPARISON:  CT brain 07/11/2006 FINDINGS: Brain: No acute territorial infarction, hemorrhage or intracranial mass. Patchy white matter hypodensity. Mild atrophy. Prominent ventricles likely related to atrophy Vascular: No hyperdense vessels.  Carotid vascular calcification Skull: Normal. Negative for fracture or focal lesion. Sinuses/Orbits: No acute finding. Other: None IMPRESSION: 1. No CT evidence for acute intracranial abnormality. 2. Atrophy and chronic small vessel ischemic changes of the white matter. Electronically Signed   By: Esmeralda Hedge M.D.   On: 12/23/2023 21:59   DG Tibia/Fibula Right Result Date: 12/23/2023 CLINICAL DATA:  Marvell Slider EXAM: RIGHT TIBIA AND FIBULA - 2 VIEW COMPARISON:  None Available. FINDINGS: Frontal and lateral views of the right tibia and fibula are obtained. There are no acute displaced fractures. Alignment of the right knee and ankle is anatomic. Moderate right knee osteoarthritis with chondrocalcinosis of the medial and lateral compartments. There is diffuse soft tissue swelling throughout the right lower extremity. No radiopaque foreign body. IMPRESSION: 1. Diffuse soft tissue swelling.  No acute fracture. 2. Moderate right knee osteoarthritis. Electronically Signed   By: Bobbye Burrow M.D.   On: 12/23/2023 21:26     Microbiology: No results found for this or any previous visit (from the past 240 hours).   Labs: Basic Metabolic Panel: Recent Labs  Lab 12/23/23 2159 12/25/23 0246 12/26/23 0243 12/27/23 0230  NA 136 137 136 137  K 4.1 3.7 4.5 4.3  CL 104 105 103 105  CO2 24 24 26  21*  GLUCOSE 154* 106* 107* 94  BUN 26* 23 25* 27*  CREATININE 1.22* 1.20* 1.37* 1.26*  CALCIUM 9.3 9.0 9.0 8.9  MG  --  1.5*  --   --    Liver Function Tests: No results for input(s): AST, ALT, ALKPHOS, BILITOT, PROT, ALBUMIN in the last 168 hours. No results for input(s): LIPASE, AMYLASE in the last 168 hours. No results for input(s): AMMONIA in the last 168 hours. CBC: Recent Labs  Lab 12/23/23 2159 12/25/23 0246 12/26/23 0243 12/27/23 0230  WBC 11.7* 7.3 8.9 8.8  NEUTROABS 9.3*  --   --   --   HGB 10.0* 7.9* 8.2* 8.2*  HCT 31.2* 24.0* 25.0* 24.9*  MCV 96.3 92.0 94.7 94.0  PLT 227 174 169  195   Cardiac Enzymes: No results for input(s): CKTOTAL, CKMB, CKMBINDEX, TROPONINI in the last 168 hours. BNP: BNP (last 3 results) Recent Labs    08/23/23 1615 12/24/23 0300  BNP 371.8* 500.4*    ProBNP (last 3 results) No results for input(s): PROBNP in the last 8760 hours.  CBG: No results for input(s): GLUCAP in the last 168 hours.     Signed:  Deforest Fast MD.  [facility] 12/27/2023, 11:22 AM

## 2023-12-27 NOTE — Plan of Care (Signed)

## 2023-12-27 NOTE — TOC Progression Note (Addendum)
 Transition of Care [facility]) - Progression Note    Patient Details  Name: Kathryn [name] MRN: [id number] Date of Birth: [birth year]  Transition of Care [facility]) CM/SW Contact  Kathryn [name], [place] Phone Number: 12/27/2023, 12:35 PM  Clinical Narrative:   CSW met patient at bedside and provided medicare.gov ratings for SNF. Patient chose [facility] at this time. CSW will submit for auth. MD notified.   12:53 PM CSW informed facility that patient chose them. Kathryn [name] has been approved from 12/27/2023-12/31/2023; auth id [id number]. Awaiting facility response on when patient can dc.   TOC will continue to follow.    Expected Discharge Plan: Skilled Nursing Facility Barriers to Discharge: Continued Medical Work up, SNF Pending bed offer  Expected Discharge Plan and Services In-house Referral: Clinical Social Work     Living arrangements for the past 2 months: Single Family Home                                       Social Determinants of Health (SDOH) Interventions SDOH Screenings   Food Insecurity: No Food Insecurity (12/24/2023)  Housing: Low Risk  (12/24/2023)  Transportation Needs: No Transportation Needs (12/24/2023)  Utilities: Not At Risk (12/24/2023)  Social Connections: Patient Declined (12/24/2023)  Tobacco Use: Low Risk  (12/23/2023)    Readmission Risk Interventions     No data to display

## 2023-12-27 NOTE — Progress Notes (Signed)
 Physical Therapy Treatment Patient Details Name: Kathryn [name] MRN: [id number] DOB: April 01, 1928 Today's Date: 12/27/2023   History of Present Illness Pt is a 89 y.o. female who presented to the ED 12/23/23 for fall, laceration of right leg, and CHF exacerbation. PMHx: HTN, CKD stage IIIa, Takotsubo cardiomyopathy, A-fib not on anticoagulation, chronic diastolic CHF, heart block with pacemaker for over 20 years.    PT Comments  Pt greeted supine in bed, pleasant and agreeable to PT session. She advance gait ambulating ~17ft using RW with CGA and a chair follow. Pt demonstrated multiple gait abnormalities with limited ability to adjust despite multi-modal cueing. Distance limited by pain and fatigue. Will continue to follow acutely and advance appropriately.    If plan is discharge home, recommend the following: A lot of help with walking and/or transfers;A lot of help with bathing/dressing/bathroom;Assistance with cooking/housework;Assist for transportation;Help with stairs or ramp for entrance   Can travel by private vehicle     No  Equipment Recommendations  None recommended by PT    Recommendations for Other Services       Precautions / Restrictions Precautions Precautions: Fall Recall of Precautions/Restrictions: Intact Restrictions Weight Bearing Restrictions Per Provider Order: No     Mobility  Bed Mobility Overal bed mobility: Needs Assistance Bed Mobility: Supine to Sit     Supine to sit: HOB elevated, Contact guard     General bed mobility comments: Pt sat up on L side of bed with increased time. CGA for safety.    Transfers Overall transfer level: Needs assistance Equipment used: Rolling walker (2 wheels) Transfers: Sit to/from Stand Sit to Stand: Min assist           General transfer comment: Pt stood from lowest bed height. Cues for hand placement using RW. MinA to power up into standing. Good eccentric control with sitting.     Ambulation/Gait Ambulation/Gait assistance: Contact guard assist, +2 safety/equipment (chair follow) Gait Distance (Feet): 40 Feet Assistive device: Rolling walker (2 wheels) Gait Pattern/deviations: Step-to pattern, Decreased step length - right, Decreased step length - left, Decreased dorsiflexion - right, Decreased dorsiflexion - left, Trunk flexed, Decreased weight shift to right Gait velocity: decreased Gait velocity interpretation: <1.31 ft/sec, indicative of household ambulator   General Gait Details: Pt ambulated with short, slow, labored steps. Cued pt to increase WBing into BUE support on RW to lessen weight in BLE and allow her to advance easier. No LOb.   Stairs             Wheelchair Mobility     Tilt Bed    Modified Rankin (Stroke Patients Only)       Balance Overall balance assessment: Needs assistance Sitting-balance support: No upper extremity supported, Feet supported Sitting balance-Leahy Scale: Fair     Standing balance support: Bilateral upper extremity supported, During functional activity, Reliant on assistive device for balance Standing balance-Leahy Scale: Poor Standing balance comment: Pt dependent on RW and CGA-minA.                            Communication Communication Communication: No apparent difficulties  Cognition Arousal: Alert Behavior During Therapy: WFL for tasks assessed/performed   PT - Cognitive impairments: No apparent impairments                         Following commands: Intact      Cueing Cueing Techniques: Verbal cues, Visual cues, Tactile  cues  Exercises      General Comments General comments (skin integrity, edema, etc.): VSS on RA      Pertinent Vitals/Pain Pain Assessment Pain Assessment: Faces Faces Pain Scale: Hurts even more Pain Location: RLE, L hip, and neck Pain Descriptors / Indicators: Discomfort, Grimacing, Guarding, Aching, Tender, Sore Pain Intervention(s):  Monitored during session, Limited activity within patient's tolerance, Repositioned    Home Living                          Prior Function            PT Goals (current goals can now be found in the care plan section) Acute Rehab PT Goals Patient Stated Goal: Return Home Progress towards PT goals: Progressing toward goals    Frequency    Min 2X/week      PT Plan      Co-evaluation              AM-PAC PT 6 Clicks Mobility   Outcome Measure  Help needed turning from your back to your side while in a flat bed without using bedrails?: A Little Help needed moving from lying on your back to sitting on the side of a flat bed without using bedrails?: A Little Help needed moving to and from a bed to a chair (including a wheelchair)?: A Little Help needed standing up from a chair using your arms (e.g., wheelchair or bedside chair)?: A Little Help needed to walk in hospital room?: Total Help needed climbing 3-5 steps with a railing? : Total 6 Click Score: 14    End of Session Equipment Utilized During Treatment: Gait belt Activity Tolerance: Patient tolerated treatment well;Patient limited by fatigue;Patient limited by pain Patient left: in chair;with call bell/phone within reach;with chair alarm set Nurse Communication: Mobility status;Patient requests pain meds PT Visit Diagnosis: Muscle weakness (generalized) (M62.81);Difficulty in walking, not elsewhere classified (R26.2);Unsteadiness on feet (R26.81);History of falling (Z91.81);Pain Pain - Right/Left: Right Pain - part of body: Leg     Time: 0630-1601 PT Time Calculation (min) (ACUTE ONLY): 20 min  Charges:    $Gait Training: 8-22 mins PT General Charges $$ ACUTE PT VISIT: 1 Visit                     Glenford Lanes, PT, DPT Acute Rehabilitation Services Office: [phone] Secure Chat Preferred  Riva Chester 12/27/2023, 1:30 PM

## 2023-12-27 NOTE — TOC Transition Note (Addendum)
 Transition of Care [facility]) - Discharge Note   Patient Details  Name: Kathryn [name] MRN: [id number] Date of Birth: [birth year]  Transition of Care [facility]) CM/SW Contact:  Ernst Heap Phone Number: [phone] 12/27/2023, 1:36 PM   Clinical Narrative:   CSW is assisting unit SW with dc. CSW met with patient at bedside to share updates. Patient is agreeable to [facility] transportation.   CSW placed PTAR paperwork on chart.   2:00 PM- PTAR was called.       Barriers to Discharge: Continued Medical Work up, SNF Pending bed offer   Patient Goals and CMS Choice Patient states their goals for this hospitalization and ongoing recovery are:: To get better          Discharge Placement                       Discharge Plan and Services Additional resources added to the After Visit Summary for   In-house Referral: Clinical Social Work                                   Social Drivers of Health (SDOH) Interventions SDOH Screenings   Food Insecurity: No Food Insecurity (12/24/2023)  Housing: Low Risk  (12/24/2023)  Transportation Needs: No Transportation Needs (12/24/2023)  Utilities: Not At Risk (12/24/2023)  Social Connections: Patient Declined (12/24/2023)  Tobacco Use: Low Risk  (12/23/2023)     Readmission Risk Interventions     No data to display

## 2023-12-30 DIAGNOSIS — R42 Dizziness and giddiness: Secondary | ICD-10-CM | POA: Diagnosis not present

## 2024-01-07 ENCOUNTER — Telehealth: Payer: Self-pay

## 2024-01-07 NOTE — Telephone Encounter (Signed)
 I left a message for the nurse at [facility].

## 2024-01-07 NOTE — Telephone Encounter (Signed)
 LMOVM letting the patient know we need a manual transmission from her remote monitor.

## 2024-01-08 ENCOUNTER — Encounter (HOSPITAL_COMMUNITY): Payer: Self-pay | Admitting: Emergency Medicine

## 2024-01-08 ENCOUNTER — Inpatient Hospital Stay (HOSPITAL_COMMUNITY)

## 2024-01-08 ENCOUNTER — Other Ambulatory Visit: Payer: Self-pay

## 2024-01-08 ENCOUNTER — Inpatient Hospital Stay (HOSPITAL_COMMUNITY)
Admission: EM | Admit: 2024-01-08 | Discharge: 2024-01-10 | DRG: 603 | Disposition: A | Discharge status: Skilled Nursing Facility | Source: Skilled Nursing Facility | Attending: Internal Medicine | Admitting: Internal Medicine

## 2024-01-08 DIAGNOSIS — I442 Atrioventricular block, complete: Secondary | ICD-10-CM | POA: Diagnosis present

## 2024-01-08 DIAGNOSIS — Z66 Do not resuscitate: Secondary | ICD-10-CM | POA: Diagnosis present

## 2024-01-08 DIAGNOSIS — I4821 Permanent atrial fibrillation: Secondary | ICD-10-CM | POA: Diagnosis present

## 2024-01-08 DIAGNOSIS — Z885 Allergy status to narcotic agent status: Secondary | ICD-10-CM

## 2024-01-08 DIAGNOSIS — Z7982 Long term (current) use of aspirin: Secondary | ICD-10-CM | POA: Diagnosis not present

## 2024-01-08 DIAGNOSIS — I130 Hypertensive heart and chronic kidney disease with heart failure and stage 1 through stage 4 chronic kidney disease, or unspecified chronic kidney disease: Secondary | ICD-10-CM | POA: Diagnosis present

## 2024-01-08 DIAGNOSIS — E785 Hyperlipidemia, unspecified: Secondary | ICD-10-CM | POA: Diagnosis not present

## 2024-01-08 DIAGNOSIS — Z8249 Family history of ischemic heart disease and other diseases of the circulatory system: Secondary | ICD-10-CM

## 2024-01-08 DIAGNOSIS — Z888 Allergy status to other drugs, medicaments and biological substances status: Secondary | ICD-10-CM | POA: Diagnosis not present

## 2024-01-08 DIAGNOSIS — I5033 Acute on chronic diastolic (congestive) heart failure: Secondary | ICD-10-CM | POA: Diagnosis not present

## 2024-01-08 DIAGNOSIS — S81811A Laceration without foreign body, right lower leg, initial encounter: Secondary | ICD-10-CM | POA: Diagnosis present

## 2024-01-08 DIAGNOSIS — Z86718 Personal history of other venous thrombosis and embolism: Secondary | ICD-10-CM

## 2024-01-08 DIAGNOSIS — K219 Gastro-esophageal reflux disease without esophagitis: Secondary | ICD-10-CM | POA: Diagnosis present

## 2024-01-08 DIAGNOSIS — I739 Peripheral vascular disease, unspecified: Secondary | ICD-10-CM | POA: Diagnosis present

## 2024-01-08 DIAGNOSIS — L03115 Cellulitis of right lower limb: Secondary | ICD-10-CM | POA: Diagnosis present

## 2024-01-08 DIAGNOSIS — S86321D Laceration of muscle(s) and tendon(s) of peroneal muscle group at lower leg level, right leg, subsequent encounter: Secondary | ICD-10-CM | POA: Diagnosis present

## 2024-01-08 DIAGNOSIS — Z881 Allergy status to other antibiotic agents status: Secondary | ICD-10-CM

## 2024-01-08 DIAGNOSIS — Z950 Presence of cardiac pacemaker: Secondary | ICD-10-CM

## 2024-01-08 DIAGNOSIS — I5032 Chronic diastolic (congestive) heart failure: Secondary | ICD-10-CM | POA: Diagnosis present

## 2024-01-08 DIAGNOSIS — W19XXXD Unspecified fall, subsequent encounter: Secondary | ICD-10-CM | POA: Diagnosis present

## 2024-01-08 DIAGNOSIS — K5732 Diverticulitis of large intestine without perforation or abscess without bleeding: Secondary | ICD-10-CM | POA: Diagnosis present

## 2024-01-08 DIAGNOSIS — N1831 Chronic kidney disease, stage 3a: Secondary | ICD-10-CM | POA: Diagnosis present

## 2024-01-08 DIAGNOSIS — I480 Paroxysmal atrial fibrillation: Secondary | ICD-10-CM | POA: Diagnosis present

## 2024-01-08 DIAGNOSIS — Z79899 Other long term (current) drug therapy: Secondary | ICD-10-CM

## 2024-01-08 DIAGNOSIS — D631 Anemia in chronic kidney disease: Secondary | ICD-10-CM | POA: Diagnosis present

## 2024-01-08 DIAGNOSIS — I10 Essential (primary) hypertension: Secondary | ICD-10-CM | POA: Diagnosis present

## 2024-01-08 LAB — CBC WITH DIFFERENTIAL/PLATELET
Abs Immature Granulocytes: 0.08 10*3/uL — ABNORMAL HIGH (ref 0.00–0.07)
Basophils Absolute: 0.1 10*3/uL (ref 0.0–0.1)
Basophils Relative: 1 %
Eosinophils Absolute: 0.4 10*3/uL (ref 0.0–0.5)
Eosinophils Relative: 4 %
HCT: 31.4 % — ABNORMAL LOW (ref 36.0–46.0)
Hemoglobin: 10.1 g/dL — ABNORMAL LOW (ref 12.0–15.0)
Immature Granulocytes: 1 %
Lymphocytes Relative: 9 %
Lymphs Abs: 1 10*3/uL (ref 0.7–4.0)
MCH: 31.6 pg (ref 26.0–34.0)
MCHC: 32.2 g/dL (ref 30.0–36.0)
MCV: 98.1 fL (ref 80.0–100.0)
Monocytes Absolute: 1.3 10*3/uL — ABNORMAL HIGH (ref 0.1–1.0)
Monocytes Relative: 11 %
Neutro Abs: 8.5 10*3/uL — ABNORMAL HIGH (ref 1.7–7.7)
Neutrophils Relative %: 74 %
Platelets: 355 10*3/uL (ref 150–400)
RBC: 3.2 MIL/uL — ABNORMAL LOW (ref 3.87–5.11)
RDW: 14 % (ref 11.5–15.5)
WBC: 11.3 10*3/uL — ABNORMAL HIGH (ref 4.0–10.5)
nRBC: 0 % (ref 0.0–0.2)

## 2024-01-08 LAB — BASIC METABOLIC PANEL WITH GFR
Anion gap: 11 (ref 5–15)
BUN: 30 mg/dL — ABNORMAL HIGH (ref 8–23)
CO2: 24 mmol/L (ref 22–32)
Calcium: 10 mg/dL (ref 8.9–10.3)
Chloride: 100 mmol/L (ref 98–111)
Creatinine, Ser: 1.25 mg/dL — ABNORMAL HIGH (ref 0.44–1.00)
GFR, Estimated: 40 mL/min — ABNORMAL LOW (ref >60)
Glucose, Bld: 178 mg/dL — ABNORMAL HIGH (ref 70–99)
Potassium: 4.1 mmol/L (ref 3.5–5.1)
Sodium: 135 mmol/L (ref 135–145)

## 2024-01-08 LAB — PROCALCITONIN: Procalcitonin: <0.1 ng/mL

## 2024-01-08 MED ORDER — HEPARIN SODIUM (PORCINE) 5000 UNIT/ML IJ SOLN
5000.0000 [IU] | Freq: Three times a day (TID) | INTRAMUSCULAR | Status: DC
  Administered 2024-01-08 – 2024-01-10 (×5): 5000 [IU] via SUBCUTANEOUS
  Filled 2024-01-08 (×6): qty 1

## 2024-01-08 MED ORDER — SPIRONOLACTONE 12.5 MG HALF TABLET
12.5000 mg | ORAL_TABLET | Freq: Every day | ORAL | Status: DC
  Administered 2024-01-09 – 2024-01-10 (×2): 12.5 mg via ORAL
  Filled 2024-01-08 (×2): qty 1

## 2024-01-08 MED ORDER — PROMETHAZINE HCL 12.5 MG PO TABS: 12.5000 mg | ORAL_TABLET | Freq: Four times a day (QID) | ORAL | Status: DC | PRN

## 2024-01-08 MED ORDER — SODIUM CHLORIDE 0.9 % IV SOLN
3.0000 g | Freq: Two times a day (BID) | INTRAVENOUS | Status: DC
  Administered 2024-01-08 – 2024-01-10 (×4): 3 g via INTRAVENOUS
  Filled 2024-01-08 (×4): qty 8

## 2024-01-08 MED ORDER — VANCOMYCIN HCL 750 MG/150ML IV SOLN
750.0000 mg | INTRAVENOUS | Status: DC
  Administered 2024-01-08: 750 mg via INTRAVENOUS
  Filled 2024-01-08 (×3): qty 150

## 2024-01-08 MED ORDER — CARVEDILOL 12.5 MG PO TABS
12.5000 mg | ORAL_TABLET | Freq: Two times a day (BID) | ORAL | Status: DC
  Administered 2024-01-08 – 2024-01-10 (×4): 12.5 mg via ORAL
  Filled 2024-01-08 (×4): qty 1

## 2024-01-08 MED ORDER — SODIUM CHLORIDE 0.9% FLUSH
3.0000 mL | Freq: Two times a day (BID) | INTRAVENOUS | Status: DC
  Administered 2024-01-08 – 2024-01-10 (×4): 3 mL via INTRAVENOUS

## 2024-01-08 MED ORDER — BISACODYL 5 MG PO TBEC: 5.0000 mg | DELAYED_RELEASE_TABLET | Freq: Every day | ORAL | Status: DC | PRN

## 2024-01-08 MED ORDER — VITAMIN D 25 MCG (1000 UNIT) PO TABS
1000.0000 [IU] | ORAL_TABLET | Freq: Every day | ORAL | Status: DC
  Administered 2024-01-08 – 2024-01-10 (×3): 1000 [IU] via ORAL
  Filled 2024-01-08 (×3): qty 1

## 2024-01-08 MED ORDER — ASPIRIN 81 MG PO TBEC
81.0000 mg | DELAYED_RELEASE_TABLET | Freq: Every morning | ORAL | Status: DC
  Administered 2024-01-09 – 2024-01-10 (×2): 81 mg via ORAL
  Filled 2024-01-08 (×2): qty 1

## 2024-01-08 MED ORDER — ACETAMINOPHEN 500 MG PO TABS: 500.0000 mg | ORAL_TABLET | Freq: Four times a day (QID) | ORAL | Status: DC | PRN

## 2024-01-08 MED ORDER — SODIUM CHLORIDE 0.9 % IV SOLN
2.0000 g | Freq: Once | INTRAVENOUS | Status: AC
  Administered 2024-01-08: 2 g via INTRAVENOUS
  Filled 2024-01-08: qty 20

## 2024-01-08 MED ORDER — TRAMADOL HCL 50 MG PO TABS: 50.0000 mg | ORAL_TABLET | Freq: Three times a day (TID) | ORAL | Status: DC | PRN

## 2024-01-08 MED ORDER — FUROSEMIDE 20 MG PO TABS
20.0000 mg | ORAL_TABLET | Freq: Every day | ORAL | Status: DC
  Administered 2024-01-08 – 2024-01-10 (×3): 20 mg via ORAL
  Filled 2024-01-08 (×3): qty 1

## 2024-01-08 MED ORDER — ALBUTEROL SULFATE (2.5 MG/3ML) 0.083% IN NEBU
2.5000 mg | INHALATION_SOLUTION | Freq: Four times a day (QID) | RESPIRATORY_TRACT | Status: DC
  Administered 2024-01-08 – 2024-01-09 (×2): 2.5 mg via RESPIRATORY_TRACT
  Filled 2024-01-08 (×2): qty 3

## 2024-01-08 NOTE — Progress Notes (Signed)
 Pharmacy Antibiotic Note  Kathryn [name] is a 89 y.o. female admitted on 01/08/2024 with cellulitis.  Pharmacy has been consulted for vanco / unasyn dosing.  Plan: Vanco 750 mg iv q48h eAUC 452 scr 1.25 Unasyn 3 gram iv q12h  Height: 5' 4 (162.6 cm) Weight: 50.3 kg (111 lb) IBW/kg (Calculated) : 54.7  Temp (24hrs), Avg:97.7 F (36.5 C), Min:97.7 F (36.5 C), Max:97.7 F (36.5 C)  Recent Labs  Lab 01/08/24 1516  WBC 11.3*  CREATININE 1.25*    Estimated Creatinine Clearance: 22.3 mL/min (A) (by C-G formula based on SCr of 1.25 mg/dL (H)).    Allergies  Allergen Reactions   Atorvastatin Other (See Comments)    Myalgia   Darvocet [Propoxyphene N-Acetaminophen ] Other (See Comments)    Hallucination    Ketamine  Other (See Comments)    Altered mental status, hallucinations   Valsartan Other (See Comments)    Dizziness, unsteady gait   Acetaminophen -Codeine Other (See Comments)    Reaction type/severity unknown   Codeine Other (See Comments)    Reaction type/severity unknown    Percocet [Oxycodone-Acetaminophen ] Other (See Comments)    Hallucinations    Procaine Other (See Comments)    Reaction type/severity unknown    Epinephrine  Palpitations and Other (See Comments)    Severe increase in heart rate (has pacemaker)   Ibandronate Other (See Comments)    Difficulty swallowing, concerns about side-effects   Iron  Diarrhea and Nausea Only   Levofloxacin Other (See Comments)    Other reaction(s): shoulder pain. Patient has successfully taken ciprofloxacin   Proton Pump Inhibitors Diarrhea     Thank you for allowing pharmacy to be a part of this patient's care.    Benedetta Heath BS, PharmD, BCPS Clinical Pharmacist 01/08/2024 5:00 PM  Contact: [phone] after 3 PM  Be curious, not judgmental... -Davina Sprinkles

## 2024-01-08 NOTE — Consult Note (Signed)
 WOC Nurse Consult Note: patient had fall and hit R shin on metal stool back 6/17; seen in ED at that time and had 6 dissolvable sutures placed.  Discharged to SNF where wound has worsened Per primary MD note plastic surgery will be consulted morning of 01/09/2024 which was the recommendation of ortho; will place local wound care orders until seen by plastics  Reason for Consult:R leg laceration  Wound type: full thickness R anterior lower leg r/t trauma 50% red moist 50% tan  Pressure Injury POA: NA not pressure  Measurement:see nursing flowsheet  Wound bed: as above  Drainage (amount, consistency, odor) purulent per ED MD note  Periwound: ecchymosis  Dressing procedure/placement/frequency: Cleanse R lower leg wound with Vashe wound cleanser Soila [phone]) do not rinse and allow to air dry. Apply Vashe moistened gauze to wound bed daily, cover with ABD pad and secure with Kerlix roll gauze.  MOISTEN DRESSING WITH NS IF ADHERED TO WOUND BED FOR ATRAUMATIC REMOVAL.   Plastic surgery to be consulted in a.m.  Any wound care orders placed by plastic surgery or ortho supercede wound care orders placed by [facility] RN.   POC discussed with primary nurse. WOC team will not follow. Re-consult if further needs arise.   Thank you,    Powell Bar MSN, RN-BC, [employer]

## 2024-01-08 NOTE — Telephone Encounter (Signed)
 Left a message for the second time.

## 2024-01-08 NOTE — ED Provider Notes (Signed)
 [name] EMERGENCY DEPARTMENT AT [facility] Provider Note   CSN: [id number] Arrival date & time: 01/08/24  1444     Patient presents with: Wound Check   Kathryn [name] is a 89 y.o. female patient with history of peripheral vascular disease who presents to the emergency department today for further evaluation of wound check.  Upon chart review, it was sutured in the emergency department and 6 sutures were placed.  These were Vicryl sutures which should have dissolved.  Based on the chart review patient was not started on antibiotic therapy.  She states that pain has been getting worse.  Patient is unable to ambulate secondary to pain.  She denies fever or chills.    Wound Check       Prior to Admission medications   Medication Sig Start Date End Date Taking? Authorizing Provider  acetaminophen  (TYLENOL ) 500 MG tablet Take 1 tablet (500 mg total) by mouth every 6 (six) hours as needed. 02/09/15   Sebastian Lenis, MD  aspirin EC 81 MG tablet Take 81 mg by mouth every morning.     [provider]  calcium carbonate (SUPER CALCIUM) 1500 (600 Ca) MG TABS tablet  06/02/12   [provider]  carvedilol  (COREG ) 12.5 MG tablet Take 1 tablet (12.5 mg total) by mouth 2 (two) times daily. Patient not taking: Reported on 12/24/2023 08/23/23 11/21/23  Johnson, Kathleen R, PA-C  cholecalciferol (VITAMIN D) 1000 units tablet Take 1,000 Units by mouth daily.    [provider]  CRANBERRY PO Take 1 capsule by mouth daily in the afternoon.    [provider]  EVENING PRIMROSE OIL PO Take 1 capsule by mouth daily in the afternoon.    [provider]  furosemide  (LASIX ) 20 MG tablet Take 1 tablet (20 mg total) by mouth daily. 12/27/23   Fairy Frames, MD  ondansetron  (ZOFRAN -ODT) 4 MG disintegrating tablet Take 4 mg by mouth every 8 (eight) hours as needed for nausea or vomiting. 10/31/23   [provider]  Polyethyl Glycol-Propyl Glycol  (SYSTANE OP) Place 1 drop into both eyes daily as needed (dry eyes).    [provider]  spironolactone  (ALDACTONE ) 25 MG tablet Take 0.5 tablets (12.5 mg total) by mouth daily. 09/23/23   Vicci Rollo SAUNDERS, PA-C  TART CHERRY PO Take 3 capsules by mouth daily in the afternoon. Take 1-3 tablets daily    [provider]  traMADol  (ULTRAM ) 50 MG tablet Take 1 tablet (50 mg total) by mouth every 8 (eight) hours as needed for moderate pain (pain score 4-6) (mild pain). 12/27/23   Fairy Frames, MD    Allergies: Atorvastatin, Darvocet [propoxyphene n-acetaminophen ], Ketamine , Valsartan, Acetaminophen -codeine, Codeine, Percocet [oxycodone-acetaminophen ], Procaine, Epinephrine , Ibandronate, Iron , Levofloxacin, and Proton pump inhibitors    Review of Systems  All other systems reviewed and are negative.   Updated Vital Signs BP 132/63   Pulse 70   Temp 97.7 F (36.5 C) (Oral)   Resp 13   Ht 5' 4 (1.626 m)   Wt 50.3 kg   SpO2 100%   BMI 19.05 kg/m   Physical Exam Vitals and nursing note reviewed.  Constitutional:      General: She is not in acute distress.    Appearance: Normal appearance.  HENT:     Head: Normocephalic and atraumatic.  Eyes:     General:        Right eye: No discharge.        Left eye: No  discharge.  Cardiovascular:     Comments: Regular rate and rhythm.  S1/S2 are distinct without any evidence of murmur, rubs, or gallops.  Radial pulses are 2+ bilaterally.  Dorsalis pedis pulses are 2+ bilaterally.  No evidence of pedal edema. Pulmonary:     Comments: Clear to auscultation bilaterally.  Normal effort.  No respiratory distress.  No evidence of wheezes, rales, or rhonchi heard throughout. Abdominal:     General: Abdomen is flat. Bowel sounds are normal. There is no distension.     Tenderness: There is no abdominal tenderness. There is no guarding or rebound.  Musculoskeletal:        General: Normal range of motion.     Cervical back: Neck  supple.     Comments: 2+ dorsalis pedis pulse felt on the right foot and equal in comparison to the left.  Skin:    General: Skin is warm and dry.     Findings: No rash.     Comments: Poor healing wound over the right anterior shin.  It is draining what appears to be purulent material.  Neurological:     General: No focal deficit present.     Mental Status: She is alert.  Psychiatric:        Mood and Affect: Mood normal.        Behavior: Behavior normal.      (all labs ordered are listed, but only abnormal results are displayed) Labs Reviewed  CBC WITH DIFFERENTIAL/PLATELET - Abnormal; Notable for the following components:      Result Value   WBC 11.3 (*)    RBC 3.20 (*)    Hemoglobin 10.1 (*)    HCT 31.4 (*)    Neutro Abs 8.5 (*)    Monocytes Absolute 1.3 (*)    Abs Immature Granulocytes 0.08 (*)    All other components within normal limits  BASIC METABOLIC PANEL WITH GFR - Abnormal; Notable for the following components:   Glucose, Bld 178 (*)    BUN 30 (*)    Creatinine, Ser 1.25 (*)    GFR, Estimated 40 (*)    All other components within normal limits  MRSA NEXT GEN BY PCR, NASAL  CULTURE, BLOOD (ROUTINE X 2)  CULTURE, BLOOD (ROUTINE X 2)  C-REACTIVE PROTEIN  PROCALCITONIN  MAGNESIUM  CBC WITH DIFFERENTIAL/PLATELET  PROCALCITONIN  C-REACTIVE PROTEIN  BASIC METABOLIC PANEL WITH GFR  PROTIME-INR  TYPE AND SCREEN    EKG: None  Radiology: No results found.   Procedures   Medications Ordered in the ED  cefTRIAXone (ROCEPHIN) 2 g in sodium chloride  0.9 % 100 mL IVPB (2 g Intravenous New Bag/Given 01/08/24 1646)  acetaminophen  (TYLENOL ) tablet 500 mg (has no administration in time range)  aspirin EC tablet 81 mg (has no administration in time range)  carvedilol  (COREG ) tablet 12.5 mg (has no administration in time range)  cholecalciferol (VITAMIN D3) 25 MCG (1000 UNIT) tablet 1,000 Units (has no administration in time range)  furosemide  (LASIX ) tablet 20 mg  (has no administration in time range)  spironolactone  (ALDACTONE ) tablet 12.5 mg (has no administration in time range)  traMADol  (ULTRAM ) tablet 50 mg (has no administration in time range)  heparin  injection 5,000 Units (has no administration in time range)  sodium chloride  flush (NS) 0.9 % injection 3 mL (has no administration in time range)  bisacodyl (DULCOLAX) EC tablet 5 mg (has no administration in time range)  promethazine  (PHENERGAN ) tablet 12.5 mg (has no administration in time range)  albuterol (PROVENTIL) (2.5 MG/3ML) 0.083% nebulizer solution 2.5 mg (has no administration in time range)  vancomycin  (VANCOREADY) IVPB 750 mg/150 mL (has no administration in time range)  Ampicillin-Sulbactam (UNASYN) 3 g in sodium chloride  0.9 % 100 mL IVPB (has no administration in time range)    Clinical Course as of 01/08/24 1705  Wed Jan 08, 2024  1658 I spoke with Dr. Dennise with [facility] hospitalist who agrees to admit the patient. [CF]  1658 I spoke with Dr. Kendal with orthopedics who agrees to consult on the patient.  They will round on her in the morning. [CF]    Clinical Course User Index [CF] Theotis Cameron HERO, PA-C    Medical Decision Making Willadeen Colantuono [name] is a 89 y.o. female patient who presents to the emergency department today for further evaluation of a wound check over the right anterior shin.  This certainly looks worse when she was admitted.  It does appear to be infected.  Patient is nontoxic and nonseptic appearing her vital signs are normal.  Will get some basic labs and a lactic acid.  She would likely to be admitted for IV antibiotics.  Patient admitted to hospitalist service.  She does have evidence of leukocytosis.  Patient is nonseptic.  Vital signs are normal.  Orthopedics will consult.  Patient is stable for admission.  Amount and/or Complexity of Data Reviewed Labs: ordered.  Risk Decision regarding hospitalization.    Final diagnoses:  Cellulitis of right  lower extremity    ED Discharge Orders     None          Theotis Cameron [place], [place] 01/08/24 1705    Simon Lavonia SAILOR, MD 01/09/24 [phone]

## 2024-01-08 NOTE — ED Triage Notes (Signed)
 Pt states that she was here a week ago for a right leg lac. That was covered here. States that she can no longer walk on it

## 2024-01-08 NOTE — H&P (Signed)
 [facility] H&P   Patient Demographics:    Kathryn [name], is a 89 y.o. female  MRN: [id number]   DOB - 09-26-1927  Admit Date - 01/08/2024  Outpatient Primary MD for the patient is Kathryn Anes, MD     Patient coming from: SNF  Chief Complaint  Patient presents with   Wound Check      HPI:    Kathryn [name]  is a 89/F with history of Takotsubo cardiomyopathy, CKD 3A baseline creatinine around 1.3, paroxysmal A-fib not on anticoagulation except on aspirin, chronic diastolic CHF EF 60% on recent echocardiogram done in March, heart block with PPM presented to the ED with nonhealing large right leg rest laceration after a fall she incurred about 2 to 3 weeks ago, she had come to the ER at that time this was sutured in the ER she received tetanus shot and was subsequently admitted for acute on chronic CHF and thereafter discharged to SNF.  According to the patient at SNF she received dressing changes however the wound never improved, she never ran a fever however she continued to have pain upon weightbearing from day 1 which she still has, unable to stand on that leg, the medical staff at the SNF started getting concerned about the way the wound looked and sent her to the ER for evaluation, here she denies any headache, no fever chills no chest pain or shortness of breath, no cough, no diarrhea or dysuria, no focal weakness.  She has some serosanguineous discharge from the wound, pain only upon weightbearing, wound pictured as below, at rest she has no pain in her right lower extremity, no fevers or chills.  In the ER she was diagnosed with nonhealing right lower extremity laceration with mild leukocytosis and possible mild  surrounding cellulitis, orthopedics was consulted and I was requested to admit the patient   Review of systems:    A full 10 point Review of Systems was done, except as stated above, all other Review of Systems were negative.   With Past History of the following :    Past Medical History:  Diagnosis Date   Arthritis    HANDS   Bradycardia    CKD stage 3a, GFR 45-59 ml/min (HCC) 12/24/2023   DVT (deep venous thrombosis) (HCC) 2010   Dysrhythmia    HX OF PAROXYSMAL  ATRIAL FIB- TAKES ASPIRIN FOR BLOOD THINNER   GERD (gastroesophageal reflux disease)    Hypertension    Peripheral vascular disease (HCC)    Blood clot behind Right knee   Presence of permanent cardiac pacemaker    COMPLETE HEART BLOCK--DR. CROITORU       Past Surgical History:  Procedure Laterality Date   ABDOMINAL HYSTERECTOMY     APPENDECTOMY     AUGMENTATION MAMMAPLASTY Bilateral    BREAST ENHANCEMENT SURGERY     BREAST IMPLANT REMOVAL Bilateral 08/28/2017   Procedure: REMOVAL BREAST IMPLANTS BILATERAL;  Surgeon: Lowery Estefana RAMAN, DO;  Location: [facility];  Service: Plastics;  Laterality: Bilateral;   CATARACT EXTRACTION, BILATERAL     CHOLECYSTECTOMY     COLONOSCOPY N/A 03/05/2014   Procedure: COLONOSCOPY;  Surgeon: Alm VEAR Angle, MD;  Location: [facility];  Service: General;  Laterality: N/A;   COLOSTOMY CLOSURE N/A 05/21/2014   Procedure: LAPAROSCOPIC ASSISTED CLOSURE OF COLOSTOMY AND REPAIR OF TRANSVERSE COLON AND ENTEROTOMY;  Surgeon: Alm Angle, MD;  Location: [facility];  Service: General;  Laterality: N/A;   EP IMPLANTABLE DEVICE N/A 10/04/2015   Procedure: PPM/BIV PPM Generator Changeout;  Surgeon: Jerel Balding, MD;  Location: [facility];  Service: Cardiovascular;  Laterality: N/A;   LAPAROTOMY N/A 08/27/2013   Procedure: LAPAROSCOPY CONVERTED TO OPEN LAPAROTOMY WITH SIGMOID COLECTOMY AND COLOSTOMY;  Surgeon: Alm VEAR Angle, MD;  Location: [facility];  Service: General;   Laterality: N/A;   OPEN REDUCTION INTERNAL FIXATION (ORIF) DISTAL RADIAL FRACTURE Left 02/15/2015   Procedure: OPEN TREATMENT OF LEFT DISTAL RADIUS FRACTURE;  Surgeon: Alm Hummer, MD;  Location: [facility];  Service: Orthopedics;  Laterality: Left;   PACEMAKER INSERTION  12/02/1998   medtronic   PERMANENT PACEMAKER GENERATOR CHANGE  02/28/2007   Medtronic Adapta   TONSILLECTOMY     US  ECHOCARDIOGRAPHY  10/09/2011   mild LAE, mild MR,mild to mod TR      Social History:     Social History   Tobacco Use   Smoking status: Never   Smokeless tobacco: Never  Substance Use Topics   Alcohol use: No      Family History :     Family History  Problem Relation Age of Onset   Heart disease Mother    Heart disease Father    Breast cancer Neg Hx        Home Medications:   Prior to Admission medications   Medication Sig Start Date End Date Taking? Authorizing Provider  acetaminophen  (TYLENOL ) 500 MG tablet Take 1 tablet (500 mg total) by mouth every 6 (six) hours as needed. 02/09/15   Hummer Alm, MD  aspirin EC 81 MG tablet Take 81 mg by mouth every morning.     [provider]  calcium carbonate (SUPER CALCIUM) 1500 (600 Ca) MG TABS tablet  06/02/12   [provider]  carvedilol  (COREG ) 12.5 MG tablet Take 1 tablet (12.5 mg total) by mouth 2 (two) times daily. Patient not taking: Reported on 12/24/2023 08/23/23 11/21/23  Johnson, Kathleen R, PA-C  cholecalciferol (VITAMIN D) 1000 units tablet Take 1,000 Units by mouth daily.    [provider]  CRANBERRY PO Take 1 capsule by mouth daily in the afternoon.    [provider]  EVENING PRIMROSE OIL PO Take 1 capsule by mouth daily in the afternoon.    [provider]  furosemide  (LASIX ) 20 MG tablet Take 1 tablet (20 mg total) by mouth  daily. 12/27/23   Fairy Frames, MD  ondansetron  (ZOFRAN -ODT) 4 MG disintegrating tablet Take 4 mg by mouth every 8 (eight) hours as needed  for nausea or vomiting. 10/31/23   [provider]  Polyethyl Glycol-Propyl Glycol (SYSTANE OP) Place 1 drop into both eyes daily as needed (dry eyes).    [provider]  spironolactone  (ALDACTONE ) 25 MG tablet Take 0.5 tablets (12.5 mg total) by mouth daily. 09/23/23   Vicci Rollo SAUNDERS, PA-C  TART CHERRY PO Take 3 capsules by mouth daily in the afternoon. Take 1-3 tablets daily    [provider]  traMADol  (ULTRAM ) 50 MG tablet Take 1 tablet (50 mg total) by mouth every 8 (eight) hours as needed for moderate pain (pain score 4-6) (mild pain). 12/27/23   Fairy Frames, MD     Allergies:     Allergies  Allergen Reactions   Atorvastatin Other (See Comments)    Myalgia   Darvocet [Propoxyphene N-Acetaminophen ] Other (See Comments)    Hallucination    Ketamine  Other (See Comments)    Altered mental status, hallucinations   Valsartan Other (See Comments)    Dizziness, unsteady gait   Acetaminophen -Codeine Other (See Comments)    Reaction type/severity unknown   Codeine Other (See Comments)    Reaction type/severity unknown    Percocet [Oxycodone-Acetaminophen ] Other (See Comments)    Hallucinations    Procaine Other (See Comments)    Reaction type/severity unknown    Epinephrine  Palpitations and Other (See Comments)    Severe increase in heart rate (has pacemaker)   Ibandronate Other (See Comments)    Difficulty swallowing, concerns about side-effects   Iron  Diarrhea and Nausea Only   Levofloxacin Other (See Comments)    Other reaction(s): shoulder pain. Patient has successfully taken ciprofloxacin   Proton Pump Inhibitors Diarrhea     Physical Exam:   Vitals  Blood pressure 132/63, pulse 70, temperature 97.7 F (36.5 C), temperature source Oral, resp. rate 13, height 5' 4 (1.626 m), weight 50.3 kg, SpO2 100%.   1. General Pleasant elderly Caucasian female lying in hospital bed in no apparent distress,  2. Normal affect and insight, Not  Suicidal or Homicidal, Awake Alert,   3. No F.N deficits, ALL C.Nerves Intact, Strength 5/5 all 4 extremities, Sensation intact all 4 extremities, Plantars down going.  4. Ears and Eyes appear Normal, Conjunctivae clear, PERRLA. Moist Oral Mucosa.  5. Supple Neck, No JVD, No cervical lymphadenopathy appriciated, No Carotid Bruits.  6. Symmetrical Chest wall movement, Good air movement bilaterally, CTAB.  7. RRR, No Gallops, Rubs or Murmurs, No Parasternal Heave.  8. Positive Bowel Sounds, Abdomen Soft, No tenderness, No organomegaly appriciated,No rebound -guarding or rigidity.  9.  No Cyanosis, Normal Skin Turgor, No Skin Rash or Bruise.  10. Good muscle tone,  joints appear normal , no effusions, Normal ROM.  11. No Palpable Lymph Nodes in Neck or Axillae  Right lower extremity wound as below     Data Review:   Recent Labs  Lab 01/08/24 1516  WBC 11.3*  HGB 10.1*  HCT 31.4*  PLT 355  MCV 98.1  MCH 31.6  MCHC 32.2  RDW 14.0  LYMPHSABS 1.0  MONOABS 1.3*  EOSABS 0.4  BASOSABS 0.1    Recent Labs  Lab 01/08/24 1516  NA 135  K 4.1  CL 100  CO2 24  ANIONGAP 11  GLUCOSE 178*  BUN 30*  CREATININE 1.25*  CALCIUM 10.0    No results found for:  CHOL, HDL, LDLCALC, LDLDIRECT, TRIG, CHOLHDL  Recent Labs  Lab 01/08/24 1516  CALCIUM 10.0    Recent Labs  Lab 01/08/24 1516  WBC 11.3*  PLT 355  CREATININE 1.25*    Urinalysis    Component Value Date/Time   COLORURINE AMBER (A) 08/27/2013 1236   APPEARANCEUR CLEAR 08/27/2013 1236   LABSPEC 1.020 08/27/2013 1236   PHURINE 5.5 08/27/2013 1236   GLUCOSEU NEGATIVE 08/27/2013 1236   HGBUR SMALL (A) 08/27/2013 1236   BILIRUBINUR NEGATIVE 08/27/2013 1236   KETONESUR NEGATIVE 08/27/2013 1236   PROTEINUR 30 (A) 08/27/2013 1236   UROBILINOGEN 0.2 08/27/2013 1236   NITRITE NEGATIVE 08/27/2013 1236   LEUKOCYTESUR SMALL (A) 08/27/2013 1236      Imaging Results:   Baseline EKG and chest x-ray  ordered.   Assessment & Plan:   1.  Right lower extremity laceration present on admission, patient incurred this few weeks ago due to mechanical injury.  This was initially repaired in the ER 2 weeks ago, wound not healing for 2 weeks and now possible mild surrounding cellulitis.  She will be admitted to the hospital, she received tetanus shot last time she was in the ER few weeks ago, will be placed on empiric IV antibiotics, draw blood cultures and inflammatory markers.  I have consulted orthopedics but was recommended to call plastic surgery in the morning, note there is no plastic surgery on-call after 3 PM today.  2.  History of chronic diastolic CHF EF 60%.  Currently compensated.  Home diuretics continue to monitor with caution.  3.  History of sick sinus syndrome, paroxysmal A-fib, permanent pacemaker.  Continue home beta-blocker, not on anticoagulation, for now continue aspirin  4.  CKD stage IIIa.  Baseline creatinine around 1.3.  At baseline, monitor with caution with ongoing home diuretics at low-dose.  5.  Chronic anemia.  Monitor, type screen and INR check, in case patient needs surgery she is agreeable for blood transfusion.   DVT Prophylaxis Heparin    AM Labs Ordered, also please review Full Orders  Family Communication: Admission, patients condition and plan of care including tests being ordered have been discussed with the patient  who indicates understanding and agree with the plan and Code Status.  Code Status DNR  Likely DC to  SNF  Condition Fair  Consults called: Orthopedics, requested to call plastics tomorrow.  Plastics not on-call after 3 PM today.  Admission status: Inpatient  Time spent in minutes : 45 minutes  Signature  -    Lavada Stank M.D on 01/08/2024 at 4:58 PM   -  To page go to [email or web address]

## 2024-01-09 DIAGNOSIS — S86321D Laceration of muscle(s) and tendon(s) of peroneal muscle group at lower leg level, right leg, subsequent encounter: Secondary | ICD-10-CM | POA: Diagnosis not present

## 2024-01-09 LAB — BASIC METABOLIC PANEL WITH GFR
Anion gap: 12 (ref 5–15)
BUN: 28 mg/dL — ABNORMAL HIGH (ref 8–23)
CO2: 22 mmol/L (ref 22–32)
Calcium: 9.5 mg/dL (ref 8.9–10.3)
Chloride: 101 mmol/L (ref 98–111)
Creatinine, Ser: 1.25 mg/dL — ABNORMAL HIGH (ref 0.44–1.00)
GFR, Estimated: 40 mL/min — ABNORMAL LOW (ref >60)
Glucose, Bld: 100 mg/dL — ABNORMAL HIGH (ref 70–99)
Potassium: 4 mmol/L (ref 3.5–5.1)
Sodium: 135 mmol/L (ref 135–145)

## 2024-01-09 LAB — CBC WITH DIFFERENTIAL/PLATELET
Abs Immature Granulocytes: 0.04 10*3/uL (ref 0.00–0.07)
Basophils Absolute: 0.1 10*3/uL (ref 0.0–0.1)
Basophils Relative: 1 %
Eosinophils Absolute: 0.5 10*3/uL (ref 0.0–0.5)
Eosinophils Relative: 6 %
HCT: 27.6 % — ABNORMAL LOW (ref 36.0–46.0)
Hemoglobin: 8.9 g/dL — ABNORMAL LOW (ref 12.0–15.0)
Immature Granulocytes: 0 %
Lymphocytes Relative: 9 %
Lymphs Abs: 0.9 10*3/uL (ref 0.7–4.0)
MCH: 30.8 pg (ref 26.0–34.0)
MCHC: 32.2 g/dL (ref 30.0–36.0)
MCV: 95.5 fL (ref 80.0–100.0)
Monocytes Absolute: 1.1 10*3/uL — ABNORMAL HIGH (ref 0.1–1.0)
Monocytes Relative: 11 %
Neutro Abs: 7 10*3/uL (ref 1.7–7.7)
Neutrophils Relative %: 73 %
Platelets: 317 10*3/uL (ref 150–400)
RBC: 2.89 MIL/uL — ABNORMAL LOW (ref 3.87–5.11)
RDW: 13.9 % (ref 11.5–15.5)
WBC: 9.6 10*3/uL (ref 4.0–10.5)
nRBC: 0 % (ref 0.0–0.2)

## 2024-01-09 LAB — PROCALCITONIN: Procalcitonin: <0.1 ng/mL

## 2024-01-09 LAB — PROTIME-INR
INR: 1.2 (ref 0.8–1.2)
Prothrombin Time: 15.9 s — ABNORMAL HIGH (ref 11.4–15.2)

## 2024-01-09 LAB — C-REACTIVE PROTEIN: CRP: 3.8 mg/dL — ABNORMAL HIGH (ref <1.0)

## 2024-01-09 LAB — MAGNESIUM: Magnesium: 1.8 mg/dL (ref 1.7–2.4)

## 2024-01-09 MED ORDER — ALBUTEROL SULFATE (2.5 MG/3ML) 0.083% IN NEBU: 2.5000 mg | INHALATION_SOLUTION | Freq: Four times a day (QID) | RESPIRATORY_TRACT | Status: DC | PRN

## 2024-01-09 NOTE — Plan of Care (Signed)

## 2024-01-09 NOTE — TOC Progression Note (Signed)
 Transition of Care [facility]) - Progression Note    Patient Details  Name: Nastassja Witkop O'Daniel MRN: [id number] Date of Birth: Jan 03, 1928  [facility]) CM/SW Contact  Tiara Maultsby A [place], LCSW Phone Number: 01/09/2024, 4:33 PM  Clinical Narrative:     CSW was notified by provider that pt may be stable for DC tomorrow.   CSW notified by [place] at [facility], asked about bed days, pt is not in her co-pay days at [facility]. Can DC back to facility tomorrow, must be admitted by 2pm.   Auth ID [id number], status pending.   TOC will continue to follow.     Expected Discharge Plan: Skilled Nursing Facility Barriers to Discharge: English as a second language teacher, Continued Medical Work up  Expected Discharge Plan and Services       Living arrangements for the past 2 months: Skilled Nursing Facility, Single Family Home                                       Social Determinants of Health (SDOH) Interventions SDOH Screenings   Food Insecurity: No Food Insecurity (01/09/2024)  Housing: Low Risk  (01/09/2024)  Transportation Needs: No Transportation Needs (01/09/2024)  Utilities: Not At Risk (01/09/2024)  Social Connections: Patient Declined (01/09/2024)  Tobacco Use: Low Risk  (01/08/2024)    Readmission Risk Interventions    01/09/2024   12:20 PM  Readmission Risk Prevention Plan  Transportation Screening Complete  PCP or Specialist Appt within 5-7 Days Complete  Home Care Screening Complete  Medication Review (RN CM) Complete

## 2024-01-09 NOTE — Progress Notes (Addendum)
 Progress Note    Kathryn [name]  FMW:[id number] DOB: [birth year]  DOA: 01/08/2024 PCP: Shayne Anes, MD      Brief Narrative:    Medical records reviewed and are as summarized below:  Kathryn [name] is a 89 y.o. female with history of Takotsubo cardiomyopathy, CKD 3A baseline creatinine around 1.3, paroxysmal A-fib not on anticoagulation except on aspirin, chronic diastolic CHF EF 60% on recent echocardiogram done in March 2025, heart block with PPM presented to the ED with nonhealing large right leg laceration wound after a fall she incurred about 2 to 3 weeks prior to admission.  She had come to the ER, and at that time this was sutured in the ER.  She received tetanus shot and was subsequently admitted for acute on chronic CHF and thereafter discharged to SNF.   Patient said she has been receiving dressing changes at SNF.  However, the wound has not improved.  She developed increasing pain as she bear weight on the right leg.  She also noticed drainage from the right leg wound.  She was sent to the ED for further evaluation.  She was admitted to the hospital for nonhealing right leg laceration wound with surrounding right leg cellulitis.   Assessment/Plan:   Principal Problem:   Laceration of muscle(s) and tendon(s) of peroneal muscle group at lower leg level, right leg, subsequent encounter Active Problems:   Paroxysmal atrial fibrillation (HCC)   HTN (hypertension)   Diverticulitis of colon (without mention of hemorrhage)(562.11)   Laceration of right leg excluding thigh, initial encounter   CKD stage 3a, GFR 45-59 ml/min (HCC)    Body mass index is 19.05 kg/m.   Right leg laceration open wound from a fall on 12/23/2023, right leg cellulitis: Continue IV Unasyn.  Analgesics as needed for pain.  Patient was evaluated by Dr. Waddell, plastic surgeon.  No indication for surgery at this time.  He ordered wound dressing changes.   Chronic diastolic CHF with EF of  60%: Compensated.  Continue Lasix  and spironolactone    Paroxysmal atrial fibrillation, permanent pacemaker in place for history of sick sinus syndrome: Continue carvedilol  and aspirin.  She is not on long-term anticoagulation.   Comorbidities include chronic anemia, CKD stage IIIa.  Noted drop in hemoglobin from 10.1-8.9.  Repeat H&H tomorrow.  Diet Order             Diet 2 gram sodium Room service appropriate? Yes; Fluid consistency: Thin; Fluid restriction: 1500 mL Fluid  Diet effective now                            Consultants: Plastic surgeon  Procedures: None    Medications:    aspirin EC  81 mg Oral q morning   carvedilol   12.5 mg Oral BID   cholecalciferol  1,000 Units Oral Daily   furosemide   20 mg Oral Daily   heparin   5,000 Units Subcutaneous Q8H   sodium chloride  flush  3 mL Intravenous Q12H   spironolactone   12.5 mg Oral Daily   Continuous Infusions:  ampicillin-sulbactam (UNASYN) IV 3 g (01/09/24 0912)   vancomycin  Stopped (01/08/24 2054)     Anti-infectives (From admission, onward)    Start     Dose/Rate Route Frequency Ordered Stop   01/08/24 2100  Ampicillin-Sulbactam (UNASYN) 3 g in sodium chloride  0.9 % 100 mL IVPB        3 g 200 mL/hr over 30  Minutes Intravenous Every 12 hours 01/08/24 1659     01/08/24 1700  vancomycin  (VANCOREADY) IVPB 750 mg/150 mL        750 mg 150 mL/hr over 60 Minutes Intravenous Every 48 hours 01/08/24 1659     01/08/24 1630  cefTRIAXone (ROCEPHIN) 2 g in sodium chloride  0.9 % 100 mL IVPB        2 g 200 mL/hr over 30 Minutes Intravenous  Once 01/08/24 1622 01/08/24 1845              Family Communication/Anticipated D/C date and plan/Code Status   DVT prophylaxis: heparin  injection 5,000 Units Start: 01/08/24 1700     Code Status: Limited: Do not attempt resuscitation (DNR) -DNR-LIMITED -Do Not Intubate/DNI   Family Communication: None Disposition Plan: Plan to discharge home   Status  is: Inpatient Remains inpatient appropriate because: Right leg open wound with cellulitis       Subjective:     Interval events noted.  She complains of pain and drainage from the right leg wound.  Objective:    Vitals:   01/09/24 0501 01/09/24 0817 01/09/24 0902 01/09/24 0933  BP: (!) 159/69  (!) 159/69 (!) 182/129  Pulse: 73  73 70  Resp: 18   (!) 22  Temp: 97.6 F (36.4 C)   (!) 97.5 F (36.4 C)  TempSrc:      SpO2: 99% 99%  100%  Weight:      Height:       No data found.   Intake/Output Summary (Last 24 hours) at 01/09/2024 1243 Last data filed at 01/09/2024 1029 Gross per 24 hour  Intake 253.51 ml  Output --  Net 253.51 ml   Filed Weights   01/08/24 1450  Weight: 50.3 kg    Exam:  GEN: NAD SKIN: Warm and dry EYES: No pallor or icterus ENT: MMM CV: RRR PULM: CTA B ABD: soft, ND, NT, +BS CNS: AAO x 3, non focal EXT: Open wound on right anterior lower leg with surrounding erythematous changes.  Right leg tenderness        Data Reviewed:   I have personally reviewed following labs and imaging studies:  Labs: Labs show the following:   Basic Metabolic Panel: Recent Labs  Lab 01/08/24 1516 01/09/24 0345  NA 135 135  K 4.1 4.0  CL 100 101  CO2 24 22  GLUCOSE 178* 100*  BUN 30* 28*  CREATININE 1.25* 1.25*  CALCIUM 10.0 9.5  MG  --  1.8   GFR Estimated Creatinine Clearance: 22.3 mL/min (A) (by C-G formula based on SCr of 1.25 mg/dL (H)). Liver Function Tests: No results for input(s): AST, ALT, ALKPHOS, BILITOT, PROT, ALBUMIN in the last 168 hours. No results for input(s): LIPASE, AMYLASE in the last 168 hours. No results for input(s): AMMONIA in the last 168 hours. Coagulation profile Recent Labs  Lab 01/09/24 0345  INR 1.2    CBC: Recent Labs  Lab 01/08/24 1516 01/09/24 0345  WBC 11.3* 9.6  NEUTROABS 8.5* 7.0  HGB 10.1* 8.9*  HCT 31.4* 27.6*  MCV 98.1 95.5  PLT 355 317   Cardiac Enzymes: No results  for input(s): CKTOTAL, CKMB, CKMBINDEX, TROPONINI in the last 168 hours. BNP (last 3 results) No results for input(s): PROBNP in the last 8760 hours. CBG: No results for input(s): GLUCAP in the last 168 hours. D-Dimer: No results for input(s): DDIMER in the last 72 hours. Hgb A1c: No results for input(s): HGBA1C in the last 72  hours. Lipid Profile: No results for input(s): CHOL, HDL, LDLCALC, TRIG, CHOLHDL, LDLDIRECT in the last 72 hours. Thyroid function studies: No results for input(s): TSH, T4TOTAL, T3FREE, THYROIDAB in the last 72 hours.  Invalid input(s): FREET3 Anemia work up: No results for input(s): VITAMINB12, FOLATE, FERRITIN, TIBC, IRON , RETICCTPCT in the last 72 hours. Sepsis Labs: Recent Labs  Lab 01/08/24 1516 01/09/24 0345  PROCALCITON <0.10 <0.10  WBC 11.3* 9.6    Microbiology Recent Results (from the past 240 hours)  Culture, blood (Routine X 2) w Reflex to ID Panel     Status: None (Preliminary result)   Collection Time: 01/09/24  3:45 AM   Specimen: BLOOD RIGHT HAND  Result Value Ref Range Status   Specimen Description BLOOD RIGHT HAND  Final   Special Requests   Final    BOTTLES DRAWN AEROBIC AND ANAEROBIC Blood Culture results may not be optimal due to an inadequate volume of blood received in culture bottles   Culture   Final    NO GROWTH < 12 HOURS Performed at [facility], [address], [place], [place] [address]    Report Status PENDING  Incomplete  Culture, blood (Routine X 2) w Reflex to ID Panel     Status: None (Preliminary result)   Collection Time: 01/09/24  3:51 AM   Specimen: BLOOD RIGHT ARM  Result Value Ref Range Status   Specimen Description BLOOD RIGHT ARM  Final   Special Requests   Final    BOTTLES DRAWN AEROBIC AND ANAEROBIC Blood Culture adequate volume   Culture   Final    NO GROWTH < 12 HOURS Performed at [facility], [address] [place], [place]  [address]    Report Status PENDING  Incomplete    Procedures and diagnostic studies:  DG Chest Port 1 View Result Date: 01/08/2024 CLINICAL DATA:  Shortness of breath. EXAM: PORTABLE CHEST 1 VIEW COMPARISON:  Chest radiograph dated 12/24/2023. FINDINGS: Trace bilateral pleural effusions. No consolidative changes. There is no pleural effusion pneumothorax. Background of emphysema and chronic interstitial coarsening. Mild cardiomegaly. Right pectoral pacemaker device. Osteopenia with degenerative changes. No acute osseous pathology. IMPRESSION: Trace bilateral pleural effusions. No consolidative changes. Electronically Signed   By: Vanetta Chou M.D.   On: 01/08/2024 17:32               LOS: 1 day   Joyclyn Plazola  [facility]   Pager on [email or web address]. If 7PM-7AM, please contact night-coverage at [email or web address]     01/09/2024, 12:43 PM

## 2024-01-09 NOTE — Telephone Encounter (Signed)
 Pt is no longer at [facility]. It looks like she has been admitted to the hospital.

## 2024-01-09 NOTE — [place] FL2 (Signed)
 [place]  MEDICAID [id number] LEVEL OF CARE FORM     IDENTIFICATION  Patient Name: Kathryn [name] Birthdate: 29-Apr-1928 Sex: female Admission Date (Current Location): 01/08/2024  [facility] and [place] Number:  Producer, television/film/video and Address:  [facility] [place], [address] [place], [place] [address]      Provider Number: [id number]  Attending Physician Name and Address:  Jens Durand, MD  Relative Name and Phone Number:  Micael Darina Rhody)  [phone]    Current Level of Care: Hospital Recommended Level of Care: Skilled Nursing Facility Prior Approval Number:    Date Approved/Denied:   PASRR Number: [id number]  Discharge Plan: SNF    Current Diagnoses: Patient Active Problem List   Diagnosis Date Noted   Laceration of muscle(s) and tendon(s) of peroneal muscle group at lower leg level, right leg, subsequent encounter 01/08/2024   Acute on chronic diastolic HF (heart failure) (HCC) 12/25/2023   Acute on chronic diastolic CHF (congestive heart failure) (HCC) 12/24/2023   Laceration of right leg excluding thigh, initial encounter 12/24/2023   CKD stage 3a, GFR 45-59 ml/min (HCC) 12/24/2023   Ruptured silicone breast implant 08/28/2017   Normal coronary arteries 07/04/2017   Pacemaker battery depletion 09/14/2015   S/P colostomy takedown 05/21/2014   Diverticulitis of colon (without mention of hemorrhage)(562.11) 01/27/2014   Colostomy in [facility]) 01/27/2014   Paroxysmal atrial fibrillation (HCC) 01/19/2014   Complete heart block (HCC) 01/19/2014   Pacemaker - dual-chamber Medtronic 01/19/2014   Preop cardiovascular exam 01/19/2014   HTN (hypertension) 01/19/2014    Orientation RESPIRATION BLADDER Height & Weight     Self, Time, Situation, Place  Normal Continent Weight: 111 lb (50.3 kg) Height:  5' 4 (162.6 cm)  BEHAVIORAL SYMPTOMS/MOOD NEUROLOGICAL BOWEL NUTRITION STATUS      Continent Diet (see DC summary)  AMBULATORY STATUS  COMMUNICATION OF NEEDS Skin   Limited Assist Verbally Other (Comment) (Wound 12/24/23 1800 Traumatic Pretibial Right)                       Personal Care Assistance Level of Assistance  Bathing, Feeding, Dressing Bathing Assistance: Limited assistance Feeding assistance: Limited assistance Dressing Assistance: Limited assistance     Functional Limitations Info  Sight, Hearing, Speech Sight Info: Adequate Hearing Info: Adequate Speech Info: Adequate    SPECIAL CARE FACTORS FREQUENCY  PT (By licensed PT), OT (By licensed OT)     PT Frequency: 5x/week OT Frequency: 5x/week            Contractures Contractures Info: Not present    Additional Factors Info  Code Status, Allergies Code Status Info: DNR Allergies Info: Atorvastatin  Darvocet (Propoxyphene N-acetaminophen )  Ketamine   Valsartan  Acetaminophen -codeine  Codeine  Percocet (Oxycodone-acetaminophen )  Procaine  Epinephrine   Ibandronate  Iron   Levofloxacin  Proton Pump Inhibitors           Current Medications (01/09/2024):  This is the current hospital active medication list Current Facility-Administered Medications  Medication Dose Route Frequency Provider Last Rate Last Admin   acetaminophen  (TYLENOL ) tablet 500 mg  500 mg Oral Q6H PRN Singh, Prashant K, MD       albuterol (PROVENTIL) (2.5 MG/3ML) 0.083% nebulizer solution 2.5 mg  2.5 mg Nebulization Q6H PRN Jens Durand, MD       Ampicillin-Sulbactam (UNASYN) 3 g in sodium chloride  0.9 % 100 mL IVPB  3 g Intravenous Q12H Reome, Earle J, RPH 200 mL/hr at 01/09/24 0912 3 g at 01/09/24  9087   aspirin EC tablet 81 mg  81 mg Oral q morning Singh, Prashant K, MD   81 mg at 01/09/24 0902   bisacodyl (DULCOLAX) EC tablet 5 mg  5 mg Oral Daily PRN Singh, Prashant K, MD       carvedilol  (COREG ) tablet 12.5 mg  12.5 mg Oral BID Singh, Prashant K, MD   12.5 mg at 01/09/24 0902   cholecalciferol (VITAMIN D3) 25 MCG (1000 UNIT) tablet 1,000 Units  1,000 Units Oral Daily  Singh, Prashant K, MD   1,000 Units at 01/09/24 0902   furosemide  (LASIX ) tablet 20 mg  20 mg Oral Daily Singh, Prashant K, MD   20 mg at 01/09/24 0902   heparin  injection 5,000 Units  5,000 Units Subcutaneous Q8H Singh, Prashant K, MD   5,000 Units at 01/09/24 1513   promethazine  (PHENERGAN ) tablet 12.5 mg  12.5 mg Oral Q6H PRN Singh, Prashant K, MD       sodium chloride  flush (NS) 0.9 % injection 3 mL  3 mL Intravenous Q12H Singh, Prashant K, MD   3 mL at 01/09/24 1029   spironolactone  (ALDACTONE ) tablet 12.5 mg  12.5 mg Oral Daily Singh, Prashant K, MD   12.5 mg at 01/09/24 0902   traMADol  (ULTRAM ) tablet 50 mg  50 mg Oral Q8H PRN Singh, Prashant K, MD       vancomycin  (VANCOREADY) IVPB 750 mg/150 mL  750 mg Intravenous Q48H Reome, Earle J, RPH   Stopped at 01/08/24 2054     Discharge Medications: Please see discharge summary for a list of discharge medications.  Relevant Imaging Results:  Relevant Lab Results:   Additional Information SS# [id number]  Keyia Moretto A [place], LCSW

## 2024-01-09 NOTE — Evaluation (Signed)
 Physical Therapy Evaluation Patient Details Name: Kathryn [name] MRN: [id number] DOB: February 13, 1928 Today's Date: 01/09/2024  History of Present Illness  Pt is a 89 y.o. female admitted 7/2 from [facility] SNF for non healing R LE wound with pain with weightbearing PMH: hospitalized 6/17 for fall, laceration of right leg, and CHF exacerbation, pacemaker, PVD, HTN, DVT, Takotsubo cardiomyopathy, atrial fibrillation not anticoagulated, chronic diastolic CHF  Clinical Impression  PTA pt was at [facility] SNF to rehab from fall with RLE laceration and CHF exacerbation. Pt reports use of RW and wheelchair to get around facility and setup for self care, meals provided in room. Pt is currently limited in safe mobility by increased pain with weightbearing in presence of generalized weakness and associated decrease in balance. Pt is currently supervision for bed mobility and contact guard for transfers and very short distance ambulation. Pt request return to [facility] at discharge to continue to work on mobility to be able to eventually go home. PT and Mobility Specialist with continue to follow acutely.         If plan is discharge home, recommend the following: A little help with walking and/or transfers;A lot of help with bathing/dressing/bathroom;Assistance with cooking/housework;Direct supervision/assist for medications management;Direct supervision/assist for financial management;Assist for transportation;Help with stairs or ramp for entrance;Supervision due to cognitive status   Can travel by private vehicle   No    Equipment Recommendations None recommended by PT     Functional Status Assessment Patient has had a recent decline in their functional status and demonstrates the ability to make significant improvements in function in a reasonable and predictable amount of time.     Precautions / Restrictions Precautions Precautions: Fall Recall of Precautions/Restrictions:  Intact Restrictions Weight Bearing Restrictions Per Provider Order: No      Mobility  Bed Mobility Overal bed mobility: Needs Assistance Bed Mobility: Supine to Sit     Supine to sit: Modified independent (Device/Increase time), HOB elevated Sit to supine: Supervision   General bed mobility comments: pt able to come to EoB with use of bed rail, increased effort but pt able to return LE to bed    Transfers Overall transfer level: Needs assistance Equipment used: Rolling walker (2 wheels) Transfers: Sit to/from Stand Sit to Stand: Contact guard assist           General transfer comment: pt able to stand from bed with out assist and self steady CGA for safety    Ambulation/Gait Ambulation/Gait assistance: Contact guard assist Gait Distance (Feet): 18 Feet Assistive device: Rolling walker (2 wheels) Gait Pattern/deviations: Step-to pattern, Shuffle, Antalgic, Decreased weight shift to right, Narrow base of support Gait velocity: decreased Gait velocity interpretation: <1.31 ft/sec, indicative of household ambulator   General Gait Details: pt able to ambulate to door and back with overall steady gait but becoming increasingly more antalgic with distance      Balance Overall balance assessment: Needs assistance Sitting-balance support: No upper extremity supported, Feet supported Sitting balance-Leahy Scale: Fair     Standing balance support: Bilateral upper extremity supported, During functional activity, Reliant on assistive device for balance Standing balance-Leahy Scale: Poor Standing balance comment: dependent on RW for support                             Pertinent Vitals/Pain Pain Assessment Pain Assessment: Faces Faces Pain Scale: Hurts little more Pain Location: R LE and L hip with WB Pain Descriptors / Indicators: Discomfort,  Grimacing, Guarding Pain Intervention(s): Limited activity within patient's tolerance, Monitored during session,  Repositioned    Home Living Family/patient expects to be discharged to:: Skilled nursing facility (pt from [facility] where she was rehabbing from fall with)                        Prior Function Prior Level of Function : Independent/Modified Independent             Mobility Comments: using RW and wheelchair for mobilization at facility ADLs Comments: setup for self care, meals brought to the room     Extremity/Trunk Assessment   Upper Extremity Assessment Upper Extremity Assessment: Defer to OT evaluation    Lower Extremity Assessment Lower Extremity Assessment: Generalized weakness;RLE deficits/detail;LLE deficits/detail RLE Deficits / Details: ROM painful but WFL, strength assessed in mobility to be 3+/5 LLE Deficits / Details: ROM painful but WFL, strength assessed in mobility to be 3+/5    Cervical / Trunk Assessment Cervical / Trunk Assessment: Kyphotic  Communication   Communication Communication: No apparent difficulties    Cognition Arousal: Alert Behavior During Therapy: WFL for tasks assessed/performed   PT - Cognitive impairments: No apparent impairments                         Following commands: Intact       Cueing Cueing Techniques: Verbal cues, Tactile cues, Visual cues     General Comments General comments (skin integrity, edema, etc.): VSS on RA        Assessment/Plan    PT Assessment Patient needs continued PT services  PT Problem List Decreased strength;Decreased range of motion;Decreased activity tolerance;Decreased skin integrity;Pain;Decreased balance;Decreased mobility       PT Treatment Interventions DME instruction;Gait training;Functional mobility training;Therapeutic activities;Therapeutic exercise;Balance training;Cognitive remediation;Patient/family education    PT Goals (Current goals can be found in the Care Plan section)  Acute Rehab PT Goals PT Goal Formulation: With patient Time For Goal  Achievement: 01/23/24 Potential to Achieve Goals: Good    Frequency Min 1X/week     Co-evaluation               AM-PAC PT 6 Clicks Mobility  Outcome Measure Help needed turning from your back to your side while in a flat bed without using bedrails?: A Little Help needed moving from lying on your back to sitting on the side of a flat bed without using bedrails?: A Little Help needed moving to and from a bed to a chair (including a wheelchair)?: A Little Help needed standing up from a chair using your arms (e.g., wheelchair or bedside chair)?: A Little Help needed to walk in hospital room?: A Lot Help needed climbing 3-5 steps with a railing? : Total 6 Click Score: 15    End of Session Equipment Utilized During Treatment: Gait belt Activity Tolerance: Patient tolerated treatment well;Patient limited by fatigue;Patient limited by pain Patient left: in bed;with call bell/phone within reach;with bed alarm set Nurse Communication: Mobility status PT Visit Diagnosis: Muscle weakness (generalized) (M62.81);Difficulty in walking, not elsewhere classified (R26.2);Unsteadiness on feet (R26.81);History of falling (Z91.81);Pain Pain - Right/Left: Right Pain - part of body: Leg    Time: 8779-8758 PT Time Calculation (min) (ACUTE ONLY): 21 min   Charges:   PT Evaluation $PT Eval Moderate Complexity: 1 Mod   PT General Charges $$ ACUTE PT VISIT: 1 Visit         Darria Corvera B. Van Fleet PT,  DPT Acute Rehabilitation Services Please use secure chat or  Call Office [phone]   Almarie KATHEE Salinas [facility] 01/09/2024, 3:53 PM

## 2024-01-09 NOTE — Consult Note (Signed)
 Reason for Consult: Right pretibial wound secondary to fall Referring Physician: Dr. Dennise Claretta Ronde Kathryn [name] is an 89 y.o. female.  HPI: Kathryn Kathryn [name] is a very pleasant 89 year old female who fell approximately 2 weeks ago striking her pretibial region on a metal table.  She sustained a degloving type injury which was managed with absorbable sutures in the emergency department.  She was eventually discharged to a skilled nursing facility where she underwent dressing changes.  She has been unable to ambulate due to pain and she was returned to the hospital for reevaluation of the wound.  We were consulted to assist with management.  Patient has not had any elevation of white count and no evidence of infection.  Tib-fib films did not reveal any fracture.  Past Medical History:  Diagnosis Date   Arthritis    HANDS   Bradycardia    CKD stage 3a, GFR 45-59 ml/min (HCC) 12/24/2023   DVT (deep venous thrombosis) (HCC) 2010   Dysrhythmia    HX OF PAROXYSMAL ATRIAL FIB- TAKES ASPIRIN FOR BLOOD THINNER   GERD (gastroesophageal reflux disease)    Hypertension    Peripheral vascular disease (HCC)    Blood clot behind Right knee   Presence of permanent cardiac pacemaker    COMPLETE HEART BLOCK--DR. CROITORU     Past Surgical History:  Procedure Laterality Date   ABDOMINAL HYSTERECTOMY     APPENDECTOMY     AUGMENTATION MAMMAPLASTY Bilateral    BREAST ENHANCEMENT SURGERY     BREAST IMPLANT REMOVAL Bilateral 08/28/2017   Procedure: REMOVAL BREAST IMPLANTS BILATERAL;  Surgeon: Lowery Estefana RAMAN, DO;  Location: [facility];  Service: Plastics;  Laterality: Bilateral;   CATARACT EXTRACTION, BILATERAL     CHOLECYSTECTOMY     COLONOSCOPY N/A 03/05/2014   Procedure: COLONOSCOPY;  Surgeon: Alm VEAR Angle, MD;  Location: [facility];  Service: General;  Laterality: N/A;   COLOSTOMY CLOSURE N/A 05/21/2014   Procedure: LAPAROSCOPIC ASSISTED CLOSURE OF COLOSTOMY AND REPAIR OF TRANSVERSE  COLON AND ENTEROTOMY;  Surgeon: Alm Angle, MD;  Location: [facility];  Service: General;  Laterality: N/A;   EP IMPLANTABLE DEVICE N/A 10/04/2015   Procedure: PPM/BIV PPM Generator Changeout;  Surgeon: Jerel Balding, MD;  Location: [facility];  Service: Cardiovascular;  Laterality: N/A;   LAPAROTOMY N/A 08/27/2013   Procedure: LAPAROSCOPY CONVERTED TO OPEN LAPAROTOMY WITH SIGMOID COLECTOMY AND COLOSTOMY;  Surgeon: Alm VEAR Angle, MD;  Location: [facility];  Service: General;  Laterality: N/A;   OPEN REDUCTION INTERNAL FIXATION (ORIF) DISTAL RADIAL FRACTURE Left 02/15/2015   Procedure: OPEN TREATMENT OF LEFT DISTAL RADIUS FRACTURE;  Surgeon: Alm Hummer, MD;  Location: [facility];  Service: Orthopedics;  Laterality: Left;   PACEMAKER INSERTION  12/02/1998   medtronic   PERMANENT PACEMAKER GENERATOR CHANGE  02/28/2007   Medtronic Adapta   TONSILLECTOMY     US  ECHOCARDIOGRAPHY  10/09/2011   mild LAE, mild MR,mild to mod TR    Family History  Problem Relation Age of Onset   Heart disease Mother    Heart disease Father    Breast cancer Neg Hx     Social History:  reports that she has never smoked. She has never used smokeless tobacco. She reports that she does not drink alcohol and does not use drugs.  Allergies:  Allergies  Allergen Reactions   Atorvastatin Other (See Comments)    Myalgia   Darvocet [Propoxyphene N-Acetaminophen ] Other (See Comments)    Hallucination  Ketamine  Other (See Comments)    Altered mental status, hallucinations   Valsartan Other (See Comments)    Dizziness, unsteady gait   Acetaminophen -Codeine Other (See Comments)    Reaction type/severity unknown   Codeine Other (See Comments)    Reaction type/severity unknown    Percocet [Oxycodone-Acetaminophen ] Other (See Comments)    Hallucinations    Procaine Other (See Comments)    Reaction type/severity unknown    Epinephrine  Palpitations and Other (See Comments)    Severe  increase in heart rate (has pacemaker)   Ibandronate Other (See Comments)    Difficulty swallowing, concerns about side-effects   Iron  Diarrhea and Nausea Only   Levofloxacin Other (See Comments)    Other reaction(s): shoulder pain. Patient has successfully taken ciprofloxacin   Proton Pump Inhibitors Diarrhea    Medications: Medications reviewed patient is not currently on any blood thinners other than aspirin.  Results for orders placed or performed during the hospital encounter of 01/08/24 (from the past 48 hours)  CBC with Differential     Status: Abnormal   Collection Time: 01/08/24  3:16 PM  Result Value Ref Range   WBC 11.3 (H) 4.0 - 10.5 K/uL   RBC 3.20 (L) 3.87 - 5.11 MIL/uL   Hemoglobin 10.1 (L) 12.0 - 15.0 g/dL   HCT 68.5 (L) 63.9 - 53.9 %   MCV 98.1 80.0 - 100.0 fL   MCH 31.6 26.0 - 34.0 pg   MCHC 32.2 30.0 - 36.0 g/dL   RDW 85.9 88.4 - 84.4 %   Platelets 355 150 - 400 K/uL   nRBC 0.0 0.0 - 0.2 %   Neutrophils Relative % 74 %   Neutro Abs 8.5 (H) 1.7 - 7.7 K/uL   Lymphocytes Relative 9 %   Lymphs Abs 1.0 0.7 - 4.0 K/uL   Monocytes Relative 11 %   Monocytes Absolute 1.3 (H) 0.1 - 1.0 K/uL   Eosinophils Relative 4 %   Eosinophils Absolute 0.4 0.0 - 0.5 K/uL   Basophils Relative 1 %   Basophils Absolute 0.1 0.0 - 0.1 K/uL   Immature Granulocytes 1 %   Abs Immature Granulocytes 0.08 (H) 0.00 - 0.07 K/uL    Comment: Performed at [facility], [address], [place], [place] [address]  Basic metabolic panel     Status: Abnormal   Collection Time: 01/08/24  3:16 PM  Result Value Ref Range   Sodium 135 135 - 145 mmol/L   Potassium 4.1 3.5 - 5.1 mmol/L   Chloride 100 98 - 111 mmol/L   CO2 24 22 - 32 mmol/L   Glucose, Bld 178 (H) 70 - 99 mg/dL    Comment: Glucose reference range applies only to samples taken after fasting for at least 8 hours.   BUN 30 (H) 8 - 23 mg/dL   Creatinine, Ser 8.74 (H) 0.44 - 1.00 mg/dL   Calcium 10.0 8.9 - 10.3 mg/dL   GFR,  Estimated 40 (L) >60 mL/min    Comment: (NOTE) Calculated using the CKD-EPI Creatinine Equation (2021)    Anion gap 11 5 - 15    Comment: Performed at [facility], [address] [place], [place] [address]  Procalcitonin     Status: None   Collection Time: 01/08/24  3:16 PM  Result Value Ref Range   Procalcitonin <0.10 ng/mL    Comment:        Interpretation: PCT (Procalcitonin) <= 0.5 ng/mL: Systemic infection (sepsis) is not likely. Local bacterial  infection is possible. (NOTE)       Sepsis PCT Algorithm           Lower Respiratory Tract                                      Infection PCT Algorithm    ----------------------------     ----------------------------         PCT < 0.25 ng/mL                PCT < 0.10 ng/mL          Strongly encourage             Strongly discourage   discontinuation of antibiotics    initiation of antibiotics    ----------------------------     -----------------------------       PCT 0.25 - 0.50 ng/mL            PCT 0.10 - 0.25 ng/mL               OR       >80% decrease in PCT            Discourage initiation of                                            antibiotics      Encourage discontinuation           of antibiotics    ----------------------------     -----------------------------         PCT >= 0.50 ng/mL              PCT 0.26 - 0.50 ng/mL               AND        <80% decrease in PCT             Encourage initiation of                                             antibiotics       Encourage continuation           of antibiotics    ----------------------------     -----------------------------        PCT >= 0.50 ng/mL                  PCT > 0.50 ng/mL               AND         increase in PCT                  Strongly encourage                                      initiation of antibiotics    Strongly encourage escalation           of antibiotics                                     -----------------------------  PCT <= 0.25 ng/mL                                                 OR                                        > 80% decrease in PCT                                      Discontinue / Do not initiate                                             antibiotics  Performed at [facility], [address] [place], [place] [address]   Culture, blood (Routine X 2) w Reflex to ID Panel     Status: None (Preliminary result)   Collection Time: 01/09/24  3:45 AM   Specimen: BLOOD RIGHT HAND  Result Value Ref Range   Specimen Description BLOOD RIGHT HAND    Special Requests      BOTTLES DRAWN AEROBIC AND ANAEROBIC Blood Culture results may not be optimal due to an inadequate volume of blood received in culture bottles   Culture      NO GROWTH < 12 HOURS Performed at [facility], [address] [place], [place] [address]    Report Status PENDING   Magnesium     Status: None   Collection Time: 01/09/24  3:45 AM  Result Value Ref Range   Magnesium 1.8 1.7 - 2.4 mg/dL    Comment: Performed at [facility], [address], [place], [place] [address]  CBC with Differential/Platelet     Status: Abnormal   Collection Time: 01/09/24  3:45 AM  Result Value Ref Range   WBC 9.6 4.0 - 10.5 K/uL   RBC 2.89 (L) 3.87 - 5.11 MIL/uL   Hemoglobin 8.9 (L) 12.0 - 15.0 g/dL   HCT 72.3 (L) 63.9 - 53.9 %   MCV 95.5 80.0 - 100.0 fL   MCH 30.8 26.0 - 34.0 pg   MCHC 32.2 30.0 - 36.0 g/dL   RDW 86.0 88.4 - 84.4 %   Platelets 317 150 - 400 K/uL   nRBC 0.0 0.0 - 0.2 %   Neutrophils Relative % 73 %   Neutro Abs 7.0 1.7 - 7.7 K/uL   Lymphocytes Relative 9 %   Lymphs Abs 0.9 0.7 - 4.0 K/uL   Monocytes Relative 11 %   Monocytes Absolute 1.1 (H) 0.1 - 1.0 K/uL   Eosinophils Relative 6 %   Eosinophils Absolute 0.5 0.0 - 0.5 K/uL   Basophils Relative 1 %   Basophils Absolute 0.1 0.0 - 0.1 K/uL   Immature Granulocytes 0 %   Abs Immature Granulocytes 0.04 0.00 - 0.07 K/uL     Comment: Performed at [facility], [address], [place], [place] [address]  Procalcitonin     Status: None   Collection Time: 01/09/24  3:45 AM  Result Value Ref Range   Procalcitonin <0.10 ng/mL  Comment:        Interpretation: PCT (Procalcitonin) <= 0.5 ng/mL: Systemic infection (sepsis) is not likely. Local bacterial infection is possible. (NOTE)       Sepsis PCT Algorithm           Lower Respiratory Tract                                      Infection PCT Algorithm    ----------------------------     ----------------------------         PCT < 0.25 ng/mL                PCT < 0.10 ng/mL          Strongly encourage             Strongly discourage   discontinuation of antibiotics    initiation of antibiotics    ----------------------------     -----------------------------       PCT 0.25 - 0.50 ng/mL            PCT 0.10 - 0.25 ng/mL               OR       >80% decrease in PCT            Discourage initiation of                                            antibiotics      Encourage discontinuation           of antibiotics    ----------------------------     -----------------------------         PCT >= 0.50 ng/mL              PCT 0.26 - 0.50 ng/mL               AND        <80% decrease in PCT             Encourage initiation of                                             antibiotics       Encourage continuation           of antibiotics    ----------------------------     -----------------------------        PCT >= 0.50 ng/mL                  PCT > 0.50 ng/mL               AND         increase in PCT                  Strongly encourage                                      initiation of antibiotics    Strongly encourage escalation           of antibiotics                                     -----------------------------  PCT <= 0.25 ng/mL                                                 OR                                         > 80% decrease in PCT                                      Discontinue / Do not initiate                                             antibiotics  Performed at [facility], [address], [place], [place] [address]   C-reactive protein     Status: Abnormal   Collection Time: 01/09/24  3:45 AM  Result Value Ref Range   CRP 3.8 (H) <1.0 mg/dL    Comment: Performed at [facility], [address], [place], [place] [address]  Basic metabolic panel with GFR     Status: Abnormal   Collection Time: 01/09/24  3:45 AM  Result Value Ref Range   Sodium 135 135 - 145 mmol/L   Potassium 4.0 3.5 - 5.1 mmol/L   Chloride 101 98 - 111 mmol/L   CO2 22 22 - 32 mmol/L   Glucose, Bld 100 (H) 70 - 99 mg/dL    Comment: Glucose reference range applies only to samples taken after fasting for at least 8 hours.   BUN 28 (H) 8 - 23 mg/dL   Creatinine, Ser 8.74 (H) 0.44 - 1.00 mg/dL   Calcium 9.5 8.9 - 10.3 mg/dL   GFR, Estimated 40 (L) >60 mL/min    Comment: (NOTE) Calculated using the CKD-EPI Creatinine Equation (2021)    Anion gap 12 5 - 15    Comment: Performed at [facility], [address] [place], [place] [address]  Type and screen     Status: None (Preliminary result)   Collection Time: 01/09/24  3:45 AM  Result Value Ref Range   ABO/RH(D) B NEG    Antibody Screen POS    Sample Expiration 01/12/2024,2359    Antibody Identification      ANTI D Performed at [facility], [address] [place], [place] [address]    Unit Number [id number]    Blood Component Type RBC LR PHER2    Unit division 00    Status of Unit ALLOCATED    Transfusion Status OK TO TRANSFUSE    Crossmatch Result COMPATIBLE    Unit Number [id number]    Blood Component Type RED CELLS,LR    Unit division 00    Status of Unit ALLOCATED    Transfusion Status OK TO TRANSFUSE    Crossmatch Result COMPATIBLE   Protime-INR     Status: Abnormal   Collection Time: 01/09/24  3:45 AM  Result Value  Ref Range   Prothrombin Time 15.9 (H) 11.4 - 15.2 seconds   INR 1.2 0.8 - 1.2    Comment: (NOTE)  INR goal varies based on device and disease states. Performed at [facility], [address] [place], [place] [address]   Culture, blood (Routine X 2) w Reflex to ID Panel     Status: None (Preliminary result)   Collection Time: 01/09/24  3:51 AM   Specimen: BLOOD RIGHT ARM  Result Value Ref Range   Specimen Description BLOOD RIGHT ARM    Special Requests      BOTTLES DRAWN AEROBIC AND ANAEROBIC Blood Culture adequate volume   Culture      NO GROWTH < 12 HOURS Performed at [facility], [address], [place], [place] [address]    Report Status PENDING     DG Chest Port 1 View Result Date: 01/08/2024 CLINICAL DATA:  Shortness of breath. EXAM: PORTABLE CHEST 1 VIEW COMPARISON:  Chest radiograph dated 12/24/2023. FINDINGS: Trace bilateral pleural effusions. No consolidative changes. There is no pleural effusion pneumothorax. Background of emphysema and chronic interstitial coarsening. Mild cardiomegaly. Right pectoral pacemaker device. Osteopenia with degenerative changes. No acute osseous pathology. IMPRESSION: Trace bilateral pleural effusions. No consolidative changes. Electronically Signed   By: Vanetta Chou M.D.   On: 01/08/2024 17:32    ROS Blood pressure (!) 182/129, pulse 70, temperature (!) 97.5 F (36.4 C), resp. rate (!) 22, height 5' 4 (1.626 m), weight 50.3 kg, SpO2 100%. Physical Exam Right leg: Dressings in place.  Dressings removed patient has a superficial from the right pretibial region wound is approximately 15 cm x 8 cm.  There is ecchymoses surrounding the entire wound.  The tissue is completely intact over the bone.  There is no evidence of drainage from the wound  Assessment/Plan: Right pretibial wound: Patient has a significant superficial wound but this does not extend down to the bone.  I do not believe that she would benefit from debridement and  placement of a tissue matrix.  She certainly does not have a wound that would require transfer for free tissue reconstruction.  Would recommend continued dressing changes.  Will place orders for new dressings to include Adaptic covered by a Vashe moistened gauze covered by an ABD pad and held in place with Kerlix.  This dressing should be changed twice daily.  Will reevaluate patient on Monday if she is still in the hospital.  If she is discharged we will have her follow-up in my office next week.  Leonce KATHEE Birmingham 01/09/2024, 12:01 PM

## 2024-01-09 NOTE — TOC Initial Note (Signed)
 Transition of Care [facility]) - Initial/Assessment Note    Patient Details  Name: Kathryn [name] MRN: [id number] Date of Birth: 10-30-1927  Transition of [facility]) CM/SW Contact:    Kacey Dysert A [place], LCSW Phone Number: 01/09/2024, 12:22 PM  Clinical Narrative:                  CSW met with pt at bedside to complete assessment. She said that she was from [place] for short term rehab and would like to go back if able. She was from home alone before going to [facility] CSW to reach out to [place] at facility to determine if pt is in co-pay days and bed availability.    TOC will continue to follow.   Expected Discharge Plan: Skilled Nursing Facility Barriers to Discharge: English as a second language teacher, Continued Medical Work up   Patient Goals and CMS Choice            Expected Discharge Plan and Services       Living arrangements for the past 2 months: Skilled Nursing Facility, Single Family Home                                      Prior Living Arrangements/Services Living arrangements for the past 2 months: Skilled Nursing Facility, Single Family Home Lives with:: Self                   Activities of Daily Living   ADL Screening (condition at time of admission) Independently performs ADLs?: Yes (appropriate for developmental age) Is the patient deaf or have difficulty hearing?: No Does the patient have difficulty seeing, even when wearing glasses/contacts?: Yes Does the patient have difficulty concentrating, remembering, or making decisions?: No  Permission Sought/Granted                  Emotional Assessment   Attitude/Demeanor/Rapport: Engaged Affect (typically observed): Pleasant Orientation: : Oriented to Self, Oriented to Place, Oriented to  Time, Oriented to Situation Alcohol / Substance Use: Not Applicable Psych Involvement: No (comment)  Admission diagnosis:  Cellulitis of right lower extremity [L03.115] Laceration of muscle(s) and  tendon(s) of peroneal muscle group at lower leg level, right leg, subsequent encounter [S86.321D] Patient Active Problem List   Diagnosis Date Noted   Laceration of muscle(s) and tendon(s) of peroneal muscle group at lower leg level, right leg, subsequent encounter 01/08/2024   Acute on chronic diastolic HF (heart failure) (HCC) 12/25/2023   Acute on chronic diastolic CHF (congestive heart failure) (HCC) 12/24/2023   Laceration of right leg excluding thigh, initial encounter 12/24/2023   CKD stage 3a, GFR 45-59 ml/min (HCC) 12/24/2023   Ruptured silicone breast implant 08/28/2017   Normal coronary arteries 07/04/2017   Pacemaker battery depletion 09/14/2015   S/P colostomy takedown 05/21/2014   Diverticulitis of colon (without mention of hemorrhage)(562.11) 01/27/2014   Colostomy in place [facility]) 01/27/2014   Paroxysmal atrial fibrillation (HCC) 01/19/2014   Complete heart block (HCC) 01/19/2014   Pacemaker - dual-chamber Medtronic 01/19/2014   Preop cardiovascular exam 01/19/2014   HTN (hypertension) 01/19/2014   PCP:  Shayne Anes, MD Pharmacy:   [facility] - [place], [place] - [address] [place] [address] Phone: [phone] Fax: [phone]     Social Drivers of Health (SDOH) Social History: SDOH Screenings   Food Insecurity: No Food Insecurity (01/09/2024)  Housing: Low Risk  (  01/09/2024)  Transportation Needs: No Transportation Needs (01/09/2024)  Utilities: Not At Risk (01/09/2024)  Social Connections: Patient Declined (01/09/2024)  Tobacco Use: Low Risk  (01/08/2024)   SDOH Interventions:     Readmission Risk Interventions    01/09/2024   12:20 PM  Readmission Risk Prevention Plan  Transportation Screening Complete  PCP or Specialist Appt within 5-7 Days Complete  Home Care Screening Complete  Medication Review (RN CM) Complete

## 2024-01-10 DIAGNOSIS — S86321D Laceration of muscle(s) and tendon(s) of peroneal muscle group at lower leg level, right leg, subsequent encounter: Secondary | ICD-10-CM | POA: Diagnosis not present

## 2024-01-10 LAB — CBC WITH DIFFERENTIAL/PLATELET
Abs Immature Granulocytes: 0.03 K/uL (ref 0.00–0.07)
Basophils Absolute: 0.1 K/uL (ref 0.0–0.1)
Basophils Relative: 1 %
Eosinophils Absolute: 0.5 K/uL (ref 0.0–0.5)
Eosinophils Relative: 6 %
HCT: 25.7 % — ABNORMAL LOW (ref 36.0–46.0)
Hemoglobin: 8.5 g/dL — ABNORMAL LOW (ref 12.0–15.0)
Immature Granulocytes: 0 %
Lymphocytes Relative: 15 %
Lymphs Abs: 1.3 K/uL (ref 0.7–4.0)
MCH: 31.3 pg (ref 26.0–34.0)
MCHC: 33.1 g/dL (ref 30.0–36.0)
MCV: 94.5 fL (ref 80.0–100.0)
Monocytes Absolute: 0.9 K/uL (ref 0.1–1.0)
Monocytes Relative: 11 %
Neutro Abs: 5.4 K/uL (ref 1.7–7.7)
Neutrophils Relative %: 67 %
Platelets: 318 K/uL (ref 150–400)
RBC: 2.72 MIL/uL — ABNORMAL LOW (ref 3.87–5.11)
RDW: 13.9 % (ref 11.5–15.5)
WBC: 8.2 K/uL (ref 4.0–10.5)
nRBC: 0 % (ref 0.0–0.2)

## 2024-01-10 LAB — BASIC METABOLIC PANEL WITH GFR
Anion gap: 10 (ref 5–15)
BUN: 32 mg/dL — ABNORMAL HIGH (ref 8–23)
CO2: 23 mmol/L (ref 22–32)
Calcium: 8.8 mg/dL — ABNORMAL LOW (ref 8.9–10.3)
Chloride: 103 mmol/L (ref 98–111)
Creatinine, Ser: 1.44 mg/dL — ABNORMAL HIGH (ref 0.44–1.00)
GFR, Estimated: 34 mL/min — ABNORMAL LOW (ref >60)
Glucose, Bld: 94 mg/dL (ref 70–99)
Potassium: 3.7 mmol/L (ref 3.5–5.1)
Sodium: 136 mmol/L (ref 135–145)

## 2024-01-10 LAB — PROCALCITONIN: Procalcitonin: <0.1 ng/mL

## 2024-01-10 LAB — C-REACTIVE PROTEIN: CRP: 4.2 mg/dL — ABNORMAL HIGH (ref <1.0)

## 2024-01-10 LAB — MAGNESIUM: Magnesium: 1.7 mg/dL (ref 1.7–2.4)

## 2024-01-10 MED ORDER — DOXYCYCLINE HYCLATE 100 MG PO TABS: 100.0000 mg | ORAL_TABLET | Freq: Two times a day (BID) | ORAL | 0 refills | Status: DC

## 2024-01-10 NOTE — Plan of Care (Signed)

## 2024-01-10 NOTE — Discharge Instructions (Signed)
 Wound care: Adaptic covered by a Vashe moistened gauze covered by an ABD pad and held in place with Kerlix. This dressing should be changed twice daily.

## 2024-01-10 NOTE — Evaluation (Signed)
 Occupational Therapy Evaluation Patient Details Name: Nava Song O'Daniel MRN: [id number] DOB: 07-28-1927 Today's Date: 01/10/2024   History of Present Illness   Pt is a 89 y.o. female admitted 7/2 from [facility] for non healing R LE wound with pain with weightbearing PMH: hospitalized 6/17 for fall, laceration of right leg, and CHF exacerbation, pacemaker, PVD, HTN, DVT, Takotsubo cardiomyopathy, atrial fibrillation not anticoagulated, chronic diastolic CHF     Clinical Impressions Pt presents to [facility] for [facility] SNF where she was receiving short-term rehab following recent hospital stay. At baseline, pt is Independent with ADLs, IADLs, and functional mobility, and drives. Just prior to this admission, at [facility], pt was requiring Set up for ADLs and assist with IADLs, and was performing functional mobility with a RW versus a manual w/c. Pt now present with decreased balance, decreased activity tolerance, and decreased safety and independence with functional tasks. Pt currently largely performing ADLs with Mod I to Contact guard assist and functional transfers/mobility with a RW with Contact guard assist. Plan for pt to return to [facility] later this day to complete short-term intensive inpatient skilled rehab services < 3 hours per day. If pt does not discharge as planned, pt will benefit from acute skilled OT services to address deficits outlined below and increase safety and independence with functional tasks.      If plan is discharge home, recommend the following:   A little help with walking and/or transfers;A little help with bathing/dressing/bathroom;Assistance with cooking/housework;Help with stairs or ramp for entrance;Supervision due to cognitive status     Functional Status Assessment   Patient has had a recent decline in their functional status and demonstrates the ability to make significant improvements in function in a reasonable and predictable amount of time.      Equipment Recommendations   Other (comment) (Defer to next level of care)     Recommendations for Other Services         Precautions/Restrictions   Precautions Precautions: Fall Recall of Precautions/Restrictions: Intact Restrictions Weight Bearing Restrictions Per Provider Order: No     Mobility Bed Mobility Overal bed mobility: Needs Assistance Bed Mobility: Supine to Sit     Supine to sit: Modified independent (Device/Increase time), HOB elevated          Transfers Overall transfer level: Needs assistance Equipment used: Rolling walker (2 wheels) Transfers: Sit to/from Stand, Bed to chair/wheelchair/BSC Sit to Stand: Contact guard assist     Step pivot transfers: Contact guard assist     General transfer comment: CGA for safety, no physical assist needed      Balance Overall balance assessment: Needs assistance Sitting-balance support: No upper extremity supported, Feet supported Sitting balance-Leahy Scale: Good     Standing balance support: Bilateral upper extremity supported, During functional activity, Reliant on assistive device for balance, Single extremity supported Standing balance-Leahy Scale: Poor Standing balance comment: dependent on RW for support                           ADL either performed or assessed with clinical judgement   ADL Overall ADL's : Needs assistance/impaired Eating/Feeding: Sitting;Modified independent   Grooming: Set up;Sitting   Upper Body Bathing: Set up;Sitting   Lower Body Bathing: Contact guard assist;Sit to/from stand;Sitting/lateral leans   Upper Body Dressing : Set up;Sitting   Lower Body Dressing: Contact guard assist;Sit to/from stand;Sitting/lateral leans   Toilet Transfer: Contact guard assist;Ambulation;Rolling walker (2 wheels);BSC/3in1   Toileting- Clothing Manipulation  and Hygiene: Contact guard assist;Sit to/from stand       Functional mobility during ADLs: Contact guard  assist;Rolling walker (2 wheels)       Vision Ability to See in Adequate Light: 0 Adequate Patient Visual Report: No change from baseline       Perception         Praxis         Pertinent Vitals/Pain Pain Assessment Pain Assessment: Faces Faces Pain Scale: Hurts a little bit Pain Location: R LE and L hip with WB Pain Descriptors / Indicators: Discomfort, Grimacing, Guarding Pain Intervention(s): Limited activity within patient's tolerance, Monitored during session, Repositioned     Extremity/Trunk Assessment Upper Extremity Assessment Upper Extremity Assessment: Right hand dominant;Overall WFL for tasks assessed (gross B UE strength 4/5)   Lower Extremity Assessment Lower Extremity Assessment: Defer to PT evaluation   Cervical / Trunk Assessment Cervical / Trunk Assessment: Kyphotic   Communication Communication Communication: No apparent difficulties   Cognition Arousal: Alert Behavior During Therapy: WFL for tasks assessed/performed Cognition: No apparent impairments             OT - Cognition Comments: Pt AAOx4 and pleasant throughout session. Cognition WFL for tasks assessed.                 Following commands: Intact       Cueing  General Comments   Cueing Techniques: Verbal cues;Tactile cues;Visual cues  VSS on RA   Exercises     Shoulder Instructions      Home Living Family/patient expects to be discharged to:: Skilled nursing facility                                 Additional Comments: Pt presetns from [facility] where she was receiving short-term rehab following hospitalization in June 2025. Plan for pt to return to [facility] to complete short-term rehab.      Prior Functioning/Environment Prior Level of Function : Independent/Modified Independent             Mobility Comments: using RW and wheelchair for mobilization at facility ADLs Comments: setup for self care, meals brought to the room    OT  Problem List: Decreased activity tolerance;Impaired balance (sitting and/or standing)   OT Treatment/Interventions: Self-care/ADL training;Therapeutic exercise;Energy conservation;DME and/or AE instruction;Therapeutic activities;Patient/family education;Balance training      OT Goals(Current goals can be found in the care plan section)   Acute Rehab OT Goals Patient Stated Goal: to complete short-term rehab, return home, and play her piano again OT Goal Formulation: With patient Time For Goal Achievement: 01/24/24 Potential to Achieve Goals: Good ADL Goals Pt Will Perform Grooming: with modified independence;standing Pt Will Perform Lower Body Bathing: with modified independence;sitting/lateral leans;sit to/from stand Pt Will Perform Lower Body Dressing: with modified independence;sitting/lateral leans;sit to/from stand Pt Will Transfer to Toilet: with modified independence;ambulating;regular height toilet (with least restrictive AD)   OT Frequency:  Min 1X/week    Co-evaluation              AM-PAC OT 6 Clicks Daily Activity     Outcome Measure Help from another person eating meals?: None Help from another person taking care of personal grooming?: A Little Help from another person toileting, which includes using toliet, bedpan, or urinal?: A Little Help from another person bathing (including washing, rinsing, drying)?: A Little Help from another person to put on and taking off regular upper body  clothing?: A Little Help from another person to put on and taking off regular lower body clothing?: A Little 6 Click Score: 19   End of Session Equipment Utilized During Treatment: Rolling walker (2 wheels);Gait belt;Other (comment) [facility]) Nurse Communication: Mobility status;Other (comment) (pt sitting EOB with alarm on eating lunch)  Activity Tolerance: Patient tolerated treatment well Patient left: in bed;with call bell/phone within reach;with bed alarm set;Other (comment)  (sitting EOB to eat lunch)  OT Visit Diagnosis: Unsteadiness on feet (R26.81);Other abnormalities of gait and mobility (R26.89)                Time: 8786-8765 OT Time Calculation (min): 21 min Charges:  OT General Charges $OT Visit: 1 Visit OT Evaluation $OT Eval Low Complexity: 1 Low  Margarie Rockey HERO., OTR/L, MA Acute Rehab [phone]   Margarie FORBES Horns 01/10/2024, 1:06 PM

## 2024-01-10 NOTE — Discharge Summary (Addendum)
 Physician Discharge Summary   Patient: Kathryn [name] MRN: [id number] DOB: [birth year]  Admit date:     01/08/2024  Discharge date: 01/10/24  Discharge Physician: Deliliah Room   PCP: Shayne Anes, MD   Recommendations at discharge:    Follow up with your PCP in one week. Follow up with plastic surgery in one week. Call to make an appointment.  As per patient ,she is not taking aldactone  anymore because of dizziness.   Discharge Diagnoses: Principal Problem:   Laceration of muscle(s) and tendon(s) of peroneal muscle group at lower leg level, right leg, subsequent encounter Active Problems:   Paroxysmal atrial fibrillation (HCC)   HTN (hypertension)   Diverticulitis of colon (without mention of hemorrhage)(562.11)   Laceration of right leg excluding thigh, initial encounter   CKD stage 3a, GFR 45-59 ml/min [facility])   Hospital Course:   89 y.o. female with history of Takotsubo cardiomyopathy, CKD 3A baseline creatinine around 1.3, paroxysmal A-fib not on anticoagulation except on aspirin , chronic diastolic CHF EF 60% on recent echocardiogram done in March 2025, heart block with PPM presented to the ED with nonhealing large right leg laceration wound after a fall she incurred about 2 to 3 weeks prior to admission.  Patient was seen by Dr Waddell, Plastic surgeon. No surgical intervention required. Dressing changes recommendations are: Adaptic covered by a Vashe moistened gauze covered by an ABD pad and held in place with Kerlix. This dressing should be changed twice daily.  Out patient follow up with Dr Leonce Waddell in 1 week. Oral doxycycline  100 mg bid x 5 days.       Consultants: Plastic surgery Procedures performed: None  Disposition: Skilled nursing facility Diet recommendation:  Cardiac diet DISCHARGE MEDICATION: Allergies as of 01/10/2024       Reactions   Atorvastatin Other (See Comments)   Myalgia   Darvocet [propoxyphene N-acetaminophen ] Other (See Comments)    Hallucination   Ketamine  Other (See Comments)   Altered mental status, hallucinations   Valsartan Other (See Comments)   Dizziness, unsteady gait   Acetaminophen -codeine Other (See Comments)   Reaction type/severity unknown   Codeine Other (See Comments)   Reaction type/severity unknown   Percocet [oxycodone-acetaminophen ] Other (See Comments)   Hallucinations   Procaine Other (See Comments)   Reaction type/severity unknown   Epinephrine  Palpitations, Other (See Comments)   Severe increase in heart rate (has pacemaker)   Ibandronate Other (See Comments)   Difficulty swallowing, concerns about side-effects   Iron  Diarrhea, Nausea Only   Levofloxacin Other (See Comments)   Other reaction(s): shoulder pain. Patient has successfully taken ciprofloxacin   Proton Pump Inhibitors Diarrhea        Medication List     TAKE these medications    acetaminophen  500 MG tablet Commonly known as: TYLENOL  Take 1 tablet (500 mg total) by mouth every 6 (six) hours as needed. What changed: reasons to take this   aspirin  EC 81 MG tablet Take 81 mg by mouth every morning.   carvedilol  12.5 MG tablet Commonly known as: COREG  Take 1 tablet (12.5 mg total) by mouth 2 (two) times daily.   cholecalciferol  1000 units tablet Commonly known as: VITAMIN D  Take 1,000 Units by mouth daily.   CRANBERRY PO Take 1 capsule by mouth daily in the afternoon.   doxycycline  100 MG tablet Commonly known as: VIBRA -TABS Take 1 tablet (100 mg total) by mouth 2 (two) times daily.   EVENING PRIMROSE OIL PO Take 1 capsule by mouth every evening.  furosemide  20 MG tablet Commonly known as: LASIX  Take 1 tablet (20 mg total) by mouth daily.   ondansetron  4 MG disintegrating tablet Commonly known as: ZOFRAN -ODT Take 4 mg by mouth every 8 (eight) hours as needed for nausea or vomiting.   spironolactone  25 MG tablet Commonly known as: Aldactone  Take 0.5 tablets (12.5 mg total) by mouth daily.   Super  Calcium 1500 (600 Ca) MG Tabs tablet Generic drug: calcium carbonate Take 1,500 mg by mouth daily with breakfast.   SYSTANE OP Place 1 drop into both eyes daily as needed (dry eyes).   TART CHERRY PO Take 3 capsules by mouth daily in the afternoon.   traMADol  50 MG tablet Commonly known as: ULTRAM  Take 1 tablet (50 mg total) by mouth every 8 (eight) hours as needed for moderate pain (pain score 4-6) (mild pain).        Contact information for follow-up providers     Shayne Anes, MD Follow up.   Specialty: Internal Medicine Contact information: [address] [place] [address] [phone]         Waddell Leonce NOVAK, MD Follow up in 1 week(s).   Specialty: Plastic Surgery Contact information: [address] [place] [address] [phone]              Contact information for after-discharge care     Destination     [facility] .   Service: Skilled Nursing Contact information: [address] [place]  [address] [phone]                    Discharge Exam: Filed Weights   01/08/24 1450  Weight: 50.3 kg   Constitutional: NAD, calm, comfortable Eyes: PERRL, lids and conjunctivae normal ENMT: Mucous membranes are moist. Posterior pharynx clear of any exudate or lesions.Normal dentition.  Neck: normal, supple, no masses, no thyromegaly Respiratory: clear to auscultation bilaterally, no wheezing, no crackles. Normal respiratory effort. No accessory muscle use.  Cardiovascular: Regular rate and rhythm, no murmurs / rubs / gallops. No extremity edema. 2+ pedal pulses. No carotid bruits.  Abdomen: no tenderness, no masses palpated. No hepatosplenomegaly. Bowel sounds positive.  Musculoskeletal: no clubbing / cyanosis. No joint deformity upper and lower extremities. Good ROM, no contractures. Normal muscle tone.  Skin: superficial  right pretibial region wound with ecchymoses surrounding the entire wound. The tissue is  completely intact over the bone. There is no evidence of drainage from the wound  Neurologic: CN 2-12 grossly intact. Sensation intact, DTR normal. Strength 5/5 x all 4 extremities.  Psychiatric: Normal judgment and insight. Alert and oriented x 3. Normal mood.    Condition at discharge: good  The results of significant diagnostics from this hospitalization (including imaging, microbiology, ancillary and laboratory) are listed below for reference.   Imaging Studies: DG Chest Port 1 View Result Date: 01/08/2024 CLINICAL DATA:  Shortness of breath. EXAM: PORTABLE CHEST 1 VIEW COMPARISON:  Chest radiograph dated 12/24/2023. FINDINGS: Trace bilateral pleural effusions. No consolidative changes. There is no pleural effusion pneumothorax. Background of emphysema and chronic interstitial coarsening. Mild cardiomegaly. Right pectoral pacemaker device. Osteopenia with degenerative changes. No acute osseous pathology. IMPRESSION: Trace bilateral pleural effusions. No consolidative changes. Electronically Signed   By: Vanetta Chou M.D.   On: 01/08/2024 17:32   DG Chest 2 View Result Date: 12/24/2023 EXAM: 2 VIEW(S) XRAY OF THE CHEST 12/24/2023 03:03:35 AM COMPARISON: 08/27/2013 CLINICAL HISTORY: Pleural effusion on CT. SOB. Upper chest: Right pleural effusion  on CT neck post fall. FINDINGS: LUNGS AND PLEURA: Small bilateral pleural effusions, left greater than right. Chronic right infrahilar opacity, likely scarring. HEART AND MEDIASTINUM: No acute abnormality of the cardiac and mediastinal silhouettes. BONES AND SOFT TISSUES: No acute osseous abnormality. Right subclavian pacemaker. IMPRESSION: 1. Small bilateral pleural effusions, left greater than right. 2. Chronic right infrahilar opacity, likely scarring. Electronically signed by: Pinkie Pebbles MD 12/24/2023 03:08 AM EDT RP Workstation: [id number]   CT Tibia Fibula Right Wo Contrast Result Date: 12/24/2023 EXAM: CT RIGHT LOWER EXTREMITY, WITHOUT IV  CONTRAST 12/24/2023 01:54:43 AM TECHNIQUE: Axial images were acquired through the right lower extremity without IV contrast. Reformatted images were reviewed. Automated exposure control, iterative reconstruction, and/or weight based adjustment of the mA/kV was utilized to reduce the radiation dose to as low as reasonably achievable. COMPARISON: None available. CLINICAL HISTORY: Lower leg trauma. S from home for mechanical fall. No thinners, No LOC. Did not hit head. Large skin tear to R leg. VSS. FINDINGS: BONES AND JOINTS: No fracture or dislocation. Mild tricompartmental degenerative changes in the knee with chondrocalcinosis in the medial and lateral compartments. SOFT TISSUES: Soft tissue laceration along the anterolateral aspect of the mid/distal tibia. No radiopaque foreign body is seen. IMPRESSION: 1. Soft tissue laceration along the anterolateral aspect of the mid/distal tibia without radiopaque foreign body. 2. No fracture or dislocation. Electronically signed by: Pinkie Pebbles MD 12/24/2023 02:02 AM EDT RP Workstation: [id number]   CT PELVIS WO CONTRAST Result Date: 12/24/2023 EXAM: CT PELVIS, WITHOUT IV CONTRAST 12/24/2023 01:54:43 AM TECHNIQUE: Axial images were acquired through the pelvis without IV contrast. Reformatted images were reviewed. Automated exposure control, iterative reconstruction, and/or weight based adjustment of the mA/kV was utilized to reduce the radiation dose to as low as reasonably achievable. COMPARISON: None available. CLINICAL HISTORY: Hip trauma, fracture suspected, no prior imaging. S from home for mechanical fall. No thinners, No LOC. Did not hit head. Large skin tear to R leg. VSS. FINDINGS: BONES: Bony pelvis and bilateral proximal femurs are intact. No acute fracture or focal osseous lesion. JOINTS: Mild degenerative changes of the bilateral hips. No dislocation. SOFT TISSUES: Status post left hemicolectomy with suture line in the lower pelvis (image 56). 2  nonobstructing right lower pole renal calculi measuring 4 mm (image 5). Status post hysterectomy. Atherosclerotic calcification of the abdominal aorta and bilateral iliacs. IMPRESSION: 1. No traumatic injury to the pelvis. Electronically signed by: Pinkie Pebbles MD 12/24/2023 02:00 AM EDT RP Workstation: [id number]   CT Cervical Spine Wo Contrast Result Date: 12/24/2023 CLINICAL DATA:  Neck trauma, intoxicated or obtunded (Age >= 16y). Fall EXAM: CT CERVICAL SPINE WITHOUT CONTRAST TECHNIQUE: Multidetector CT imaging of the cervical spine was performed without intravenous contrast. Multiplanar CT image reconstructions were also generated. RADIATION DOSE REDUCTION: This exam was performed according to the departmental dose-optimization program which includes automated exposure control, adjustment of the mA and/or kV according to patient size and/or use of iterative reconstruction technique. COMPARISON:  None Available. FINDINGS: Alignment: Normal. Skull base and vertebrae: Multilevel moderate severe degenerative changes spine. Moderate severe osseous neural a stenosis of the right C5-C6 level. No acute fracture. No aggressive appearing focal osseous lesion or focal pathologic process. Soft tissues and spinal canal: No prevertebral fluid or swelling. No visible canal hematoma. Upper chest: Right pleural effusion. Biapical pleural/pulmonary scarring. Other: None. IMPRESSION: 1. No acute displaced fracture or traumatic listhesis of the cervical spine. 2. Right pleural effusion. Recommend further evaluation with chest x-ray PA and lateral view.  Electronically Signed   By: Morgane  Naveau M.D.   On: 12/24/2023 01:58   CT Head Wo Contrast Result Date: 12/23/2023 CLINICAL DATA:  Fall with head trauma EXAM: CT HEAD WITHOUT CONTRAST TECHNIQUE: Contiguous axial images were obtained from the base of the skull through the vertex without intravenous contrast. RADIATION DOSE REDUCTION: This exam was performed according to  the departmental dose-optimization program which includes automated exposure control, adjustment of the mA and/or kV according to patient size and/or use of iterative reconstruction technique. COMPARISON:  CT brain 07/11/2006 FINDINGS: Brain: No acute territorial infarction, hemorrhage or intracranial mass. Patchy white matter hypodensity. Mild atrophy. Prominent ventricles likely related to atrophy Vascular: No hyperdense vessels.  Carotid vascular calcification Skull: Normal. Negative for fracture or focal lesion. Sinuses/Orbits: No acute finding. Other: None IMPRESSION: 1. No CT evidence for acute intracranial abnormality. 2. Atrophy and chronic small vessel ischemic changes of the white matter. Electronically Signed   By: Luke Bun M.D.   On: 12/23/2023 21:59   DG Tibia/Fibula Right Result Date: 12/23/2023 CLINICAL DATA:  [facility] EXAM: RIGHT TIBIA AND FIBULA - 2 VIEW COMPARISON:  None Available. FINDINGS: Frontal and lateral views of the right tibia and fibula are obtained. There are no acute displaced fractures. Alignment of the right knee and ankle is anatomic. Moderate right knee osteoarthritis with chondrocalcinosis of the medial and lateral compartments. There is diffuse soft tissue swelling throughout the right lower extremity. No radiopaque foreign body. IMPRESSION: 1. Diffuse soft tissue swelling.  No acute fracture. 2. Moderate right knee osteoarthritis. Electronically Signed   By: Ozell Daring M.D.   On: 12/23/2023 21:26    Microbiology: Results for orders placed or performed during the hospital encounter of 01/08/24  Culture, blood (Routine X 2) w Reflex to ID Panel     Status: None (Preliminary result)   Collection Time: 01/09/24  3:45 AM   Specimen: BLOOD RIGHT HAND  Result Value Ref Range Status   Specimen Description BLOOD RIGHT HAND  Final   Special Requests   Final    BOTTLES DRAWN AEROBIC AND ANAEROBIC Blood Culture results may not be optimal due to an inadequate volume of  blood received in culture bottles   Culture   Final    NO GROWTH 1 DAY Performed at [facility], [address] [place], [place] [address]    Report Status PENDING  Incomplete  Culture, blood (Routine X 2) w Reflex to ID Panel     Status: None (Preliminary result)   Collection Time: 01/09/24  3:51 AM   Specimen: BLOOD RIGHT ARM  Result Value Ref Range Status   Specimen Description BLOOD RIGHT ARM  Final   Special Requests   Final    BOTTLES DRAWN AEROBIC AND ANAEROBIC Blood Culture adequate volume   Culture   Final    NO GROWTH 1 DAY Performed at [facility], [address], [place], [place] [address]    Report Status PENDING  Incomplete    Labs: CBC: Recent Labs  Lab 01/08/24 1516 01/09/24 0345 01/10/24 0319  WBC 11.3* 9.6 8.2  NEUTROABS 8.5* 7.0 5.4  HGB 10.1* 8.9* 8.5*  HCT 31.4* 27.6* 25.7*  MCV 98.1 95.5 94.5  PLT 355 317 318   Basic Metabolic Panel: Recent Labs  Lab 01/08/24 1516 01/09/24 0345 01/10/24 0319  NA 135 135 136  K 4.1 4.0 3.7  CL 100 101 103  CO2 24 22 23   GLUCOSE 178* 100* 94  BUN  30* 28* 32*  CREATININE 1.25* 1.25* 1.44*  CALCIUM 10.0 9.5 8.8*  MG  --  1.8 1.7   Liver Function Tests: No results for input(s): AST, ALT, ALKPHOS, BILITOT, PROT, ALBUMIN in the last 168 hours. CBG: No results for input(s): GLUCAP in the last 168 hours.  Discharge time spent: 42 minutes  Signed: Deliliah Room, MD [facility] 01/10/2024

## 2024-01-10 NOTE — TOC Transition Note (Signed)
 Transition of Care [facility]) - Discharge Note   Patient Details  Name: Kathryn [name] MRN: [id number] Date of Birth: [birth year]  Transition of Care [facility]) CM/SW Contact:  Isaiah Public, LCSWA Phone Number: 01/10/2024, 12:36 PM   Clinical Narrative:     Patient will DC to: [facility]   Anticipated DC date: 01/10/2024  Family notified: Darina  Transport by: [facility]  ?  Per MD patient ready for DC to [facility] . RN, patient, patient's family, and facility notified of DC. Discharge Summary sent to facility. RN given number for report [phone] RM# [id number]. DC packet on chart. DNR signed by MD attached to patients DC packet. Ambulance transport requested for patient.  CSW signing off.   Final next level of care: Skilled Nursing Facility Barriers to Discharge: No Barriers Identified   Patient Goals and CMS Choice Patient states their goals for this hospitalization and ongoing recovery are:: SNF   Choice offered to / list presented to : Patient      Discharge Placement              Patient chooses bed at: [facility] Patient to be transferred to facility by: [facility] Name of family member notified: Darina Patient and family notified of of transfer: 01/10/24  Discharge Plan and Services Additional resources added to the After Visit Summary for                                       Social Drivers of Health (SDOH) Interventions SDOH Screenings   Food Insecurity: No Food Insecurity (01/09/2024)  Housing: Low Risk  (01/09/2024)  Transportation Needs: No Transportation Needs (01/09/2024)  Utilities: Not At Risk (01/09/2024)  Social Connections: Patient Declined (01/09/2024)  Tobacco Use: Low Risk  (01/08/2024)     Readmission Risk Interventions    01/09/2024   12:20 PM  Readmission Risk Prevention Plan  Transportation Screening Complete  PCP or Specialist Appt within 5-7 Days Complete  Home Care Screening Complete  Medication Review (RN CM) Complete

## 2024-01-10 NOTE — TOC Progression Note (Addendum)
 Transition of Care [facility]) - Progression Note    Patient Details  Name: Kathryn [name] MRN: [id number] Date of Birth: 07-29-1927  Transition of Care [facility]) CM/SW Contact  Isaiah Public, LCSWA Phone Number: 01/10/2024, 11:50 AM  Clinical Narrative:     Patients insurance authorization has been approved for SNF. Plan Auth ID# [id number], Auth ID# [id number]. Insurance authorization approved from 7/4-7/8. CSW awaiting call back from [place] with [facility].  Update- CSW spoke with Josh with [facility] who confirmed patient can dc over today. CSW informed MD.  Expected Discharge Plan: Skilled Nursing Facility Barriers to Discharge: English as a second language teacher, Continued Medical Work up  Expected Discharge Plan and Services       Living arrangements for the past 2 months: Skilled Nursing Facility, Single Family Home                                       Social Determinants of Health (SDOH) Interventions SDOH Screenings   Food Insecurity: No Food Insecurity (01/09/2024)  Housing: Low Risk  (01/09/2024)  Transportation Needs: No Transportation Needs (01/09/2024)  Utilities: Not At Risk (01/09/2024)  Social Connections: Patient Declined (01/09/2024)  Tobacco Use: Low Risk  (01/08/2024)    Readmission Risk Interventions    01/09/2024   12:20 PM  Readmission Risk Prevention Plan  Transportation Screening Complete  PCP or Specialist Appt within 5-7 Days Complete  Home Care Screening Complete  Medication Review (RN CM) Complete

## 2024-01-12 LAB — BPAM RBC
Blood Product Expiration Date: 202508112359
Blood Product Expiration Date: 202508112359
Unit Type and Rh: 9500
Unit Type and Rh: 9500

## 2024-01-12 LAB — TYPE AND SCREEN
ABO/RH(D): B NEG
Antibody Screen: POSITIVE
Unit division: 0
Unit division: 0

## 2024-01-13 NOTE — Care Management Important Message (Signed)
 Important Message  Patient Details  Name: Lavita Pontius O'Daniel MRN: [id number] Date of Birth: 08-30-1927   Important Message Given:    Patient left prior to IM delivery will mail to the patient home address.     Aneisha Skyles 01/13/2024, 8:49 AM

## 2024-01-14 LAB — CULTURE, BLOOD (ROUTINE X 2)
Culture: NO GROWTH
Culture: NO GROWTH
Special Requests: ADEQUATE

## 2024-01-16 ENCOUNTER — Encounter: Payer: Self-pay | Admitting: Student

## 2024-01-16 ENCOUNTER — Ambulatory Visit: Admitting: Student

## 2024-01-16 VITALS — BP 117/72 | HR 75 | Ht 64.0 in | Wt 114.0 lb

## 2024-01-16 DIAGNOSIS — N183 Chronic kidney disease, stage 3 unspecified: Secondary | ICD-10-CM | POA: Diagnosis not present

## 2024-01-16 DIAGNOSIS — I480 Paroxysmal atrial fibrillation: Secondary | ICD-10-CM | POA: Diagnosis not present

## 2024-01-16 DIAGNOSIS — S81801D Unspecified open wound, right lower leg, subsequent encounter: Secondary | ICD-10-CM | POA: Diagnosis not present

## 2024-01-16 DIAGNOSIS — I10 Essential (primary) hypertension: Secondary | ICD-10-CM | POA: Diagnosis not present

## 2024-01-16 NOTE — Progress Notes (Signed)
 Referring Provider Shayne Anes, MD [address] [place],  [place] [address]   CC:  Chief Complaint  Patient presents with   Follow-up      Kathryn [name] is an 89 y.o. female.  HPI: Patient is a 89 year old female who fell several weeks ago hitting her pretibial region on a metal table.  She sustained a degloving injury which was sutured with dissolvable sutures in the emergency department.  Patient was then discharged to a SNF where she was unable to ambulate due to pain and return to the hospital for reevaluation of her wound.  Plastics was consulted to assist with the wound.  Patient was seen by Dr. Waddell in the hospital on 01/09/2024.  On exam, the wound to the right pretibial area was approximately 15 cm x 8 cm.  The tissue was completely intact over the bone.  There is no drainage from the wound.  It was believed that patient would not benefit from debridement and placement of a tissue matrix.  She did not have a wound that would require transfer of free tissue reconstruction.  It was recommended that patient apply Adaptic covered by Vashe soaked gauze and ABD pad over the wound and this dressing would be changed twice daily.  Today, patient presents with her son's girlfriend.  Patient reports she is doing pretty well.  She states that she sometimes has pain to her right lower extremity, but has been taking tramadol  for the pain.  She denies any fevers or chills.  She does reports she noticed a little bit of swelling to her lower extremity last night.  She denies any chest pain or shortness of breath.  Review of Systems General: Denies any fevers or chills.  Physical Exam    01/16/2024    2:05 PM 01/10/2024    9:28 AM 01/10/2024    3:53 AM  Vitals with BMI  Height 5' 4    Weight 114 lbs    BMI 19.56    Systolic 117 133 885  Diastolic 72 74 63  Pulse 75 70 74    General:  No acute distress,  Alert and oriented, Non-Toxic, Normal speech and affect On exam, patient is  sitting upright in no acute distress.  Wound to the right lower extremity is approximately 11 cm x 8 cm at its longest and widest.  There is swelling noted to the lower leg inferior to the wound, towards the ankle.  There is no significant tenderness to palpation.  No cellulitic changes to the skin or crepitus noted.  Throughout the wound, there does appear to be some granulation tissue, there is exudate noted as well.  There is some eschar noted out laterally.  There was also some nonviable skin that was debrided with scissors.  The patient tolerated well.  Assessment/Plan Wound of right lower extremity, subsequent encounter   Recommended that patient apply Vashe soaked gauze to the wound for 5 minutes and then removed.  Recommended then Xeroform dressings followed by Kerlix and Ace wrap.  Emphasized the importance of wrapping the dressings distally to proximally to help with the swelling.  Also discussed with the patient I would like her to elevate her leg to help with swelling as well.  She expressed understanding.  I discussed with the patient that if she notices any worsening swelling, or notices any redness, worsening pain, chest pain or shortness of breath, she needs to go to the emergency room.  Patient expressed understanding.  Instructions were written  for the patient's facility and given back to the patient.  I would like to see the patient back next week.  I instructed her to call in the meantime if she has any questions or concerns about anything.  Pictures were obtained of the patient and placed in the chart with the patient's or guardian's permission.   Kathryn [name] 01/16/2024, 3:00 PM        For preoperative clearance

## 2024-01-21 ENCOUNTER — Encounter: Payer: Self-pay | Admitting: Student

## 2024-01-23 ENCOUNTER — Ambulatory Visit: Admitting: Student

## 2024-01-23 DIAGNOSIS — S81801D Unspecified open wound, right lower leg, subsequent encounter: Secondary | ICD-10-CM

## 2024-01-23 NOTE — Telephone Encounter (Signed)
 LMOVM for pt to send manual transmission with her home remote monitor.

## 2024-01-23 NOTE — Progress Notes (Signed)
   Referring Provider Shayne Anes, MD [address] [place],  [place] [address]   CC:  Chief Complaint  Patient presents with   Follow-up      Kathryn [name] is an 89 y.o. female.  HPI: Patient is a 89 year old female with history of a right lower extremity wound status post fall.  She presents to the clinic today for further management of her wound.  Patient was last seen in the clinic on 01/16/2024.  At this visit, patient was doing well.  On exam, the wound to the right lower extremity was approximately 11 cm x 8 cm at its longest and widest.  There was a little bit of swelling noted to the lower leg inferior to the wound/toward the ankle.  There was granulation tissue and exudate noted to the wound.  Recommended that patient apply Vashe soaked gauze to the wound for 5 minutes and then removed.  Recommended Xeroform dressings followed by Kerlix and Ace wrap.  Today, patient presents with her son at bedside.  Patient reports overall she is doing pretty well.  She states she has been up and walking more at her SNF.  She denies any fevers or chills.  She reports that the swelling has been improving.  She denies any chest pain or shortness of breath.  Review of Systems General: Denies any fevers or chills  Physical Exam    01/16/2024    2:05 PM 01/10/2024    9:28 AM 01/10/2024    3:53 AM  Vitals with BMI  Height 5' 4    Weight 114 lbs    BMI 19.56    Systolic 117 133 885  Diastolic 72 74 63  Pulse 75 70 74    General:  No acute distress,  Alert and oriented, Non-Toxic, Normal speech and affect On exam, patient is sitting upright in no acute distress in her wheelchair.  Wound to the right lower extremity is approximately 11 x 8 cm at its longest and widest.  The majority of the wound is noted to have exudate over the top of it, there is some healthy appearing tissue within the wound.  There is some eschar noted to the medial and lateral aspects of the wound as well.  Some of the  nonviable tissue was debrided with scissors, patient tolerated well.  There is no surrounding erythema or drainage.  There is some mild swelling to the ankle.  There is no overlying erythema.  No tenderness to the calf or tenderness to the areas of swelling.  Assessment/Plan  Wound of right lower extremity, subsequent encounter   Discussed with patient that I would like the SNF to continue with dressing changes as prescribed before.  Discussed with her the importance of wrapping from her foot up towards her leg to help with swelling as well as elevating her right lower extremity.  Patient expressed understanding.  I discussed with the patient's son that when she is discharged from the SNF, they may reach out to us  to help set up home health for her to help with dressing changes.  I would like the patient to follow back up next week.  I instructed the patient to call in the meantime if she has any questions or concerns about anything.  Pictures were obtained of the patient and placed in the chart with the patient's or guardian's permission.   Kathryn [name] 01/23/2024, 2:49 PM

## 2024-01-27 ENCOUNTER — Telehealth: Payer: Self-pay | Admitting: Student

## 2024-01-27 NOTE — Telephone Encounter (Signed)
 Kathryn [name],   Thanks for the message. I can put in the home health orders once she is discharged from her SNF. I also added the CMAs on this message.   Thank you, Estefana

## 2024-01-27 NOTE — Telephone Encounter (Signed)
 Pt just called and asked when will she be getting a home health person to come out to her home. Pt is still in the SNF and does not know when she will be released. Pt wants to know will she be able to get home health if they make her leave today or sometime this week. Please advise the pt. Pt is aware she has apt this week

## 2024-01-29 NOTE — Telephone Encounter (Signed)
 Letter sent.

## 2024-01-30 ENCOUNTER — Ambulatory Visit: Admitting: Student

## 2024-01-30 VITALS — BP 138/80 | HR 76

## 2024-01-30 DIAGNOSIS — S81801D Unspecified open wound, right lower leg, subsequent encounter: Secondary | ICD-10-CM | POA: Diagnosis not present

## 2024-01-30 NOTE — Progress Notes (Signed)
 Referring Provider Shayne Anes, MD [address] [place],  [place] [address]   CC:  Chief Complaint  Patient presents with   Follow-up      Kathryn [name] is an 89 y.o. female.  HPI: Patient is a 89 year old female with history of a right lower extremity wound status post fall. She presents to the clinic today for further management of her wound.   Patient was last seen in the clinic on 01/23/2024.  At this visit, patient was overall doing pretty well.  On exam, the wound measured approximately 11 x 8 cm.  There was some exudate over the top of some healthy appearing tissue.  There was some eschar noted as well.  There is some nonviable tissue debrided with scissors.  Recommended that patient continue with dressing changes at her SNF.  Today, she presents with her sons significant other at bedside.  Patient reports overall she has been doing well.  She states that she has been getting up and moving around.  She denies any fevers or chills.  She does report short intermittent stinging sensations to her wound.  Review of Systems General: Denies any fevers or chills.  Physical Exam    01/16/2024    2:05 PM 01/10/2024    9:28 AM 01/10/2024    3:53 AM  Vitals with BMI  Height 5' 4    Weight 114 lbs    BMI 19.56    Systolic 117 133 885  Diastolic 72 74 63  Pulse 75 70 74    General:  No acute distress,  Alert and oriented, Non-Toxic, Normal speech and affect On exam, patient is sitting upright in no acute distress.  Wound itself appears to have improved.  Eschar to the lateral aspect of the wound has sloughed off.  There does appear to be little spots of granulation tissue with exudate throughout the remainder of the wound.  The skin surrounding the wound does appear to be irritated.  It is mildly tender to palpation and is slightly red.  Swelling to the ankle appears to be significantly improved from previous exam.  Assessment/Plan  Wound of right lower extremity, subsequent  encounter   I recommended that patient continue dressing changes with Vashe soaked gauze, Xeroform and wrapping Curlex distally from the foot up the leg followed by Ace wrap.  Instructions were written for patient to give to the nursing home.  She expressed understanding.  I did discuss with the patient given the redness and tenderness, I recommended we cover her with an antibiotic.  Patient is unsure if she is on an antibiotic right now.  Medication list was not sent with the patient.  I did write on the instruction sheet I would like patient to be placed on Keflex  for 5 days.  I did attempt to call the SNF to further discuss ordering the antibiotic, but they did not answer and I left a voicemail for them to return my call.  I would like the patient to follow back up next week.  I discussed with the patient that if she does get discharged from her SNF, we can go ahead and try to order home health for her.  She expressed understanding.  I instructed the patient to call if she has any questions or concerns about anything.  Pictures were obtained of the patient and placed in the chart with the patient's or guardian's permission.   Kathryn [name] 01/30/2024, 2:56 PM

## 2024-02-06 ENCOUNTER — Ambulatory Visit: Admitting: Student

## 2024-02-06 ENCOUNTER — Encounter: Payer: Self-pay | Admitting: Student

## 2024-02-06 VITALS — BP 143/79 | HR 70

## 2024-02-06 DIAGNOSIS — S81801D Unspecified open wound, right lower leg, subsequent encounter: Secondary | ICD-10-CM

## 2024-02-06 DIAGNOSIS — L89151 Pressure ulcer of sacral region, stage 1: Secondary | ICD-10-CM | POA: Diagnosis not present

## 2024-02-06 NOTE — Progress Notes (Signed)
 Referring Provider Shayne Anes, MD [address] [place],  [place] [address]   CC:  Chief Complaint  Patient presents with   Follow-up      Kathryn [name] is an 89 y.o. female.  HPI: Patient is a 89 year old female with history of a right lower extremity wound status post fall. She presents to the clinic today for further management of her wound.   Patient was last seen in the clinic on 01/30/2024.  At this visit, patient was doing well.  On exam, there were a little spots of granulation tissue with exudate noted throughout the wound.  The skin surrounding the wound did appear to be a little bit irritated.  Swelling to the leg appear to be greatly improved.  Today, presents with her son significant other at bedside.  Patient reports she is overall been doing well.  She states that she was discharged from the SNF and now is at home.  She states that nurses have been coming out 3 times a week to change her dressings.  She is unsure who these nurses are affiliated with.  Patient denies any other issues or concerns to her right lower extremity wound.  She does state that she has been having some pain to her sacrum which she would like evaluated today.  She denies any fevers, chills, chest pain or shortness of breath.  Review of Systems General: Denies any fevers or chills  Physical Exam    02/06/2024    3:09 PM 01/30/2024    2:54 PM 01/16/2024    2:05 PM  Vitals with BMI  Height   5' 4  Weight --  114 lbs  BMI   19.56  Systolic 143 138 882  Diastolic 79 80 72  Pulse 70 76 75    General:  No acute distress,  Alert and oriented, Non-Toxic, Normal speech and affect On exam, patient is sitting upright in no acute distress.  Wound to the right lower extremity is approximately 6 cm x 10 cm at its longest and widest.  There does appear to be some spots of granulation tissue within the wound, there is also some exudate noted.  The surrounding skin appears to be less irritated than last  week.  There still is a little bit of swelling present to the leg, but improved from previous visits.  To the sacrum, there appears to be the beginning of a pressure injury to the right gluteal cleft.  It does not appear infected at this time.  Assessment/Plan Pressure injury of sacral region, stage 1 - Plan: AMB referral to wound care center  Wound of right lower extremity, subsequent encounter   Recommended that patient continue with wound care to the right lower extremity as she has been with Vashe and Xeroform daily.  Recommend wrapping the right lower extremity distally to proximally with Ace wrap and Kerlix.  I discussed this with the patient's sons significant other and discussed with them that they should be changing it in between nurse visits.  I recommended that patient send us  the information of the home health agency/nurses that have been coming out to her house so that we can send orders.  She expressed understanding and states that she will let us  know who the nurses are affiliated with.  For the sacrum, the area was cleaned with Vashe today and a Mepilex border was placed over the area.  I discussed with the patient that it looks like she has the beginning of a  pressure ulcer.  I recommended that she offload from the area when she can.  I also recommended that she follow-up with outpatient wound care to further manage the pressure injury.  She expressed understanding.  I would like the patient to follow back up next week.  I instructed her to call if she has any questions or concerns in the meantime.  Pictures were obtained of the patient and placed in the chart with the patient's or guardian's permission.   Kathryn [name] 02/06/2024, 3:59 PM

## 2024-02-10 ENCOUNTER — Telehealth: Payer: Self-pay | Admitting: *Deleted

## 2024-02-10 NOTE — Telephone Encounter (Signed)
 Waited on (02/07/24) to hear from the patient regarding wound care.    Called the patient regarding her wound care,and she stated a lady changes her dressing on Wed,Thurs,Friday.  Patient was not sure what kind of dressing that was been used on the wound.    She stated that two gentleman came to talk with her as well, and they stated that they will be bringing supplies next Mon and Wed,but she can't remember all the facts. Asked the patient if she knew when they would be coming back.  Patient stated that she did not know.  Informed the patient that I had wound care orders for them.  She gave me a phone and fax number.  Phone#[phone]                         Fax#[phone]  Called the number that the patient gave me it was to [facility] not Home Health.   Call [facility] and spoke with Rosina and was informed that they have not started service with the patient yet.//AR/CMA

## 2024-02-11 ENCOUNTER — Telehealth: Payer: Self-pay | Admitting: Student

## 2024-02-11 NOTE — Telephone Encounter (Signed)
 Patient would like to know if you had any information about at home care regarding her wound, She also stated she never received the wound care products, please reach out and advise.

## 2024-02-12 ENCOUNTER — Telehealth: Payer: Self-pay | Admitting: *Deleted

## 2024-02-12 NOTE — Telephone Encounter (Signed)
 Called and spoke with Kathryn [name] at [facility] to see if they received the fax with instructions for wound care for the patient.  Was informed that they did received the wound care instructions.  Was also informed that the patient had a home visit on (02/11/24), and will have a scheduled visit on (02/14/24).//AB/CMA

## 2024-02-13 ENCOUNTER — Other Ambulatory Visit: Payer: Self-pay | Admitting: Student

## 2024-02-13 ENCOUNTER — Ambulatory Visit: Admitting: Student

## 2024-02-13 ENCOUNTER — Encounter: Payer: Self-pay | Admitting: Student

## 2024-02-13 VITALS — BP 165/89 | HR 80

## 2024-02-13 DIAGNOSIS — M7989 Other specified soft tissue disorders: Secondary | ICD-10-CM

## 2024-02-13 DIAGNOSIS — W19XXXD Unspecified fall, subsequent encounter: Secondary | ICD-10-CM

## 2024-02-13 DIAGNOSIS — S81801D Unspecified open wound, right lower leg, subsequent encounter: Secondary | ICD-10-CM

## 2024-02-13 NOTE — Progress Notes (Signed)
 Referring Provider Shayne Anes, MD [address] [place],  [place] [address]   CC:  Chief Complaint  Patient presents with   Follow-up      Kathryn [name] is an 89 y.o. female.  HPI: Patient is a 89 year old female with history of a right lower extremity wound status post fall. She presents to the clinic today for further management of her wound.   Patient was last seen in the clinic on 02/06/2024.  At this visit, patient was overall doing well.  On exam, the wound to the right lower extremity was approximately 6 cm and 10 cm at its longest and widest.  There were some spots of granulation tissue, but exudate throughout the wound.  To the sacrum, there did appear to be the beginning of a pressure injury to the right gluteal cleft.  Today, patient presents with her family member at bedside.  She reports overall she is doing well he is doing well.  She states that states that home health has still not brought the supplies yet, and the last time her dressing was changed was Sunday as that was the last it was the last time home health came.  She denies any increased pain to her leg.  She states that she has not been elevating it as much recently.  She denies any chest pain or shortness of breath.  Review of Systems General: Denies any fevers, chills  Physical Exam    02/13/2024    1:55 PM 02/06/2024    3:09 PM 01/30/2024    2:54 PM  Vitals with BMI  Weight  --   Systolic 165 143 861  Diastolic 89 79 80  Pulse 80 70 76    General:  No acute distress,  Alert and oriented, Non-Toxic, Normal speech and affect On exam, patient is sitting upright in no acute distress.  Wound to the lower extremity appears to be similar to previous exam.  There appears to be exudate throughout the majority of the wound with small spots of granulation tissue.  There is no drainage on exam.  The wound does not appear to be infected.  There is however more swelling noted to the proximal lower extremity.   Pressure wound to the sacrum appears to be also similar to previous exam.  Assessment/Plan Swelling of right lower extremity - Plan: US  Venous Img Lower Unilateral Right (DVT), CANCELED: VAS US  LOWER EXTREMITY VENOUS (DVT), CANCELED: US  Venous Img Lower Unilateral Left (DVT)   I discussed with the patient that given the swelling she is experiencing to her right lower extremity, I recommended that the obtain an ultrasound to rule out DVT.  Given patient history, suspicion for DVT is lower and I suspect that the swelling she is experiencing is more related to dependent edema.  I still recommended that we obtain DVT study as soon as possible.  She was in agreement with this.  We were able to get her in for imaging tomorrow with [facility] imaging.  I did discuss with the patient that I do have an extremely low threshold for going to the emergency room.  I discussed with her that if she develops any worsening pain in her leg swelling, shortness of breath or chest pain she needs to go to the emergency room immediately.  She expressed understanding was in agreement with this.  Patient to continue with current wound care regimen, however I did discuss with her and the patient's family member the importance of the dressing getting  changed daily.  They expressed understanding and reports that the home health agency is supposed to bring the supplies over tomorrow.  I also discussed with the patient the importance of keeping the sacrum clean and to change the Mepilex border dressing daily.  We also discussed the importance of offloading.  She expressed understanding.  I would like the patient to follow back up next week.  I instructed her to call if she has any questions or concerns about anything.  Pictures were obtained of the patient and placed in the chart with the patient's or guardian's permission.   Kathryn [name] FORBES Peck 02/13/2024, 4:40 PM

## 2024-02-14 ENCOUNTER — Telehealth: Payer: Self-pay | Admitting: *Deleted

## 2024-02-14 ENCOUNTER — Ambulatory Visit: Payer: Self-pay | Admitting: Student

## 2024-02-14 ENCOUNTER — Ambulatory Visit
Admission: RE | Admit: 2024-02-14 | Discharge: 2024-02-14 | Disposition: A | Discharge status: Home / Self Care | Source: Ambulatory Visit | Attending: Student | Admitting: Student

## 2024-02-14 DIAGNOSIS — M7989 Other specified soft tissue disorders: Secondary | ICD-10-CM

## 2024-02-14 NOTE — Telephone Encounter (Signed)
 Patient had ultrasound of the right lower extremity to rule out DVT.  Ultrasound was negative for DVT.  I attempted to call the patient twice to discuss results.  She did not answer.  I did leave a voicemail to have her return my call.

## 2024-02-14 NOTE — Telephone Encounter (Signed)
 Faxed wound care instructions to [facility] for the patient.  Confirmation received and copy scanned into the chart.//AB/CMA

## 2024-02-17 ENCOUNTER — Telehealth: Payer: Self-pay | Admitting: *Deleted

## 2024-02-17 NOTE — Telephone Encounter (Signed)
 Received on (02/17/2024) via of fax Physician Order from [facility].  Requesting signature,date,return.  Given to provider to complete.    Physician Order signed and faxed back to [facility].  Confirmation received and copy scanned into the chart.//AR/CMA

## 2024-02-18 NOTE — Progress Notes (Signed)
 Referring Provider Shayne Anes, MD [address] [place],  [place] [address]   CC:  Chief Complaint  Patient presents with   Follow-up      Kathryn [name] is an 89 y.o. female.  HPI: Patient is a pleasant 89 year old female with chronic RLE wound subsequent to fall who returns to clinic for evaluation.  She was seen most recently here in clinic on 02/13/2024.  At that time, her home health nurse team has not yet provided wound care supplies as ordered.  She also had the beginning of a sacral pressure wound noted.  She complained of some increased pain in her leg and DVT study was ordered and negative.  As for wound care, recommended continued Vashe soaked gauze and Xeroform secured with Kerlix and Ace wrap.  She also recommended bordered Mepilex dressing for the sacral area and to offload is much as possible.  Today, patient is doing well.  She is pleased that the DVT study was negative.  She reports that her discomfort has improved since last encounter.  She states that she has been ambulating more recently and using a cane rather than walker which perhaps is contributing to her swelling which is bilateral.  She does not wear compressive stockings.  She is accompanied by her son at bedside.  They report that they have had issues with home health.  They only come out once per week rather than 3 times per week as requested.  Her son's fianc has been the one assisting with her dressing changes on the right lower leg.  She also inquires about sacral pressure ulcer that she believes developed in the hospital while admitted.  She has not seen her primary care provider since her hospitalization.  Patient tells me that she sleeps on her back.    Allergies  Allergen Reactions   Atorvastatin Other (See Comments)    Myalgia   Darvocet [Propoxyphene N-Acetaminophen ] Other (See Comments)    Hallucination    Ketamine  Other (See Comments)    Altered mental status, hallucinations   Valsartan  Other (See Comments)    Dizziness, unsteady gait   Acetaminophen -Codeine Other (See Comments)    Reaction type/severity unknown   Codeine Other (See Comments)    Reaction type/severity unknown    Percocet [Oxycodone-Acetaminophen ] Other (See Comments)    Hallucinations    Procaine Other (See Comments)    Reaction type/severity unknown    Epinephrine  Palpitations and Other (See Comments)    Severe increase in heart rate (has pacemaker)   Ibandronate Other (See Comments)    Difficulty swallowing, concerns about side-effects   Iron  Diarrhea and Nausea Only   Levofloxacin Other (See Comments)    Other reaction(s): shoulder pain. Patient has successfully taken ciprofloxacin   Proton Pump Inhibitors Diarrhea    Outpatient Encounter Medications as of 02/20/2024  Medication Sig   acetaminophen  (TYLENOL ) 500 MG tablet Take 1 tablet (500 mg total) by mouth every 6 (six) hours as needed.   aspirin  EC 81 MG tablet Take 81 mg by mouth every morning.    calcium carbonate (SUPER CALCIUM) 1500 (600 Ca) MG TABS tablet Take 1,500 mg by mouth daily with breakfast.   carvedilol  (COREG ) 12.5 MG tablet Take 1 tablet (12.5 mg total) by mouth 2 (two) times daily.   cholecalciferol  (VITAMIN D ) 1000 units tablet Take 1,000 Units by mouth daily.   CRANBERRY PO Take 1 capsule by mouth daily in the afternoon.   doxycycline  (VIBRA -TABS) 100 MG tablet Take 1  tablet (100 mg total) by mouth 2 (two) times daily.   EVENING PRIMROSE OIL PO Take 1 capsule by mouth every evening.   furosemide  (LASIX ) 20 MG tablet Take 1 tablet (20 mg total) by mouth daily.   ondansetron  (ZOFRAN -ODT) 4 MG disintegrating tablet Take 4 mg by mouth every 8 (eight) hours as needed for nausea or vomiting.   Polyethyl Glycol-Propyl Glycol (SYSTANE OP) Place 1 drop into both eyes daily as needed (dry eyes).   TART CHERRY PO Take 3 capsules by mouth daily in the afternoon.   traMADol  (ULTRAM ) 50 MG tablet Take 1 tablet (50 mg total) by mouth  every 8 (eight) hours as needed for moderate pain (pain score 4-6) (mild pain).   No facility-administered encounter medications on file as of 02/20/2024.     Past Medical History:  Diagnosis Date   Arthritis    HANDS   Bradycardia    CKD stage 3a, GFR 45-59 ml/min (HCC) 12/24/2023   DVT (deep venous thrombosis) (HCC) 2010   Dysrhythmia    HX OF PAROXYSMAL ATRIAL FIB- TAKES ASPIRIN  FOR BLOOD THINNER   GERD (gastroesophageal reflux disease)    Hypertension    Peripheral vascular disease (HCC)    Blood clot behind Right knee   Presence of permanent cardiac pacemaker    COMPLETE HEART BLOCK--DR. CROITORU     Past Surgical History:  Procedure Laterality Date   ABDOMINAL HYSTERECTOMY     APPENDECTOMY     AUGMENTATION MAMMAPLASTY Bilateral    BREAST ENHANCEMENT SURGERY     BREAST IMPLANT REMOVAL Bilateral 08/28/2017   Procedure: REMOVAL BREAST IMPLANTS BILATERAL;  Surgeon: Lowery Estefana RAMAN, DO;  Location: [facility];  Service: Plastics;  Laterality: Bilateral;   CATARACT EXTRACTION, BILATERAL     CHOLECYSTECTOMY     COLONOSCOPY N/A 03/05/2014   Procedure: COLONOSCOPY;  Surgeon: Alm VEAR Angle, MD;  Location: [facility];  Service: General;  Laterality: N/A;   COLOSTOMY CLOSURE N/A 05/21/2014   Procedure: LAPAROSCOPIC ASSISTED CLOSURE OF COLOSTOMY AND REPAIR OF TRANSVERSE COLON AND ENTEROTOMY;  Surgeon: Alm Angle, MD;  Location: [facility];  Service: General;  Laterality: N/A;   EP IMPLANTABLE DEVICE N/A 10/04/2015   Procedure: PPM/BIV PPM Generator Changeout;  Surgeon: Jerel Balding, MD;  [facility]: [facility];  Service: Cardiovascular;  Laterality: N/A;   LAPAROTOMY N/A 08/27/2013   Procedure: LAPAROSCOPY CONVERTED TO OPEN LAPAROTOMY WITH SIGMOID COLECTOMY AND COLOSTOMY;  Surgeon: Alm VEAR Angle, MD;  Location: [facility];  Service: General;  Laterality: N/A;   OPEN REDUCTION INTERNAL FIXATION (ORIF) DISTAL RADIAL FRACTURE Left 02/15/2015   Procedure: OPEN  TREATMENT OF LEFT DISTAL RADIUS FRACTURE;  Surgeon: Alm Hummer, MD;  Location: [facility];  Service: Orthopedics;  Laterality: Left;   PACEMAKER INSERTION  12/02/1998   medtronic   PERMANENT PACEMAKER GENERATOR CHANGE  02/28/2007   Medtronic Adapta   TONSILLECTOMY     US  ECHOCARDIOGRAPHY  10/09/2011   mild LAE, mild MR,mild to mod TR    Family History  Problem Relation Age of Onset   Heart disease Mother    Heart disease Father    Breast cancer Neg Hx     Social History   Social History Narrative   ** Merged History Encounter **         Review of Systems General: Denies fevers or chills Cardio: Denies chest pain Pulmonary: Denies difficulty breathing MSK: Endorses improved ambulation  Physical Exam    02/20/2024  4:31 PM 02/20/2024    3:30 PM 02/13/2024    1:55 PM  Vitals with BMI  Systolic 171 171 834  Diastolic 90 77 89  Pulse 70 76 80    General:  No acute distress, nontoxic appearing  Respiratory: No increased work of breathing Neuro: Alert and oriented Psychiatric: Normal mood and affect  Skin: See pictures below.  RLE wound measures 10.5 x 6.5 x 0.1 cm.  Some residual slough within wound bed, but improved granulation.  Mild to moderate pitting edema of lower extremities bilaterally.  No significant difference with regard to symmetry.  Right lower leg more erythematous, not unusual after these type of traumatic injuries in lower legs.  No induration, malodor, drainage, crepitus, or concern for infection.  No TTP.       Assessment/Plan  Chronic RLE wound: - Significant improvement compared to photo obtained 02/13/2024 at which time there was suitably more slough within wound bed.  Now there is improved granulation. - Low suspicion for infection at this time.  She has what seems to be dependent edema of bilateral lower extremities.  Continue to elevate whenever possible.  Dressed her wounds with Xeroform followed by Kerlix and Ace wrap. -  Follow-up in 2 to 3 weeks for reevaluation, sooner if needed.  Stage II sacral pressure ulcer: - Emphasized the importance of offloading whenever possible.  She states that she sleeps on her back.  For that reason, we will recommend a medical foam mattress to help redistribute pressure.  Will relay to [facility] team who is in charge of her medical supplies. - Will also place order for the sacral ulcer bordered Mepilex dressings as a foam padding to use throughout the day.  This should be changed every other day.  Apply a thin film of Vaseline to the pressure ulcer before application of bordered Mepilex dressing. - Emphasized the patient that this is an issue that can rapidly progress and that it needs more round-the-clock attention (repositioning etc) that we cannot provide. - Discussed increased protein intake and optimize nutrition.  However, given that it sounds as though she may not be receiving adequate care at home from home health team, emphasizing the importance of following up with PCP.  May need to be set up with a SNF or rehab facility.  Hypertension: - Denies any cardiac symptoms today, but it does appear much more elevated than previous encounters. - See PCP.  Continue to check blood pressure regularly at home and take antihypertensives, as described.   Honora Seip PA-C 02/20/2024, 4:47 PM

## 2024-02-20 ENCOUNTER — Ambulatory Visit: Admitting: Physician Assistant

## 2024-02-20 ENCOUNTER — Encounter: Payer: Self-pay | Admitting: Physician Assistant

## 2024-02-20 ENCOUNTER — Encounter (HOSPITAL_BASED_OUTPATIENT_CLINIC_OR_DEPARTMENT_OTHER): Attending: Internal Medicine | Admitting: Internal Medicine

## 2024-02-20 VITALS — BP 171/90 | HR 70

## 2024-02-20 DIAGNOSIS — L89152 Pressure ulcer of sacral region, stage 2: Secondary | ICD-10-CM

## 2024-02-20 DIAGNOSIS — S81801D Unspecified open wound, right lower leg, subsequent encounter: Secondary | ICD-10-CM

## 2024-02-24 ENCOUNTER — Telehealth: Payer: Self-pay

## 2024-02-24 NOTE — Telephone Encounter (Signed)
 Faxed most recent PSS note to patient's PCP, Oneil Neth, MD to advise of Kathryn [name] concern for pressure ulcer of the sacrum.

## 2024-02-24 NOTE — Telephone Encounter (Signed)
 Spoke to a representative and he confirmed that patient is in a new program with her insurance company and has only been approved for home health one time a week. Will call patient and let her know.

## 2024-02-25 NOTE — Telephone Encounter (Signed)
 1st attempt failed due to no answer. Called PCP office, [facility] and confirmed that fax # is [phone]. Will update in patient's chart. Re-faxed. Awaiting fax success confirmation.

## 2024-02-27 NOTE — Telephone Encounter (Signed)
 Re-faxed most recent plastic surgery note to patient's pcp at [facility]. Faxed to [phone]; received fax success confirmation.

## 2024-03-04 NOTE — Progress Notes (Signed)
 Referring Provider Shayne Anes, MD [address] [place],  [place] [address]   CC:  Chief Complaint  Patient presents with   Follow-up      Kathryn [name] is an 89 y.o. female.  HPI: Patient is a pleasant 89 year old female with chronic RLE wound subsequent to fall who returns to clinic for evaluation.   She was last seen here in clinic on 02/20/2024.  At that time, she was pleased the DVT study for RLE swelling was negative.  Her discomfort had improved since previous encounter.  She was accompanied by her son and they report that they are having issues with home health.  Her son's fianc has been the one assisting with her dressing changes.  She also inquired about sacral pressure ulcer that reportedly developed while admitted in the hospital.  RLE wound measured 10.5 x 6.5 x 0.1 cm with some residual slough within wound bed, but improved granulation.  Recommended Xeroform followed by Kerlix and Ace wrap.  She also had a stage II sacral pressure ulcer.  Emphasized importance of offloading whenever possible.  Recommended medical foam mattress to help redistribute pressure and relayed to [facility] team in charge of her medical supplies.  Placed order for sacral ulcer bordered Mepilex dressings to be changed every other day.  She needs frequent repositioning.  Discussed increased protein intake and optimize nutrition.  If she does not receive adequate care at home, may need to be set up with a SNF or rehab facility.  Emphasized the importance of following up with PCP.  Blood pressure was also markedly elevated which was another reason for follow-up with her primary care team, Dr. Shayne, at [facility].  Today, patient is doing okay.  She states that her son's fianc still comes and changes her dressings when she is able.  She has not received a whole lot of assistance from [facility] team.  She believes that maybe she has developed a bladder infection and recently dropped off a sample with  her PCP, but still has not seen them for hospital follow-up since her discharge from the hospital 01/10/2024.   Allergies  Allergen Reactions   Atorvastatin Other (See Comments)    Myalgia   Darvocet [Propoxyphene N-Acetaminophen ] Other (See Comments)    Hallucination    Ketamine  Other (See Comments)    Altered mental status, hallucinations   Valsartan Other (See Comments)    Dizziness, unsteady gait   Acetaminophen -Codeine Other (See Comments)    Reaction type/severity unknown   Codeine Other (See Comments)    Reaction type/severity unknown    Percocet [Oxycodone-Acetaminophen ] Other (See Comments)    Hallucinations    Procaine Other (See Comments)    Reaction type/severity unknown    Epinephrine  Palpitations and Other (See Comments)    Severe increase in heart rate (has pacemaker)   Ibandronate Other (See Comments)    Difficulty swallowing, concerns about side-effects   Iron  Diarrhea and Nausea Only   Levofloxacin Other (See Comments)    Other reaction(s): shoulder pain. Patient has successfully taken ciprofloxacin   Proton Pump Inhibitors Diarrhea    Outpatient Encounter Medications as of 03/05/2024  Medication Sig   acetaminophen  (TYLENOL ) 500 MG tablet Take 1 tablet (500 mg total) by mouth every 6 (six) hours as needed.   aspirin  EC 81 MG tablet Take 81 mg by mouth every morning.    calcium carbonate (SUPER CALCIUM) 1500 (600 Ca) MG TABS tablet Take 1,500 mg by mouth daily with breakfast.  carvedilol  (COREG ) 12.5 MG tablet Take 1 tablet (12.5 mg total) by mouth 2 (two) times daily.   cholecalciferol  (VITAMIN D ) 1000 units tablet Take 1,000 Units by mouth daily.   CRANBERRY PO Take 1 capsule by mouth daily in the afternoon.   doxycycline  (VIBRA -TABS) 100 MG tablet Take 1 tablet (100 mg total) by mouth 2 (two) times daily.   EVENING PRIMROSE OIL PO Take 1 capsule by mouth every evening.   furosemide  (LASIX ) 20 MG tablet Take 1 tablet (20 mg total) by mouth daily.    ondansetron  (ZOFRAN -ODT) 4 MG disintegrating tablet Take 4 mg by mouth every 8 (eight) hours as needed for nausea or vomiting.   Polyethyl Glycol-Propyl Glycol (SYSTANE OP) Place 1 drop into both eyes daily as needed (dry eyes).   TART CHERRY PO Take 3 capsules by mouth daily in the afternoon.   traMADol  (ULTRAM ) 50 MG tablet Take 1 tablet (50 mg total) by mouth every 8 (eight) hours as needed for moderate pain (pain score 4-6) (mild pain).   No facility-administered encounter medications on file as of 03/05/2024.     Past Medical History:  Diagnosis Date   Arthritis    HANDS   Bradycardia    CKD stage 3a, GFR 45-59 ml/min (HCC) 12/24/2023   DVT (deep venous thrombosis) (HCC) 2010   Dysrhythmia    HX OF PAROXYSMAL ATRIAL FIB- TAKES ASPIRIN  FOR BLOOD THINNER   GERD (gastroesophageal reflux disease)    Hypertension    Peripheral vascular disease (HCC)    Blood clot behind Right knee   Presence of permanent cardiac pacemaker    COMPLETE HEART BLOCK--DR. CROITORU     Past Surgical History:  Procedure Laterality Date   ABDOMINAL HYSTERECTOMY     APPENDECTOMY     AUGMENTATION MAMMAPLASTY Bilateral    BREAST ENHANCEMENT SURGERY     BREAST IMPLANT REMOVAL Bilateral 08/28/2017   Procedure: REMOVAL BREAST IMPLANTS BILATERAL;  Surgeon: Lowery Estefana RAMAN, DO;  Location: [facility];  Service: Plastics;  Laterality: Bilateral;   CATARACT EXTRACTION, BILATERAL     CHOLECYSTECTOMY     COLONOSCOPY N/A 03/05/2014   Procedure: COLONOSCOPY;  Surgeon: Alm VEAR Angle, MD;  Location: [facility];  Service: General;  Laterality: N/A;   COLOSTOMY CLOSURE N/A 05/21/2014   Procedure: LAPAROSCOPIC ASSISTED CLOSURE OF COLOSTOMY AND REPAIR OF TRANSVERSE COLON AND ENTEROTOMY;  Surgeon: Alm Angle, MD;  Location: [facility];  Service: General;  Laterality: N/A;   EP IMPLANTABLE DEVICE N/A 10/04/2015   Procedure: PPM/BIV PPM Generator Changeout;  Surgeon: Jerel Balding, MD;  [facility];  Service: Cardiovascular;  Laterality: N/A;   LAPAROTOMY N/A 08/27/2013   Procedure: LAPAROSCOPY CONVERTED TO OPEN LAPAROTOMY WITH SIGMOID COLECTOMY AND COLOSTOMY;  Surgeon: Alm VEAR Angle, MD;  Location: [facility];  Service: General;  Laterality: N/A;   OPEN REDUCTION INTERNAL FIXATION (ORIF) DISTAL RADIAL FRACTURE Left 02/15/2015   Procedure: OPEN TREATMENT OF LEFT DISTAL RADIUS FRACTURE;  Surgeon: Alm Hummer, MD;  Location: [facility];  Service: Orthopedics;  Laterality: Left;   PACEMAKER INSERTION  12/02/1998   medtronic   PERMANENT PACEMAKER GENERATOR CHANGE  02/28/2007   Medtronic Adapta   TONSILLECTOMY     US  ECHOCARDIOGRAPHY  10/09/2011   mild LAE, mild MR,mild to mod TR    Family History  Problem Relation Age of Onset   Heart disease Mother    Heart disease Father    Breast cancer Neg Hx  Social History   Social History Narrative   ** Merged History Encounter **         Review of Systems General: Denies fevers or chills Skin: Endorses mild drainage  Physical Exam    02/20/2024    4:31 PM 02/20/2024    3:30 PM 02/13/2024    1:55 PM  Vitals with BMI  Systolic 171 171 834  Diastolic 90 77 89  Pulse 70 76 80    General:  No acute distress, nontoxic appearing  Respiratory: No increased work of breathing Neuro: Alert and oriented Psychiatric: Normal mood and affect  Skin: Right lower extremity wound now measures 10 x 6 cm at its greatest length.  However, the vertical, medial aspect of the wound does appear to have some advanced epithelialization moving centrally.  Surrounding skin tissue appears healthy. The sacral wound however is not improving.  No significant depth, but the surrounding erythema is concerning.  No induration or concern for infection, but concerned that this could portend further injury.         Assessment/Plan  Chronic RLE wound: - Continued improvement with regard to wound healing.  There is some  advanced epithelialization.  The surrounding skin tissue appears healthy. - Recommend continued Xeroform dressing changes every other day followed by Kerlix and Ace wrap.  - She does have some bilateral lower extremity edema, suspect dependent edema.  Ambulation and elevation when able.   Stage II sacral pressure ulcer: - Once again emphasized the importance of offloading whenever possible.  She confirms that she sleeps on her back because of a previous neck injury.  She has been using a pillow. - Patient needs to see her PCP about this issue.  We discussed this at length at last visit, and that she is still not seeing them.  She is now complaining of a UTI and her hypertension at last visit was also markedly elevated.  This is not something that we are managing from a plastic surgery standpoint. - Again discussed her possible need for skilled nursing facility or rehab facility given lack of support.  Her son has been excellent as has his fiance, but they are limited due to work.  Concerned that if this is neglected that we will get progressively worse and put her in the hospital.  She needs frequent repositioning and would benefit from air-fluidized mattress given that she is limited to sleeping on her back. - Again discussed increased protein intake and optimize nutrition.  Will continue to follow for her chronic RLE wound which has been making excellent progress, but made clear to patient that she needs to see her primary care provider for her other issues.    Picture(s) obtained of the patient and placed in the chart were with the patient's or guardian's permission.    Honora Seip PA-C 03/05/2024, 3:42 PM

## 2024-03-05 ENCOUNTER — Ambulatory Visit: Admitting: Physician Assistant

## 2024-03-05 DIAGNOSIS — S81801D Unspecified open wound, right lower leg, subsequent encounter: Secondary | ICD-10-CM | POA: Diagnosis not present

## 2024-03-05 DIAGNOSIS — L89152 Pressure ulcer of sacral region, stage 2: Secondary | ICD-10-CM

## 2024-03-10 ENCOUNTER — Ambulatory Visit (INDEPENDENT_AMBULATORY_CARE_PROVIDER_SITE_OTHER): Payer: Medicare Other

## 2024-03-10 DIAGNOSIS — I5032 Chronic diastolic (congestive) heart failure: Secondary | ICD-10-CM | POA: Diagnosis not present

## 2024-03-12 LAB — CUP PACEART REMOTE DEVICE CHECK
Battery Impedance: 1814 Ohm
Battery Remaining Longevity: 38 mo
Battery Voltage: 2.76 V
Date Time Interrogation Session: 20250902103628
Implantable Lead Connection Status: 753985
Implantable Lead Connection Status: 753985
Implantable Lead Implant Date: 20000522
Implantable Lead Implant Date: 20000526
Implantable Lead Location: 753859
Implantable Lead Location: 753860
Implantable Pulse Generator Implant Date: 20170328
Lead Channel Impedance Value: 405 Ohm
Lead Channel Impedance Value: 652 Ohm
Lead Channel Pacing Threshold Amplitude: 0.875 V
Lead Channel Pacing Threshold Amplitude: 0.875 V
Lead Channel Pacing Threshold Pulse Width: 0.4 ms
Lead Channel Pacing Threshold Pulse Width: 0.4 ms
Lead Channel Setting Pacing Amplitude: 2 V
Lead Channel Setting Pacing Amplitude: 2 V
Lead Channel Setting Pacing Pulse Width: 0.4 ms
Lead Channel Setting Sensing Sensitivity: 2.8 mV
Zone Setting Status: 755011
Zone Setting Status: 755011

## 2024-03-17 ENCOUNTER — Telehealth: Payer: Self-pay | Admitting: Cardiovascular Disease

## 2024-03-17 NOTE — Telephone Encounter (Signed)
 Pt c/o BP issue: STAT if pt c/o blurred vision, one-sided weakness or slurred speech.  STAT if BP is GREATER than 180/120 TODAY.  STAT if BP is LESS than 90/60 and SYMPTOMATIC TODAY  1. What is your BP concern? Patient is concerned her BP has been trending low  2. Have you taken any BP medication today?  Yes  3. What are your last 5 BP readings?    117/??  4. Are you having any other symptoms (ex. Dizziness, headache, blurred vision, passed out)?   A little blurred vision, weakness  Patient is concerned she had a medication change since being hospital for an injured leg.  Patient stated she was taken off Spironolactone  as it was making her dizzy and has only been taking Carvedilol .  Patient wants to go back to Metoprolol  and Olmesartan.

## 2024-03-17 NOTE — Telephone Encounter (Signed)
 Called patient back about message. Patient complaining of  dizziness and low on energy. Patient stated she takes Coreg  12.5 mg BID and rarely takes lasix  20 mg daily. Patient does not know what her BP and HR were today, but she thinks her SBP was 117. Informed patient that 117 is fine. Patient is wondering if she needs to take something else. Patient stated she would get a BP and HR when she sees doctor tomorrow afternoon while getting dressing changed on her leg. Tried to get patient an appointment to see someone now, but patient stated she has a hard time getting around and she does not want to see anyone at this time. Patient stated she will see Dr. Francyne in January. Will send message to doctor for advisement.

## 2024-03-17 NOTE — Progress Notes (Signed)
 Remote PPM Transmission

## 2024-03-18 NOTE — Telephone Encounter (Signed)
 With that limited info, hard to make recommendations. Would like to know her BP sitting and standing as well. Will check the vitals after that appt.

## 2024-03-18 NOTE — Telephone Encounter (Signed)
 Left a message to call back.

## 2024-03-19 ENCOUNTER — Ambulatory Visit: Admitting: Plastic Surgery

## 2024-03-19 VITALS — BP 171/93 | HR 76

## 2024-03-19 DIAGNOSIS — L89152 Pressure ulcer of sacral region, stage 2: Secondary | ICD-10-CM | POA: Diagnosis not present

## 2024-03-19 DIAGNOSIS — S81801D Unspecified open wound, right lower leg, subsequent encounter: Secondary | ICD-10-CM | POA: Diagnosis not present

## 2024-03-19 NOTE — Progress Notes (Signed)
 Kathryn [name] returns today for follow-up of the wound on the anterior surface of her right leg and her sacral decubitus.  Both wounds are healing albeit slowly.  She does have a significant amount of edema in both lower extremities.  She was started on Lasix  by her cardiologist.  I suspect that this will help with the wound healing.  Continue dressing changes and continue offloading the pressure ulcer.  Follow-up in 2 weeks sooner if needed

## 2024-03-22 ENCOUNTER — Ambulatory Visit: Payer: Self-pay | Admitting: Cardiovascular Disease

## 2024-03-30 ENCOUNTER — Telehealth: Payer: Self-pay | Admitting: Cardiovascular Disease

## 2024-03-30 NOTE — Telephone Encounter (Signed)
 Spoke with the patient and reviewed medications with her. She states that she has been taking carvedilol  6.25 mg twice daily and Lasix  40 mg daily. Advised that she should be taking carvedilol  12.5 mg twice daily and Lasix  20 mg once daily. Patient verbalized understanding. Advised patient to monitor to her heart rate and blood pressure and let us  know if she has any concerns.

## 2024-03-30 NOTE — Telephone Encounter (Signed)
 Pt was in rehab and is since release. She is confused about her medications. She would like someone to go over them

## 2024-04-02 ENCOUNTER — Ambulatory Visit (INDEPENDENT_AMBULATORY_CARE_PROVIDER_SITE_OTHER): Admitting: Surgical

## 2024-04-02 VITALS — BP 144/83 | HR 70

## 2024-04-02 DIAGNOSIS — S81801D Unspecified open wound, right lower leg, subsequent encounter: Secondary | ICD-10-CM | POA: Diagnosis not present

## 2024-04-02 DIAGNOSIS — L89159 Pressure ulcer of sacral region, unspecified stage: Secondary | ICD-10-CM | POA: Diagnosis not present

## 2024-04-02 DIAGNOSIS — L89152 Pressure ulcer of sacral region, stage 2: Secondary | ICD-10-CM
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
## 2024-04-16 ENCOUNTER — Ambulatory Visit (INDEPENDENT_AMBULATORY_CARE_PROVIDER_SITE_OTHER): Admitting: Surgical

## 2024-04-16 DIAGNOSIS — S81801D Unspecified open wound, right lower leg, subsequent encounter: Secondary | ICD-10-CM | POA: Diagnosis not present

## 2024-04-16 DIAGNOSIS — M7989 Other specified soft tissue disorders: Secondary | ICD-10-CM | POA: Diagnosis not present

## 2024-04-16 DIAGNOSIS — L89152 Pressure ulcer of sacral region, stage 2: Secondary | ICD-10-CM

## 2024-04-16 NOTE — Progress Notes (Signed)
   Referring Provider Shayne Anes, MD [address] [place],  [place] [address]   CC: No chief complaint on file.     Kathryn [name] is an 89 y.o. female.  HPI: Patient is a 89 year old female here for follow-up on a right lower extremity wound and a sacral ulcer.  She has been managing the right lower extremity wound with Xeroform dressing changes 2-3 times per week.  She has had minimal help at home, reports home health sees her weekly.  We have discussed dressing changes every other day, but they have reported inability to do this regularly due to lack of assistance.  She reports her sacral ulcer is doing much better, she has been sitting on a foam pad which has been very helpful.  She has been ambulatory at home without issue.  She is not having any infectious symptoms.  She has not seen her PCP.  She is here with her son and he reports that when she had her last PCP appointment scheduled she did not go because she was not feeling well.  She is continuing to have bilateral lower extremity swelling, left greater than right.  She has been taking her fluid pills, but has not been elevating her legs enough.  She not report any complaints of pain or any other concerns related to her wounds.  Review of Systems General: No fevers or chills  Physical Exam    04/02/2024    2:14 PM 03/19/2024    3:32 PM 02/20/2024    4:31 PM  Vitals with BMI  Systolic 144 171 828  Diastolic 83 93 90  Pulse 70 76 70    General:  No acute distress,  Alert and oriented, Non-Toxic, Normal speech and affect Right lower extremity wound is 9.5 x 5.5 x 0.1 cm.  There is new epithelialization noted surrounding the wound bed.  She does have some Xeroform residue present on the right lower extremity.  There is no erythema or cellulitic changes.  She does have some mild swelling of the right lower extremity.  She does have pitting edema of the left lower extremity.  No open wounds noted of the left lower  extremity.  Sacrum: Ulcer of sacrum has completely healed, there is no open wound noted.  There is still some mild redness present from pressure.  Assessment/Plan Patient is a 89 year old female here for follow-up on her chronic lower extremity wound and sacral ulcer.  Sacral ulcer has completely healed.   Continue with Xeroform dressing changes at a minimum every other day to right lower extremity.  Cover this with 4 x 4 gauze, ABD pad, Kerlix and Ace wrap.  Continue to offload sacrum with foam padding when wheelchair-bound and ambulate as tolerated.  Discussed with patient and her son the importance of following up with PCP for ongoing management and evaluation of bilateral lower extremity swelling, left greater than right.  Left lower extremity swelling/pitting edema is quite significant.  I do not appreciate any signs of DVT and swelling has been ongoing.  Patient is agreeable to reach out to her PCP to schedule an appointment.  Picture taken and placed in patient's chart with patient's permission.  Kathryn [name] 04/16/2024, 2:02 PM

## 2024-04-18 ENCOUNTER — Other Ambulatory Visit: Payer: Self-pay

## 2024-04-18 ENCOUNTER — Emergency Department (HOSPITAL_COMMUNITY)

## 2024-04-18 ENCOUNTER — Encounter (HOSPITAL_COMMUNITY): Payer: Self-pay | Admitting: *Deleted

## 2024-04-18 ENCOUNTER — Inpatient Hospital Stay (HOSPITAL_COMMUNITY)
Admission: EM | Admit: 2024-04-18 | Discharge: 2024-04-29 | DRG: 199 | Disposition: A | Discharge status: Hospice | Attending: Surgery | Admitting: Surgery

## 2024-04-18 DIAGNOSIS — S81801A Unspecified open wound, right lower leg, initial encounter: Secondary | ICD-10-CM | POA: Diagnosis present

## 2024-04-18 DIAGNOSIS — D631 Anemia in chronic kidney disease: Secondary | ICD-10-CM | POA: Diagnosis present

## 2024-04-18 DIAGNOSIS — I130 Hypertensive heart and chronic kidney disease with heart failure and stage 1 through stage 4 chronic kidney disease, or unspecified chronic kidney disease: Secondary | ICD-10-CM | POA: Diagnosis present

## 2024-04-18 DIAGNOSIS — E871 Hypo-osmolality and hyponatremia: Secondary | ICD-10-CM | POA: Diagnosis present

## 2024-04-18 DIAGNOSIS — S270XXA Traumatic pneumothorax, initial encounter: Secondary | ICD-10-CM | POA: Diagnosis present

## 2024-04-18 DIAGNOSIS — N1831 Chronic kidney disease, stage 3a: Secondary | ICD-10-CM | POA: Diagnosis present

## 2024-04-18 DIAGNOSIS — I959 Hypotension, unspecified: Secondary | ICD-10-CM | POA: Diagnosis present

## 2024-04-18 DIAGNOSIS — R54 Age-related physical debility: Secondary | ICD-10-CM | POA: Diagnosis present

## 2024-04-18 DIAGNOSIS — I442 Atrioventricular block, complete: Secondary | ICD-10-CM | POA: Diagnosis present

## 2024-04-18 DIAGNOSIS — Z681 Body mass index (BMI) 19 or less, adult: Secondary | ICD-10-CM | POA: Diagnosis not present

## 2024-04-18 DIAGNOSIS — Z515 Encounter for palliative care: Secondary | ICD-10-CM

## 2024-04-18 DIAGNOSIS — W01190A Fall on same level from slipping, tripping and stumbling with subsequent striking against furniture, initial encounter: Secondary | ICD-10-CM | POA: Diagnosis present

## 2024-04-18 DIAGNOSIS — N179 Acute kidney failure, unspecified: Secondary | ICD-10-CM | POA: Diagnosis present

## 2024-04-18 DIAGNOSIS — Z86718 Personal history of other venous thrombosis and embolism: Secondary | ICD-10-CM

## 2024-04-18 DIAGNOSIS — Z950 Presence of cardiac pacemaker: Secondary | ICD-10-CM

## 2024-04-18 DIAGNOSIS — N1411 Contrast-induced nephropathy: Secondary | ICD-10-CM | POA: Diagnosis present

## 2024-04-18 DIAGNOSIS — I5032 Chronic diastolic (congestive) heart failure: Secondary | ICD-10-CM | POA: Diagnosis present

## 2024-04-18 DIAGNOSIS — Z881 Allergy status to other antibiotic agents status: Secondary | ICD-10-CM

## 2024-04-18 DIAGNOSIS — Z79899 Other long term (current) drug therapy: Secondary | ICD-10-CM

## 2024-04-18 DIAGNOSIS — R627 Adult failure to thrive: Secondary | ICD-10-CM | POA: Diagnosis present

## 2024-04-18 DIAGNOSIS — R339 Retention of urine, unspecified: Secondary | ICD-10-CM | POA: Diagnosis present

## 2024-04-18 DIAGNOSIS — I480 Paroxysmal atrial fibrillation: Secondary | ICD-10-CM | POA: Diagnosis present

## 2024-04-18 DIAGNOSIS — I739 Peripheral vascular disease, unspecified: Secondary | ICD-10-CM | POA: Diagnosis present

## 2024-04-18 DIAGNOSIS — E861 Hypovolemia: Secondary | ICD-10-CM | POA: Diagnosis present

## 2024-04-18 DIAGNOSIS — Z7189 Other specified counseling: Secondary | ICD-10-CM | POA: Diagnosis not present

## 2024-04-18 DIAGNOSIS — T1490XA Injury, unspecified, initial encounter: Secondary | ICD-10-CM | POA: Diagnosis present

## 2024-04-18 DIAGNOSIS — Z8249 Family history of ischemic heart disease and other diseases of the circulatory system: Secondary | ICD-10-CM

## 2024-04-18 DIAGNOSIS — Z66 Do not resuscitate: Secondary | ICD-10-CM | POA: Diagnosis not present

## 2024-04-18 DIAGNOSIS — E872 Acidosis, unspecified: Secondary | ICD-10-CM | POA: Diagnosis present

## 2024-04-18 DIAGNOSIS — Y92009 Unspecified place in unspecified non-institutional (private) residence as the place of occurrence of the external cause: Secondary | ICD-10-CM

## 2024-04-18 DIAGNOSIS — S2242XA Multiple fractures of ribs, left side, initial encounter for closed fracture: Secondary | ICD-10-CM | POA: Diagnosis present

## 2024-04-18 DIAGNOSIS — E785 Hyperlipidemia, unspecified: Secondary | ICD-10-CM | POA: Diagnosis present

## 2024-04-18 DIAGNOSIS — E43 Unspecified severe protein-calorie malnutrition: Secondary | ICD-10-CM | POA: Diagnosis present

## 2024-04-18 DIAGNOSIS — Z888 Allergy status to other drugs, medicaments and biological substances status: Secondary | ICD-10-CM

## 2024-04-18 DIAGNOSIS — Z7982 Long term (current) use of aspirin: Secondary | ICD-10-CM

## 2024-04-18 DIAGNOSIS — Z885 Allergy status to narcotic agent status: Secondary | ICD-10-CM

## 2024-04-18 LAB — CBC WITH DIFFERENTIAL/PLATELET
Abs Immature Granulocytes: 0.1 K/uL — ABNORMAL HIGH (ref 0.00–0.07)
Basophils Absolute: 0 K/uL (ref 0.0–0.1)
Basophils Relative: 0 %
Eosinophils Absolute: 0 K/uL (ref 0.0–0.5)
Eosinophils Relative: 0 %
HCT: 31.5 % — ABNORMAL LOW (ref 36.0–46.0)
Hemoglobin: 10.1 g/dL — ABNORMAL LOW (ref 12.0–15.0)
Immature Granulocytes: 1 %
Lymphocytes Relative: 4 %
Lymphs Abs: 0.6 K/uL — ABNORMAL LOW (ref 0.7–4.0)
MCH: 30 pg (ref 26.0–34.0)
MCHC: 32.1 g/dL (ref 30.0–36.0)
MCV: 93.5 fL (ref 80.0–100.0)
Monocytes Absolute: 1.2 K/uL — ABNORMAL HIGH (ref 0.1–1.0)
Monocytes Relative: 8 %
Neutro Abs: 13.6 K/uL — ABNORMAL HIGH (ref 1.7–7.7)
Neutrophils Relative %: 87 %
Platelets: 257 K/uL (ref 150–400)
RBC: 3.37 MIL/uL — ABNORMAL LOW (ref 3.87–5.11)
RDW: 13.3 % (ref 11.5–15.5)
WBC: 15.5 K/uL — ABNORMAL HIGH (ref 4.0–10.5)
nRBC: 0 % (ref 0.0–0.2)

## 2024-04-18 LAB — CBC
HCT: 27.8 % — ABNORMAL LOW (ref 36.0–46.0)
Hemoglobin: 9.1 g/dL — ABNORMAL LOW (ref 12.0–15.0)
MCH: 30.3 pg (ref 26.0–34.0)
MCHC: 32.7 g/dL (ref 30.0–36.0)
MCV: 92.7 fL (ref 80.0–100.0)
Platelets: 212 K/uL (ref 150–400)
RBC: 3 MIL/uL — ABNORMAL LOW (ref 3.87–5.11)
RDW: 13.3 % (ref 11.5–15.5)
WBC: 13.4 K/uL — ABNORMAL HIGH (ref 4.0–10.5)
nRBC: 0 % (ref 0.0–0.2)

## 2024-04-18 LAB — COMPREHENSIVE METABOLIC PANEL WITH GFR
ALT: 15 U/L (ref 0–44)
AST: 27 U/L (ref 15–41)
Albumin: 3.2 g/dL — ABNORMAL LOW (ref 3.5–5.0)
Alkaline Phosphatase: 69 U/L (ref 38–126)
Anion gap: 11 (ref 5–15)
BUN: 26 mg/dL — ABNORMAL HIGH (ref 8–23)
CO2: 23 mmol/L (ref 22–32)
Calcium: 9.2 mg/dL (ref 8.9–10.3)
Chloride: 100 mmol/L (ref 98–111)
Creatinine, Ser: 1.2 mg/dL — ABNORMAL HIGH (ref 0.44–1.00)
GFR, Estimated: 42 mL/min — ABNORMAL LOW (ref >60)
Glucose, Bld: 134 mg/dL — ABNORMAL HIGH (ref 70–99)
Potassium: 4.4 mmol/L (ref 3.5–5.1)
Sodium: 134 mmol/L — ABNORMAL LOW (ref 135–145)
Total Bilirubin: 1.3 mg/dL — ABNORMAL HIGH (ref 0.0–1.2)
Total Protein: 6.6 g/dL (ref 6.5–8.1)

## 2024-04-18 LAB — I-STAT CHEM 8, ED
BUN: 30 mg/dL — ABNORMAL HIGH (ref 8–23)
Calcium, Ion: 1.21 mmol/L (ref 1.15–1.40)
Chloride: 100 mmol/L (ref 98–111)
Creatinine, Ser: 1.3 mg/dL — ABNORMAL HIGH (ref 0.44–1.00)
Glucose, Bld: 136 mg/dL — ABNORMAL HIGH (ref 70–99)
HCT: 31 % — ABNORMAL LOW (ref 36.0–46.0)
Hemoglobin: 10.5 g/dL — ABNORMAL LOW (ref 12.0–15.0)
Potassium: 4.4 mmol/L (ref 3.5–5.1)
Sodium: 136 mmol/L (ref 135–145)
TCO2: 25 mmol/L (ref 22–32)

## 2024-04-18 LAB — LIPASE, BLOOD: Lipase: 34 U/L (ref 11–51)

## 2024-04-18 LAB — CREATININE, SERUM
Creatinine, Ser: 1.17 mg/dL — ABNORMAL HIGH (ref 0.44–1.00)
GFR, Estimated: 44 mL/min — ABNORMAL LOW (ref >60)

## 2024-04-18 MED ORDER — DOCUSATE SODIUM 100 MG PO CAPS
100.0000 mg | ORAL_CAPSULE | Freq: Two times a day (BID) | ORAL | Status: DC
  Administered 2024-04-18 – 2024-04-29 (×20): 100 mg via ORAL
  Filled 2024-04-18 (×22): qty 1

## 2024-04-18 MED ORDER — HYDRALAZINE HCL 20 MG/ML IJ SOLN
10.0000 mg | INTRAMUSCULAR | Status: DC | PRN
  Administered 2024-04-27: 10 mg via INTRAVENOUS
  Filled 2024-04-18 (×2): qty 1

## 2024-04-18 MED ORDER — MORPHINE SULFATE (PF) 2 MG/ML IV SOLN
1.0000 mg | INTRAVENOUS | Status: DC | PRN
  Administered 2024-04-18: 1 mg via INTRAVENOUS
  Filled 2024-04-18: qty 1

## 2024-04-18 MED ORDER — METHOCARBAMOL 500 MG PO TABS
500.0000 mg | ORAL_TABLET | Freq: Three times a day (TID) | ORAL | Status: AC
  Administered 2024-04-18 – 2024-04-20 (×7): 500 mg via ORAL
  Filled 2024-04-18 (×8): qty 1

## 2024-04-18 MED ORDER — FENTANYL CITRATE (PF) 50 MCG/ML IJ SOSY
50.0000 ug | PREFILLED_SYRINGE | Freq: Once | INTRAMUSCULAR | Status: AC
  Administered 2024-04-18: 50 ug via INTRAVENOUS
  Filled 2024-04-18: qty 1

## 2024-04-18 MED ORDER — ONDANSETRON HCL 4 MG/2ML IJ SOLN: 4.0000 mg | Freq: Four times a day (QID) | INTRAMUSCULAR | Status: DC | PRN

## 2024-04-18 MED ORDER — POLYETHYLENE GLYCOL 3350 17 G PO PACK
17.0000 g | PACK | Freq: Every day | ORAL | Status: DC | PRN
  Administered 2024-04-22: 17 g via ORAL
  Filled 2024-04-18: qty 1

## 2024-04-18 MED ORDER — METOPROLOL TARTRATE 5 MG/5ML IV SOLN: 5.0000 mg | Freq: Four times a day (QID) | INTRAVENOUS | Status: DC | PRN

## 2024-04-18 MED ORDER — METHOCARBAMOL 1000 MG/10ML IJ SOLN
500.0000 mg | Freq: Three times a day (TID) | INTRAMUSCULAR | Status: AC
  Administered 2024-04-18: 500 mg via INTRAVENOUS
  Filled 2024-04-18: qty 10

## 2024-04-18 MED ORDER — ACETAMINOPHEN 500 MG PO TABS
1000.0000 mg | ORAL_TABLET | Freq: Four times a day (QID) | ORAL | Status: DC
  Administered 2024-04-18 – 2024-04-29 (×28): 1000 mg via ORAL
  Filled 2024-04-18 (×37): qty 2

## 2024-04-18 MED ORDER — ONDANSETRON 4 MG PO TBDP
4.0000 mg | ORAL_TABLET | Freq: Four times a day (QID) | ORAL | Status: DC | PRN
  Administered 2024-04-20: 4 mg via ORAL
  Filled 2024-04-18: qty 1

## 2024-04-18 MED ORDER — TRAMADOL HCL 50 MG PO TABS
25.0000 mg | ORAL_TABLET | Freq: Four times a day (QID) | ORAL | Status: DC | PRN
  Administered 2024-04-18 – 2024-04-25 (×13): 25 mg via ORAL
  Filled 2024-04-18 (×14): qty 1

## 2024-04-18 MED ORDER — HYDROMORPHONE HCL 1 MG/ML IJ SOLN
0.5000 mg | Freq: Once | INTRAMUSCULAR | Status: AC
  Administered 2024-04-18: 0.5 mg via INTRAVENOUS
  Filled 2024-04-18: qty 1

## 2024-04-18 MED ORDER — ENOXAPARIN SODIUM 30 MG/0.3ML IJ SOSY
30.0000 mg | PREFILLED_SYRINGE | INTRAMUSCULAR | Status: DC
  Administered 2024-04-19 – 2024-04-21 (×3): 30 mg via SUBCUTANEOUS
  Filled 2024-04-18 (×3): qty 0.3

## 2024-04-18 MED ORDER — IOHEXOL 350 MG/ML SOLN
75.0000 mL | Freq: Once | INTRAVENOUS | Status: AC | PRN
  Administered 2024-04-18: 75 mL via INTRAVENOUS

## 2024-04-18 NOTE — ED Triage Notes (Signed)
 Patient presents  to ed via [facility] from home states she was carrying a tray with coffee on it and she tripped and hit her left side on the rocker, c/o left lateral rib pain pain is worse with movement.

## 2024-04-18 NOTE — Progress Notes (Signed)
 Son: Darina is 20 minutes away and would like a call if anything is concerning.   [phone]

## 2024-04-18 NOTE — Consult Note (Addendum)
 WOC Nurse Consult Note: Reason for Consult: requested to assess RLE wound. Unna boot requested. Performed remote evaluating photos and notes. Pt is follow by home health care. Wound type: Full thickness right lower extremity wound, venous insufficiency? Pressure Injury POA: NA Measurement: 9.5 x 5.5 x 0.1 cm (see flowsheet) Wound bed: 80% red, 20% yellow. Drainage (amount, consistency, odor) Minimum amount. Periwound: edema bilateral leg, edges macerated 9 to 12 o'clock. Dressing procedure/placement/frequency: Cleanse the leg with warm water and wash cloth to remove residuals of the last unna boot. Pat dry. Cleanse the wound with saline, pat dry the peri-wound skin. Apply Aquacel [id number] into the wound bed, cover with gauze. Apply unna boot by ortho tech twice a week. Order in place.  WOC team will not plan to follow further. Please reconsult if further assistance is needed. Thank-you,  Lela Holm RN, CNS, [employer], MSN.  (Phone [phone])

## 2024-04-18 NOTE — ED Provider Notes (Signed)
 [place] EMERGENCY DEPARTMENT AT [facility] Provider Note   CSN: [id number] Arrival date & time: 04/18/24  1203     Patient presents with: Kathryn [name] is a 89 y.o. female with paroxysmal A-fib not on anticoagulation, pacemaker, hypertension, chronic kidney disease stage IIIa presenting to emergency room after fall.  Patient notes mechanical fall and tripping onto a rocker.  She fell and landed on her left lateral ribs.  She reports it hurts to breathe and talk.  Pain started after the fall.  She denies any head injury, neck injury.  She is not on blood thinners.  No loss of consciousness, syncope or presyncope.    Fall       Prior to Admission medications   Medication Sig Start Date End Date Taking? Authorizing Provider  acetaminophen  (TYLENOL ) 500 MG tablet Take 1 tablet (500 mg total) by mouth every 6 (six) hours as needed. 02/09/15   Sebastian Lenis, MD  aspirin  EC 81 MG tablet Take 81 mg by mouth every morning.     [provider]  calcium carbonate (SUPER CALCIUM) 1500 (600 Ca) MG TABS tablet Take 1,500 mg by mouth daily with breakfast. 06/02/12   [provider]  carvedilol  (COREG ) 12.5 MG tablet Take 1 tablet (12.5 mg total) by mouth 2 (two) times daily. 08/23/23 01/08/25  Vicci Rollo SAUNDERS, PA-C  cholecalciferol  (VITAMIN D ) 1000 units tablet Take 1,000 Units by mouth daily.    [provider]  CRANBERRY PO Take 1 capsule by mouth daily in the afternoon.    [provider]  doxycycline  (VIBRA -TABS) 100 MG tablet Take 1 tablet (100 mg total) by mouth 2 (two) times daily. 01/10/24   Rashid, Farhan, MD  EVENING PRIMROSE OIL PO Take 1 capsule by mouth every evening.    [provider]  furosemide  (LASIX ) 20 MG tablet Take 1 tablet (20 mg total) by mouth daily. 12/27/23   Fairy Frames, MD  ondansetron  (ZOFRAN -ODT) 4 MG disintegrating tablet Take 4 mg by mouth every 8 (eight) hours as needed for nausea or  vomiting. 10/31/23   [provider]  Polyethyl Glycol-Propyl Glycol (SYSTANE OP) Place 1 drop into both eyes daily as needed (dry eyes).    [provider]  TART CHERRY PO Take 3 capsules by mouth daily in the afternoon.    [provider]  traMADol  (ULTRAM ) 50 MG tablet Take 1 tablet (50 mg total) by mouth every 8 (eight) hours as needed for moderate pain (pain score 4-6) (mild pain). 12/27/23   Fairy Frames, MD    Allergies: Atorvastatin, Darvocet [propoxyphene n-acetaminophen ], Ketamine , Valsartan, Acetaminophen -codeine, Codeine, Percocet [oxycodone-acetaminophen ], Procaine, Epinephrine , Ibandronate, Iron , Levofloxacin, and Proton pump inhibitors    Review of Systems  Musculoskeletal:  Positive for arthralgias.    Updated Vital Signs BP (!) 176/86 (BP Location: Left Arm)   Pulse 70   Temp (!) 97.3 F (36.3 C) (Oral)   Resp 18   Ht 5' 4 (1.626 m)   Wt 52.2 kg   SpO2 100%   BMI 19.74 kg/m   Physical Exam Vitals and nursing note reviewed.  Constitutional:      General: She is not in acute distress.    Appearance: She is not toxic-appearing.  HENT:     Head: Normocephalic and atraumatic.     Ears:     Comments: Head and face appears atraumatic. Eyes:     General: No scleral icterus.    Conjunctiva/sclera: Conjunctivae normal.  Neck:     Comments: No cervical midline tenderness step-off or deformity.  She has full range of motion of her neck.  No radicular symptoms in her upper extremity. Cardiovascular:     Rate and Rhythm: Normal rate and regular rhythm.     Pulses: Normal pulses.     Heart sounds: Normal heart sounds.  Pulmonary:     Effort: Pulmonary effort is normal. No respiratory distress.     Breath sounds: Normal breath sounds.  Abdominal:     General: Abdomen is flat. Bowel sounds are normal.     Palpations: Abdomen is soft.     Tenderness: There is no abdominal tenderness.  Musculoskeletal:       Arms:     Right lower leg: No  edema.     Left lower leg: No edema.  Skin:    General: Skin is warm and dry.     Findings: No lesion.  Neurological:     General: No focal deficit present.     Mental Status: She is alert and oriented to person, place, and time. Mental status is at baseline.     (all labs ordered are listed, but only abnormal results are displayed) Labs Reviewed  CBC WITH DIFFERENTIAL/PLATELET - Abnormal; Notable for the following components:      Result Value   WBC 15.5 (*)    RBC 3.37 (*)    Hemoglobin 10.1 (*)    HCT 31.5 (*)    Neutro Abs 13.6 (*)    Lymphs Abs 0.6 (*)    Monocytes Absolute 1.2 (*)    Abs Immature Granulocytes 0.10 (*)    All other components within normal limits  I-STAT CHEM 8, ED - Abnormal; Notable for the following components:   BUN 30 (*)    Creatinine, Ser 1.30 (*)    Glucose, Bld 136 (*)    Hemoglobin 10.5 (*)    HCT 31.0 (*)    All other components within normal limits  LIPASE, BLOOD  COMPREHENSIVE METABOLIC PANEL WITH GFR    EKG: None  Radiology: DG Ribs Unilateral W/Chest Left Result Date: 04/18/2024 EXAM: AP VIEW XRAY OF THE LEFT RIBS AND CHEST 04/18/2024 01:17:00 PM COMPARISON: 01/08/2024. CLINICAL HISTORY: Fall. Reason for exam: fall. Triage notes: Patient presents to ED via [facility] from home states she was carrying a tray with coffee on it and she tripped and hit her left side on the rocker, c/o left lateral rib pain pain is worse with movement. FINDINGS: BONES: Acute, mildly displaced left 6th, 7th, 8th, and 9th rib fractures. LUNGS AND PLEURA: No consolidation or pulmonary edema. Trace right pleural effusion. Trace left apical pneumothorax. HEART AND MEDIASTINUM: Right chest wall cardiac pacemaker. Atherosclerotic plaque. No acute abnormality of the cardiac and mediastinal silhouettes. SOFT TISSUES: Left hemithorax subcutaneous emphysema. IMPRESSION: 1. Mildly displaced left 6th9th rib fractures. 2. Trace left apical pneumothorax with associated left  hemithorax subcutaneous emphysema. 3. Small right pleural effusion. 4. Critical results were verbally delivered to Rafik Koppel, Warren SAILOR, PA-C at the time of interpretation on 04/18/2024 at 1:45 pm Electronically signed by: Waddell Calk MD 04/18/2024 01:45 PM EDT RP Workstation: [id number]     Procedures   Medications Ordered in the ED  fentaNYL  (SUBLIMAZE ) injection 50 mcg (50 mcg Intravenous Given 04/18/24 1322)    Clinical Course as of 04/18/24 1517  Sat Apr 18, 2024  1514 CANDIE RATHKE.  [WB]    Clinical Course User Index [WB] Dorcus Fallow, MD  Medical Decision Making Amount and/or Complexity of Data Reviewed Labs: ordered. Radiology: ordered.  Risk Prescription drug management.   This patient presents to the ED for concern of fall, this involves an extensive number of treatment options, and is a complaint that carries with it a high risk of complications and morbidity.  The differential diagnosis includes arrhythmia, syncope, dehydration, electrolyte abnormality   Co morbidities that complicate the patient evaluation  Paroxysmal A-fib not on anticoagulation, pacemaker, hypertension, chronic kidney disease stage IIIa   Lab Tests:  I personally interpreted labs.  The pertinent results include:   CBC, CMP, lipase ordered.   Imaging Studies ordered:  I ordered imaging studies including chest x-ray shows 4 rib fractures and small apical pneumothorax.  I did order CT scan of chest abdomen pelvis to for further workup.  Added on CT head and cervical spine.   Cardiac Monitoring: / EKG:  The patient was maintained on a cardiac monitor.      Problem List / ED Course / Critical interventions / Medication management  Patient reports after mechanical fall.  She has no associated head injury.  She denies any head or neck pain.  She has normal neurological exam.  She is moving extremities without difficulty.  She primarily localizes pain to left lower  chest which started after the fall.  She does have mild reproducible tenderness to upper left abdomen but she has significant tenderness to left lateral chest wall.  Her x-ray shows multiple rib fractures with small pneumothorax.  I will get a CT of the chest abdomen and pelvis for further workup. I ordered medication including fentanyl  for pain control.  Reevaluation of the patient after these medicines showed that the patient improved I have reviewed the patients home medicines and have made adjustments as needed. I anticipate given patient's significant symptoms as well as comorbidities she will require admission for pain control.  Patient signed off to oncoming provider pending her CT imaging and for admission.        Final diagnoses:  Closed fracture of multiple ribs of left side, initial encounter    ED Discharge Orders     None          Shermon Warren SAILOR, PA-C 04/18/24 1518    Tegeler, Lonni PARAS, MD 04/19/24 1544

## 2024-04-18 NOTE — Progress Notes (Signed)
 Wound ostomy consult requested for evaluation and orders, RLE UNA boot present on assessment.   Patient requested dressing remain in place, per Home care orders.

## 2024-04-18 NOTE — H&P (Signed)
 Admitting Physician: Kathryn [name]  Service: Trauma Surgery  CC: GLF, multiple rib fractures  Subjective   Mechanism of Injury: Kathryn [name] is an 89 y.o. female who presented as a non-leveled trauma and she experienced GLF. She reports that she lost her balance and fell backward onto her left side, striking a rocking chair.. Initial evaluation performed by EDP. She arrived in stable condition.   Of note, patient was admitted recently with a right leg laceration related to another fall. She lives by herself.   Past Medical History:  Diagnosis Date   Arthritis    HANDS   Bradycardia    CKD stage 3a, GFR 45-59 ml/min (HCC) 12/24/2023   DVT (deep venous thrombosis) (HCC) 2010   Dysrhythmia    HX OF PAROXYSMAL ATRIAL FIB- TAKES ASPIRIN  FOR BLOOD THINNER   GERD (gastroesophageal reflux disease)    Hypertension    Peripheral vascular disease    Blood clot behind Right knee   Presence of permanent cardiac pacemaker    COMPLETE HEART BLOCK--DR. CROITORU     Past Surgical History:  Procedure Laterality Date   ABDOMINAL HYSTERECTOMY     APPENDECTOMY     AUGMENTATION MAMMAPLASTY Bilateral    BREAST ENHANCEMENT SURGERY     BREAST IMPLANT REMOVAL Bilateral 08/28/2017   Procedure: REMOVAL BREAST IMPLANTS BILATERAL;  Surgeon: Lowery Estefana RAMAN, DO;  Location: [facility];  Service: Plastics;  Laterality: Bilateral;   CATARACT EXTRACTION, BILATERAL     CHOLECYSTECTOMY     COLONOSCOPY N/A 03/05/2014   Procedure: COLONOSCOPY;  Surgeon: Alm VEAR Angle, MD;  Location: [facility];  Service: General;  Laterality: N/A;   COLOSTOMY CLOSURE N/A 05/21/2014   Procedure: LAPAROSCOPIC ASSISTED CLOSURE OF COLOSTOMY AND REPAIR OF TRANSVERSE COLON AND ENTEROTOMY;  Surgeon: Alm Angle, MD;  Location: [facility];  Service: General;  Laterality: N/A;   EP IMPLANTABLE DEVICE N/A 10/04/2015   Procedure: PPM/BIV PPM Generator Changeout;  Surgeon: Jerel Balding, MD;  Location:  [facility];  Service: Cardiovascular;  Laterality: N/A;   LAPAROTOMY N/A 08/27/2013   Procedure: LAPAROSCOPY CONVERTED TO OPEN LAPAROTOMY WITH SIGMOID COLECTOMY AND COLOSTOMY;  Surgeon: Alm VEAR Angle, MD;  Location: [facility];  Service: General;  Laterality: N/A;   OPEN REDUCTION INTERNAL FIXATION (ORIF) DISTAL RADIAL FRACTURE Left 02/15/2015   Procedure: OPEN TREATMENT OF LEFT DISTAL RADIUS FRACTURE;  Surgeon: Alm Hummer, MD;  Location: [facility];  Service: Orthopedics;  Laterality: Left;   PACEMAKER INSERTION  12/02/1998   medtronic   PERMANENT PACEMAKER GENERATOR CHANGE  02/28/2007   Medtronic Adapta   TONSILLECTOMY     US  ECHOCARDIOGRAPHY  10/09/2011   mild LAE, mild MR,mild to mod TR    Family History  Problem Relation Age of Onset   Heart disease Mother    Heart disease Father    Breast cancer Neg Hx     Social:  reports that she has never smoked. She has never used smokeless tobacco. She reports that she does not drink alcohol and does not use drugs.  Allergies:  Allergies  Allergen Reactions   Atorvastatin Other (See Comments)    Myalgia   Darvocet [Propoxyphene N-Acetaminophen ] Other (See Comments)    Hallucination    Ketamine  Other (See Comments)    Altered mental status, hallucinations   Valsartan Other (See Comments)    Dizziness, unsteady gait   Acetaminophen -Codeine Other (See Comments)    Reaction type/severity unknown   Codeine Other (See Comments)  Reaction type/severity unknown    Percocet [Oxycodone-Acetaminophen ] Other (See Comments)    Hallucinations    Procaine Other (See Comments)    Reaction type/severity unknown    Epinephrine  Palpitations and Other (See Comments)    Severe increase in heart rate (has pacemaker)   Ibandronate Other (See Comments)    Difficulty swallowing, concerns about side-effects   Iron  Diarrhea and Nausea Only   Levofloxacin Other (See Comments)    Other reaction(s): shoulder pain. Patient  has successfully taken ciprofloxacin   Proton Pump Inhibitors Diarrhea    Medications: Current Outpatient Medications  Medication Instructions   acetaminophen  (TYLENOL ) 500 mg, Oral, Every 6 hours PRN   aspirin  EC 81 mg, Every morning   calcium carbonate (SUPER CALCIUM) 1,500 mg, Daily with breakfast   carvedilol  (COREG ) 12.5 mg, Oral, 2 times daily   cholecalciferol  (VITAMIN D ) 1,000 Units, Daily   CRANBERRY PO 1 capsule, Daily   doxycycline  (VIBRA -TABS) 100 mg, Oral, 2 times daily   EVENING PRIMROSE OIL PO 1 capsule, Every evening   furosemide  (LASIX ) 20 mg, Oral, Daily   ondansetron  (ZOFRAN -ODT) 4 mg, Every 8 hours PRN   Polyethyl Glycol-Propyl Glycol (SYSTANE OP) 1 drop, Daily PRN   TART CHERRY PO 3 capsules, Daily   traMADol  (ULTRAM ) 50 mg, Oral, Every 8 hours PRN    Objective   Primary Survey: Performed by EDP Blood pressure (!) 167/76, pulse 70, temperature 97.9 F (36.6 C), temperature source Oral, resp. rate 18, height 5' 4 (1.626 m), weight 52.2 kg, SpO2 100%.  Secondary Survey: Head: Normocephalic, atraumatic Neck: Full range of motion without pain, no midline tenderness Chest: TTP along the left chest Abdomen: Soft, non-tender, non-distended Upper Extremities: Strength and sensation intact, palpable peripheral pulses Lower extremities: Right leg wrapped, dressing clean and dry, bilateral edema Back: No step offs or deformities, atraumatic Rectal: Deferred Psych: Normal mood and affect  Results for orders placed or performed during the hospital encounter of 04/18/24 (from the past 24 hours)  CBC with Differential     Status: Abnormal   Collection Time: 04/18/24  2:10 PM  Result Value Ref Range   WBC 15.5 (H) 4.0 - 10.5 K/uL   RBC 3.37 (L) 3.87 - 5.11 MIL/uL   Hemoglobin 10.1 (L) 12.0 - 15.0 g/dL   HCT 68.4 (L) 63.9 - 53.9 %   MCV 93.5 80.0 - 100.0 fL   MCH 30.0 26.0 - 34.0 pg   MCHC 32.1 30.0 - 36.0 g/dL   RDW 86.6 88.4 - 84.4 %   Platelets 257 150 - 400  K/uL   nRBC 0.0 0.0 - 0.2 %   Neutrophils Relative % 87 %   Neutro Abs 13.6 (H) 1.7 - 7.7 K/uL   Lymphocytes Relative 4 %   Lymphs Abs 0.6 (L) 0.7 - 4.0 K/uL   Monocytes Relative 8 %   Monocytes Absolute 1.2 (H) 0.1 - 1.0 K/uL   Eosinophils Relative 0 %   Eosinophils Absolute 0.0 0.0 - 0.5 K/uL   Basophils Relative 0 %   Basophils Absolute 0.0 0.0 - 0.1 K/uL   Immature Granulocytes 1 %   Abs Immature Granulocytes 0.10 (H) 0.00 - 0.07 K/uL  Lipase, blood     Status: None   Collection Time: 04/18/24  2:10 PM  Result Value Ref Range   Lipase 34 11 - 51 U/L  I-stat chem 8, ED (not at [facility], [facility] or [facility])     Status: Abnormal   Collection Time: 04/18/24  2:15  PM  Result Value Ref Range   Sodium 136 135 - 145 mmol/L   Potassium 4.4 3.5 - 5.1 mmol/L   Chloride 100 98 - 111 mmol/L   BUN 30 (H) 8 - 23 mg/dL   Creatinine, Ser 8.69 (H) 0.44 - 1.00 mg/dL   Glucose, Bld 863 (H) 70 - 99 mg/dL   Calcium, Ion 8.78 8.84 - 1.40 mmol/L   TCO2 25 22 - 32 mmol/L   Hemoglobin 10.5 (L) 12.0 - 15.0 g/dL   HCT 68.9 (L) 63.9 - 53.9 %     Imaging Orders         DG Ribs Unilateral W/Chest Left         CT CHEST ABDOMEN PELVIS W CONTRAST         CT Head Wo Contrast         CT Cervical Spine Wo Contrast         CT T-SPINE NO CHARGE         CT L-SPINE NO CHARGE      Assessment and Plan   Kathryn [name] is an 89 y.o. female who presented as a non-leveled trauma after a GLF.  L 6-11 rib fx w/ displacement and small PTX - multimodal pain control, pulmonary toilet, repeat CXR in AM  FEN - Regular VTE - Lovenox and Sequential Compression Devices ID - None  Dispo - Step-down unit  Kathryn DELENA Idler, MD  [facility] Use [email or web address] to contact on call provider

## 2024-04-18 NOTE — ED Notes (Signed)
 Pt made RN aware of need for sacral dressing to cover healed pressure wound, as well as home health nurse appt to wrap leg typically on Tuesdays. Documenting for next RN.

## 2024-04-19 ENCOUNTER — Inpatient Hospital Stay (HOSPITAL_COMMUNITY)

## 2024-04-19 LAB — BASIC METABOLIC PANEL WITH GFR
Anion gap: 9 (ref 5–15)
BUN: 27 mg/dL — ABNORMAL HIGH (ref 8–23)
CO2: 25 mmol/L (ref 22–32)
Calcium: 8.7 mg/dL — ABNORMAL LOW (ref 8.9–10.3)
Chloride: 101 mmol/L (ref 98–111)
Creatinine, Ser: 1.13 mg/dL — ABNORMAL HIGH (ref 0.44–1.00)
GFR, Estimated: 45 mL/min — ABNORMAL LOW (ref >60)
Glucose, Bld: 105 mg/dL — ABNORMAL HIGH (ref 70–99)
Potassium: 4 mmol/L (ref 3.5–5.1)
Sodium: 135 mmol/L (ref 135–145)

## 2024-04-19 LAB — CBC
HCT: 24.6 % — ABNORMAL LOW (ref 36.0–46.0)
Hemoglobin: 8.1 g/dL — ABNORMAL LOW (ref 12.0–15.0)
MCH: 30 pg (ref 26.0–34.0)
MCHC: 32.9 g/dL (ref 30.0–36.0)
MCV: 91.1 fL (ref 80.0–100.0)
Platelets: 185 K/uL (ref 150–400)
RBC: 2.7 MIL/uL — ABNORMAL LOW (ref 3.87–5.11)
RDW: 13.6 % (ref 11.5–15.5)
WBC: 9.6 K/uL (ref 4.0–10.5)
nRBC: 0 % (ref 0.0–0.2)

## 2024-04-19 MED ORDER — CARVEDILOL 12.5 MG PO TABS
12.5000 mg | ORAL_TABLET | Freq: Two times a day (BID) | ORAL | Status: DC
  Administered 2024-04-19 – 2024-04-20 (×4): 12.5 mg via ORAL
  Filled 2024-04-19 (×5): qty 1

## 2024-04-19 NOTE — Evaluation (Signed)
 Physical Therapy Evaluation Patient Details Name: Kathryn [name] MRN: [id number] DOB: 05-03-1928 Today's Date: 04/19/2024  History of Present Illness  89 y.o. female admitted 10/11 after sustaining a GLF resulting in L 6-11 rib fx w/ displacement and small PTX.  PMH paroxysmal A-fib not on anticoagulation, pacemaker, hypertension, chronic kidney disease stage IIIa  Clinical Impression  Pt admitted with above diagnosis. Previously independent but has hx of falls this year resulting in injuries. Denies prodromal symptoms, states she merely lost her footing and fell backwards while turning near her chair. She required up to mod assist for bed mobility today, min assist to transfer, and CGA to ambulate short distance with RW (limited by urinary incontinence in standing.) Due to hx of falls, current injury, reduced caregiver support available at home, and increased needs for functional mobility patient would benefit from continued inpatient follow up therapy, <3 hours/day. Will progress acutely as tolerated. Pt currently with functional limitations due to the deficits listed below (see PT Problem List). Pt will benefit from acute skilled PT to increase their independence and safety with mobility to allow discharge.           If plan is discharge home, recommend the following: A little help with walking and/or transfers;A little help with bathing/dressing/bathroom;Assistance with cooking/housework;Assist for transportation;Help with stairs or ramp for entrance   Can travel by private vehicle   Yes    Equipment Recommendations None recommended by PT  Recommendations for Other Services       Functional Status Assessment Patient has had a recent decline in their functional status and demonstrates the ability to make significant improvements in function in a reasonable and predictable amount of time.     Precautions / Restrictions Precautions Precautions: Fall Recall of  Precautions/Restrictions: Intact Precaution/Restrictions Comments: Monitor O2. Restrictions Weight Bearing Restrictions Per Provider Order: No      Mobility  Bed Mobility Overal bed mobility: Needs Assistance Bed Mobility: Rolling, Sidelying to Sit, Sit to Supine Rolling: Min assist Sidelying to sit: Min assist, HOB elevated   Sit to supine: Mod assist   General bed mobility comments: Min assist to roll towards Rt side and rise to EOB. Increased pain with transitions. Mod assist for LE and trunk support using bed pad and helicopter technique to return to supine for comfort. Holding pillow to brace Lt flank.    Transfers Overall transfer level: Needs assistance Equipment used: Rolling walker (2 wheels) Transfers: Sit to/from Stand Sit to Stand: Min assist           General transfer comment: Min assist for boost to stand, stable upon rising with light UE support on RW.    Ambulation/Gait Ambulation/Gait assistance: Contact guard assist Gait Distance (Feet): 10 Feet Assistive device: Rolling walker (2 wheels) Gait Pattern/deviations: Step-through pattern, Decreased stride length, Trunk flexed Gait velocity: dec Gait velocity interpretation: <1.31 ft/sec, indicative of household ambulator   General Gait Details: Grossly stable with RW for support, slow and guarded, close CGA for safety. Intermittently stopping due to pain in lt flank but no buckling. Distance limited by pt reporting incontinence and requesting purewick remain in place at this time.  Stairs            Wheelchair Mobility     Tilt Bed    Modified Rankin (Stroke Patients Only)       Balance Overall balance assessment: Needs assistance Sitting-balance support: No upper extremity supported, Feet supported Sitting balance-Leahy Scale: Fair     Standing balance support:  Bilateral upper extremity supported Standing balance-Leahy Scale: Poor                               Pertinent  Vitals/Pain Pain Assessment Pain Assessment: Faces Faces Pain Scale: Hurts even more Pain Location: Lt flank Pain Descriptors / Indicators: Aching Pain Intervention(s): Limited activity within patient's tolerance, Monitored during session, Repositioned, Relaxation, Utilized relaxation techniques    Home Living Family/patient expects to be discharged to:: Private residence Living Arrangements: Alone Available Help at Discharge: Family;Available PRN/intermittently Type of Home: House Home Access: Stairs to enter Entrance Stairs-Rails: Right;Left (BIL then halfway up on Rt only) Entrance Stairs-Number of Steps: 7   Home Layout: One level (1 step down to [employer]) Home Equipment: Cane - single point;Rollator (4 wheels);Rolling Walker (2 wheels);Shower seat - built in;Grab bars - toilet;Grab bars - tub/shower      Prior Function Prior Level of Function : Independent/Modified Independent             Mobility Comments: Using AD to ambulate at home, does not cook or drive - sons provied meals and take her to appointments. ADLs Comments: able to bath/dress herself, sons provide meals.     Extremity/Trunk Assessment   Upper Extremity Assessment Upper Extremity Assessment: Defer to OT evaluation    Lower Extremity Assessment Lower Extremity Assessment: Generalized weakness       Communication   Communication Communication: No apparent difficulties    Cognition Arousal: Alert Behavior During Therapy: WFL for tasks assessed/performed   PT - Cognitive impairments: No apparent impairments                         Following commands: Intact       Cueing Cueing Techniques: Verbal cues     General Comments General comments (skin integrity, edema, etc.): VSS on 2L supplemental O2 during visit.    Exercises General Exercises - Lower Extremity Ankle Circles/Pumps: AROM, Both, 10 reps, Supine Long Arc Quad: Strengthening, Both, 10 reps, Seated   Assessment/Plan     PT Assessment Patient needs continued PT services  PT Problem List Decreased strength;Decreased range of motion;Decreased activity tolerance;Decreased balance;Decreased mobility;Decreased knowledge of use of DME;Decreased knowledge of precautions;Cardiopulmonary status limiting activity;Pain;Decreased skin integrity       PT Treatment Interventions DME instruction;Gait training;Functional mobility training;Therapeutic activities;Therapeutic exercise;Balance training;Neuromuscular re-education;Patient/family education;Modalities    PT Goals (Current goals can be found in the Care Plan section)  Acute Rehab PT Goals Patient Stated Goal: Get well, reduce pain PT Goal Formulation: With patient Time For Goal Achievement: 05/03/24 Potential to Achieve Goals: Good    Frequency Min 2X/week     Co-evaluation               AM-PAC PT 6 Clicks Mobility  Outcome Measure Help needed turning from your back to your side while in a flat bed without using bedrails?: A Little Help needed moving from lying on your back to sitting on the side of a flat bed without using bedrails?: A Little Help needed moving to and from a bed to a chair (including a wheelchair)?: A Little Help needed standing up from a chair using your arms (e.g., wheelchair or bedside chair)?: A Little Help needed to walk in hospital room?: A Little Help needed climbing 3-5 steps with a railing? : A Lot 6 Click Score: 17    End of Session Equipment Utilized During Treatment: Oxygen  Activity Tolerance: Patient tolerated treatment well (Urinary incontinence with standing.) Patient left: in bed;with call bell/phone within reach;with bed alarm set Nurse Communication:  (NT - 1 person assist) PT Visit Diagnosis: Unsteadiness on feet (R26.81);Other abnormalities of gait and mobility (R26.89);Repeated falls (R29.6);Muscle weakness (generalized) (M62.81);History of falling (Z91.81);Difficulty in walking, not elsewhere classified  (R26.2);Pain Pain - Right/Left: Left Pain - part of body:  (flank)    Time: 8497-8469 PT Time Calculation (min) (ACUTE ONLY): 28 min   Charges:   PT Evaluation $PT Eval Low Complexity: 1 Low PT Treatments $Therapeutic Activity: 8-22 mins PT General Charges $$ ACUTE PT VISIT: 1 Visit         Leontine Roads, PT, DPT [facility]  [facility] Services Physical Therapist Office: [phone] Website: [email or web address]   Leontine GORMAN Roads 04/19/2024, 4:55 PM

## 2024-04-19 NOTE — Progress Notes (Signed)
 Subjective/Chief Complaint: Pt with some pain as expected with deep breaths   Objective: Vital signs in last 24 hours: Temp:  [97.3 F (36.3 C)-97.9 F (36.6 C)] 97.6 F (36.4 C) (10/12 0726) Pulse Rate:  [69-71] 70 (10/12 0726) Resp:  [14-20] 15 (10/12 0726) BP: (99-176)/(48-86) 117/63 (10/12 0726) SpO2:  [97 %-100 %] 100 % (10/12 0726) Weight:  [52.2 kg] 52.2 kg (10/11 1222) Last BM Date : 04/18/24  Intake/Output from previous day: No intake/output data recorded. Intake/Output this shift: No intake/output data recorded.  PE:  Constitutional: No acute distress, conversant, appears states age. Eyes: Anicteric sclerae, moist conjunctiva, no lid lag Lungs: Clear to auscultation bilaterally, normal respiratory effort CV: regular rate and rhythm, no murmurs, no peripheral edema, pedal pulses 2+ GI: Soft, no masses or hepatosplenomegaly, non-tender to palpation Skin: No rashes, palpation reveals normal turgor Psychiatric: appropriate judgment and insight, oriented to person, place, and time   Lab Results:  Recent Labs    04/18/24 1923 04/19/24 0433  WBC 13.4* 9.6  HGB 9.1* 8.1*  HCT 27.8* 24.6*  PLT 212 185   BMET Recent Labs    04/18/24 1410 04/18/24 1415 04/18/24 1923 04/19/24 0433  NA 134* 136  --  135  K 4.4 4.4  --  4.0  CL 100 100  --  101  CO2 23  --   --  25  GLUCOSE 134* 136*  --  105*  BUN 26* 30*  --  27*  CREATININE 1.20* 1.30* 1.17* 1.13*  CALCIUM 9.2  --   --  8.7*   Studies/Results: DG Chest Port 1 View Result Date: 04/19/2024 EXAM: 1 VIEW(S) XRAY OF THE CHEST 04/19/2024 07:20:00 AM COMPARISON: CT chest, abdomen, and pelvis 04/18/2024. CLINICAL HISTORY: 89 year old female. Pneumothorax. FINDINGS: LUNGS AND PLEURA: Increased veiling opacity at both lung bases likely hemothorax on the left and partially loculated simple fluid density effusion on the right by CT yesterday favoring transudate on that side. No significant pneumothorax is  identified. No acute pulmonary edema. No air bronchograms. HEART AND MEDIASTINUM: Stable right chest cardiac pacemaker. Stable mediastinal contours. Calcified aortic atherosclerosis. BONES AND SOFT TISSUES: Displaced left rib fractures and left chest wall posttraumatic subcutaneous gas redemonstrated. IMPRESSION: 1. Displaced left rib fractures with increasing left lung base opacity suspicious for hemothorax or contusion. Atelectasis felt less likely. No Pneumothorax identified. 2. Stable  right pleural effusion, seemed non-traumatic by CT yesterday. 3. Left chest wall subcutaneous emphysema. Electronically signed by: Helayne Hurst MD 04/19/2024 07:28 AM EDT RP Workstation: [id number]   CT T-SPINE NO CHARGE Result Date: 04/18/2024 CLINICAL DATA:  Fall EXAM: CT THORACIC AND LUMBAR SPINE WITHOUT CONTRAST TECHNIQUE: Multidetector CT imaging of the thoracic and lumbar spine was performed without contrast. Multiplanar CT image reconstructions were also generated. RADIATION DOSE REDUCTION: This exam was performed according to the departmental dose-optimization program which includes automated exposure control, adjustment of the mA and/or kV according to patient size and/or use of iterative reconstruction technique. COMPARISON:  None Available. FINDINGS: CT THORACIC SPINE FINDINGS Alignment: Grade 1 anterolisthesis of L4 on L5. Vertebrae: Multilevel moderate degenerative changes spine. No severe osseous neural foraminal or central canal stenosis. No acute fracture or focal pathologic process. Paraspinal and other soft tissues: Negative. Disc levels: Maintained. CT LUMBAR SPINE FINDINGS Segmentation: 5 lumbar type vertebrae. Alignment: Normal. Vertebrae: No acute fracture or focal pathologic process. Paraspinal and other soft tissues: Negative. Disc levels: Maintained. Other: The left 7-11 ribs are fractured in 2 separate places,  lateral and posterior-consistent with a flail chest. Otherwise please see separately dictated  CT chest abdomen pelvis 04/18/2024 for positive findings. IMPRESSION: CT THORACIC SPINE IMPRESSION No acute displaced fracture or traumatic listhesis of the thoracic spine. CT LUMBAR SPINE IMPRESSION No acute displaced fracture or traumatic listhesis of the lumbar spine. The left 7-11 ribs are fractured in 2 separate places, lateral and posterior-consistent with a flail chest. Electronically Signed   By: Morgane  Naveau M.D.   On: 04/18/2024 16:35   CT L-SPINE NO CHARGE Result Date: 04/18/2024 CLINICAL DATA:  Fall EXAM: CT THORACIC AND LUMBAR SPINE WITHOUT CONTRAST TECHNIQUE: Multidetector CT imaging of the thoracic and lumbar spine was performed without contrast. Multiplanar CT image reconstructions were also generated. RADIATION DOSE REDUCTION: This exam was performed according to the departmental dose-optimization program which includes automated exposure control, adjustment of the mA and/or kV according to patient size and/or use of iterative reconstruction technique. COMPARISON:  None Available. FINDINGS: CT THORACIC SPINE FINDINGS Alignment: Grade 1 anterolisthesis of L4 on L5. Vertebrae: Multilevel moderate degenerative changes spine. No severe osseous neural foraminal or central canal stenosis. No acute fracture or focal pathologic process. Paraspinal and other soft tissues: Negative. Disc levels: Maintained. CT LUMBAR SPINE FINDINGS Segmentation: 5 lumbar type vertebrae. Alignment: Normal. Vertebrae: No acute fracture or focal pathologic process. Paraspinal and other soft tissues: Negative. Disc levels: Maintained. Other: The left 7-11 ribs are fractured in 2 separate places, lateral and posterior-consistent with a flail chest. Otherwise please see separately dictated CT chest abdomen pelvis 04/18/2024 for positive findings. IMPRESSION: CT THORACIC SPINE IMPRESSION No acute displaced fracture or traumatic listhesis of the thoracic spine. CT LUMBAR SPINE IMPRESSION No acute displaced fracture or  traumatic listhesis of the lumbar spine. The left 7-11 ribs are fractured in 2 separate places, lateral and posterior-consistent with a flail chest. Electronically Signed   By: Morgane  Naveau M.D.   On: 04/18/2024 16:35   CT CHEST ABDOMEN PELVIS W CONTRAST Addendum Date: 04/18/2024 ADDENDUM REPORT: 04/18/2024 16:34 ADDENDUM: The left 7-11 ribs are fractured in 2 separate places, lateral and posterior-consistent with a flail chest. Electronically Signed   By: Morgane  Naveau M.D.   On: 04/18/2024 16:34   Result Date: 04/18/2024 CLINICAL DATA:  Polytrauma, blunt Left torso pain and left domino pain after fall. EXAM: CT CHEST, ABDOMEN, AND PELVIS WITH CONTRAST TECHNIQUE: Multidetector CT imaging of the chest, abdomen and pelvis was performed following the standard protocol during bolus administration of intravenous contrast. RADIATION DOSE REDUCTION: This exam was performed according to the departmental dose-optimization program which includes automated exposure control, adjustment of the mA and/or kV according to patient size and/or use of iterative reconstruction technique. CONTRAST:  75mL OMNIPAQUE  IOHEXOL  350 MG/ML SOLN COMPARISON:  Chest x-ray 04/18/2024, CT pelvis 6 of June 25 FINDINGS: CHEST: Cardiovascular: Right chest wall dual lead pacemaker. No aortic injury. The thoracic aorta is normal in caliber. The heart is normal in size. Trace volume pericardial effusion likely containing simple fluid with streak artifact limiting the evaluation of the density. No definite hemopericardium. Severe degenerative changes. Aortic valve leaflet calcification. Coronary artery calcification. Mediastinum/Nodes: No pneumomediastinum. No mediastinal hematoma. The esophagus is unremarkable. The thyroid is unremarkable. The central airways are patent. No mediastinal, hilar, or axillary lymphadenopathy. Lungs/Pleura: No focal consolidation. No pulmonary nodule. No pulmonary mass. No pulmonary contusion or laceration. No  pneumatocele formation. At least small volume loculated right pleural effusion. Trace to small volume left pneumothorax. No right pneumothorax. No hemothorax. Musculoskeletal/Chest wall: No chest  wall mass. Left markedly displaced lateral 7-8 rib fractures-these ribs are displaced medially by at least 1 cm into the lung and adjacent to the pericardium. Acute nondisplaced lateral 6 posterolateral left 9-11 rib fractures. Associated severe soft tissue emphysema along the left posterolateral back soft tissues. Please see separately dictated CT thoracolumbar spine. ABDOMEN / PELVIS: Hepatobiliary: Not enlarged. No focal lesion. No laceration or subcapsular hematoma. The gallbladder is otherwise unremarkable with no radio-opaque gallstones. No biliary ductal dilatation. Pancreas: Normal pancreatic contour. No main pancreatic duct dilatation. Spleen: Not enlarged. No focal lesion. No laceration, subcapsular hematoma, or vascular injury. Adrenals/Urinary Tract: No nodularity bilaterally. Bilateral kidneys enhance symmetrically. No hydronephrosis. No contusion, laceration, or subcapsular hematoma. Subcentimeter hypodensities are too small characterize-no further follow-up indicated. No injury to the vascular structures or collecting systems. No hydroureter. The urinary bladder is unremarkable. Stomach/Bowel: Sigmoid resection surgical changes. No small or large bowel wall thickening or dilatation. Colonic diverticulosis. The appendix is unremarkable. Vasculature/Lymphatics: Severe atherosclerotic plaque. No abdominal aorta or iliac aneurysm. No active contrast extravasation or pseudoaneurysm. No abdominal, pelvic, inguinal lymphadenopathy. Reproductive: Normal. Other: No simple free fluid ascites. No pneumoperitoneum. No hemoperitoneum. No mesenteric hematoma identified. No organized fluid collection. Musculoskeletal: No significant soft tissue hematoma. No acute pelvic fracture. Bilateral hip at least moderate degenerative  changes. Please see separately dictated CT thoracolumbar spine. Other ports and devices: None. IMPRESSION: 1. Acute markedly displaced left lateral 7 & 8 rib fractures-these ribs are displaced medially by at least 1 cm into the lung and adjacent to the pericardium. No associated definite hemothorax. Recommend surgical consultation. 2. Acute displaced lateral 6 and posterolateral left 9-11 rib fractures. 3. Trace to small volume left pneumothorax. 4. Trace volume pericardial effusion likely containing simple fluid with streak artifact limiting the evaluation of the density. No definite hemopericardium. Recommend attention on follow-up. 5. No acute intra-abdominal or intrapelvic traumatic injury. 6. Please see separately dictated CT thoracolumbar spine. 7. Other imaging findings of potential clinical significance: At least small volume loculated right pleural effusion. Aortic Atherosclerosis (ICD10-I70.0) -including coronary artery and aortic valve leaflet calcification-correlate for aortic stenosis. These results were called by telephone at the time of interpretation on 04/18/2024 at 4:14 pm to provider Emil Share, who verbally acknowledged these results. Electronically Signed: By: Morgane  Naveau M.D. On: 04/18/2024 16:28   CT Head Wo Contrast Result Date: 04/18/2024 CLINICAL DATA:  Neck trauma (Age >= 65y); Head trauma, minor (Age >= 65y) Fall EXAM: CT HEAD WITHOUT CONTRAST CT CERVICAL SPINE WITHOUT CONTRAST TECHNIQUE: Multidetector CT imaging of the head and cervical spine was performed following the standard protocol without intravenous contrast. Multiplanar CT image reconstructions of the cervical spine were also generated. RADIATION DOSE REDUCTION: This exam was performed according to the departmental dose-optimization program which includes automated exposure control, adjustment of the mA and/or kV according to patient size and/or use of iterative reconstruction technique. COMPARISON:  CT head 12/23/2023,  CT C-spine 12/24/2023 FINDINGS: CT HEAD FINDINGS Brain: Cerebral ventricle sizes are concordant with the degree of cerebral volume loss. Patchy and confluent areas of decreased attenuation are noted throughout the deep and periventricular white matter of the cerebral hemispheres bilaterally, compatible with chronic microvascular ischemic disease. No evidence of large-territorial acute infarction. No parenchymal hemorrhage. No mass lesion. No extra-axial collection. No mass effect or midline shift. No hydrocephalus. Basilar cisterns are patent. Vascular: No hyperdense vessel. Skull: No acute fracture or focal lesion. Sinuses/Orbits: Paranasal sinuses and mastoid air cells are clear. Bilateral lens replacement. Otherwise the orbits  are unremarkable. Other: None. CT CERVICAL SPINE FINDINGS Alignment: Normal. Skull base and vertebrae: Multilevel moderate degenerative changes of the spine. No associated severe osseous neural foraminal or central canal stenosis. No acute fracture. No aggressive appearing focal osseous lesion or focal pathologic process. Soft tissues and spinal canal: No prevertebral fluid or swelling. No visible canal hematoma. Upper chest: At trace volume left apical pneumothorax. Trace right apical pneumothorax. Biapical pleural/pulmonary scarring. Other: None. IMPRESSION: 1.  No acute intracranial abnormality. 2. No acute displaced fracture or traumatic listhesis of the cervical spine. 3. At trace volume LEFT apical pneumothorax. 4.  Trace right apical pneumothorax. Electronically Signed   By: Morgane  Naveau M.D.   On: 04/18/2024 16:11   CT Cervical Spine Wo Contrast Result Date: 04/18/2024 CLINICAL DATA:  Neck trauma (Age >= 65y); Head trauma, minor (Age >= 65y) Fall EXAM: CT HEAD WITHOUT CONTRAST CT CERVICAL SPINE WITHOUT CONTRAST TECHNIQUE: Multidetector CT imaging of the head and cervical spine was performed following the standard protocol without intravenous contrast. Multiplanar CT image  reconstructions of the cervical spine were also generated. RADIATION DOSE REDUCTION: This exam was performed according to the departmental dose-optimization program which includes automated exposure control, adjustment of the mA and/or kV according to patient size and/or use of iterative reconstruction technique. COMPARISON:  CT head 12/23/2023, CT C-spine 12/24/2023 FINDINGS: CT HEAD FINDINGS Brain: Cerebral ventricle sizes are concordant with the degree of cerebral volume loss. Patchy and confluent areas of decreased attenuation are noted throughout the deep and periventricular white matter of the cerebral hemispheres bilaterally, compatible with chronic microvascular ischemic disease. No evidence of large-territorial acute infarction. No parenchymal hemorrhage. No mass lesion. No extra-axial collection. No mass effect or midline shift. No hydrocephalus. Basilar cisterns are patent. Vascular: No hyperdense vessel. Skull: No acute fracture or focal lesion. Sinuses/Orbits: Paranasal sinuses and mastoid air cells are clear. Bilateral lens replacement. Otherwise the orbits are unremarkable. Other: None. CT CERVICAL SPINE FINDINGS Alignment: Normal. Skull base and vertebrae: Multilevel moderate degenerative changes of the spine. No associated severe osseous neural foraminal or central canal stenosis. No acute fracture. No aggressive appearing focal osseous lesion or focal pathologic process. Soft tissues and spinal canal: No prevertebral fluid or swelling. No visible canal hematoma. Upper chest: At trace volume left apical pneumothorax. Trace right apical pneumothorax. Biapical pleural/pulmonary scarring. Other: None. IMPRESSION: 1.  No acute intracranial abnormality. 2. No acute displaced fracture or traumatic listhesis of the cervical spine. 3. At trace volume LEFT apical pneumothorax. 4.  Trace right apical pneumothorax. Electronically Signed   By: Morgane  Naveau M.D.   On: 04/18/2024 16:11   DG Ribs Unilateral  W/Chest Left Result Date: 04/18/2024 EXAM: AP VIEW XRAY OF THE LEFT RIBS AND CHEST 04/18/2024 01:17:00 PM COMPARISON: 01/08/2024. CLINICAL HISTORY: Fall. Reason for exam: fall. Triage notes: Patient presents to ED via [facility] from home states she was carrying a tray with coffee on it and she tripped and hit her left side on the rocker, c/o left lateral rib pain pain is worse with movement. FINDINGS: BONES: Acute, mildly displaced left 6th, 7th, 8th, and 9th rib fractures. LUNGS AND PLEURA: No consolidation or pulmonary edema. Trace right pleural effusion. Trace left apical pneumothorax. HEART AND MEDIASTINUM: Right chest wall cardiac pacemaker. Atherosclerotic plaque. No acute abnormality of the cardiac and mediastinal silhouettes. SOFT TISSUES: Left hemithorax subcutaneous emphysema. IMPRESSION: 1. Mildly displaced left 6th9th rib fractures. 2. Trace left apical pneumothorax with associated left hemithorax subcutaneous emphysema. 3. Small right pleural effusion. 4.  Critical results were verbally delivered to Barrett, Warren SAILOR, PA-C at the time of interpretation on 04/18/2024 at 1:45 pm Electronically signed by: Waddell Calk MD 04/18/2024 01:45 PM EDT RP Workstation: [id number]    Assessment/Plan: Kathryn [name] is an 89 y.o. female who presented as a non-leveled trauma after a GLF.   L 6-11 rib fx w/ displacement and small PTX - multimodal pain control, pulmonary toilet    FEN - Regular VTE - Lovenox and Sequential Compression Devices ID - None   Dispo - Step-down unit  LOS: 1 day    Lynda Leos 04/19/2024

## 2024-04-19 NOTE — Progress Notes (Signed)
 Transition of Care [facility]) - CAGE-AID Screening   Patient Details  Name: Kathryn [name] MRN: [id number] Date of Birth: 30-Mar-1928  Transition of Care [facility]) CM/SW Contact:    Bernardino Mayotte, RN Phone Number: 04/19/2024, 11:23 PM   Clinical Narrative:  Patient denies the use of alcohol and illicit drugs. Resources not given at this time.  CAGE-AID Screening:    Have You Ever Felt You Ought to Cut Down on Your Drinking or Drug Use?: No Have People Annoyed You By Critizing Your Drinking Or Drug Use?: No Have You Felt Bad Or Guilty About Your Drinking Or Drug Use?: No Have You Ever Had a Drink or Used Drugs First Thing In The Morning to Steady Your Nerves or to Get Rid of a Hangover?: No CAGE-AID Score: 0  Substance Abuse Education Offered: No

## 2024-04-20 NOTE — NC FL2 (Signed)
 [place]  MEDICAID FL2 LEVEL OF CARE FORM     IDENTIFICATION  Patient Name: Kathryn [name] Birthdate: 1928-03-11 Sex: female Admission Date (Current Location): 04/18/2024  [facility] and [place] Number:  Producer, television/film/video and Address:  [facility], [address], [place], [place] [address]      Provider Number: [id number]  Attending Physician Name and Address:  Md, Trauma, MD  Relative Name and Phone Number:  Micael Gear [phone])  [phone] [facility])    Current Level of Care: Hospital Recommended Level of Care: Skilled Nursing Facility Prior Approval Number:    Date Approved/Denied:   PASRR Number: [id number]  Discharge Plan:      Current Diagnoses: Patient Active Problem List   Diagnosis Date Noted   Trauma 04/18/2024   Laceration of muscle(s) and tendon(s) of peroneal muscle group at lower leg level, right leg, subsequent encounter 01/08/2024   Acute on chronic diastolic HF (heart failure) (HCC) 12/25/2023   Acute on chronic diastolic CHF (congestive heart failure) (HCC) 12/24/2023   Laceration of right leg excluding thigh, initial encounter 12/24/2023   CKD stage 3a, GFR 45-59 ml/min (HCC) 12/24/2023   Ruptured silicone breast implant 08/28/2017   Normal coronary arteries 07/04/2017   Pacemaker battery depletion 09/14/2015   S/P colostomy takedown 05/21/2014   Diverticulitis of colon (without mention of hemorrhage)(562.11) 01/27/2014   Colostomy in place [facility]) 01/27/2014   Paroxysmal atrial fibrillation (HCC) 01/19/2014   Complete heart block (HCC) 01/19/2014   Pacemaker - dual-chamber Medtronic 01/19/2014   Preop cardiovascular exam 01/19/2014   HTN (hypertension) 01/19/2014    Orientation RESPIRATION BLADDER Height & Weight     Self, Time, Situation, Place  Normal External catheter Weight: 115 lb (52.2 kg) Height:  5' 4 (162.6 cm)  BEHAVIORAL SYMPTOMS/MOOD NEUROLOGICAL BOWEL NUTRITION STATUS        Diet (reg)   AMBULATORY STATUS COMMUNICATION OF NEEDS Skin   Limited Assist Verbally Normal                       Personal Care Assistance Level of Assistance  Bathing, Feeding, Dressing Bathing Assistance: Limited assistance Feeding assistance: Limited assistance Dressing Assistance: Limited assistance     Functional Limitations Info             SPECIAL CARE FACTORS FREQUENCY  PT (By licensed PT), OT (By licensed OT)     PT Frequency: 5 times per week OT Frequency: 5 times per week            Contractures      Additional Factors Info  Code Status, Allergies Code Status Info: full Allergies Info: Diovan (Valsartan), Ketamine , Lipitor (Atorvastatin), Aldactone  (Spironolactone ), Codeine, Darvon-n (Propoxyphene), Procaine, Roxicodone (Oxycodone), Boniva (Ibandronate), Epipen  2-pak (Epinephrine ), Iron , Levaquin (Levofloxacin), Proton Pump Inhibitors           Current Medications (04/20/2024):  This is the current hospital active medication list Current Facility-Administered Medications  Medication Dose Route Frequency Provider Last Rate Last Admin   acetaminophen  (TYLENOL ) tablet 1,000 mg  1,000 mg Oral Q6H Polly Cordella LABOR, MD   1,000 mg at 04/19/24 2303   carvedilol  (COREG ) tablet 12.5 mg  12.5 mg Oral BID Ramirez, Armando, MD   12.5 mg at 04/20/24 0836   docusate sodium  (COLACE) capsule 100 mg  100 mg Oral BID Metzger, Gregory A, MD   100 mg at 04/20/24 0836   enoxaparin (LOVENOX) injection 30 mg  30 mg Subcutaneous Q24H Polly Cordella LABOR,  MD   30 mg at 04/20/24 0834   hydrALAZINE (APRESOLINE) injection 10 mg  10 mg Intravenous Q2H PRN Polly Cordella LABOR, MD       methocarbamol (ROBAXIN) tablet 500 mg  500 mg Oral Q8H Polly Cordella LABOR, MD   500 mg at 04/20/24 9163   Or   methocarbamol (ROBAXIN) injection 500 mg  500 mg Intravenous Q8H Polly Cordella LABOR, MD   500 mg at 04/18/24 1736   metoprolol  tartrate (LOPRESSOR ) injection 5 mg  5 mg Intravenous Q6H PRN Polly Cordella LABOR, MD       morphine  (PF) 2 MG/ML injection 1 mg  1 mg Intravenous Q4H PRN Polly Cordella LABOR, MD   1 mg at 04/18/24 1739   ondansetron  (ZOFRAN -ODT) disintegrating tablet 4 mg  4 mg Oral Q6H PRN Polly Cordella LABOR, MD   4 mg at 04/20/24 9166   Or   ondansetron  (ZOFRAN ) injection 4 mg  4 mg Intravenous Q6H PRN Metzger, Gregory A, MD       polyethylene glycol (MIRALAX / GLYCOLAX) packet 17 g  17 g Oral Daily PRN Polly Cordella LABOR, MD       traMADol  (ULTRAM ) tablet 25 mg  25 mg Oral Q6H PRN Polly Cordella LABOR, MD   25 mg at 04/20/24 9167     Discharge Medications: Please see discharge summary for a list of discharge medications.  Relevant Imaging Results:  Relevant Lab Results:   Additional Information SS# [id number]  Jayjay Littles E Decker Cogdell, LCSW

## 2024-04-20 NOTE — Evaluation (Signed)
Occupational Therapy Evaluation Patient Details Name: Kathryn [name] MRN: [id number] DOB: March 20, 1928 Today's Date: 04/20/2024   History of Present Illness   89 y.o. female admitted 10/11 after sustaining a GLF resulting in L 6-11 rib fx w/ displacement and small PTX.  PMH paroxysmal A-fib not on anticoagulation, pacemaker, hypertension, chronic kidney disease stage IIIa     Clinical Impressions Kathryn [name] was evaluated s/p the above admission list. She lives at home alone and is mod I for ADLs and mobility at baseline. Upon evaluation the pt was limited by L flank pain, weakness, poor activity tolerance and nausea. Overall she needed max A to roll to the R and refused further attempt at mobility due to exacerbation of pain. Due to the deficits listed below the pt also needs up to total A for LB ADLs and max A for UB ADLs. Pt will benefit from continued acute OT services and skilled inpatient follow up therapy, <3 hours/day.     If plan is discharge home, recommend the following:   A lot of help with walking and/or transfers;A lot of help with bathing/dressing/bathroom;Assistance with cooking/housework;Assist for transportation;Help with stairs or ramp for entrance     Functional Status Assessment   Patient has had a recent decline in their functional status and demonstrates the ability to make significant improvements in function in a reasonable and predictable amount of time.       Precautions/Restrictions   Precautions Precautions: Fall Recall of Precautions/Restrictions: Intact Precaution/Restrictions Comments: Monitor O2. Restrictions Weight Bearing Restrictions Per Provider Order: No     Mobility Bed Mobility Overal bed mobility: Needs Assistance Bed Mobility: Rolling           General bed mobility comments: max A to roll to the R. Pt declined further mobility or attempt to roll to the L    Transfers                              ADL either  performed or assessed with clinical judgement   ADL Overall ADL's : Needs assistance/impaired Eating/Feeding: Set up;Bed level   Grooming: Set up;Bed level   Upper Body Bathing: Maximal assistance   Lower Body Bathing: Total assistance   Upper Body Dressing : Maximal assistance   Lower Body Dressing: Total assistance   Toilet Transfer: Total assistance   Toileting- Clothing Manipulation and Hygiene: Total assistance       Functional mobility during ADLs: Total assistance General ADL Comments: pt extremely pain limited today, declined EOB or OOB after pain exacerbated by rolling     Vision Baseline Vision/History: 0 No visual deficits Vision Assessment?: No apparent visual deficits     Perception Perception: Not tested       Praxis Praxis: Not tested       Pertinent Vitals/Pain Pain Assessment Pain Assessment: Faces Faces Pain Scale: Hurts whole lot Pain Location: Lt flank Pain Descriptors / Indicators: Aching Pain Intervention(s): Limited activity within patient's tolerance, Monitored during session     Extremity/Trunk Assessment Upper Extremity Assessment Upper Extremity Assessment: Generalized weakness;RUE deficits/detail;LUE deficits/detail RUE Deficits / Details: ROM WFL, weak with slowed coordination LUE Deficits / Details: unable to move due to exacerbation of flank pain   Lower Extremity Assessment Lower Extremity Assessment: Defer to PT evaluation   Cervical / Trunk Assessment Cervical / Trunk Assessment: Other exceptions Cervical / Trunk Exceptions: L 6-11 rib pain   Communication Communication Communication: No apparent difficulties   Cognition  Arousal: Alert Behavior During Therapy: WFL for tasks assessed/performed Cognition: No apparent impairments             OT - Cognition Comments: self limiting due to pain. Unable to recall the importance of the IS or positional changes                 Following commands: Intact        Cueing  General Comments   Cueing Techniques: Verbal cues  VSS on RA, SpO2 92-94%   Exercises Exercises: Other exercises Other Exercises Other Exercises: IS, pt attempted to use but had increased pain adn declined   Shoulder Instructions      Home Living Family/patient expects to be discharged to:: Private residence Living Arrangements: Alone Available Help at Discharge: Family;Available PRN/intermittently Type of Home: House Home Access: Stairs to enter [employer] of Steps: 7 Entrance Stairs-Rails: Right;Left Home Layout: One level     Bathroom Shower/Tub: Tub/shower unit;Walk-in shower   Bathroom Toilet: Handicapped height Bathroom Accessibility: Yes How Accessible: Accessible via walker Home Equipment: Cane - single point;Rollator (4 wheels);Rolling Walker (2 wheels);Shower seat - built in;Grab bars - toilet;Grab bars - tub/shower          Prior Functioning/Environment Prior Level of Function : Independent/Modified Independent;History of Falls (last six months)             Mobility Comments: Using AD to ambulate at home, does not cook or drive - sons provied meals and take her to appointments. hx of multiple falls ADLs Comments: able to bath/dress herself, sons provide meals.    OT Problem List: Decreased strength;Decreased range of motion;Decreased activity tolerance;Impaired balance (sitting and/or standing);Decreased cognition;Decreased safety awareness;Decreased knowledge of use of DME or AE;Decreased knowledge of precautions;Pain   OT Treatment/Interventions: Self-care/ADL training;Therapeutic exercise;DME and/or AE instruction;Energy conservation;Therapeutic activities;Patient/family education;Balance training      OT Goals(Current goals can be found in the care plan section)   Acute Rehab OT Goals Patient Stated Goal: less pain OT Goal Formulation: With patient Time For Goal Achievement: 04/25/2024 Potential to Achieve Goals: Good ADL  Goals Pt Will Perform Upper Body Dressing: with modified independence Pt Will Perform Lower Body Dressing: with min assist;sit to/from stand Pt Will Transfer to Toilet: with min assist;ambulating Additional ADL Goal #1: Pt will complete bed mobility with mod I as a precursor to ADLs   OT Frequency:  Min 2X/week       AM-PAC OT 6 Clicks Daily Activity     Outcome Measure Help from another person eating meals?: A Little Help from another person taking care of personal grooming?: A Little Help from another person toileting, which includes using toliet, bedpan, or urinal?: Total Help from another person bathing (including washing, rinsing, drying)?: Total Help from another person to put on and taking off regular upper body clothing?: A Lot Help from another person to put on and taking off regular lower body clothing?: Total 6 Click Score: 11   End of Session Nurse Communication: Mobility status  Activity Tolerance: Patient limited by pain Patient left: in bed;with bed alarm set;with call bell/phone within reach  OT Visit Diagnosis: Unsteadiness on feet (R26.81);Other abnormalities of gait and mobility (R26.89);Muscle weakness (generalized) (M62.81);Pain                Time: 8861-8845 OT Time Calculation (min): 16 min Charges:  OT General Charges $OT Visit: 1 Visit OT Evaluation $OT Eval Moderate Complexity: 1 Mod  Lucie Kendall, OTR/L Acute [employer] [phone]  Secure Chat Communication Preferred   Lucie JONETTA Kendall 04/20/2024, 1:07 PM

## 2024-04-20 NOTE — TOC Initial Note (Signed)
 Transition of Care [facility]) - Initial/Assessment Note    Patient Details  Name: Kathryn [name] MRN: [id number] Date of Birth: 1928-05-29  Transition of Care [facility]) CM/SW Contact:    Jamiere Gulas E Briggette Najarian, LCSW Phone Number: 04/20/2024, 11:31 AM  Clinical Narrative:                 Patient was admitted post fall. CSW met with patient at bedside. Patient lives alone. Patient states her family is local but works during the day. Patient states she has been to [facility] in the past for STR. Patient is agreeable to STR, and would prefer to go to [facility] again if possible.  SNF work up started.   Expected Discharge Plan: Skilled Nursing Facility Barriers to Discharge: Continued Medical Work up   Patient Goals and CMS Choice Patient states their goals for this hospitalization and ongoing recovery are:: STR CMS Medicare.gov Compare Post Acute Care list provided to:: Patient Choice offered to / list presented to : Patient      Expected Discharge Plan and Services       Living arrangements for the past 2 months: Single Family Home                                      Prior Living Arrangements/Services Living arrangements for the past 2 months: Single Family Home Lives with:: Self Patient language and need for interpreter reviewed:: Yes Do you feel safe going back to the place where you live?: Yes      Need for Family Participation in Patient Care: Yes (Comment) Care giver support system in place?: Yes (comment) Current home services: DME Criminal Activity/Legal Involvement Pertinent to Current Situation/Hospitalization: No - Comment as needed  Activities of Daily Living      Permission Sought/Granted Permission sought to share information with : Facility Industrial/product designer granted to share information with : Yes, Verbal Permission Granted     Permission granted to share info w AGENCY: SNFs        Emotional Assessment       Orientation: :  Oriented to Self, Oriented to Place, Oriented to  Time, Oriented to Situation Alcohol / Substance Use: Not Applicable Psych Involvement: No (comment)  Admission diagnosis:  Trauma [T14.90XA] Closed fracture of multiple ribs of left side, initial encounter [S22.42XA] Patient Active Problem List   Diagnosis Date Noted   Trauma 04/18/2024   Laceration of muscle(s) and tendon(s) of peroneal muscle group at lower leg level, right leg, subsequent encounter 01/08/2024   Acute on chronic diastolic HF (heart failure) (HCC) 12/25/2023   Acute on chronic diastolic CHF (congestive heart failure) (HCC) 12/24/2023   Laceration of right leg excluding thigh, initial encounter 12/24/2023   CKD stage 3a, GFR 45-59 ml/min (HCC) 12/24/2023   Ruptured silicone breast implant 08/28/2017   Normal coronary arteries 07/04/2017   Pacemaker battery depletion 09/14/2015   S/P colostomy takedown 05/21/2014   Diverticulitis of colon (without mention of hemorrhage)(562.11) 01/27/2014   Colostomy in place [facility]) 01/27/2014   Paroxysmal atrial fibrillation (HCC) 01/19/2014   Complete heart block (HCC) 01/19/2014   Pacemaker - dual-chamber Medtronic 01/19/2014   Preop cardiovascular exam 01/19/2014   HTN (hypertension) 01/19/2014   PCP:  Shayne Anes, MD Pharmacy:   [facility] [id number] - [place], [place] - [address] AT [facility] [address] [place]  [place] [address] Phone: [phone] Fax: [phone]     Social Drivers of Health (SDOH) Social History: SDOH Screenings   Food Insecurity: No Food Insecurity (04/18/2024)  Housing: Low Risk  (04/18/2024)  Transportation Needs: No Transportation Needs (04/18/2024)  Utilities: Not At Risk (04/18/2024)  Social Connections: Patient Declined (04/18/2024)  Tobacco Use: Low Risk  (04/18/2024)   SDOH Interventions:     Readmission Risk Interventions    01/09/2024   12:20 PM  Readmission Risk Prevention Plan   Transportation Screening Complete  PCP or Specialist Appt within 5-7 Days Complete  Home Care Screening Complete  Medication Review (RN CM) Complete

## 2024-04-20 NOTE — TOC Progression Note (Addendum)
 Transition of Care [facility]) - Progression Note    Patient Details  Name: Kathryn [name] MRN: [id number] Date of Birth: February 14, 1928  Transition of Care [facility]) CM/SW Contact  Riley Hallum E Kameren Baade, LCSW Phone Number: 04/20/2024, 3:21 PM  Clinical Narrative:    [facility] bed offer made, per MD, patient is medically ready for SNF. Per [place] in Admissions, they can accept tomorrow if shara is approved. Auth started in [place].  4:05- Auth approved and [facility] can accept tomorrow pending medical readiness. RN to notify patient when she wakes up.  Expected Discharge Plan: Skilled Nursing Facility Barriers to Discharge: Continued Medical Work up               Expected Discharge Plan and Services       Living arrangements for the past 2 months: Single Family Home                                       Social Drivers of Health (SDOH) Interventions SDOH Screenings   Food Insecurity: No Food Insecurity (04/18/2024)  Housing: Low Risk  (04/18/2024)  Transportation Needs: No Transportation Needs (04/18/2024)  Utilities: Not At Risk (04/18/2024)  Social Connections: Patient Declined (04/18/2024)  Tobacco Use: Low Risk  (04/18/2024)    Readmission Risk Interventions    01/09/2024   12:20 PM  Readmission Risk Prevention Plan  Transportation Screening Complete  PCP or Specialist Appt within 5-7 Days Complete  Home Care Screening Complete  Medication Review (RN CM) Complete

## 2024-04-20 NOTE — Care Management Important Message (Signed)
 Important Message  Patient Details  Name: Kathryn [name] MRN: [id number] Date of Birth: Nov 06, 1927   Important Message Given:  Yes - Medicare IM     Jon Cruel 04/20/2024, 4:40 PM

## 2024-04-20 NOTE — Progress Notes (Signed)
   Trauma/Critical Care Follow Up Note  Subjective:    Overnight Issues:   Objective:  Vital signs for last 24 hours: Temp:  [97.5 F (36.4 C)-97.8 F (36.6 C)] 97.6 F (36.4 C) (10/13 1635) Pulse Rate:  [70-72] 72 (10/13 1635) Resp:  [15-18] 16 (10/13 1635) BP: (92-120)/(51-66) 103/51 (10/13 1635) SpO2:  [93 %-98 %] 96 % (10/13 1635)  Hemodynamic parameters for last 24 hours:    Intake/Output from previous day: No intake/output data recorded.  Intake/Output this shift: Total I/O In: 120 [P.O.:120] Out: -   Vent settings for last 24 hours:    Physical Exam:  Gen: comfortable, no distress Neuro: follows commands, alert, communicative HEENT: PERRL Neck: supple CV: RRR Pulm: unlabored breathing on RA Abd: soft, NT  ,   GU: urine clear and yellow, +spontaneous voids Extr: wwp, no edema  No results found for this or any previous visit (from the past 24 hours).  Assessment & Plan:  Present on Admission:  Trauma    LOS: 2 days   Additional comments:I reviewed the patient's new clinical lab test results.   and I reviewed the patients new imaging test results.    GLF   L 6-11 rib fx w/ displacement and small PTX - multimodal pain control, pulmonary toilet  FEN - Regular VTE - Lovenox and Sequential Compression Devices ID - None   Dispo - 4NP, SNF pending ins shara Dreama GEANNIE Paola, MD Trauma & General Surgery Please use [email or web address] to contact on call provider  04/20/2024  *Care during the described time interval was provided by me. I have reviewed this patient's available data, including medical history, events of note, physical examination and test results as part of my evaluation.

## 2024-04-21 ENCOUNTER — Inpatient Hospital Stay (HOSPITAL_COMMUNITY)

## 2024-04-21 LAB — CBC
HCT: 28.7 % — ABNORMAL LOW (ref 36.0–46.0)
Hemoglobin: 9.2 g/dL — ABNORMAL LOW (ref 12.0–15.0)
MCH: 30.2 pg (ref 26.0–34.0)
MCHC: 32.1 g/dL (ref 30.0–36.0)
MCV: 94.1 fL (ref 80.0–100.0)
Platelets: 233 K/uL (ref 150–400)
RBC: 3.05 MIL/uL — ABNORMAL LOW (ref 3.87–5.11)
RDW: 14.1 % (ref 11.5–15.5)
WBC: 15.1 K/uL — ABNORMAL HIGH (ref 4.0–10.5)
nRBC: 0 % (ref 0.0–0.2)

## 2024-04-21 LAB — URINALYSIS, ROUTINE W REFLEX MICROSCOPIC
Bilirubin Urine: NEGATIVE
Glucose, UA: NEGATIVE mg/dL
Hgb urine dipstick: NEGATIVE
Ketones, ur: NEGATIVE mg/dL
Leukocytes,Ua: NEGATIVE
Nitrite: NEGATIVE
Protein, ur: 30 mg/dL — AB
Specific Gravity, Urine: 1.029 (ref 1.005–1.030)
pH: 5 (ref 5.0–8.0)

## 2024-04-21 LAB — BASIC METABOLIC PANEL WITH GFR
Anion gap: 15 (ref 5–15)
BUN: 49 mg/dL — ABNORMAL HIGH (ref 8–23)
CO2: 17 mmol/L — ABNORMAL LOW (ref 22–32)
Calcium: 8.4 mg/dL — ABNORMAL LOW (ref 8.9–10.3)
Chloride: 101 mmol/L (ref 98–111)
Creatinine, Ser: 2.44 mg/dL — ABNORMAL HIGH (ref 0.44–1.00)
GFR, Estimated: 18 mL/min — ABNORMAL LOW (ref >60)
Glucose, Bld: 106 mg/dL — ABNORMAL HIGH (ref 70–99)
Potassium: 4.5 mmol/L (ref 3.5–5.1)
Sodium: 133 mmol/L — ABNORMAL LOW (ref 135–145)

## 2024-04-21 MED ORDER — SODIUM CHLORIDE 0.9 % IV SOLN: INTRAVENOUS | Status: AC

## 2024-04-21 MED ORDER — DOCUSATE SODIUM 100 MG PO CAPS: 100.0000 mg | ORAL_CAPSULE | Freq: Every day | ORAL | Status: DC | PRN

## 2024-04-21 MED ORDER — POLYETHYLENE GLYCOL 3350 17 G PO PACK: 17.0000 g | PACK | Freq: Every day | ORAL | Status: DC | PRN

## 2024-04-21 MED ORDER — ACETAMINOPHEN 500 MG PO TABS: 1000.0000 mg | ORAL_TABLET | Freq: Four times a day (QID) | ORAL | Status: DC

## 2024-04-21 MED ORDER — METHOCARBAMOL 500 MG PO TABS: 500.0000 mg | ORAL_TABLET | Freq: Three times a day (TID) | ORAL | Status: DC | PRN

## 2024-04-21 MED ORDER — METHOCARBAMOL 1000 MG/10ML IJ SOLN: 500.0000 mg | Freq: Three times a day (TID) | INTRAMUSCULAR | Status: DC

## 2024-04-21 MED ORDER — LACTATED RINGERS IV BOLUS
1000.0000 mL | Freq: Once | INTRAVENOUS | Status: AC
  Administered 2024-04-22: 1000 mL via INTRAVENOUS

## 2024-04-21 MED ORDER — TRAMADOL HCL 50 MG PO TABS: 25.0000 mg | ORAL_TABLET | Freq: Four times a day (QID) | ORAL | 0 refills | Status: DC | PRN

## 2024-04-21 MED ORDER — SODIUM CHLORIDE 0.9 % IV BOLUS
250.0000 mL | Freq: Once | INTRAVENOUS | Status: AC
  Administered 2024-04-21: 250 mL via INTRAVENOUS

## 2024-04-21 MED ORDER — SODIUM CHLORIDE 0.9 % IV BOLUS
500.0000 mL | Freq: Once | INTRAVENOUS | Status: AC
  Administered 2024-04-21: 500 mL via INTRAVENOUS

## 2024-04-21 MED ORDER — METHOCARBAMOL 500 MG PO TABS
500.0000 mg | ORAL_TABLET | Freq: Three times a day (TID) | ORAL | Status: DC
  Administered 2024-04-21 – 2024-04-22 (×3): 500 mg via ORAL
  Filled 2024-04-21 (×3): qty 1

## 2024-04-21 NOTE — Progress Notes (Signed)
 Orthopedic Tech Progress Note Patient Details:  Kathryn [name] [birth year] [id number]  Patient ID: [name], female   DOB: 01-10-1928, 89 y.o.   MRN: [id number] Reached out to RN on duty and patient has not received WC just yet. Awaiting on the go from day shift to apply U.B. Lindyn Vossler L Mosi Hannold 04/21/2024, 7:25 AM

## 2024-04-21 NOTE — Progress Notes (Signed)
 Physical Therapy Treatment Patient Details Name: Kathryn [name] MRN: [id number] DOB: [birth year] Today's Date: 04/21/2024   History of Present Illness 89 y.o. female admitted 10/11 after sustaining a GLF resulting in L 6-11 rib fx w/ displacement and small PTX.  PMH paroxysmal A-fib not on anticoagulation, pacemaker, hypertension, chronic kidney disease stage IIIa    PT Comments  Pt fatigued and endorsing severe L flank pain, but is agreeable to PT session. Pt tolerating transition to EOB x10 minutes, once sitting EOB pt does not require assist to maintain upright. Pt performing LE exercises well with increased time, declines attempt to stand given pain and mild dizziness (See below). PT educated pt on the importance of regular mobility to prevent secondary complications like PNA, skin breakdown, pt expresses understanding.   BP 95/56 sitting EOB, in supine 80s/50s     If plan is discharge home, recommend the following: Assistance with cooking/housework;Assist for transportation;Help with stairs or ramp for entrance;A lot of help with walking and/or transfers;A lot of help with bathing/dressing/bathroom   Can travel by private vehicle        Equipment Recommendations  None recommended by PT    Recommendations for Other Services       Precautions / Restrictions Precautions Precautions: Fall Recall of Precautions/Restrictions: Intact Restrictions Weight Bearing Restrictions Per Provider Order: No     Mobility  Bed Mobility Overal bed mobility: Needs Assistance Bed Mobility: Supine to Sit, Sit to Supine     Supine to sit: Min assist Sit to supine: Mod assist   General bed mobility comments: assist for trunk elevation/lowering, scooting to/from EOB, and boost up in bed upon return to supine.    Transfers                   General transfer comment: pt declines given severe pain and min dizziness    Ambulation/Gait                   Stairs              Wheelchair Mobility     Tilt Bed    Modified Rankin (Stroke Patients Only)       Balance Overall balance assessment: Needs assistance Sitting-balance support: No upper extremity supported, Feet supported Sitting balance-Leahy Scale: Fair Sitting balance - Comments: can sit EOB x10 minutes at supervision level       Standing balance comment: pt declines attempt                            Communication Communication Communication: No apparent difficulties  Cognition Arousal: Alert Behavior During Therapy: WFL for tasks assessed/performed   PT - Cognitive impairments: No apparent impairments                         Following commands: Impaired Following commands impaired: Follows one step commands with increased time    Cueing Cueing Techniques: Verbal cues  Exercises General Exercises - Lower Extremity Long Arc Quad: AROM, Both, 20 reps, Seated Heel Slides: AAROM, Both, 10 reps, Supine    General Comments General comments (skin integrity, edema, etc.): BP 95/56 sitting EOB, in supine 80s/50s      Pertinent Vitals/Pain Pain Assessment Pain Assessment: Faces Faces Pain Scale: Hurts whole lot Pain Location: Lt flank Pain Descriptors / Indicators: Aching, Grimacing, Guarding, Sharp Pain Intervention(s): Limited activity within patient's tolerance, Monitored during session, Repositioned  Home Living                          Prior Function            PT Goals (current goals can now be found in the care plan section) Acute Rehab PT Goals Patient Stated Goal: Get well, reduce pain PT Goal Formulation: With patient Time For Goal Achievement: 05/03/24 Potential to Achieve Goals: Good Progress towards PT goals: Progressing toward goals    Frequency    Min 2X/week      PT Plan      Co-evaluation              AM-PAC PT 6 Clicks Mobility   Outcome Measure  Help needed turning from your back to your side  while in a flat bed without using bedrails?: A Lot Help needed moving from lying on your back to sitting on the side of a flat bed without using bedrails?: A Lot Help needed moving to and from a bed to a chair (including a wheelchair)?: A Lot Help needed standing up from a chair using your arms (e.g., wheelchair or bedside chair)?: Total Help needed to walk in hospital room?: Total Help needed climbing 3-5 steps with a railing? : Total 6 Click Score: 9    End of Session   Activity Tolerance: Patient tolerated treatment well Patient left: in bed;with call bell/phone within reach;with bed alarm set Nurse Communication: Mobility status PT Visit Diagnosis: Muscle weakness (generalized) (M62.81);History of falling (Z91.81);Pain Pain - Right/Left: Left Pain - part of body:  (flank)     Time: 8471-8449 PT Time Calculation (min) (ACUTE ONLY): 22 min  Charges:    $Therapeutic Activity: 8-22 mins PT General Charges $$ ACUTE PT VISIT: 1 Visit                     [name], PT DPT Acute Rehabilitation Services Secure Chat Preferred  Office [phone]    Makylee Sanborn FORBES [place] 04/21/2024, 4:37 PM

## 2024-04-21 NOTE — TOC Progression Note (Addendum)
 Transition of Care [facility]) - Progression Note    Patient Details  Name: Kathryn [name] MRN: [id number] Date of Birth: [birth year]  Transition of Care [facility]) CM/SW Contact  Yannick Steuber E Keijuan Schellhase, LCSW Phone Number: 04/21/2024, 10:24 AM  Clinical Narrative:    CSW received notification by [place] at [facility] that patient would need to pay her balance from a previous stay of $5,401.86 before admitting. Met with patient at bedside, provided update and other bed offers with Medicare star ratings. Patient defers to son. Called son Darina - he inquired if they could set up a payment plan with [facility] - asked [place] who is checking with their Business Office.  11:45- Requested update from [employer].   12:45- [place] with [facility] stated they cannot accept a payment plan, patient would have to pay in full to return there.  Called patient's son Darina, he states he is on the way to [facility] to speak to them. Provided other bed offers and Medicare ratings for him to choose next preference.  3:45- Called son Darina to follow up. He states [facility] confirmed they cannot take patient without full payment and family is not able to do this. Darina states he is reviewing the list of SNF bed offers. Informed him we need a decision as soon as possible so we can update the insurance auth.   Expected Discharge Plan: Skilled Nursing Facility Barriers to Discharge: Continued Medical Work up               Expected Discharge Plan and Services       Living arrangements for the past 2 months: Single Family Home                                       Social Drivers of Health (SDOH) Interventions SDOH Screenings   Food Insecurity: No Food Insecurity (04/18/2024)  Housing: Low Risk  (04/18/2024)  Transportation Needs: No Transportation Needs (04/18/2024)  Utilities: Not At Risk (04/18/2024)  Social Connections: Patient Declined (04/18/2024)  Tobacco Use: Low Risk  (04/18/2024)     Readmission Risk Interventions    01/09/2024   12:20 PM  Readmission Risk Prevention Plan  Transportation Screening Complete  PCP or Specialist Appt within 5-7 Days Complete  Home Care Screening Complete  Medication Review (RN CM) Complete

## 2024-04-21 NOTE — Progress Notes (Signed)
 Orthopedic Tech Progress Note Patient Details:  Kathryn [name] 09-Feb-1928 [id number]  Ortho Devices Type of Ortho Device: Ace wrap, Unna boot Ortho Device/Splint Location: RLE Ortho Device/Splint Interventions: Ordered, Application, Adjustment   Post Interventions Patient Tolerated: Well Instructions Provided: Care of device  Delanna LITTIE Pac 04/21/2024, 9:49 AM

## 2024-04-21 NOTE — Progress Notes (Signed)
 Patient complaining of 10/10 pain in rib cage. BP soft. Dr. Paola notified. Ordered to give Tramadol  and recheck BP. Bp 92/61 Map 70 at 2046   04/21/24 2000  Vitals  BP (!) 85/60  MAP (mmHg) 69  BP Location Right Arm  BP Method Automatic  Patient Position (if appropriate) Lying  Pulse Rate 75  ECG Heart Rate 71  Resp 19  MEWS COLOR  MEWS Score Color Green  Oxygen Therapy  SpO2 97 %  MEWS Score  MEWS Temp 0  MEWS Systolic 1  MEWS Pulse 0  MEWS RR 0  MEWS LOC 0  MEWS Score 1

## 2024-04-21 NOTE — Discharge Summary (Signed)
 Physician Discharge Summary  Patient ID: Kathryn [name] MRN: [id number] DOB/AGE: 08-31-1927 89 y.o.  Admit date: 04/18/2024 Discharge date: 04/29/2024  Discharge Diagnoses Ground level fall Left 6-11 rib fractures Trace left pneumothorax, resolved AKI on CKD stage IIIa, resolved Urinary retention Failure to thrive and malnutrition   Consultants Nephrology Palliative medicine   Procedures None  HPI: Patient is a 89 y.o. female who presented as a non-leveled trauma s/p GLF. She reported that she lost her balance and fell backward onto her left side, striking a rocking chair. Worked up in the ED and found to have left sided rib fractures and a trace left pneumothorax. Admitted to trauma for pain control and observation.   Hospital Course: Follow up CXR 10/12 showed resolution in trace left pneumothorax. Patient was evaluated by therapies and recommended for SNF upon medical readiness for discharge. Patient then had a decline in renal function and hypotension - felt to be mostly secondary to poor PO intake which is volitional. Nephrology was consulted. Renal function improved but PO intake remained poor. Discussions had with patient about the possibility of hospice in setting of FTT and malnutrition. Patient agreeable to hospice placement. On 04/29/24 patient discharged to inpatient hospice facility.    Allergies as of 04/29/2024       Reactions   Diovan [valsartan] Other (See Comments)   Dizziness Unsteady gait   Ketamine  Other (See Comments)   Altered mental state Hallucinations    Lipitor [atorvastatin] Other (See Comments)   Myalgias   Aldactone  [spironolactone ] Other (See Comments)   Dizziness    Codeine Other (See Comments)   Unknown reaction   Darvon-n [propoxyphene] Other (See Comments)   Hallucinations   Procaine Other (See Comments)   Unknown reaction   Roxicodone [oxycodone] Other (See Comments)   Hallucinations    Boniva [ibandronate] Other (See Comments)    Difficulty swallowing Concerns about side-effects   Epipen  2-pak [epinephrine ] Palpitations, Other (See Comments)   Extreme tachycardia (has pacemaker)   Iron  Diarrhea, Nausea Only   Levaquin [levofloxacin] Other (See Comments)   Shoulder pain Able to take Cipro with no issues   Proton Pump Inhibitors Diarrhea        Medication List     STOP taking these medications    CALCIUM PO   VITAMIN C PO   VITAMIN D -3 PO       TAKE these medications    acetaminophen  500 MG tablet Commonly known as: TYLENOL  Take 2 tablets (1,000 mg total) by mouth every 6 (six) hours. What changed:  how much to take when to take this reasons to take this   carvedilol  12.5 MG tablet Commonly known as: COREG  Take 1 tablet (12.5 mg total) by mouth 2 (two) times daily.   docusate sodium  100 MG capsule Commonly known as: COLACE Take 1 capsule (100 mg total) by mouth daily as needed for mild constipation.   methocarbamol 500 MG tablet Commonly known as: ROBAXIN Take 1 tablet (500 mg total) by mouth every 8 (eight) hours as needed for muscle spasms.   methocarbamol 750 MG tablet Commonly known as: ROBAXIN Take 1 tablet (750 mg total) by mouth 4 (four) times daily.   polyethylene glycol 17 g packet Commonly known as: MIRALAX / GLYCOLAX Take 17 g by mouth daily as needed (constipation).   traMADol  50 MG tablet Commonly known as: ULTRAM  Take 0.5 tablets (25 mg total) by mouth every 6 (six) hours as needed for moderate pain (pain score 4-6).  Signed: Burnard Louder, [facility] 04/29/2024, 11:36 AM Please see Amion for pager number during day hours 7:00am-4:30pm

## 2024-04-21 NOTE — Progress Notes (Signed)
   04/21/24 0813  Provider Notification  Provider Name/Title Lovick MD  Date Provider Notified 04/21/24  Time Provider Notified 0813  Method of Notification Page  Notification Reason Other (Comment) (84/46 (58). Hypotensive and fatigued.)  Provider response At bedside [place] PARAS. PA)  Date of Provider Response 04/21/24  Time of Provider Response 0820   Patient hypotensive 84/46 (58) this AM. HR 72. A&O x3 (disoriented to situation but easy to reorient). Patient reports fatigue. Other than scheduled PM coreg  given 2048 last night, no pain or blood pressure medications administered recently. This AM coreg  held. Lovick MD paged. Burnard PARAS PA at bedside. See new orders.

## 2024-04-21 NOTE — Progress Notes (Signed)
 RLE wound dressing changed. Patient tolerated fairly well. Wound base is painful and appears pink/red with minimal purulent and small serosanguinous amounts of drainage. Ortho tech notified to apply unna boot.

## 2024-04-21 NOTE — Progress Notes (Signed)
  Progress Note   Date: 04/21/2024  Patient Name: Kathryn [name]        MRN#: [id number]  Review of the patient's clinical findings supports the diagnosis of  Acute Kidney Injury

## 2024-04-21 NOTE — Progress Notes (Signed)
 Progress Note     Subjective: Notified by RN that patient is hypotensive this AM, holding home coreg . Did get PM dose of Coreg  last night around 10 PM and BPs have been lower since. UOP not well recorded but has been reported as adequate. Patient does not have usual unna boots on. She is alert and oriented x3 this AM but reports feeling fatigued.   Objective: Vital signs in last 24 hours: Temp:  [97.6 F (36.4 C)-97.8 F (36.6 C)] 97.7 F (36.5 C) (10/14 0807) Pulse Rate:  [70-82] 72 (10/14 0807) Resp:  [16-20] 18 (10/14 0807) BP: (84-112)/(46-58) 84/46 (10/14 0807) SpO2:  [93 %-97 %] 96 % (10/14 0807) Last BM Date : 04/18/24  Intake/Output from previous day: 10/13 0701 - 10/14 0700 In: 120 [P.O.:120] Out: 100 [Urine:100] Intake/Output this shift: No intake/output data recorded.  PE: General: pleasant, WD, elderly female who is laying in bed in NAD HEENT: head is normocephalic, atraumatic.  Sclera are noninjected.  Pupils equal and round.  Ears and nose without any masses or lesions.  Mouth is pink and moist Heart: regular, rate, and rhythm. Palpable radial and pedal pulses bilaterally Lungs: CTAB, no wheezes, rhonchi, or rales noted.  Respiratory effort nonlabored Abd: soft, NT, ND, +BS, no masses, hernias, or organomegaly MS: ACE to RLE. No gross bony deformity of BLE or BUE Skin: chronic skin changes of BLE related to venous insufficiency Neuro: Cranial nerves 2-12 grossly intact, sensation is normal throughout Psych: A&Ox3 with an appropriate affect.    Lab Results:  Recent Labs    04/18/24 1923 04/19/24 0433  WBC 13.4* 9.6  HGB 9.1* 8.1*  HCT 27.8* 24.6*  PLT 212 185   BMET Recent Labs    04/18/24 1410 04/18/24 1415 04/18/24 1923 04/19/24 0433  NA 134* 136  --  135  K 4.4 4.4  --  4.0  CL 100 100  --  101  CO2 23  --   --  25  GLUCOSE 134* 136*  --  105*  BUN 26* 30*  --  27*  CREATININE 1.20* 1.30* 1.17* 1.13*  CALCIUM 9.2  --   --  8.7*    PT/INR No results for input(s): LABPROT, INR in the last 72 hours. CMP     Component Value Date/Time   NA 135 04/19/2024 0433   NA 139 10/14/2023 1539   K 4.0 04/19/2024 0433   CL 101 04/19/2024 0433   CO2 25 04/19/2024 0433   GLUCOSE 105 (H) 04/19/2024 0433   BUN 27 (H) 04/19/2024 0433   BUN 28 10/14/2023 1539   CREATININE 1.13 (H) 04/19/2024 0433   CREATININE 1.00 (H) 09/27/2015 1426   CALCIUM 8.7 (L) 04/19/2024 0433   PROT 6.6 04/18/2024 1410   ALBUMIN 3.2 (L) 04/18/2024 1410   AST 27 04/18/2024 1410   ALT 15 04/18/2024 1410   ALKPHOS 69 04/18/2024 1410   BILITOT 1.3 (H) 04/18/2024 1410   GFRNONAA 45 (L) 04/19/2024 0433   GFRAA >60 02/14/2015 1200   Lipase     Component Value Date/Time   LIPASE 34 04/18/2024 1410       Studies/Results: No results found.  Anti-infectives: Anti-infectives (From admission, onward)    None        Assessment/Plan  GLF L 6-11 rib fxs with tract PTX - no PTX noted on CXR from 10/12, multimodal pain control, IS Hypotension - BP in the 80/40s this AM, alert and oriented but fatigued. Apply unna boots, give  250 cc bolus, check labs and hold home coreg   FEN: reg diet, 250 cc bolus VTE: LMWH ID: no current abx  Dispo: 4 NP, monitor BP this AM. Planning SNF potentially later today vs tomorrow pending clinical improvement   LOS: 3 days   I reviewed last 24 h vitals and pain scores, last 48 h intake and output, last 24 h labs and trends, and last 24 h imaging results.  This care required moderate level of medical decision making.    Burnard JONELLE Louder, [facility] 04/21/2024, 8:22 AM Please see Amion for pager number during day hours 7:00am-4:30pm

## 2024-04-22 ENCOUNTER — Inpatient Hospital Stay (HOSPITAL_COMMUNITY)

## 2024-04-22 LAB — BASIC METABOLIC PANEL WITH GFR
Anion gap: 13 (ref 5–15)
BUN: 59 mg/dL — ABNORMAL HIGH (ref 8–23)
CO2: 17 mmol/L — ABNORMAL LOW (ref 22–32)
Calcium: 8.1 mg/dL — ABNORMAL LOW (ref 8.9–10.3)
Chloride: 101 mmol/L (ref 98–111)
Creatinine, Ser: 2.81 mg/dL — ABNORMAL HIGH (ref 0.44–1.00)
GFR, Estimated: 15 mL/min — ABNORMAL LOW (ref >60)
Glucose, Bld: 110 mg/dL — ABNORMAL HIGH (ref 70–99)
Potassium: 4.5 mmol/L (ref 3.5–5.1)
Sodium: 131 mmol/L — ABNORMAL LOW (ref 135–145)

## 2024-04-22 LAB — COMPREHENSIVE METABOLIC PANEL WITH GFR
ALT: 18 U/L (ref 0–44)
AST: 27 U/L (ref 15–41)
Albumin: 2.4 g/dL — ABNORMAL LOW (ref 3.5–5.0)
Alkaline Phosphatase: 89 U/L (ref 38–126)
Anion gap: 14 (ref 5–15)
BUN: 66 mg/dL — ABNORMAL HIGH (ref 8–23)
CO2: 18 mmol/L — ABNORMAL LOW (ref 22–32)
Calcium: 8.1 mg/dL — ABNORMAL LOW (ref 8.9–10.3)
Chloride: 100 mmol/L (ref 98–111)
Creatinine, Ser: 3.06 mg/dL — ABNORMAL HIGH (ref 0.44–1.00)
GFR, Estimated: 14 mL/min — ABNORMAL LOW (ref >60)
Glucose, Bld: 137 mg/dL — ABNORMAL HIGH (ref 70–99)
Potassium: 4.5 mmol/L (ref 3.5–5.1)
Sodium: 132 mmol/L — ABNORMAL LOW (ref 135–145)
Total Bilirubin: 1.8 mg/dL — ABNORMAL HIGH (ref 0.0–1.2)
Total Protein: 5.4 g/dL — ABNORMAL LOW (ref 6.5–8.1)

## 2024-04-22 LAB — CBC
HCT: 26.6 % — ABNORMAL LOW (ref 36.0–46.0)
Hemoglobin: 8.5 g/dL — ABNORMAL LOW (ref 12.0–15.0)
MCH: 29.5 pg (ref 26.0–34.0)
MCHC: 32 g/dL (ref 30.0–36.0)
MCV: 92.4 fL (ref 80.0–100.0)
Platelets: 243 K/uL (ref 150–400)
RBC: 2.88 MIL/uL — ABNORMAL LOW (ref 3.87–5.11)
RDW: 14.2 % (ref 11.5–15.5)
WBC: 11 K/uL — ABNORMAL HIGH (ref 4.0–10.5)
nRBC: 0 % (ref 0.0–0.2)

## 2024-04-22 LAB — CK: Total CK: 70 U/L (ref 38–234)

## 2024-04-22 MED ORDER — SODIUM CHLORIDE 0.9 % IV SOLN: INTRAVENOUS | Status: DC

## 2024-04-22 MED ORDER — METHOCARBAMOL 1000 MG/10ML IJ SOLN
500.0000 mg | Freq: Every day | INTRAMUSCULAR | Status: DC
  Administered 2024-04-22 – 2024-04-25 (×4): 500 mg via INTRAVENOUS
  Filled 2024-04-22 (×4): qty 10

## 2024-04-22 MED ORDER — HYDROMORPHONE HCL 1 MG/ML IJ SOLN
1.0000 mg | INTRAMUSCULAR | Status: DC | PRN
  Administered 2024-04-23: 1 mg via INTRAVENOUS
  Filled 2024-04-22 (×2): qty 1

## 2024-04-22 MED ORDER — ALBUMIN HUMAN 5 % IV SOLN
12.5000 g | Freq: Once | INTRAVENOUS | Status: AC
  Administered 2024-04-22: 12.5 g via INTRAVENOUS
  Filled 2024-04-22 (×2): qty 250

## 2024-04-22 MED ORDER — LACTATED RINGERS IV SOLN: Freq: Once | INTRAVENOUS | Status: AC

## 2024-04-22 NOTE — Hospital Course (Addendum)
 Kathryn [name] is a 89 year old female who presented as a non-leveled trauma following a ground-level fall (GLF). Imaging revealed left 6th-11th rib fractures with displacement and a small pneumothorax.   aki

## 2024-04-22 NOTE — TOC Progression Note (Addendum)
 Transition of Care [facility]) - Progression Note    Patient Details  Name: Kathryn [name] MRN: [id number] Date of Birth: 04-29-1928  [facility] CM/SW Contact  Rayen Dafoe E Keyaria Lawson, LCSW Phone Number: 04/22/2024, 9:25 AM  Clinical Narrative:    Spoke with patient's son Darina. [facility]. Called [facility] with [facility] - she will talk to [facility] about admission.  Per PA, patient not medically ready for SNF yet. CSW called [facility], spoke with Alan and cancelled auth for now. Will restart auth when appropriate.    Expected Discharge Plan: Skilled Nursing Facility Barriers to Discharge: Continued Medical Work up               Expected Discharge Plan and Services       Living arrangements for the past 2 months: Single Family Home                                       Social Drivers of Health (SDOH) Interventions SDOH Screenings   Food Insecurity: No Food Insecurity (04/18/2024)  Housing: Low Risk  (04/18/2024)  Transportation Needs: No Transportation Needs (04/18/2024)  Utilities: Not At Risk (04/18/2024)  Social Connections: Patient Declined (04/18/2024)  Tobacco Use: Low Risk  (04/18/2024)    Readmission Risk Interventions    01/09/2024   12:20 PM  Readmission Risk Prevention Plan  Transportation Screening Complete  PCP or Specialist Appt within 5-7 Days Complete  Home Care Screening Complete  Medication Review (RN CM) Complete

## 2024-04-22 NOTE — Consult Note (Addendum)
 Reason for Consult:AKI on CKD 3a Referring Physician: Stechschulte, Deward PARAS, MD   HPI:  Kathryn [name] is a 89 year old female with PMH below who presented as a non-leveled trauma after experiencing a ground-level fall. She reports losing her balance and falling backward onto her left side, striking a rocking chair. Initial evaluation was performed by the emergency department physician. She arrived in stable condition.  Imaging revealed left 6th-11th rib fractures with displacement and a small pneumothorax for which trauma surgery has been following since admission.   On admission, her serum creatinine was 1.20 mg/dL. Today, it has increased to 2.81 mg/dL, and nephrology was consulted for further evaluation and management of acute kidney injury  Cr was 1.1 on October 2025  I personally interviewed and examined Kathryn [name].  I also reviewed her EMR and found that she had been taking Olmesartan 40 mg daily prior to admission.  She does have a h/o CKD stage IIIa, HTN, HLD, CHF, GERD, h/o complete heart block s/p PPM placement, and anemia who presented to [facility] ED via [facility] on 04/18/24 after a mechanical fall at home and hit her left side on a rocking chair and developed left lateral rib pain, worse with movement.  In the ED, Temp 97.3, Bp 176/86, HR 70, RR 18, SpO2 100%.  Labs notable for Hgb 10.1, WBC 15.5, BUN 30, Cr 1.3, gluc 136.  CT scan with IV contrast revealed left 6-11 rib fractures with displacement and small pneumothorax.  She was admitted to trauma service and we were consulted after developing oliguric AKI.  The trend in Scr is seen below.  Trend in Creatinine:  Creatinine, Ser  Date/Time Value Ref Range Status  04/22/2024 03:14 AM 2.81 (H) 0.44 - 1.00 mg/dL Final  89/85/7974 90:52 AM 2.44 (H) 0.44 - 1.00 mg/dL Final  89/87/7974 95:66 AM 1.13 (H) 0.44 - 1.00 mg/dL Final  89/88/7974 92:76 PM 1.17 (H) 0.44 - 1.00 mg/dL Final  89/88/7974 97:84 PM 1.30 (H) 0.44 - 1.00 mg/dL Final   89/88/7974 97:89 PM 1.20 (H) 0.44 - 1.00 mg/dL Final  89/90/7974 1.1 0.6 - 1.3 mg/dL   92/95/7974 96:80 AM 8.55 (H) 0.44 - 1.00 mg/dL Final  92/96/7974 96:54 AM 1.25 (H) 0.44 - 1.00 mg/dL Final  92/97/7974 96:83 PM 1.25 (H) 0.44 - 1.00 mg/dL Final  93/79/7974 97:69 AM 1.26 (H) 0.44 - 1.00 mg/dL Final  93/80/7974 97:56 AM 1.37 (H) 0.44 - 1.00 mg/dL Final  93/81/7974 97:53 AM 1.20 (H) 0.44 - 1.00 mg/dL Final  93/83/7974 90:40 PM 1.22 (H) 0.44 - 1.00 mg/dL Final  95/92/7974 96:60 PM 1.24 (H) 0.57 - 1.00 mg/dL Final  96/82/7974 95:75 PM 1.04 (H) 0.57 - 1.00 mg/dL Final  97/85/7974 95:84 PM 1.08 (H) 0.57 - 1.00 mg/dL Final  91/91/7983 87:99 PM 0.84 0.44 - 1.00 mg/dL Final  88/84/7984 93:43 AM 0.80 0.50 - 1.10 mg/dL Final  88/85/7984 96:49 AM 0.91 0.50 - 1.10 mg/dL Final  88/90/7984 87:64 PM 1.11 (H) 0.50 - 1.10 mg/dL Final  97/74/7984 94:76 AM 0.81 0.50 - 1.10 mg/dL Final  97/77/7984 91:69 AM 0.91 0.50 - 1.10 mg/dL Final  97/78/7984 96:76 AM 1.02 0.50 - 1.10 mg/dL Final  97/79/7984 96:79 AM 0.90 0.50 - 1.10 mg/dL Final  97/80/7984 88:49 AM 0.87 0.50 - 1.10 mg/dL Final  96/76/7988 97:63 PM 0.90 0.4 - 1.2 mg/dL Final  91/76/7991 90:69 AM 0.82  Final    PMH:   Past Medical History:  Diagnosis Date   Arthritis  HANDS   Bradycardia    CKD stage 3a, GFR 45-59 ml/min (HCC) 12/24/2023   DVT (deep venous thrombosis) (HCC) 2010   Dysrhythmia    HX OF PAROXYSMAL ATRIAL FIB- TAKES ASPIRIN  FOR BLOOD THINNER   GERD (gastroesophageal reflux disease)    Hypertension    Peripheral vascular disease    Blood clot behind Right knee   Presence of permanent cardiac pacemaker    COMPLETE HEART BLOCK--DR. CROITORU     PSH:   Past Surgical History:  Procedure Laterality Date   ABDOMINAL HYSTERECTOMY     APPENDECTOMY     AUGMENTATION MAMMAPLASTY Bilateral    BREAST ENHANCEMENT SURGERY     BREAST IMPLANT REMOVAL Bilateral 08/28/2017   Procedure: REMOVAL BREAST IMPLANTS BILATERAL;  Surgeon:  Lowery Estefana RAMAN, DO;  Location: [facility];  Service: Plastics;  Laterality: Bilateral;   CATARACT EXTRACTION, BILATERAL     CHOLECYSTECTOMY     COLONOSCOPY N/A 03/05/2014   Procedure: COLONOSCOPY;  Surgeon: Alm VEAR Angle, MD;  Location: [facility];  Service: General;  Laterality: N/A;   COLOSTOMY CLOSURE N/A 05/21/2014   Procedure: LAPAROSCOPIC ASSISTED CLOSURE OF COLOSTOMY AND REPAIR OF TRANSVERSE COLON AND ENTEROTOMY;  Surgeon: Alm Angle, MD;  Location: [facility];  Service: General;  Laterality: N/A;   EP IMPLANTABLE DEVICE N/A 10/04/2015   Procedure: PPM/BIV PPM Generator Changeout;  Surgeon: Jerel Balding, MD;  Location: [facility];  Service: Cardiovascular;  Laterality: N/A;   LAPAROTOMY N/A 08/27/2013   Procedure: LAPAROSCOPY CONVERTED TO OPEN LAPAROTOMY WITH SIGMOID COLECTOMY AND COLOSTOMY;  Surgeon: Alm VEAR Angle, MD;  Location: [facility];  Service: General;  Laterality: N/A;   OPEN REDUCTION INTERNAL FIXATION (ORIF) DISTAL RADIAL FRACTURE Left 02/15/2015   Procedure: OPEN TREATMENT OF LEFT DISTAL RADIUS FRACTURE;  Surgeon: Alm Hummer, MD;  Location: [facility];  Service: Orthopedics;  Laterality: Left;   PACEMAKER INSERTION  12/02/1998   medtronic   PERMANENT PACEMAKER GENERATOR CHANGE  02/28/2007   Medtronic Adapta   TONSILLECTOMY     US  ECHOCARDIOGRAPHY  10/09/2011   mild LAE, mild MR,mild to mod TR    Allergies:  Allergies  Allergen Reactions   Diovan [Valsartan] Other (See Comments)    Dizziness Unsteady gait   Ketamine  Other (See Comments)    Altered mental state Hallucinations    Lipitor [Atorvastatin] Other (See Comments)    Myalgias   Aldactone  [Spironolactone ] Other (See Comments)    Dizziness    Codeine Other (See Comments)    Unknown reaction    Darvon-N [Propoxyphene] Other (See Comments)    Hallucinations   Procaine Other (See Comments)    Unknown reaction    Roxicodone [Oxycodone] Other (See Comments)     Hallucinations    Boniva [Ibandronate] Other (See Comments)    Difficulty swallowing Concerns about side-effects   Epipen  2-Pak [Epinephrine ] Palpitations and Other (See Comments)    Extreme tachycardia (has pacemaker)   Iron  Diarrhea and Nausea Only   Levaquin [Levofloxacin] Other (See Comments)    Shoulder pain  Able to take Cipro with no issues   Proton Pump Inhibitors Diarrhea    Medications:   Prior to Admission medications   Medication Sig Start Date End Date Taking? Authorizing Provider  acetaminophen  (TYLENOL ) 500 MG tablet Take 1 tablet (500 mg total) by mouth every 6 (six) hours as needed. 02/09/15  Yes Hummer Alm, MD  Ascorbic Acid (VITAMIN C PO) Take 1 tablet by mouth daily.   Yes  [provider]  CALCIUM PO Take 1 tablet by mouth daily.   Yes [provider]  carvedilol  (COREG ) 12.5 MG tablet Take 1 tablet (12.5 mg total) by mouth 2 (two) times daily. 08/23/23 01/08/25 Yes Vicci Rollo SAUNDERS, PA-C  Cholecalciferol  (VITAMIN D -3 PO) Take 1 tablet by mouth daily.   Yes [provider]  acetaminophen  (TYLENOL ) 500 MG tablet Take 2 tablets (1,000 mg total) by mouth every 6 (six) hours. 04/21/24   Vicci Burnard SAUNDERS, PA-C  docusate sodium  (COLACE) 100 MG capsule Take 1 capsule (100 mg total) by mouth daily as needed for mild constipation. 04/21/24   Vicci Burnard SAUNDERS, PA-C  methocarbamol (ROBAXIN) 500 MG tablet Take 1 tablet (500 mg total) by mouth every 8 (eight) hours as needed for muscle spasms. 04/21/24   Vicci Burnard SAUNDERS, PA-C  polyethylene glycol (MIRALAX / GLYCOLAX) 17 g packet Take 17 g by mouth daily as needed (constipation). 04/21/24   Vicci Burnard SAUNDERS, PA-C  traMADol  (ULTRAM ) 50 MG tablet Take 0.5 tablets (25 mg total) by mouth every 6 (six) hours as needed for moderate pain (pain score 4-6). 04/21/24   Johnson, Kelly R, PA-C    Inpatient medications:  acetaminophen   1,000 mg Oral Q6H   docusate sodium   100 mg Oral BID   methocarbamol   500 mg Oral Q8H    Discontinued Meds:   Medications Discontinued During This Encounter  Medication Reason   aspirin  EC 81 MG tablet Discontinued by provider   calcium carbonate (SUPER CALCIUM) 1500 (600 Ca) MG TABS tablet Duplicate   cholecalciferol  (VITAMIN D ) 1000 units tablet Duplicate   CRANBERRY PO    EVENING PRIMROSE OIL PO Patient Preference   furosemide  (LASIX ) 20 MG tablet Discontinued by provider   ondansetron  (ZOFRAN -ODT) 4 MG disintegrating tablet Completed Course   doxycycline  (VIBRA -TABS) 100 MG tablet Completed Course   Polyethyl Glycol-Propyl Glycol (SYSTANE OP) Patient Preference   TART CHERRY PO Patient Preference   traMADol  (ULTRAM ) 50 MG tablet Discontinued by provider   carvedilol  (COREG ) tablet 12.5 mg    enoxaparin (LOVENOX) injection 30 mg    methocarbamol (ROBAXIN) injection 500 mg    morphine  (PF) 2 MG/ML injection 1 mg    0.9 %  sodium chloride  infusion     Social History:  reports that she has never smoked. She has never used smokeless tobacco. She reports that she does not drink alcohol and does not use drugs.  Family History:   Family History  Problem Relation Age of Onset   Heart disease Mother    Heart disease Father    Breast cancer Neg Hx     Pertinent items are noted in HPI. Weight change:   Intake/Output Summary (Last 24 hours) at 04/22/2024 1348 Last data filed at 04/22/2024 1209 Gross per 24 hour  Intake 420 ml  Output 70 ml  Net 350 ml   BP (!) 102/54 (BP Location: Right Arm)   Pulse 77   Temp (!) 97.5 F (36.4 C) (Oral)   Resp 20   Ht 5' 4 (1.626 m)   Wt 52.2 kg   SpO2 91%   BMI 19.74 kg/m  Vitals:   04/22/24 0306 04/22/24 0404 04/22/24 0821 04/22/24 1118  BP: (!) 89/56 (!) 93/54 (!) 93/55 (!) 102/54  Pulse: 76 70 71 77  Resp: (!) 21 (!) 24 20 20   Temp: 97.6 F (36.4 C)  97.7 F (36.5 C) (!) 97.5 F (36.4 C)  TempSrc: Oral  Oral Oral  SpO2: 95% 94% 95% 91%  Weight:      Height:         General appearance:  alert, cooperative, fatigued, no distress, and ill appearing Head: Normocephalic, without obvious abnormality, atraumatic Resp: clear to auscultation bilaterally and Sating well on RA  Chest wall: no tenderness Cardio: Paced rhythm,regular rate  Extremities: no edema, redness or tenderness in the calves or thighs Skin: Warm and Dry. Poor skin turgor on chest upper and lower extremities   Labs: Basic Metabolic Panel: Recent Labs  Lab 04/18/24 1410 04/18/24 1415 04/18/24 1923 04/19/24 0433 04/21/24 0947 04/22/24 0314  NA 134* 136  --  135 133* 131*  K 4.4 4.4  --  4.0 4.5 4.5  CL 100 100  --  101 101 101  CO2 23  --   --  25 17* 17*  GLUCOSE 134* 136*  --  105* 106* 110*  BUN 26* 30*  --  27* 49* 59*  CREATININE 1.20* 1.30* 1.17* 1.13* 2.44* 2.81*  ALBUMIN 3.2*  --   --   --   --   --   CALCIUM 9.2  --   --  8.7* 8.4* 8.1*   Liver Function Tests: Recent Labs  Lab 04/18/24 1410  AST 27  ALT 15  ALKPHOS 69  BILITOT 1.3*  PROT 6.6  ALBUMIN 3.2*   Recent Labs  Lab 04/18/24 1410  LIPASE 34   No results for input(s): AMMONIA in the last 168 hours. CBC: Recent Labs  Lab 04/18/24 1410 04/18/24 1415 04/18/24 1923 04/19/24 0433 04/21/24 0947 04/22/24 0314  WBC 15.5*  --  13.4* 9.6 15.1* 11.0*  NEUTROABS 13.6*  --   --   --   --   --   HGB 10.1*   < > 9.1* 8.1* 9.2* 8.5*  HCT 31.5*   < > 27.8* 24.6* 28.7* 26.6*  MCV 93.5  --  92.7 91.1 94.1 92.4  PLT 257  --  212 185 233 243   < > = values in this interval not displayed.   PT/INR: @LABRCNTIP (inr:5) Cardiac Enzymes: ) Recent Labs  Lab 04/22/24 0314  CKTOTAL 70   CBG: No results for input(s): GLUCAP in the last 168 hours.  Iron  Studies: No results for input(s): IRON , TIBC, TRANSFERRIN, FERRITIN in the last 168 hours.  Xrays/Other Studies: US  RENAL Result Date: 04/22/2024 CLINICAL DATA:  Acute kidney injury. EXAM: RENAL / URINARY TRACT ULTRASOUND COMPLETE COMPARISON:  CT scan 04/18/2024  FINDINGS: Right Kidney: Renal measurements: 8.1 x 5.3 x 4.0 cm = volume: 89 mL. Echogenic focus in the central kidney although no calcified stone seen on CT scan 4 days ago. No hydronephrosis. Cortical scarring evident. Left Kidney: Renal measurements: 7.7 x 4.8 x 4.0 cm = volume: 76 mL. No hydronephrosis. Renal parenchymal echogenicity within normal limits. Areas of cortical thinning compatible scarring. Bladder: Appears normal for degree of bladder distention. Other: None. IMPRESSION: 1. No hydronephrosis. 2. Bilateral renal cortical scarring. Electronically Signed   By: Camellia Candle M.D.   On: 04/22/2024 12:34   DG CHEST PORT 1 VIEW Result Date: 04/21/2024 EXAM: 1 VIEW(S) XRAY OF THE CHEST 04/21/2024 04:19:00 PM COMPARISON: None available. CLINICAL HISTORY: Leukocytosis. FINDINGS: LINES, TUBES AND DEVICES: Right-sided pacemaker is unchanged. LUNGS AND PLEURA: Bilateral pleural effusions are noted with associated atelectasis. No pulmonary edema. No pneumothorax. HEART AND MEDIASTINUM: No acute abnormality of the cardiac and mediastinal silhouettes. BONES AND SOFT TISSUES: Stable left rib fractures. Stable subcutaneous emphysema over left  lateral chest wall. IMPRESSION: 1. Stable left rib fractures; subcutaneous emphysema over the left lateral chest wall. 2. Bilateral pleural effusions with associated atelectasis. 3. Right-sided pacemaker, unchanged. Electronically signed by: Lynwood Seip MD 04/21/2024 05:32 PM EDT RP Workstation: [id number]     Assessment/Plan  Patient with acute kidney injury, with serum creatinine rising from 1.20 to 2.81 mg/dL, BUN 59 <- 26,  likely multifactorial but primarily pre-renal due to decreased renal perfusion in the setting of trauma and poor oral intake. Renal ultrasound showed no hydronephrosis but findings consistent with bilateral renal cortical scarring. Urinalysis showed no evidence of infection or casts. Will treat as pre-renal AKI at this time and continue to monitor  renal function closely.CK is normal. She is hypotensive for many days, suspecting ischemic ATN in the setting of hypotension . Considering contrast nephropathy as well  as she got contrast.   AKI on CKD 3a      Contrast Nephropathy ??? - Give 1 L fluid LR Bolus - Avoid overhydration given age and recent rib fractures - Been getting fluids but made minimal urine output ,will bladder scan her  - Encourage PO intake  - Hold nephrotoxic agents - Avoid morphine ; use hydromorphone  or fentanyl  if analgesia required. - Strict I/O monitoring - Trend renal function and electrolytes - Will also get FENa  NAGMA - Due to acute kidney injury - I expect improving as kidney function gets better - No need to bicarbonate at this time  - Will continue to monitor  Hypovolemia Hyponatremia   - Mild elevation at 131?mg/dL with no apparent symptoms; expected to improve with adequate oral intake. - Will continue to monitor closely.  Dispo: AKI, likely pre-renal due to decreased perfusion in the setting of trauma and reduced oral intake.  Likely multifactorial, with pre-renal insult from decreased fluid intake/volume depletion, IV contrast in setting of concomitant ARB therapy, and prolonged relative hypotension (SBP's in the 80's and 90's since 04/20/24 through today) precipitating ischemic ATN.  Continue supportive care, avoid nephrotoxins, and follow renal function closely.   Drue Grow 04/22/2024, 1:48 PM    I have seen and examined this patient and agree with plan and assessment in the above note with renal recommendations/intervention highlighted. Multiple renal insults as above.  Will stop IV NS and give bolus of LR and follow UOP and renal function.  No urgent indication for HD and hopefully will start to see improved UOP and Scr.   - Avoid nephrotoxic medications including NSAIDs and iodinated intravenous contrast exposure unless the latter is absolutely indicated.   -Preferred narcotic agents for  pain control are hydromorphone , fentanyl , and methadone. Morphine  should not be used.  -Avoid Baclofen and avoid oral sodium phosphate  and magnesium citrate based laxatives / bowel preps.  -Continue strict Input and Output monitoring.  -Will monitor the patient closely with you and intervene or adjust therapy as indicated by changes in clinical status/labs   Fairy LABOR Lamari Beckles,MD 04/22/2024 2:26 PM

## 2024-04-22 NOTE — Progress Notes (Signed)
 Progress Note     Subjective: Pt with minimal UOP overnight, UA not suggestive of UTI. Total of 1.75 liters given slowly in boluses yesterday and now on 100 cc/h MIVF. Pt alert this AM but reports feeling tired. Denies pain.   Objective: Vital signs in last 24 hours: Temp:  [97.6 F (36.4 C)-98.6 F (37 C)] 97.6 F (36.4 C) (10/15 0306) Pulse Rate:  [69-92] 70 (10/15 0404) Resp:  [18-25] 24 (10/15 0404) BP: (85-93)/(47-61) 93/54 (10/15 0404) SpO2:  [94 %-97 %] 94 % (10/15 0404) Last BM Date : 04/18/24  Intake/Output from previous day: 10/14 0701 - 10/15 0700 In: 240 [P.O.:240] Out: 70 [Urine:70] Intake/Output this shift: No intake/output data recorded.  PE: General: pleasant, WD, elderly female who is laying in bed in NAD Heart: regular, rate, and rhythm.  Lungs: CTAB, no wheezes, rhonchi, or rales noted.  Respiratory effort nonlabored Abd: soft, NT, ND, no masses, hernias, or organomegaly Neuro: Cranial nerves 2-12 grossly intact, sensation is normal throughout Psych: A&Ox3 with an appropriate affect.    Lab Results:  Recent Labs    04/21/24 0947 04/22/24 0314  WBC 15.1* 11.0*  HGB 9.2* 8.5*  HCT 28.7* 26.6*  PLT 233 243   BMET Recent Labs    04/21/24 0947 04/22/24 0314  NA 133* 131*  K 4.5 4.5  CL 101 101  CO2 17* 17*  GLUCOSE 106* 110*  BUN 49* 59*  CREATININE 2.44* 2.81*  CALCIUM 8.4* 8.1*   PT/INR No results for input(s): LABPROT, INR in the last 72 hours. CMP     Component Value Date/Time   NA 131 (L) 04/22/2024 0314   NA 139 10/14/2023 1539   K 4.5 04/22/2024 0314   CL 101 04/22/2024 0314   CO2 17 (L) 04/22/2024 0314   GLUCOSE 110 (H) 04/22/2024 0314   BUN 59 (H) 04/22/2024 0314   BUN 28 10/14/2023 1539   CREATININE 2.81 (H) 04/22/2024 0314   CREATININE 1.00 (H) 09/27/2015 1426   CALCIUM 8.1 (L) 04/22/2024 0314   PROT 6.6 04/18/2024 1410   ALBUMIN 3.2 (L) 04/18/2024 1410   AST 27 04/18/2024 1410   ALT 15 04/18/2024 1410    ALKPHOS 69 04/18/2024 1410   BILITOT 1.3 (H) 04/18/2024 1410   GFRNONAA 15 (L) 04/22/2024 0314   GFRAA >60 02/14/2015 1200   Lipase     Component Value Date/Time   LIPASE 34 04/18/2024 1410       Studies/Results: DG CHEST PORT 1 VIEW Result Date: 04/21/2024 EXAM: 1 VIEW(S) XRAY OF THE CHEST 04/21/2024 04:19:00 PM COMPARISON: None available. CLINICAL HISTORY: Leukocytosis. FINDINGS: LINES, TUBES AND DEVICES: Right-sided pacemaker is unchanged. LUNGS AND PLEURA: Bilateral pleural effusions are noted with associated atelectasis. No pulmonary edema. No pneumothorax. HEART AND MEDIASTINUM: No acute abnormality of the cardiac and mediastinal silhouettes. BONES AND SOFT TISSUES: Stable left rib fractures. Stable subcutaneous emphysema over left lateral chest wall. IMPRESSION: 1. Stable left rib fractures; subcutaneous emphysema over the left lateral chest wall. 2. Bilateral pleural effusions with associated atelectasis. 3. Right-sided pacemaker, unchanged. Electronically signed by: Lynwood Seip MD 04/21/2024 05:32 PM EDT RP Workstation: [id number]    Anti-infectives: Anti-infectives (From admission, onward)    None        Assessment/Plan  GLF L 6-11 rib fxs with tract PTX - no PTX noted on CXR from 10/12, multimodal pain control, IS AKI on CKD IIIa - No nephrotoxic agents, Cr 2.8 from 2.4, UOP minimal. UA not terribly remarkable. Given 750  cc throughout day yesterday and another slow liter overnight. Give albumin today and continue MIVF, encourage PO liquids  FEN: reg diet, IVF @100  cc/h, albumin  VTE: LMWH held ID: no current abx  Dispo: 4NP, monitor UOP. If continues to be marginal may need nephrology consult.   LOS: 4 days   I reviewed last 24 h vitals and pain scores, last 48 h intake and output, last 24 h labs and trends, and last 24 h imaging results.  This care required moderate level of medical decision making.    Burnard JONELLE Louder, [facility]  Surgery 04/22/2024, 8:16 AM Please see Amion for pager number during day hours 7:00am-4:30pm

## 2024-04-23 LAB — RENAL FUNCTION PANEL
Albumin: 2.4 g/dL — ABNORMAL LOW (ref 3.5–5.0)
Anion gap: 13 (ref 5–15)
BUN: 70 mg/dL — ABNORMAL HIGH (ref 8–23)
CO2: 16 mmol/L — ABNORMAL LOW (ref 22–32)
Calcium: 8.4 mg/dL — ABNORMAL LOW (ref 8.9–10.3)
Chloride: 102 mmol/L (ref 98–111)
Creatinine, Ser: 2.8 mg/dL — ABNORMAL HIGH (ref 0.44–1.00)
GFR, Estimated: 15 mL/min — ABNORMAL LOW (ref >60)
Glucose, Bld: 121 mg/dL — ABNORMAL HIGH (ref 70–99)
Phosphorus: 5 mg/dL — ABNORMAL HIGH (ref 2.5–4.6)
Potassium: 4.5 mmol/L (ref 3.5–5.1)
Sodium: 131 mmol/L — ABNORMAL LOW (ref 135–145)

## 2024-04-23 LAB — CBC
HCT: 29.2 % — ABNORMAL LOW (ref 36.0–46.0)
Hemoglobin: 9.2 g/dL — ABNORMAL LOW (ref 12.0–15.0)
MCH: 30 pg (ref 26.0–34.0)
MCHC: 31.5 g/dL (ref 30.0–36.0)
MCV: 95.1 fL (ref 80.0–100.0)
Platelets: 267 K/uL (ref 150–400)
RBC: 3.07 MIL/uL — ABNORMAL LOW (ref 3.87–5.11)
RDW: 14.3 % (ref 11.5–15.5)
WBC: 13.2 K/uL — ABNORMAL HIGH (ref 4.0–10.5)
nRBC: 0.2 % (ref 0.0–0.2)

## 2024-04-23 MED ORDER — SODIUM CHLORIDE 0.9 % IV BOLUS
1000.0000 mL | Freq: Once | INTRAVENOUS | Status: AC
  Administered 2024-04-23: 1000 mL via INTRAVENOUS

## 2024-04-23 MED ORDER — ALBUMIN HUMAN 25 % IV SOLN
25.0000 g | Freq: Once | INTRAVENOUS | Status: AC
  Administered 2024-04-23: 25 g via INTRAVENOUS
  Filled 2024-04-23: qty 100

## 2024-04-23 MED ORDER — HEPARIN SODIUM (PORCINE) 5000 UNIT/ML IJ SOLN
5000.0000 [IU] | Freq: Three times a day (TID) | INTRAMUSCULAR | Status: DC
  Administered 2024-04-23 – 2024-04-29 (×18): 5000 [IU] via SUBCUTANEOUS
  Filled 2024-04-23 (×19): qty 1

## 2024-04-23 NOTE — Progress Notes (Signed)
 Called patient's son Darina to provide clinical update. Offered the option to include the patient's other two children, which he declined, stating they were not immediately available. Discussion held with Darina regarding the patient's current state of failure to thrive and its manifestations. We discussed my concern that this may be the early stage of dying. I suggested for his consideration the possibility of changing dispo plan from SNF to hospice. He seemed open to this idea. He will discuss with his siblings and reach back out for a group phone call later today.   Dreama GEANNIE Hanger, MD General and Trauma Surgery [facility]

## 2024-04-23 NOTE — TOC Progression Note (Addendum)
 Transition of Care [facility]) - Progression Note    Patient Details  Name: Kathryn [name] MRN: [id number] Date of Birth: 1928-01-23  [facility]) CM/SW Contact  Jamontae Thwaites E Tarquin Welcher, LCSW Phone Number: 04/23/2024, 9:07 AM  Clinical Narrative:    Called [facility] with [facility] - she states they are no longer able to offer a bed. Called son Darina  - his next choice is [facility]. CSW called Levon in Admissions at [facility], she states they can accept patient tomorrow pending auth. If patient is not ready tomorrow, they should be able to accept patient on Saturday.  Asked MD when patient will be ready. Need to start auth when appropriate.   Expected Discharge Plan: Skilled Nursing Facility Barriers to Discharge: Continued Medical Work up               Expected Discharge Plan and Services       Living arrangements for the past 2 months: Single Family Home                                       Social Drivers of Health (SDOH) Interventions SDOH Screenings   Food Insecurity: No Food Insecurity (04/18/2024)  Housing: Low Risk  (04/18/2024)  Transportation Needs: No Transportation Needs (04/18/2024)  Utilities: Not At Risk (04/18/2024)  Social Connections: Patient Declined (04/18/2024)  Tobacco Use: Low Risk  (04/18/2024)    Readmission Risk Interventions    01/09/2024   12:20 PM  Readmission Risk Prevention Plan  Transportation Screening Complete  PCP or Specialist Appt within 5-7 Days Complete  Home Care Screening Complete  Medication Review (RN CM) Complete

## 2024-04-23 NOTE — Plan of Care (Signed)
  Problem: Education: Goal: Knowledge of General Education information will improve Description: Including pain rating scale, medication(s)/side effects and non-pharmacologic comfort measures Outcome: Progressing   Problem: Health Behavior/Discharge Planning: Goal: Ability to manage health-related needs will improve Outcome: Progressing   Problem: Clinical Measurements: Goal: Ability to maintain clinical measurements within normal limits will improve Outcome: Progressing Goal: Will remain free from infection Outcome: Progressing Goal: Diagnostic test results will improve Outcome: Progressing Goal: Respiratory complications will improve Outcome: Progressing Goal: Cardiovascular complication will be avoided Outcome: Progressing   Problem: Activity: Goal: Risk for activity intolerance will decrease Outcome: Progressing   Problem: Coping: Goal: Level of anxiety will decrease Outcome: Progressing   Problem: Elimination: Goal: Will not experience complications related to bowel motility Outcome: Progressing   Problem: Pain Managment: Goal: General experience of comfort will improve and/or be controlled Outcome: Progressing   Problem: Safety: Goal: Ability to remain free from injury will improve Outcome: Progressing   Problem: Skin Integrity: Goal: Risk for impaired skin integrity will decrease Outcome: Progressing

## 2024-04-23 NOTE — Progress Notes (Signed)
 Trauma/Critical Care Follow Up Note  Subjective:    Overnight Issues:   Objective:  Vital signs for last 24 hours: Temp:  [96.9 F (36.1 C)-97.9 F (36.6 C)] 97.8 F (36.6 C) (10/16 0333) Pulse Rate:  [71-77] 72 (10/16 0333) Resp:  [12-22] 22 (10/16 0333) BP: (82-115)/(54-72) 115/67 (10/16 0333) SpO2:  [91 %-95 %] 92 % (10/16 0333)  Hemodynamic parameters for last 24 hours:    Intake/Output from previous day: 10/15 0701 - 10/16 0700 In: 3376 [P.O.:180; I.V.:3196] Out: 300 [Urine:300]  Intake/Output this shift: No intake/output data recorded.  Vent settings for last 24 hours:    Physical Exam:  Gen: comfortable, no distress Neuro: follows commands, alert, communicative HEENT: PERRL Neck: supple CV: RRR Pulm: unlabored breathing on RA Abd: soft, NT  ,   GU: urine clear and yellow, +spontaneous voids Extr: wwp, no edema  Results for orders placed or performed during the hospital encounter of 04/18/24 (from the past 24 hours)  Comprehensive metabolic panel     Status: Abnormal   Collection Time: 04/22/24  7:29 PM  Result Value Ref Range   Sodium 132 (L) 135 - 145 mmol/L   Potassium 4.5 3.5 - 5.1 mmol/L   Chloride 100 98 - 111 mmol/L   CO2 18 (L) 22 - 32 mmol/L   Glucose, Bld 137 (H) 70 - 99 mg/dL   BUN 66 (H) 8 - 23 mg/dL   Creatinine, Ser 6.93 (H) 0.44 - 1.00 mg/dL   Calcium 8.1 (L) 8.9 - 10.3 mg/dL   Total Protein 5.4 (L) 6.5 - 8.1 g/dL   Albumin 2.4 (L) 3.5 - 5.0 g/dL   AST 27 15 - 41 U/L   ALT 18 0 - 44 U/L   Alkaline Phosphatase 89 38 - 126 U/L   Total Bilirubin 1.8 (H) 0.0 - 1.2 mg/dL   GFR, Estimated 14 (L) >60 mL/min   Anion gap 14 5 - 15  Renal function panel     Status: Abnormal   Collection Time: 04/23/24  5:36 AM  Result Value Ref Range   Sodium 131 (L) 135 - 145 mmol/L   Potassium 4.5 3.5 - 5.1 mmol/L   Chloride 102 98 - 111 mmol/L   CO2 16 (L) 22 - 32 mmol/L   Glucose, Bld 121 (H) 70 - 99 mg/dL   BUN 70 (H) 8 - 23 mg/dL   Creatinine,  Ser 7.19 (H) 0.44 - 1.00 mg/dL   Calcium 8.4 (L) 8.9 - 10.3 mg/dL   Phosphorus 5.0 (H) 2.5 - 4.6 mg/dL   Albumin 2.4 (L) 3.5 - 5.0 g/dL   GFR, Estimated 15 (L) >60 mL/min   Anion gap 13 5 - 15  CBC     Status: Abnormal   Collection Time: 04/23/24  5:36 AM  Result Value Ref Range   WBC 13.2 (H) 4.0 - 10.5 K/uL   RBC 3.07 (L) 3.87 - 5.11 MIL/uL   Hemoglobin 9.2 (L) 12.0 - 15.0 g/dL   HCT 70.7 (L) 63.9 - 53.9 %   MCV 95.1 80.0 - 100.0 fL   MCH 30.0 26.0 - 34.0 pg   MCHC 31.5 30.0 - 36.0 g/dL   RDW 85.6 88.4 - 84.4 %   Platelets 267 150 - 400 K/uL   nRBC 0.2 0.0 - 0.2 %    Assessment & Plan: The plan of care was discussed with the bedside nurse for the day, who is in agreement with this plan and no additional concerns were raised.  Present on Admission:  Trauma    LOS: 5 days   Additional comments:I reviewed the patient's new clinical lab test results.   and I reviewed the patients new imaging test results.    GLF  L 6-11 rib fxs with tract PTX - no PTX noted on CXR from 10/12, multimodal pain control, IS AKI on CKD IIIa - No nephrotoxic agents, Cr appears to have peaked and is now downtrending, UOP minimal but improved. UA not terribly remarkable. Add'l bolus this AM. Encourage PO liquids. Renal consulted yest, no additional recs.   FEN: reg diet  VTE: LMWH held, change to [facility] ID: no current abx   Dispo: 4NP, monitor UOP  Kathryn GEANNIE Hanger, MD Trauma & General Surgery Please use [email or web address] to contact on call provider  04/23/2024  *Care during the described time interval was provided by me. I have reviewed this patient's available data, including medical history, events of note, physical examination and test results as part of my evaluation.

## 2024-04-23 NOTE — Consult Note (Signed)
 Reason for Consult:AKI on CKD 3a Referring Physician: Stechschulte, Deward PARAS, MD   HPI:   Kathryn [name], a 89 year old woman with a history of chronic kidney disease stage IIIa, hypertension, hyperlipidemia, congestive heart failure, GERD, complete heart block with pacemaker placement, and anemia, presented to the emergency department after a ground-level fall at home, where she lost her balance and struck her left side on a rocking chair. On admission, she was stable, with imaging revealing displaced fractures of the left 6th to 11th ribs and a small pneumothorax, managed by trauma surgery. Her initial serum creatinine was 1.20 mg/dL but has since risen to 2.81 mg/dL, prompting nephrology consultation for oliguric acute kidney injury. She was taking Olmesartan 40 mg daily before admission. In the ED, her vital signs were stable with a blood pressure of 176/86, heart rate of 70, and oxygen saturation of 100%, while labs showed anemia, leukocytosis, elevated BUN, and glucose. A CT scan confirmed the rib fractures and pneumothorax, and her creatinine trend indicated worsening renal function requiring further evaluation.  Interval events : 10/16: The patient experienced desaturation in the 80s but was placed on 2L nasal cannula with good saturation afterward. There were no other acute events overnight, and she received a bolus of lactated Ringer 's solution yesterday.   Trend in Creatinine:  Creatinine, Ser  Date/Time Value Ref Range Status  04/22/2024 03:14 AM 2.81 (H) 0.44 - 1.00 mg/dL Final  89/85/7974 90:52 AM 2.44 (H) 0.44 - 1.00 mg/dL Final  89/87/7974 95:66 AM 1.13 (H) 0.44 - 1.00 mg/dL Final  89/88/7974 92:76 PM 1.17 (H) 0.44 - 1.00 mg/dL Final  89/88/7974 97:84 PM 1.30 (H) 0.44 - 1.00 mg/dL Final  89/88/7974 97:89 PM 1.20 (H) 0.44 - 1.00 mg/dL Final  89/90/7974 1.1 0.6 - 1.3 mg/dL   92/95/7974 96:80 AM 8.55 (H) 0.44 - 1.00 mg/dL Final  92/96/7974 96:54 AM 1.25 (H) 0.44 - 1.00 mg/dL  Final  92/97/7974 96:83 PM 1.25 (H) 0.44 - 1.00 mg/dL Final  93/79/7974 97:69 AM 1.26 (H) 0.44 - 1.00 mg/dL Final  93/80/7974 97:56 AM 1.37 (H) 0.44 - 1.00 mg/dL Final  93/81/7974 97:53 AM 1.20 (H) 0.44 - 1.00 mg/dL Final  93/83/7974 90:40 PM 1.22 (H) 0.44 - 1.00 mg/dL Final  95/92/7974 96:60 PM 1.24 (H) 0.57 - 1.00 mg/dL Final  96/82/7974 95:75 PM 1.04 (H) 0.57 - 1.00 mg/dL Final  97/85/7974 95:84 PM 1.08 (H) 0.57 - 1.00 mg/dL Final  91/91/7983 87:99 PM 0.84 0.44 - 1.00 mg/dL Final  88/84/7984 93:43 AM 0.80 0.50 - 1.10 mg/dL Final  88/85/7984 96:49 AM 0.91 0.50 - 1.10 mg/dL Final  88/90/7984 87:64 PM 1.11 (H) 0.50 - 1.10 mg/dL Final  97/74/7984 94:76 AM 0.81 0.50 - 1.10 mg/dL Final  97/77/7984 91:69 AM 0.91 0.50 - 1.10 mg/dL Final  97/78/7984 96:76 AM 1.02 0.50 - 1.10 mg/dL Final  97/79/7984 96:79 AM 0.90 0.50 - 1.10 mg/dL Final  97/80/7984 88:49 AM 0.87 0.50 - 1.10 mg/dL Final  96/76/7988 97:63 PM 0.90 0.4 - 1.2 mg/dL Final  91/76/7991 90:69 AM 0.82  Final    PMH:   Past Medical History:  Diagnosis Date   Arthritis    HANDS   Bradycardia    CKD stage 3a, GFR 45-59 ml/min (HCC) 12/24/2023   DVT (deep venous thrombosis) (HCC) 2010   Dysrhythmia    HX OF PAROXYSMAL ATRIAL FIB- TAKES ASPIRIN  FOR BLOOD THINNER   GERD (gastroesophageal reflux disease)    Hypertension    Peripheral vascular disease  Blood clot behind Right knee   Presence of permanent cardiac pacemaker    COMPLETE HEART BLOCK--DR. CROITORU     PSH:   Past Surgical History:  Procedure Laterality Date   ABDOMINAL HYSTERECTOMY     APPENDECTOMY     AUGMENTATION MAMMAPLASTY Bilateral    BREAST ENHANCEMENT SURGERY     BREAST IMPLANT REMOVAL Bilateral 08/28/2017   Procedure: REMOVAL BREAST IMPLANTS BILATERAL;  Surgeon: Lowery Estefana RAMAN, DO;  Location: [facility];  Service: Plastics;  Laterality: Bilateral;   CATARACT EXTRACTION, BILATERAL     CHOLECYSTECTOMY     COLONOSCOPY N/A 03/05/2014    Procedure: COLONOSCOPY;  Surgeon: Alm VEAR Angle, MD;  Location: [facility];  Service: General;  Laterality: N/A;   COLOSTOMY CLOSURE N/A 05/21/2014   Procedure: LAPAROSCOPIC ASSISTED CLOSURE OF COLOSTOMY AND REPAIR OF TRANSVERSE COLON AND ENTEROTOMY;  Surgeon: Alm Angle, MD;  Location: [facility];  Service: General;  Laterality: N/A;   EP IMPLANTABLE DEVICE N/A 10/04/2015   Procedure: PPM/BIV PPM Generator Changeout;  Surgeon: Jerel Balding, MD;  Location: [facility];  Service: Cardiovascular;  Laterality: N/A;   LAPAROTOMY N/A 08/27/2013   Procedure: LAPAROSCOPY CONVERTED TO OPEN LAPAROTOMY WITH SIGMOID COLECTOMY AND COLOSTOMY;  Surgeon: Alm VEAR Angle, MD;  Location: [facility];  Service: General;  Laterality: N/A;   OPEN REDUCTION INTERNAL FIXATION (ORIF) DISTAL RADIAL FRACTURE Left 02/15/2015   Procedure: OPEN TREATMENT OF LEFT DISTAL RADIUS FRACTURE;  Surgeon: Alm Hummer, MD;  Location: [facility];  Service: Orthopedics;  Laterality: Left;   PACEMAKER INSERTION  12/02/1998   medtronic   PERMANENT PACEMAKER GENERATOR CHANGE  02/28/2007   Medtronic Adapta   TONSILLECTOMY     US  ECHOCARDIOGRAPHY  10/09/2011   mild LAE, mild MR,mild to mod TR    Allergies:  Allergies  Allergen Reactions   Diovan [Valsartan] Other (See Comments)    Dizziness Unsteady gait   Ketamine  Other (See Comments)    Altered mental state Hallucinations    Lipitor [Atorvastatin] Other (See Comments)    Myalgias   Aldactone  [Spironolactone ] Other (See Comments)    Dizziness    Codeine Other (See Comments)    Unknown reaction    Darvon-N [Propoxyphene] Other (See Comments)    Hallucinations   Procaine Other (See Comments)    Unknown reaction    Roxicodone [Oxycodone] Other (See Comments)    Hallucinations    Boniva [Ibandronate] Other (See Comments)    Difficulty swallowing Concerns about side-effects   Epipen  2-Pak [Epinephrine ] Palpitations and Other (See Comments)     Extreme tachycardia (has pacemaker)   Iron  Diarrhea and Nausea Only   Levaquin [Levofloxacin] Other (See Comments)    Shoulder pain  Able to take Cipro with no issues   Proton Pump Inhibitors Diarrhea    Medications:   Prior to Admission medications   Medication Sig Start Date End Date Taking? Authorizing Provider  acetaminophen  (TYLENOL ) 500 MG tablet Take 1 tablet (500 mg total) by mouth every 6 (six) hours as needed. 02/09/15  Yes Hummer Alm, MD  Ascorbic Acid (VITAMIN C PO) Take 1 tablet by mouth daily.   Yes [provider]  CALCIUM PO Take 1 tablet by mouth daily.   Yes [provider]  carvedilol  (COREG ) 12.5 MG tablet Take 1 tablet (12.5 mg total) by mouth 2 (two) times daily. 08/23/23 01/08/25 Yes Vicci Rollo SAUNDERS, PA-C  Cholecalciferol  (VITAMIN D -3 PO) Take 1 tablet by mouth daily.   Yes [provider]  acetaminophen  (TYLENOL ) 500 MG tablet Take 2 tablets (1,000 mg total) by mouth every 6 (six) hours. 04/21/24   Vicci Burnard SAUNDERS, PA-C  docusate sodium  (COLACE) 100 MG capsule Take 1 capsule (100 mg total) by mouth daily as needed for mild constipation. 04/21/24   Vicci Burnard SAUNDERS, PA-C  methocarbamol (ROBAXIN) 500 MG tablet Take 1 tablet (500 mg total) by mouth every 8 (eight) hours as needed for muscle spasms. 04/21/24   Vicci Burnard SAUNDERS, PA-C  polyethylene glycol (MIRALAX / GLYCOLAX) 17 g packet Take 17 g by mouth daily as needed (constipation). 04/21/24   Vicci Burnard SAUNDERS, PA-C  traMADol  (ULTRAM ) 50 MG tablet Take 0.5 tablets (25 mg total) by mouth every 6 (six) hours as needed for moderate pain (pain score 4-6). 04/21/24   Johnson, Kelly R, PA-C    Inpatient medications:  acetaminophen   1,000 mg Oral Q6H   docusate sodium   100 mg Oral BID   heparin  injection (subcutaneous)  5,000 Units Subcutaneous Q8H   methocarbamol (ROBAXIN) injection  500 mg Intravenous Daily    Discontinued Meds:   Medications Discontinued During This Encounter   Medication Reason   aspirin  EC 81 MG tablet Discontinued by provider   calcium carbonate (SUPER CALCIUM) 1500 (600 Ca) MG TABS tablet Duplicate   cholecalciferol  (VITAMIN D ) 1000 units tablet Duplicate   CRANBERRY PO    EVENING PRIMROSE OIL PO Patient Preference   furosemide  (LASIX ) 20 MG tablet Discontinued by provider   ondansetron  (ZOFRAN -ODT) 4 MG disintegrating tablet Completed Course   doxycycline  (VIBRA -TABS) 100 MG tablet Completed Course   Polyethyl Glycol-Propyl Glycol (SYSTANE OP) Patient Preference   TART CHERRY PO Patient Preference   traMADol  (ULTRAM ) 50 MG tablet Discontinued by provider   carvedilol  (COREG ) tablet 12.5 mg    enoxaparin (LOVENOX) injection 30 mg    methocarbamol (ROBAXIN) injection 500 mg    morphine  (PF) 2 MG/ML injection 1 mg    0.9 %  sodium chloride  infusion    methocarbamol (ROBAXIN) tablet 500 mg     Social History:  reports that she has never smoked. She has never used smokeless tobacco. She reports that she does not drink alcohol and does not use drugs.  Family History:   Family History  Problem Relation Age of Onset   Heart disease Mother    Heart disease Father    Breast cancer Neg Hx     Pertinent items are noted in HPI. Weight change:   Intake/Output Summary (Last 24 hours) at 04/23/2024 0934 Last data filed at 04/23/2024 0347 Gross per 24 hour  Intake 3255.96 ml  Output 300 ml  Net 2955.96 ml   BP 115/67 (BP Location: Right Arm)   Pulse 72   Temp 97.8 F (36.6 C) (Oral)   Resp (!) 22   Ht 5' 4 (1.626 m)   Wt 52.2 kg   SpO2 92%   BMI 19.74 kg/m  Vitals:   04/22/24 1941 04/22/24 2302 04/23/24 0333 04/23/24 0801  BP: 105/61 103/72 115/67   Pulse: 71 75 72   Resp: (!) 21 (!) 21 (!) 22   Temp: (!) 96.9 F (36.1 C) 97.9 F (36.6 C) 97.8 F (36.6 C)   TempSrc: Axillary Axillary Oral Oral  SpO2: 93% 91% 92%   Weight:      Height:         General appearance: alert, cooperative, fatigued, no distress, and ill  appearing Head: Normocephalic, without obvious abnormality, atraumatic Resp: clear to  auscultation bilaterally and Sating well on RA  Chest wall: no tenderness Cardio: Paced rhythm,regular rate  Extremities: no edema, redness or tenderness in the calves or thighs Skin: Warm and Dry. Poor skin turgor but eveidence of third spacing in right arm mostly      Labs: Basic Metabolic Panel: Recent Labs  Lab 04/18/24 1410 04/18/24 1415 04/18/24 1923 04/19/24 0433 04/21/24 0947 04/22/24 0314 04/22/24 1929 04/23/24 0536  NA 134* 136  --  135 133* 131* 132* 131*  K 4.4 4.4  --  4.0 4.5 4.5 4.5 4.5  CL 100 100  --  101 101 101 100 102  CO2 23  --   --  25 17* 17* 18* 16*  GLUCOSE 134* 136*  --  105* 106* 110* 137* 121*  BUN 26* 30*  --  27* 49* 59* 66* 70*  CREATININE 1.20* 1.30* 1.17* 1.13* 2.44* 2.81* 3.06* 2.80*  ALBUMIN 3.2*  --   --   --   --   --  2.4* 2.4*  CALCIUM 9.2  --   --  8.7* 8.4* 8.1* 8.1* 8.4*  PHOS  --   --   --   --   --   --   --  5.0*   Liver Function Tests: Recent Labs  Lab 04/18/24 1410 04/22/24 1929 04/23/24 0536  AST 27 27  --   ALT 15 18  --   ALKPHOS 69 89  --   BILITOT 1.3* 1.8*  --   PROT 6.6 5.4*  --   ALBUMIN 3.2* 2.4* 2.4*   Recent Labs  Lab 04/18/24 1410  LIPASE 34   No results for input(s): AMMONIA in the last 168 hours. CBC: Recent Labs  Lab 04/18/24 1410 04/18/24 1415 04/19/24 0433 04/21/24 0947 04/22/24 0314 04/23/24 0536  WBC 15.5*   < > 9.6 15.1* 11.0* 13.2*  NEUTROABS 13.6*  --   --   --   --   --   HGB 10.1*   < > 8.1* 9.2* 8.5* 9.2*  HCT 31.5*   < > 24.6* 28.7* 26.6* 29.2*  MCV 93.5   < > 91.1 94.1 92.4 95.1  PLT 257   < > 185 233 243 267   < > = values in this interval not displayed.   PT/INR: @LABRCNTIP (inr:5) Cardiac Enzymes: ) Recent Labs  Lab 04/22/24 0314  CKTOTAL 70   CBG: No results for input(s): GLUCAP in the last 168 hours.  Iron  Studies: No results for input(s): IRON , TIBC, TRANSFERRIN,  FERRITIN in the last 168 hours.  Xrays/Other Studies: US  RENAL Result Date: 04/22/2024 CLINICAL DATA:  Acute kidney injury. EXAM: RENAL / URINARY TRACT ULTRASOUND COMPLETE COMPARISON:  CT scan 04/18/2024 FINDINGS: Right Kidney: Renal measurements: 8.1 x 5.3 x 4.0 cm = volume: 89 mL. Echogenic focus in the central kidney although no calcified stone seen on CT scan 4 days ago. No hydronephrosis. Cortical scarring evident. Left Kidney: Renal measurements: 7.7 x 4.8 x 4.0 cm = volume: 76 mL. No hydronephrosis. Renal parenchymal echogenicity within normal limits. Areas of cortical thinning compatible scarring. Bladder: Appears normal for degree of bladder distention. Other: None. IMPRESSION: 1. No hydronephrosis. 2. Bilateral renal cortical scarring. Electronically Signed   By: Camellia Candle M.D.   On: 04/22/2024 12:34   DG CHEST PORT 1 VIEW Result Date: 04/21/2024 EXAM: 1 VIEW(S) XRAY OF THE CHEST 04/21/2024 04:19:00 PM COMPARISON: None available. CLINICAL HISTORY: Leukocytosis. FINDINGS: LINES, TUBES AND DEVICES: Right-sided pacemaker is unchanged. LUNGS  AND PLEURA: Bilateral pleural effusions are noted with associated atelectasis. No pulmonary edema. No pneumothorax. HEART AND MEDIASTINUM: No acute abnormality of the cardiac and mediastinal silhouettes. BONES AND SOFT TISSUES: Stable left rib fractures. Stable subcutaneous emphysema over left lateral chest wall. IMPRESSION: 1. Stable left rib fractures; subcutaneous emphysema over the left lateral chest wall. 2. Bilateral pleural effusions with associated atelectasis. 3. Right-sided pacemaker, unchanged. Electronically signed by: Lynwood Seip MD 04/21/2024 05:32 PM EDT RP Workstation: [id number]     Assessment/Plan  Patient with acute kidney injury, with serum creatinine rising from 1.20 to 2.81 mg/dL, BUN 59 <- 26,  likely multifactorial but primarily pre-renal due to decreased renal perfusion in the setting of trauma and poor oral intake. Renal  ultrasound showed no hydronephrosis but findings consistent with bilateral renal cortical scarring. Urinalysis showed no evidence of infection or casts. Will treat as pre-renal AKI at this time and continue to monitor renal function closely.CK is normal. She is hypotensive for many days, suspecting ischemic ATN in the setting of hypotension . Considering contrast nephropathy as well  as she got contrast.   AKI on CKD 3a      Contrast Nephropathy - Serum Cr 2.8 <- 3.06 <- 2.81 <- 2.44 - Third spacing likely due infiltration - Give 25 mg IV Albumin   - Avoid overhydration given age and recent rib fractures - Been getting fluids but made minimal urine output ,will bladder scan her  - Encourage PO intake  - Hold nephrotoxic agents - Avoid morphine ; use hydromorphone  or fentanyl  if analgesia required. - Strict I/O monitoring - Trend renal function and electrolytes - Will continue to follow    NAGMA - Due to acute kidney injury - I expect improving as kidney function gets better - No need to bicarbonate at this time  - Will continue to monitor  Hypovolemia Hyponatremia   - Mild elevation at 131?mg/dL with no apparent symptoms; expected to improve with adequate oral intake. - Will continue to monitor closely.  Dispo:The acute kidney injury  is likely pre-renal, caused by decreased perfusion due to trauma and reduced oral intake, with a multifactorial contribution from volume depletion, IV contrast exposure while on ARB therapy, and prolonged relative hypotension (systolic blood pressures in the 80s and 90s since 04/20/24) leading to ischemic acute tubular necrosis . Supportive care should be continued, nephrotoxic agents avoided, and renal function closely monitored.  Drue Grow 04/23/2024, 9:34 AM

## 2024-04-24 LAB — RENAL FUNCTION PANEL
Albumin: 2.5 g/dL — ABNORMAL LOW (ref 3.5–5.0)
Anion gap: 12 (ref 5–15)
BUN: 76 mg/dL — ABNORMAL HIGH (ref 8–23)
CO2: 18 mmol/L — ABNORMAL LOW (ref 22–32)
Calcium: 8.5 mg/dL — ABNORMAL LOW (ref 8.9–10.3)
Chloride: 102 mmol/L (ref 98–111)
Creatinine, Ser: 2.35 mg/dL — ABNORMAL HIGH (ref 0.44–1.00)
GFR, Estimated: 19 mL/min — ABNORMAL LOW (ref >60)
Glucose, Bld: 129 mg/dL — ABNORMAL HIGH (ref 70–99)
Phosphorus: 4.3 mg/dL (ref 2.5–4.6)
Potassium: 4.7 mmol/L (ref 3.5–5.1)
Sodium: 132 mmol/L — ABNORMAL LOW (ref 135–145)

## 2024-04-24 MED ORDER — CHLORHEXIDINE GLUCONATE CLOTH 2 % EX PADS
6.0000 | MEDICATED_PAD | Freq: Every day | CUTANEOUS | Status: DC
  Administered 2024-04-24 – 2024-04-28 (×5): 6 via TOPICAL

## 2024-04-24 MED ORDER — ENSURE PLUS HIGH PROTEIN PO LIQD: 237.0000 mL | Freq: Three times a day (TID) | ORAL | Status: DC

## 2024-04-24 MED ORDER — BOOST / RESOURCE BREEZE PO LIQD CUSTOM
1.0000 | Freq: Three times a day (TID) | ORAL | Status: DC
  Administered 2024-04-24 – 2024-04-29 (×10): 1 via ORAL
  Filled 2024-04-24 (×2): qty 1

## 2024-04-24 NOTE — TOC Progression Note (Addendum)
 Transition of Care [facility]) - Progression Note    Patient Details  Name: Kathryn [name] MRN: [id number] Date of Birth: 1928-06-04  Transition of Care [facility]) CM/SW Contact  Ashrith Sagan M, RN Phone Number: 04/24/2024, 10:18 AM  Clinical Narrative:    Noted patient's FTT with little to no PO intake, pain, and inability to urinate.  Await Palliative Medicine Team consult to discuss goals of care with patient and family.  Will follow.   Addendum: 1500pm Noted order for residential Hospice facility; attending MD discussed patient's current state and disposition options with family earlier today.  Palliative Medicine Team consult remains pending.  Family would like to have time to discuss options among themselves before making any decision.   Inpatient Care Manager will continue to follow.    Expected Discharge Plan: Skilled Nursing Facility Barriers to Discharge: Continued Medical Work up               Expected Discharge Plan and Services       Living arrangements for the past 2 months: Single Family Home                                       Social Drivers of Health (SDOH) Interventions SDOH Screenings   Food Insecurity: No Food Insecurity (04/18/2024)  Housing: Low Risk  (04/18/2024)  Transportation Needs: No Transportation Needs (04/18/2024)  Utilities: Not At Risk (04/18/2024)  Social Connections: Patient Declined (04/18/2024)  Tobacco Use: Low Risk  (04/18/2024)    Readmission Risk Interventions    01/09/2024   12:20 PM  Readmission Risk Prevention Plan  Transportation Screening Complete  PCP or Specialist Appt within 5-7 Days Complete  Home Care Screening Complete  Medication Review (RN CM) Complete   Mliss MICAEL Fass, RN, BSN  Trauma/Neuro ICU Case Manager [phone]

## 2024-04-24 NOTE — Progress Notes (Addendum)
 Progress Note     Subjective: 500cc of UOP yesterday.  Ate 1 cookie yesterday only.  Hasn't been out of bed at all during this hospitalization.  Complains pain is too much to move.  She tells me her goal is to get out of bed.  RN states patient has been I/O 3 x since yesterday due to inability to void  Objective: Vital signs in last 24 hours: Temp:  [96.9 F (36.1 C)-98.6 F (37 C)] 98.6 F (37 C) (10/17 0744) Pulse Rate:  [73-84] 75 (10/17 0744) Resp:  [14-19] 17 (10/17 0744) BP: (109-133)/(66-89) 133/89 (10/17 0744) SpO2:  [94 %-96 %] 94 % (10/17 0744) Last BM Date : 04/23/24  Intake/Output from previous day: 10/16 0701 - 10/17 0700 In: -  Out: 500 [Urine:500] Intake/Output this shift: No intake/output data recorded.  PE: General: pleasant, WD, elderly female who is laying in bed in NAD Heart: regular, rate, and rhythm. paced Lungs: CTAB, no wheezes, rhonchi, or rales noted.  Respiratory effort nonlabored Abd: soft, NT, ND, no masses, hernias, or organomegaly Neuro: Cranial nerves 2-12 grossly intact, sensation is normal throughout Ext: BUEs with significant edema Psych: A&Ox3 with an appropriate affect.    Lab Results:  Recent Labs    04/22/24 0314 04/23/24 0536  WBC 11.0* 13.2*  HGB 8.5* 9.2*  HCT 26.6* 29.2*  PLT 243 267   BMET Recent Labs    04/23/24 0536 04/24/24 0524  NA 131* 132*  K 4.5 4.7  CL 102 102  CO2 16* 18*  GLUCOSE 121* 129*  BUN 70* 76*  CREATININE 2.80* 2.35*  CALCIUM 8.4* 8.5*   PT/INR No results for input(s): LABPROT, INR in the last 72 hours. CMP     Component Value Date/Time   NA 132 (L) 04/24/2024 0524   NA 139 10/14/2023 1539   K 4.7 04/24/2024 0524   CL 102 04/24/2024 0524   CO2 18 (L) 04/24/2024 0524   GLUCOSE 129 (H) 04/24/2024 0524   BUN 76 (H) 04/24/2024 0524   BUN 28 10/14/2023 1539   CREATININE 2.35 (H) 04/24/2024 0524   CREATININE 1.00 (H) 09/27/2015 1426   CALCIUM 8.5 (L) 04/24/2024 0524   PROT 5.4  (L) 04/22/2024 1929   ALBUMIN 2.5 (L) 04/24/2024 0524   AST 27 04/22/2024 1929   ALT 18 04/22/2024 1929   ALKPHOS 89 04/22/2024 1929   BILITOT 1.8 (H) 04/22/2024 1929   GFRNONAA 19 (L) 04/24/2024 0524   GFRAA >60 02/14/2015 1200   Lipase     Component Value Date/Time   LIPASE 34 04/18/2024 1410       Studies/Results: US  RENAL Result Date: 04/22/2024 CLINICAL DATA:  Acute kidney injury. EXAM: RENAL / URINARY TRACT ULTRASOUND COMPLETE COMPARISON:  CT scan 04/18/2024 FINDINGS: Right Kidney: Renal measurements: 8.1 x 5.3 x 4.0 cm = volume: 89 mL. Echogenic focus in the central kidney although no calcified stone seen on CT scan 4 days ago. No hydronephrosis. Cortical scarring evident. Left Kidney: Renal measurements: 7.7 x 4.8 x 4.0 cm = volume: 76 mL. No hydronephrosis. Renal parenchymal echogenicity within normal limits. Areas of cortical thinning compatible scarring. Bladder: Appears normal for degree of bladder distention. Other: None. IMPRESSION: 1. No hydronephrosis. 2. Bilateral renal cortical scarring. Electronically Signed   By: Camellia Candle M.D.   On: 04/22/2024 12:34    Anti-infectives: Anti-infectives (From admission, onward)    None        Assessment/Plan  GLF L 6-11 rib fxs with tract  PTX - no PTX noted on CXR from 10/12, multimodal pain control, IS AKI on CKD IIIa - No nephrotoxic agents, Cr 2.35 today, appreciate renal's assistance.  IVFs on hold due to edema.  Given albumin yesterday and Cr seems to be falling.  Made 500cc of urine yesterday Urinary retention - 3 I/O caths since yesterday.  Will place a foley for urine monitoring and for retention purposes. FTT/malnutrition - add Breeze FEN: reg diet (not eating)/Breeze VTE: heparin  ID: no current abx  Dispo: 4NP, discussion with son yesterday regarding disposition and concern for failure to thrive  LOS: 6 days   I reviewed last 24 h vitals and pain scores, last 48 h intake and output, last 24 h labs and  trends, and last 24 h imaging results.   Burnard FORBES Banter, [facility] 04/24/2024, 9:45 AM Please see Amion for pager number during day hours 7:00am-4:30pm

## 2024-04-24 NOTE — Progress Notes (Signed)
 Physical Therapy Treatment Patient Details Name: Kathryn [name] MRN: [id number] DOB: Jul 10, 1927 Today's Date: 04/24/2024   History of Present Illness 89 y.o. female admitted 10/11 after sustaining a GLF resulting in L 6-11 rib fx w/ displacement and small PTX.  PMH paroxysmal A-fib not on anticoagulation, pacemaker, hypertension, chronic kidney disease stage IIIa    PT Comments  Pt initially difficult to motivate to participate, but willing to mobilize OOB with encouragement and rationale for mobility. Pt requiring most physical assist for bed mobility given L flank pain, once EOB pt appeared to have better pain control. Pt tolerating stand and pivot OOB to recliner this date, pt overall requiring +2 for balance and support. Pt progressing slowly, continue current post-acute recommendations.      If plan is discharge home, recommend the following: Assistance with cooking/housework;Assist for transportation;Help with stairs or ramp for entrance;A lot of help with walking and/or transfers;A lot of help with bathing/dressing/bathroom   Can travel by private vehicle        Equipment Recommendations  None recommended by PT    Recommendations for Other Services       Precautions / Restrictions Precautions Precautions: Fall Recall of Precautions/Restrictions: Intact Precaution/Restrictions Comments: Monitor O2. Restrictions Weight Bearing Restrictions Per Provider Order: No     Mobility  Bed Mobility Overal bed mobility: Needs Assistance Bed Mobility: Rolling, Sidelying to Sit Rolling: Total assist, +2 for safety/equipment, +2 for physical assistance Sidelying to sit: Total assist, +2 for physical assistance, +2 for safety/equipment       General bed mobility comments: total A for pain management, helicopter method for to/from EOB    Transfers Overall transfer level: Needs assistance Equipment used: 2 person hand held assist Transfers: Sit to/from Stand, Bed to  chair/wheelchair/BSC Sit to Stand: Min assist, +2 physical assistance, +2 safety/equipment   Step pivot transfers: Min assist, +2 physical assistance, +2 safety/equipment       General transfer comment: assist for rise and steady, shuffling steps to reach recliner. pt vocalizing in pain during mobility    Ambulation/Gait                   Stairs             Wheelchair Mobility     Tilt Bed    Modified Rankin (Stroke Patients Only)       Balance Overall balance assessment: Needs assistance Sitting-balance support: No upper extremity supported, Feet supported Sitting balance-Leahy Scale: Fair Sitting balance - Comments: supervision A for sitting EOB for ~5 minutes   Standing balance support: Bilateral upper extremity supported, During functional activity Standing balance-Leahy Scale: Poor Standing balance comment: reliant on bilat UE support                            Communication Communication Communication: No apparent difficulties  Cognition Arousal: Alert Behavior During Therapy: WFL for tasks assessed/performed   PT - Cognitive impairments: No apparent impairments                         Following commands: Impaired Following commands impaired: Follows one step commands with increased time    Cueing Cueing Techniques: Verbal cues  Exercises      General Comments General comments (skin integrity, edema, etc.): vss      Pertinent Vitals/Pain Pain Assessment Pain Assessment: Faces Faces Pain Scale: Hurts even more Pain Location: Lt flank Pain Descriptors /  Indicators: Aching, Grimacing, Guarding, Sharp Pain Intervention(s): Repositioned, Limited activity within patient's tolerance, Monitored during session    Home Living                          Prior Function            PT Goals (current goals can now be found in the care plan section) Acute Rehab PT Goals Patient Stated Goal: Get well, reduce  pain PT Goal Formulation: With patient Time For Goal Achievement: 05/03/24 Potential to Achieve Goals: Good Progress towards PT goals: Progressing toward goals    Frequency    Min 2X/week      PT Plan      Co-evaluation PT/OT/SLP Co-Evaluation/Treatment: Yes Reason for Co-Treatment: To address functional/ADL transfers;Complexity of the patient's impairments (multi-system involvement);For patient/therapist safety PT goals addressed during session: Mobility/safety with mobility;Balance OT goals addressed during session: ADL's and self-care      AM-PAC PT 6 Clicks Mobility   Outcome Measure  Help needed turning from your back to your side while in a flat bed without using bedrails?: A Lot Help needed moving from lying on your back to sitting on the side of a flat bed without using bedrails?: A Lot Help needed moving to and from a bed to a chair (including a wheelchair)?: A Lot Help needed standing up from a chair using your arms ([name].g., wheelchair or bedside chair)?: Total Help needed to walk in hospital room?: Total Help needed climbing 3-5 steps with a railing? : Total 6 Click Score: 9    End of Session   Activity Tolerance: Patient tolerated treatment well;Patient limited by pain Patient left: with call bell/phone within reach;in chair;with chair alarm set Nurse Communication: Mobility status PT Visit Diagnosis: Muscle weakness (generalized) (M62.81);History of falling (Z91.81);Pain     Time: 8893-8863 PT Time Calculation (min) (ACUTE ONLY): 30 min  Charges:    $Therapeutic Activity: 8-22 mins PT General Charges $$ ACUTE PT VISIT: 1 Visit                     Kathryn [name], PT DPT Acute Rehabilitation Services Secure Chat Preferred  Office [phone]    Kathryn [name] Kathryn [name] 04/24/2024, 2:13 PM

## 2024-04-24 NOTE — Progress Notes (Signed)
Occupational Therapy Treatment Patient Details Name: Kathryn [name] MRN: [id number] DOB: Nov 27, 1927 Today's Date: 04/24/2024   History of present illness 89 y.o. female admitted 10/11 after sustaining a GLF resulting in L 6-11 rib fx w/ displacement and small PTX.  PMH paroxysmal A-fib not on anticoagulation, pacemaker, hypertension, chronic kidney disease stage IIIa   OT comments  Pt is making fair progress towards their acute OT goals. Pt was seen with PT to address activity tolerance and OOB transfer. Pt initially declined but with encouragement and education she was willing. Total A +2 needed for bed mobility for pain management and she tolerated unsupported sitting with superivsion A. Min A +2 needed for STS and pivotal stepping. Pt left in recliner to encourage upright positioning and eating OOB. OT to continue to follow acutely to facilitate progress towards established goals. Pt will continue to benefit from skilled inpatient follow up therapy, <3 hours/day.       If plan is discharge home, recommend the following:  A lot of help with walking and/or transfers;A lot of help with bathing/dressing/bathroom;Assistance with cooking/housework;Assist for transportation;Help with stairs or ramp for entrance   Equipment Recommendations  None recommended by OT       Precautions / Restrictions Precautions Precautions: Fall Recall of Precautions/Restrictions: Intact Precaution/Restrictions Comments: Monitor O2. Restrictions Weight Bearing Restrictions Per Provider Order: No       Mobility Bed Mobility Overal bed mobility: Needs Assistance Bed Mobility: Rolling, Sidelying to Sit Rolling: Total assist, +2 for safety/equipment, +2 for physical assistance Sidelying to sit: Total assist, +2 for physical assistance, +2 for safety/equipment       General bed mobility comments: total A for pain management    Transfers Overall transfer level: Needs assistance Equipment used: 2  person hand held assist Transfers: Sit to/from Stand, Bed to chair/wheelchair/BSC Sit to Stand: Min assist, +2 physical assistance, +2 safety/equipment     Step pivot transfers: Min assist, +2 physical assistance, +2 safety/equipment           Balance Overall balance assessment: Needs assistance Sitting-balance support: No upper extremity supported, Feet supported Sitting balance-Leahy Scale: Fair Sitting balance - Comments: supervision A for sitting EOB for ~5 minutes   Standing balance support: Bilateral upper extremity supported, During functional activity Standing balance-Leahy Scale: Poor                             ADL either performed or assessed with clinical judgement   ADL Overall ADL's : Needs assistance/impaired Eating/Feeding: Set up;Sitting   Grooming: Set up;Sitting                   Toilet Transfer: Minimal assistance;+2 for physical assistance;+2 for safety/equipment Toilet Transfer Details (indicate cue type and reason): pivotal stepping - simulated         Functional mobility during ADLs: Minimal assistance;+2 for physical assistance;+2 for safety/equipment (total A fro bed mobility) General ADL Comments: pt tolerated sitting EOB, STS and SP transfer with +2 asssit    Extremity/Trunk Assessment Upper Extremity Assessment Upper Extremity Assessment: Generalized weakness RUE Deficits / Details: ROM WFL, weak with slowed coordination LUE Deficits / Details: unable to move due to exacerbation of flank pain   Lower Extremity Assessment Lower Extremity Assessment: Defer to PT evaluation        Vision   Vision Assessment?: No apparent visual deficits   Perception Perception Perception: Not tested   Praxis Praxis Praxis: Not tested  Communication Communication Communication: No apparent difficulties   Cognition Arousal: Alert Behavior During Therapy: WFL for tasks assessed/performed Cognition: No apparent impairments              OT - Cognition Comments: continues to be self limiting due to pain, education provided.                 Following commands: Impaired Following commands impaired: Follows one step commands with increased time      Cueing   Cueing Techniques: Verbal cues        General Comments VSS on RA    Pertinent Vitals/ Pain       Pain Assessment Pain Assessment: Faces Faces Pain Scale: Hurts whole lot   Frequency  Min 2X/week        Progress Toward Goals  OT Goals(current goals can now be found in the care plan section)  Progress towards OT goals: Progressing toward goals  Acute Rehab OT Goals Patient Stated Goal: less pain OT Goal Formulation: With patient Time For Goal Achievement: 04/09/2024 Potential to Achieve Goals: Good ADL Goals Pt Will Perform Upper Body Dressing: with modified independence Pt Will Perform Lower Body Dressing: with min assist;sit to/from stand Pt Will Transfer to Toilet: with min assist;ambulating Additional ADL Goal #1: Pt will complete bed mobility with mod I as a precursor to ADLs  Plan      Co-evaluation    PT/OT/SLP Co-Evaluation/Treatment: Yes Reason for Co-Treatment: Complexity of the patient's impairments (multi-system involvement);For patient/therapist safety;To address functional/ADL transfers   OT goals addressed during session: ADL's and self-care      AM-PAC OT 6 Clicks Daily Activity     Outcome Measure   Help from another person eating meals?: A Little Help from another person taking care of personal grooming?: A Little Help from another person toileting, which includes using toliet, bedpan, or urinal?: Total Help from another person bathing (including washing, rinsing, drying)?: Total Help from another person to put on and taking off regular upper body clothing?: A Lot Help from another person to put on and taking off regular lower body clothing?: Total 6 Click Score: 11    End of Session    OT Visit  Diagnosis: Unsteadiness on feet (R26.81);Other abnormalities of gait and mobility (R26.89);Muscle weakness (generalized) (M62.81);Pain   Activity Tolerance Patient limited by pain   Patient Left in chair;with call bell/phone within reach;with chair alarm set   Nurse Communication Mobility status        Time: 8893-8863 OT Time Calculation (min): 30 min  Charges: OT General Charges $OT Visit: 1 Visit OT Treatments $Therapeutic Activity: 8-22 mins  Lucie Kendall, OTR/L Acute Rehabilitation Services Office [phone] Secure Chat Communication Preferred   Lucie JONETTA Kendall 04/24/2024, 11:43 AM

## 2024-04-24 NOTE — Progress Notes (Signed)
 Patient ID: Kathryn [name], female   DOB: 06-24-1928, 89 y.o.   MRN: [id number] S: was having urinary retention and foley catheter placed this morning.  Still with very poor po intake. O:BP 133/89 (BP Location: Left Arm)   Pulse 75   Temp 98.6 F (37 C) (Axillary)   Resp 17   Ht 5' 4 (1.626 m)   Wt 52.2 kg   SpO2 94%   BMI 19.74 kg/m   Intake/Output Summary (Last 24 hours) at 04/24/2024 1054 Last data filed at 04/24/2024 0355 Gross per 24 hour  Intake --  Output 500 ml  Net -500 ml   Intake/Output: I/O last 3 completed shifts: In: -  Out: 800 [Urine:800]  Intake/Output this shift:  No intake/output data recorded. Weight change:  Gen: frail, elderly female in mild distress lying in bed CVS: RRR Resp:CTa Abd: +BS, soft, NT/ND Ext: RUE edema   Recent Labs  Lab 04/18/24 1410 04/18/24 1415 04/18/24 1923 04/19/24 0433 04/21/24 0947 04/22/24 0314 04/22/24 1929 04/23/24 0536 04/24/24 0524  NA 134* 136  --  135 133* 131* 132* 131* 132*  K 4.4 4.4  --  4.0 4.5 4.5 4.5 4.5 4.7  CL 100 100  --  101 101 101 100 102 102  CO2 23  --   --  25 17* 17* 18* 16* 18*  GLUCOSE 134* 136*  --  105* 106* 110* 137* 121* 129*  BUN 26* 30*  --  27* 49* 59* 66* 70* 76*  CREATININE 1.20* 1.30* 1.17* 1.13* 2.44* 2.81* 3.06* 2.80* 2.35*  ALBUMIN 3.2*  --   --   --   --   --  2.4* 2.4* 2.5*  CALCIUM 9.2  --   --  8.7* 8.4* 8.1* 8.1* 8.4* 8.5*  PHOS  --   --   --   --   --   --   --  5.0* 4.3  AST 27  --   --   --   --   --  27  --   --   ALT 15  --   --   --   --   --  18  --   --    Liver Function Tests: Recent Labs  Lab 04/18/24 1410 04/22/24 1929 04/23/24 0536 04/24/24 0524  AST 27 27  --   --   ALT 15 18  --   --   ALKPHOS 69 89  --   --   BILITOT 1.3* 1.8*  --   --   PROT 6.6 5.4*  --   --   ALBUMIN 3.2* 2.4* 2.4* 2.5*   Recent Labs  Lab 04/18/24 1410  LIPASE 34   No results for input(s): AMMONIA in the last 168 hours. CBC: Recent Labs  Lab 04/18/24 1410  04/18/24 1415 04/18/24 1923 04/19/24 0433 04/21/24 0947 04/22/24 0314 04/23/24 0536  WBC 15.5*  --  13.4* 9.6 15.1* 11.0* 13.2*  NEUTROABS 13.6*  --   --   --   --   --   --   HGB 10.1*   < > 9.1* 8.1* 9.2* 8.5* 9.2*  HCT 31.5*   < > 27.8* 24.6* 28.7* 26.6* 29.2*  MCV 93.5  --  92.7 91.1 94.1 92.4 95.1  PLT 257  --  212 185 233 243 267   < > = values in this interval not displayed.   Cardiac Enzymes: Recent Labs  Lab 04/22/24 0314  CKTOTAL 70  CBG: No results for input(s): GLUCAP in the last 168 hours.  Iron  Studies: No results for input(s): IRON , TIBC, TRANSFERRIN, FERRITIN in the last 72 hours. Studies/Results: US  RENAL Result Date: 04/22/2024 CLINICAL DATA:  Acute kidney injury. EXAM: RENAL / URINARY TRACT ULTRASOUND COMPLETE COMPARISON:  CT scan 04/18/2024 FINDINGS: Right Kidney: Renal measurements: 8.1 x 5.3 x 4.0 cm = volume: 89 mL. Echogenic focus in the central kidney although no calcified stone seen on CT scan 4 days ago. No hydronephrosis. Cortical scarring evident. Left Kidney: Renal measurements: 7.7 x 4.8 x 4.0 cm = volume: 76 mL. No hydronephrosis. Renal parenchymal echogenicity within normal limits. Areas of cortical thinning compatible scarring. Bladder: Appears normal for degree of bladder distention. Other: None. IMPRESSION: 1. No hydronephrosis. 2. Bilateral renal cortical scarring. Electronically Signed   By: Camellia Candle M.D.   On: 04/22/2024 12:34    acetaminophen   1,000 mg Oral Q6H   Chlorhexidine  Gluconate Cloth  6 each Topical Daily   docusate sodium   100 mg Oral BID   feeding supplement  1 Container Oral TID BM   heparin  injection (subcutaneous)  5,000 Units Subcutaneous Q8H   methocarbamol (ROBAXIN) injection  500 mg Intravenous Daily    BMET    Component Value Date/Time   NA 132 (L) 04/24/2024 0524   NA 139 10/14/2023 1539   K 4.7 04/24/2024 0524   CL 102 04/24/2024 0524   CO2 18 (L) 04/24/2024 0524   GLUCOSE 129 (H) 04/24/2024  0524   BUN 76 (H) 04/24/2024 0524   BUN 28 10/14/2023 1539   CREATININE 2.35 (H) 04/24/2024 0524   CREATININE 1.00 (H) 09/27/2015 1426   CALCIUM 8.5 (L) 04/24/2024 0524   GFRNONAA 19 (L) 04/24/2024 0524   GFRAA >60 02/14/2015 1200   CBC    Component Value Date/Time   WBC 13.2 (H) 04/23/2024 0536   RBC 3.07 (L) 04/23/2024 0536   HGB 9.2 (L) 04/23/2024 0536   HCT 29.2 (L) 04/23/2024 0536   PLT 267 04/23/2024 0536   MCV 95.1 04/23/2024 0536   MCH 30.0 04/23/2024 0536   MCHC 31.5 04/23/2024 0536   RDW 14.3 04/23/2024 0536   LYMPHSABS 0.6 (L) 04/18/2024 1410   MONOABS 1.2 (H) 04/18/2024 1410   EOSABS 0.0 04/18/2024 1410   BASOSABS 0.0 04/18/2024 1410     Assessment/Plan  Patient with acute kidney injury, with serum creatinine rising from 1.20 to 2.81 mg/dL, BUN 59 <- 26,  likely multifactorial but primarily contrast induced nephropathy with pre-renal due to decreased renal perfusion in the setting of trauma and poor oral intake. Renal ultrasound showed no hydronephrosis but findings consistent with bilateral renal cortical scarring. Urinalysis showed no evidence of infection or casts. Will treat as pre-renal AKI at this time and continue to monitor renal function closely.CK is normal. She was hypotensive for two days, suspecting ischemic ATN in the setting of hypotension .   AKI on CKD 3a      Contrast Nephropathy - Serum Cr  2.35 <-2.8 <- 3.06 <- 2.81 <- 2.44 - UOP improving but needed a foley catheter due to urinary retention - Gave 25 mg IV Albumin   - Avoid overhydration given age and recent rib fractures - Encourage PO intake  - Hold nephrotoxic agents - Avoid morphine ; use hydromorphone  or fentanyl  if analgesia required. - Strict I/O monitoring - Trend renal function and electrolytes - Not a dialysis candidate given advanced age, multiple co-morbidities, and poor nutritional and functional status.  Agree with  ongoing discussions regarding palliative care -Nothing further  to add.  Will sign off.  Please call with questions or concerns.     2. NAGMA - Due to acute kidney injury - I expect improving as kidney function gets better - No need to bicarbonate at this time     3. Hypovolemia Hyponatremia   - Mild elevation at 131?mg/dL with no apparent symptoms; expected to improve with adequate oral intake. - Will continue to monitor closely.   Dispo:  Ongoing discussions regarding goals of care per trauma team. Supportive care should be continued, nephrotoxic agents avoided, and renal function closely monitored.  Fairy RONAL Sellar, MD [facility]

## 2024-04-25 DIAGNOSIS — Z7189 Other specified counseling: Secondary | ICD-10-CM | POA: Diagnosis not present

## 2024-04-25 DIAGNOSIS — Z515 Encounter for palliative care: Secondary | ICD-10-CM | POA: Diagnosis not present

## 2024-04-25 DIAGNOSIS — S2242XA Multiple fractures of ribs, left side, initial encounter for closed fracture: Secondary | ICD-10-CM

## 2024-04-25 DIAGNOSIS — T1490XA Injury, unspecified, initial encounter: Secondary | ICD-10-CM

## 2024-04-25 LAB — MULTIPLE MYELOMA PANEL, SERUM
Albumin SerPl Elph-Mcnc: 2.5 g/dL — ABNORMAL LOW (ref 2.9–4.4)
Albumin/Glob SerPl: 1 (ref 0.7–1.7)
Alpha 1: 0.4 g/dL (ref 0.0–0.4)
Alpha2 Glob SerPl Elph-Mcnc: 0.7 g/dL (ref 0.4–1.0)
B-Globulin SerPl Elph-Mcnc: 0.7 g/dL (ref 0.7–1.3)
Gamma Glob SerPl Elph-Mcnc: 0.9 g/dL (ref 0.4–1.8)
Globulin, Total: 2.6 g/dL (ref 2.2–3.9)
IgA: 132 mg/dL (ref 64–422)
IgG (Immunoglobin G), Serum: 1066 mg/dL (ref 586–1602)
IgM (Immunoglobulin M), Srm: 72 mg/dL (ref 26–217)
Total Protein ELP: 5.1 g/dL — ABNORMAL LOW (ref 6.0–8.5)

## 2024-04-25 LAB — RENAL FUNCTION PANEL
Albumin: 2.3 g/dL — ABNORMAL LOW (ref 3.5–5.0)
Anion gap: 10 (ref 5–15)
BUN: 65 mg/dL — ABNORMAL HIGH (ref 8–23)
CO2: 20 mmol/L — ABNORMAL LOW (ref 22–32)
Calcium: 8.4 mg/dL — ABNORMAL LOW (ref 8.9–10.3)
Chloride: 105 mmol/L (ref 98–111)
Creatinine, Ser: 1.61 mg/dL — ABNORMAL HIGH (ref 0.44–1.00)
GFR, Estimated: 30 mL/min — ABNORMAL LOW (ref >60)
Glucose, Bld: 100 mg/dL — ABNORMAL HIGH (ref 70–99)
Phosphorus: 2.5 mg/dL (ref 2.5–4.6)
Potassium: 4.3 mmol/L (ref 3.5–5.1)
Sodium: 135 mmol/L (ref 135–145)

## 2024-04-25 LAB — CBC
HCT: 27.9 % — ABNORMAL LOW (ref 36.0–46.0)
Hemoglobin: 9.1 g/dL — ABNORMAL LOW (ref 12.0–15.0)
MCH: 29.8 pg (ref 26.0–34.0)
MCHC: 32.6 g/dL (ref 30.0–36.0)
MCV: 91.5 fL (ref 80.0–100.0)
Platelets: 291 K/uL (ref 150–400)
RBC: 3.05 MIL/uL — ABNORMAL LOW (ref 3.87–5.11)
RDW: 14.4 % (ref 11.5–15.5)
WBC: 6.2 K/uL (ref 4.0–10.5)
nRBC: 1 % — ABNORMAL HIGH (ref 0.0–0.2)

## 2024-04-25 MED ORDER — TRAMADOL HCL 50 MG PO TABS
25.0000 mg | ORAL_TABLET | ORAL | Status: DC | PRN
  Administered 2024-04-25 – 2024-04-29 (×6): 50 mg via ORAL
  Filled 2024-04-25 (×6): qty 1

## 2024-04-25 MED ORDER — METHOCARBAMOL 750 MG PO TABS
750.0000 mg | ORAL_TABLET | Freq: Four times a day (QID) | ORAL | Status: DC
  Administered 2024-04-25 – 2024-04-29 (×16): 750 mg via ORAL
  Filled 2024-04-25 (×17): qty 1

## 2024-04-25 MED ORDER — CARMEX CLASSIC LIP BALM EX OINT
TOPICAL_OINTMENT | CUTANEOUS | Status: DC | PRN
  Filled 2024-04-25: qty 10

## 2024-04-25 MED ORDER — SODIUM CHLORIDE 0.9 % IV BOLUS
1000.0000 mL | Freq: Once | INTRAVENOUS | Status: AC
  Administered 2024-04-25: 1000 mL via INTRAVENOUS

## 2024-04-25 MED ORDER — HYDROMORPHONE HCL 1 MG/ML IJ SOLN
0.5000 mg | INTRAMUSCULAR | Status: DC | PRN
  Administered 2024-04-26: 0.5 mg via INTRAVENOUS
  Filled 2024-04-25 (×2): qty 0.5

## 2024-04-25 NOTE — Consult Note (Signed)
 Palliative Care Consult Note                                  Date: 04/25/2024   Patient Name: Kathryn [name]  DOB: [birth year]  MRN: [id number]  Age / Sex: 89 y.o., female  PCP: Shayne Anes, MD Referring Physician: Md, Trauma, MD  Reason for Consultation: Establishing goals of care  HPI/Patient Profile: 89 y.o. female  with past medical history of paroxysmal A-fib not on anticoagulation, pacemaker, hypertension and chronic kidney disease stage IIIa admitted on 04/18/2024 after a fall resulting in L 6-11 rib fx w/ displacement and small PTX.   Patient now with acute kidney injury on CKD IIIa.  Patient faces treatment option decisions, advanced directive decisions, and anticipatory care needs.  Past Medical History:  Diagnosis Date   Arthritis    HANDS   Bradycardia    CKD stage 3a, GFR 45-59 ml/min (HCC) 12/24/2023   DVT (deep venous thrombosis) (HCC) 2010   Dysrhythmia    HX OF PAROXYSMAL ATRIAL FIB- TAKES ASPIRIN  FOR BLOOD THINNER   GERD (gastroesophageal reflux disease)    Hypertension    Peripheral vascular disease    Blood clot behind Right knee   Presence of permanent cardiac pacemaker    COMPLETE HEART BLOCK--DR. CROITORU     Subjective:   I have reviewed medical records including EPIC notes, labs and imaging, assessed the patient and then met with her to discuss diagnosis prognosis, GOC, EOL wishes, disposition and options.  I introduced Palliative Medicine as specialized medical care for people living with serious illness. It focuses on providing relief from symptoms and stress of a serious illness. The goal is to improve quality of life for both the patient and the family.  Today's Discussion: Patient sitting up in bed in NAD. No family at bedside. Discussed patient's chronic conditions. She shared she has been very healthy in general over the years and comes from a family with good longevity. We discussed her  current hospitalization including her rib fractures and acute kidney injury on chronic kidney disease. She is surprised to hear about her chronic kidney disease. She shared her rib fractures are very painful- primary team providing symptom management. We discussed that while we are treating the treatable we need to balance pain management and hemodynamic stability.   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 Since retiring she has enjoyed playing piano and the organ. She has three children, one grandchild, and great grandchildren. Prior to this hospitalization the patient was living independently. She was completing ADLs and IADLs independently. She has always been clumsy but since she has gotten older she has begun falling more. After a fall on her porch last year she had a decrease in function and health.   The patient does not have advanced directives. She understands if she could not make decisions her three sons would be her proxy decision makers. We discussed the importance of continued conversation with family and the medical providers regarding overall plan of care and treatment options, ensuring decisions are within the context of the patient's values and GOCs. We briefly discussed the differences between an aggressive medical path and a comfort focused path. The patient admits this is a lot to take in. Discussed code status. Recommended consideration of DNR status, understanding evidenced-based poor outcomes in similar hospitalized patients, as the cause of the arrest  is likely associated with chronic/terminal disease rather than a reversible acute cardio-pulmonary event. She would like to consider this further and discuss with her children. Patient remains full code and full scope of care.   Spoke with patient's son Darina. He will coordinate with his brothers to find a time for a goals of care meeting. This will likely be Monday 04/27/24. Requested  he call PMT once he knows the availability of his siblings.  Questions and concerns were addressed. Hard Choices booklet left for review. The family was encouraged to call with questions or concerns. PMT will continue to support holistically.  Review of Systems  Constitutional:  Positive for activity change and appetite change.  Musculoskeletal:        Rib pain    Objective:   Primary Diagnoses: Present on Admission:  Trauma   Physical Exam Vitals reviewed.  Constitutional:      General: She is in acute distress.  HENT:     Head: Normocephalic and atraumatic.  Cardiovascular:     Rate and Rhythm: Normal rate.  Pulmonary:     Effort: Tachypnea present.  Skin:    General: Skin is warm and dry.  Neurological:     Mental Status: She is alert and oriented to person, place, and time.  Psychiatric:        Mood and Affect: Mood normal.        Behavior: Behavior normal.     Vital Signs:  BP (!) 159/89 (BP Location: Left Arm)   Pulse 69   Temp 97.7 F (36.5 C) (Oral)   Resp (!) 22   Ht 5' 4 (1.626 m)   Wt 52.2 kg   SpO2 95%   BMI 19.74 kg/m     Advanced Care Planning:   Existing Vynca/ACP Documentation: None  Primary Decision Maker: PATIENT  Code Status/Advance Care Planning: Full code   Assessment & Plan:   SUMMARY OF RECOMMENDATIONS   Continue full code and full scope of treatment Encouraged patient to consider goals of care moving forward Son Darina to coordinate with brothers for time for GOC discussion-- possibly Monday 10/20. He will call PMT with details. Continued PMT support   Discussed with: bedside RN  Time Total: 75 minutes    Thank you for allowing us  to participate in the care of Kathryn [name] PMT will continue to support holistically.   Signed by: Stephane Palin, NP Palliative Medicine Team  Team Phone # [phone] (Nights/Weekends)  04/25/2024, 3:00 PM

## 2024-04-25 NOTE — Progress Notes (Signed)
 Trauma/Critical Care Follow Up Note  Subjective:    Overnight Issues:   Objective:  Vital signs for last 24 hours: Temp:  [97.6 F (36.4 C)-98.3 F (36.8 C)] 98.3 F (36.8 C) (10/18 0751) Pulse Rate:  [70-77] 70 (10/18 0751) Resp:  [18-22] 21 (10/18 0751) BP: (129-148)/(71-84) 148/74 (10/18 0751) SpO2:  [93 %-96 %] 96 % (10/18 0751)  Hemodynamic parameters for last 24 hours:    Intake/Output from previous day: 10/17 0701 - 10/18 0700 In: -  Out: 675 [Urine:675]  Intake/Output this shift: No intake/output data recorded.  Vent settings for last 24 hours:    Physical Exam:  Gen: comfortable, no distress Neuro: follows commands, alert, communicative HEENT: PERRL Neck: supple CV: RRR Pulm: unlabored breathing on RA Abd: soft, NT  , +BM GU: urine clear and yellow, +spontaneous voids Extr: wwp, no edema  Results for orders placed or performed during the hospital encounter of 04/18/24 (from the past 24 hours)  Renal function panel     Status: Abnormal   Collection Time: 04/25/24  5:34 AM  Result Value Ref Range   Sodium 135 135 - 145 mmol/L   Potassium 4.3 3.5 - 5.1 mmol/L   Chloride 105 98 - 111 mmol/L   CO2 20 (L) 22 - 32 mmol/L   Glucose, Bld 100 (H) 70 - 99 mg/dL   BUN 65 (H) 8 - 23 mg/dL   Creatinine, Ser 8.38 (H) 0.44 - 1.00 mg/dL   Calcium 8.4 (L) 8.9 - 10.3 mg/dL   Phosphorus 2.5 2.5 - 4.6 mg/dL   Albumin 2.3 (L) 3.5 - 5.0 g/dL   GFR, Estimated 30 (L) >60 mL/min   Anion gap 10 5 - 15  CBC     Status: Abnormal   Collection Time: 04/25/24  5:34 AM  Result Value Ref Range   WBC 6.2 4.0 - 10.5 K/uL   RBC 3.05 (L) 3.87 - 5.11 MIL/uL   Hemoglobin 9.1 (L) 12.0 - 15.0 g/dL   HCT 72.0 (L) 63.9 - 53.9 %   MCV 91.5 80.0 - 100.0 fL   MCH 29.8 26.0 - 34.0 pg   MCHC 32.6 30.0 - 36.0 g/dL   RDW 85.5 88.4 - 84.4 %   Platelets 291 150 - 400 K/uL   nRBC 1.0 (H) 0.0 - 0.2 %    Assessment & Plan: The plan of care was discussed with the bedside nurse for the  day, who is in agreement with this plan and no additional concerns were raised.   Present on Admission:  Trauma    LOS: 7 days   Additional comments:I reviewed the patient's new clinical lab test results.   and I reviewed the patients new imaging test results.    GLF L 6-11 rib fxs with tract PTX - no PTX noted on CXR from 10/12, multimodal pain control, IS AKI on CKD IIIa - No nephrotoxic agents, Cr 2.35 today, appreciate renal's assistance.  IVFs on hold due to edema.  Given albumin yesterday and Cr seems to be falling.  Made 500cc of urine yesterday Urinary retention - 3 I/O caths since yesterday.  Will place a foley for urine monitoring and for retention purposes. FTT/malnutrition - add Breeze, patient refusing FEN: reg diet (not eating)/Breeze VTE: heparin  ID: no current abx   Dispo: 4NP, discussion with two sons yesterday regarding disposition and concern for failure to thrive. When asking patient about hospice, she says I don't know. Will need to connect with sons again. PMT c/s  MYRTIS Dreama GEANNIE Paola, MD Trauma & General Surgery Please use [email or web address] to contact on call provider  04/25/2024  *Care during the described time interval was provided by me. I have reviewed this patient's available data, including medical history, events of note, physical examination and test results as part of my evaluation.

## 2024-04-26 DIAGNOSIS — T1490XA Injury, unspecified, initial encounter: Secondary | ICD-10-CM | POA: Diagnosis not present

## 2024-04-26 DIAGNOSIS — Z515 Encounter for palliative care: Secondary | ICD-10-CM | POA: Diagnosis not present

## 2024-04-26 DIAGNOSIS — Z7189 Other specified counseling: Secondary | ICD-10-CM | POA: Diagnosis not present

## 2024-04-26 DIAGNOSIS — S2242XA Multiple fractures of ribs, left side, initial encounter for closed fracture: Secondary | ICD-10-CM | POA: Diagnosis not present

## 2024-04-26 LAB — RENAL FUNCTION PANEL
Albumin: 2.3 g/dL — ABNORMAL LOW (ref 3.5–5.0)
Anion gap: 10 (ref 5–15)
BUN: 48 mg/dL — ABNORMAL HIGH (ref 8–23)
CO2: 19 mmol/L — ABNORMAL LOW (ref 22–32)
Calcium: 8.5 mg/dL — ABNORMAL LOW (ref 8.9–10.3)
Chloride: 107 mmol/L (ref 98–111)
Creatinine, Ser: 1.06 mg/dL — ABNORMAL HIGH (ref 0.44–1.00)
GFR, Estimated: 49 mL/min — ABNORMAL LOW (ref >60)
Glucose, Bld: 124 mg/dL — ABNORMAL HIGH (ref 70–99)
Phosphorus: 2 mg/dL — ABNORMAL LOW (ref 2.5–4.6)
Potassium: 4 mmol/L (ref 3.5–5.1)
Sodium: 136 mmol/L (ref 135–145)

## 2024-04-26 MED ORDER — ALUM & MAG HYDROXIDE-SIMETH 200-200-20 MG/5ML PO SUSP
30.0000 mL | Freq: Four times a day (QID) | ORAL | Status: DC | PRN
  Administered 2024-04-26: 30 mL via ORAL
  Filled 2024-04-26: qty 30

## 2024-04-26 NOTE — Progress Notes (Signed)
 [facility] [phone]  [facility] Liaison Note  Referral received for family interest in [facility]. [facility] Liaison to follow up with patient and family on tomorrow to explain services and hospice philosophy.    Thank you for the opportunity to participate in this patient's care  Nat Babe, BSN, RN Hospice Nurse Liaison [phone]

## 2024-04-26 NOTE — Progress Notes (Signed)
   Trauma/Critical Care Follow Up Note  Subjective:    Overnight Issues:   Objective:  Vital signs for last 24 hours: Temp:  [97.6 F (36.4 C)-98.1 F (36.7 C)] 98.1 F (36.7 C) (10/19 0734) Pulse Rate:  [69-73] 70 (10/19 0734) Resp:  [14-30] 14 (10/19 0734) BP: (137-168)/(80-90) 137/81 (10/19 0734) SpO2:  [93 %-95 %] 94 % (10/19 0734)  Hemodynamic parameters for last 24 hours:    Intake/Output from previous day: 10/18 0701 - 10/19 0700 In: -  Out: 575 [Urine:575]  Intake/Output this shift: Total I/O In: -  Out: 350 [Urine:350]  Vent settings for last 24 hours:    Physical Exam:  Gen: comfortable, no distress Neuro: follows commands, alert, communicative HEENT: PERRL Neck: supple CV: RRR Pulm: unlabored breathing on RA Abd: soft, NT  , no recent BM GU: urine clear and yellow, +Foley Extr: wwp, no edema  Results for orders placed or performed during the hospital encounter of 04/18/24 (from the past 24 hours)  Renal function panel     Status: Abnormal   Collection Time: 04/26/24  5:40 AM  Result Value Ref Range   Sodium 136 135 - 145 mmol/L   Potassium 4.0 3.5 - 5.1 mmol/L   Chloride 107 98 - 111 mmol/L   CO2 19 (L) 22 - 32 mmol/L   Glucose, Bld 124 (H) 70 - 99 mg/dL   BUN 48 (H) 8 - 23 mg/dL   Creatinine, Ser 8.93 (H) 0.44 - 1.00 mg/dL   Calcium 8.5 (L) 8.9 - 10.3 mg/dL   Phosphorus 2.0 (L) 2.5 - 4.6 mg/dL   Albumin 2.3 (L) 3.5 - 5.0 g/dL   GFR, Estimated 49 (L) >60 mL/min   Anion gap 10 5 - 15    Assessment & Plan: The plan of care was discussed with the bedside nurse for the day, who is in agreement with this plan and no additional concerns were raised.   Present on Admission:  Trauma    LOS: 8 days   Additional comments:I reviewed the patient's new clinical lab test results.   and I reviewed the patients new imaging test results.    GLF . L 6-11 rib fxs with tract PTX - no PTX noted on CXR from 10/12, multimodal pain control, IS AKI on CKD  IIIa - No nephrotoxic agents, Cr 2.35 today, appreciate renal's assistance.  IVFs on hold due to edema.  Given albumin yesterday and Cr seems to be falling.  Made 500cc of urine yesterday Urinary retention - 3 I/O caths since yesterday.  Will place a foley for urine monitoring and for retention purposes. FTT/malnutrition - add Breeze, patient refusing FEN: reg diet (not eating)/Breeze VTE: heparin  ID: no current abx   Dispo: 4NP, discussion with children by me and by MPT regarding hospice. This morning patient reports that she is interested in hospice, will re-engage TOC.   Dreama GEANNIE Hanger, MD Trauma & General Surgery Please use [email or web address] to contact on call provider  04/26/2024  *Care during the described time interval was provided by me. I have reviewed this patient's available data, including medical history, events of note, physical examination and test results as part of my evaluation.

## 2024-04-26 NOTE — Progress Notes (Addendum)
 Daily Progress Note   [name] Name: Kathryn [name]       Date: 04/26/2024 DOB: 28-May-1928  Age: 89 y.o. MRN#: [id number] Attending Physician: Rolla Seltzer, MD Primary Care Physician: Shayne Anes, MD Admit Date: 04/18/2024  Reason for Consultation/Follow-up: Establishing goals of care   Length of Stay: 8  Current Medications: Scheduled Meds:   acetaminophen   1,000 mg Oral Q6H   Chlorhexidine  Gluconate Cloth  6 each Topical Daily   docusate sodium   100 mg Oral BID   feeding supplement  1 Container Oral TID BM   heparin  injection (subcutaneous)  5,000 Units Subcutaneous Q8H   methocarbamol  750 mg Oral QID    Continuous Infusions:   PRN Meds: alum & mag hydroxide-simeth, hydrALAZINE, HYDROmorphone  (DILAUDID ) injection, lip balm, metoprolol  tartrate, ondansetron  **OR** ondansetron  (ZOFRAN ) IV, polyethylene glycol, traMADol   Physical Exam Vitals reviewed.  Constitutional:      General: Kathryn [name] is sleeping. Kathryn [name] is not in acute distress. HENT:     Head: Normocephalic and atraumatic.  Cardiovascular:     Rate and Rhythm: Normal rate.  Pulmonary:     Effort: Pulmonary effort is normal.  Skin:    General: Skin is warm and dry.  Neurological:     Mental Status: Kathryn [name] is oriented to person, place, and time and easily aroused.  Psychiatric:        Behavior: Behavior normal.             Vital Signs: BP (!) 141/84 (BP Location: Left Arm)   Pulse 71   Temp 98.5 F (36.9 C) (Axillary)   Resp 19   Ht 5' 4 (1.626 m)   Wt 52.2 kg   SpO2 95%   BMI 19.74 kg/m  SpO2: SpO2: 95 % O2 Device: O2 Device: Room Air O2 Flow Rate: O2 Flow Rate (L/min): 3 L/min      [name] Active Problem List   Diagnosis Date Noted   Trauma 04/18/2024   Laceration of muscle(s) and tendon(s) of  peroneal muscle group at lower leg level, right leg, subsequent encounter 01/08/2024   Acute on chronic diastolic HF (heart failure) (HCC) 12/25/2023   Acute on chronic diastolic CHF (congestive heart failure) (HCC) 12/24/2023   Laceration of right leg excluding thigh, initial encounter 12/24/2023   CKD stage 3a, GFR 45-59 ml/min (HCC) 12/24/2023  Ruptured silicone breast implant 08/28/2017   Normal coronary arteries 07/04/2017   Pacemaker battery depletion 09/14/2015   S/P colostomy takedown 05/21/2014   Diverticulitis of colon (without mention of hemorrhage)(562.11) 01/27/2014   Colostomy in place [facility]) 01/27/2014   Paroxysmal atrial fibrillation (HCC) 01/19/2014   Complete heart block (HCC) 01/19/2014   Pacemaker - dual-chamber Medtronic 01/19/2014   Preop cardiovascular exam 01/19/2014   HTN (hypertension) 01/19/2014    Palliative Care Assessment & Plan   [name] Profile:  89 y.o. female  with past medical history of paroxysmal A-fib not on anticoagulation, pacemaker, hypertension and chronic kidney disease stage IIIa admitted on 04/18/2024 after a fall resulting in L 6-11 rib fx w/ displacement and small PTX.    [name] now with acute kidney injury on CKD IIIa.   [name] faces treatment option decisions, advanced directive decisions, and anticipatory care needs.  Today's Discussion: Reviewed chart and received update from attending and nursing. [name] told attending Kathryn [name] is interested in pursuing hospice services. TOC order placed.  [name] sleeping in NAD. Kathryn [name] Kathryn [name] remembers talking to me yesterday about options moving forward including hospice. Kathryn [name] does not remember talking to attending about hospice this morning. Kathryn [name] continues to have poor oral intake. Kathryn [name] tells me Kathryn [name] has not eaten anything today. Kathryn [name] son Wolm and daughter in law Rock are at bedside. Rock [name] Kathryn [name] a poor appetite for several months.  We discussed hospice as an option moving  forward. Provided education about hospice services. Kathryn goal of hospice is Kathryn preservation of dignity and quality at Kathryn end phases of life. Under hospice care, Kathryn focus changes from curative to symptom relief. I explained Kathryn three settings where hospice services can be provided including Kathryn home, at a living facility (such as LTC SNF, Assisted Living, etc), and a hospice facility. I explained that acceptance to hospice in any specific location is Kathryn final decision of Kathryn hospice medical director and bed availability, if applicable. They [name] Kathryn family would not be able to provide care at home. They believe [facility] would be Kathryn best option due to proximity. Notified [facility] liaison about [name]/family interest.  We [name] planned to have a GOC meeting Monday but Kathryn [name]'s son Darina has not coordinated this yet. We agree that PMT will speak to [name] in Kathryn morning about Kathryn [name] goals of care and then we can try to coordinate a call with Kathryn [name]'s sons to discuss next steps.  Emotional support and therapeutic listening provided. Discussed Kathryn importance of continued conversation with family and Kathryn medical providers regarding overall plan of care and treatment options ensuring decisions are within Kathryn context of Kathryn [name]'s values and GOCs.   Recommendations/Plan: Full code and full scope No GOC meeting Monday-- Will try to have a call with sons [name] interested in hospice- [facility] liaison and TOC notified Continued PMT support   Code Status:    Code Status Orders  (From admission, onward)           Start     Ordered   04/18/24 1630  Full code  Continuous       Question:  By:  Answer:  Other   04/18/24 1631         Extensive chart review has been completed prior to seeing Kathryn [name] including labs, vital signs, imaging, progress/consult notes, orders, medications, and available advance directive documents.  Care plan was discussed with Dr. Paola, bedside RN, [facility]  liaison  Time  spent: 65 minutes  Thank you for allowing Kathryn Palliative Medicine Team to assist in Kathryn care of this [name].   Stephane CHRISTELLA Palin, NP  Please contact Palliative Medicine Team phone at [phone] for questions and concerns.

## 2024-04-27 ENCOUNTER — Telehealth: Payer: Self-pay | Admitting: Plastic Surgery

## 2024-04-27 DIAGNOSIS — Z515 Encounter for palliative care: Secondary | ICD-10-CM | POA: Diagnosis not present

## 2024-04-27 DIAGNOSIS — T1490XA Injury, unspecified, initial encounter: Secondary | ICD-10-CM | POA: Diagnosis not present

## 2024-04-27 DIAGNOSIS — S2242XA Multiple fractures of ribs, left side, initial encounter for closed fracture: Secondary | ICD-10-CM | POA: Diagnosis not present

## 2024-04-27 DIAGNOSIS — Z7189 Other specified counseling: Secondary | ICD-10-CM | POA: Diagnosis not present

## 2024-04-27 LAB — RENAL FUNCTION PANEL
Albumin: 2.4 g/dL — ABNORMAL LOW (ref 3.5–5.0)
Anion gap: 7 (ref 5–15)
BUN: 36 mg/dL — ABNORMAL HIGH (ref 8–23)
CO2: 20 mmol/L — ABNORMAL LOW (ref 22–32)
Calcium: 8.6 mg/dL — ABNORMAL LOW (ref 8.9–10.3)
Chloride: 109 mmol/L (ref 98–111)
Creatinine, Ser: 0.9 mg/dL (ref 0.44–1.00)
GFR, Estimated: 60 mL/min — ABNORMAL LOW (ref >60)
Glucose, Bld: 106 mg/dL — ABNORMAL HIGH (ref 70–99)
Phosphorus: 1.8 mg/dL — ABNORMAL LOW (ref 2.5–4.6)
Potassium: 4.1 mmol/L (ref 3.5–5.1)
Sodium: 136 mmol/L (ref 135–145)

## 2024-04-27 MED ORDER — CARVEDILOL 12.5 MG PO TABS
12.5000 mg | ORAL_TABLET | Freq: Two times a day (BID) | ORAL | Status: DC
  Administered 2024-04-27 – 2024-04-29 (×5): 12.5 mg via ORAL
  Filled 2024-04-27 (×5): qty 1

## 2024-04-27 MED ORDER — TAMSULOSIN HCL 0.4 MG PO CAPS
0.4000 mg | ORAL_CAPSULE | Freq: Every day | ORAL | Status: DC
  Administered 2024-04-27 – 2024-04-29 (×3): 0.4 mg via ORAL
  Filled 2024-04-27 (×3): qty 1

## 2024-04-27 NOTE — Progress Notes (Signed)
 Daily Progress Note   Patient Name: Kathryn [name]       Date: 04/27/2024 DOB: 05/13/1928  Age: 89 y.o. MRN#: [id number] Attending Physician: Rolla Seltzer, MD Primary Care Physician: Shayne Anes, MD Admit Date: 04/18/2024  Reason for Consultation/Follow-up: Establishing goals of care   Length of Stay: 9  Current Medications: Scheduled Meds:   acetaminophen   1,000 mg Oral Q6H   carvedilol   12.5 mg Oral BID   Chlorhexidine  Gluconate Cloth  6 each Topical Daily   docusate sodium   100 mg Oral BID   feeding supplement  1 Container Oral TID BM   heparin  injection (subcutaneous)  5,000 Units Subcutaneous Q8H   methocarbamol  750 mg Oral QID   tamsulosin  0.4 mg Oral Daily    Continuous Infusions:   PRN Meds: alum & mag hydroxide-simeth, hydrALAZINE, HYDROmorphone  (DILAUDID ) injection, lip balm, metoprolol  tartrate, ondansetron  **OR** ondansetron  (ZOFRAN ) IV, polyethylene glycol, traMADol   Physical Exam Vitals reviewed.  Constitutional:      General: She is sleeping. She is not in acute distress. HENT:     Head: Normocephalic and atraumatic.  Cardiovascular:     Rate and Rhythm: Normal rate.  Pulmonary:     Effort: Tachypnea present.  Skin:    General: Skin is warm and dry.  Neurological:     Mental Status: She is oriented to person, place, and time and easily aroused.  Psychiatric:        Behavior: Behavior normal.             Vital Signs: BP (!) 185/86   Pulse 70   Temp 97.8 F (36.6 C) (Oral)   Resp (!) 27   Ht 5' 4 (1.626 m)   Wt 52.2 kg   SpO2 95%   BMI 19.74 kg/m  SpO2: SpO2: 95 % O2 Device: O2 Device: Room Air O2 Flow Rate: O2 Flow Rate (L/min): 3 L/min      Patient Active Problem List   Diagnosis Date Noted   Trauma 04/18/2024   Laceration  of muscle(s) and tendon(s) of peroneal muscle group at lower leg level, right leg, subsequent encounter 01/08/2024   Acute on chronic diastolic HF (heart failure) (HCC) 12/25/2023   Acute on chronic diastolic CHF (congestive heart failure) (HCC) 12/24/2023   Laceration of right leg excluding thigh, initial encounter  12/24/2023   CKD stage 3a, GFR 45-59 ml/min (HCC) 12/24/2023   Ruptured silicone breast implant 08/28/2017   Normal coronary arteries 07/04/2017   Pacemaker battery depletion 09/14/2015   S/P colostomy takedown 05/21/2014   Diverticulitis of colon (without mention of hemorrhage)(562.11) 01/27/2014   Colostomy in place [facility]) 01/27/2014   Paroxysmal atrial fibrillation (HCC) 01/19/2014   Complete heart block (HCC) 01/19/2014   Pacemaker - dual-chamber Medtronic 01/19/2014   Preop cardiovascular exam 01/19/2014   HTN (hypertension) 01/19/2014    Palliative Care Assessment & Plan   Patient Profile:  89 y.o. female  with past medical history of paroxysmal A-fib not on anticoagulation, pacemaker, hypertension and chronic kidney disease stage IIIa admitted on 04/18/2024 after a fall resulting in L 6-11 rib fx w/ displacement and small PTX.    Patient now with acute kidney injury on CKD IIIa.   Patient faces treatment option decisions, advanced directive decisions, and anticipatory care needs.  Today's Discussion: Reviewed chart. Patient sitting up in bed. She reports today has been a very bad day for her. She had some trouble breathing and elevated blood pressure this morning. She also had pain this morning and requested prn medications. She did not eat any breakfast and has no appetite. No family at bedside.  We reviewed our discussions from the last few days. We discussed options moving forward including continuing to treat the treatable or transitioning to comfort measures/ hospice. Patient tells me she cannot think about all that at this moment. She does remember talking about  hospice and is agreeable to hospice liaison coming by to discuss hospice options. I tell her that they may be able to clarify any questions she may have which may make decision making easier. She shared that her boys will support her no matter what decision she makes.  Left voicemail for son Kathryn [name] on the phone number listed. His wife returned the call. I shared the patient is struggling to make a definitive decision. I believe if the boys participated she may feel better making this decision. Daughter in law states the patient's son Kathryn [name] is very sick right now. If patient continues to struggle with decision making we could try again to set up a meeting with family- could be done over phone if necessary. She would like PMT to call siblings separately-- I shared I think a meeting would be more valuable so everybody can be on the same page. Will wait to see what hospice liaison discussions are with patient/family before setting up meeting.  Received update from [facility] liaison. Patient was sleeping and did not want to talk today. Reached out to son Kathryn [name] who asked for a callback later today.  Emotional support and therapeutic listening provided. Discussed the importance of continued conversation with family and the medical providers regarding overall plan of care and treatment options ensuring decisions are within the context of the patient's values and GOCs.   Recommendations/Plan: Full code and full scope May need to set up meeting if patient continues to struggle with decision making Patient interested in hospice- ACC liaison and TOC notified Continued PMT support   Code Status:    Code Status Orders  (From admission, onward)           Start     Ordered   04/18/24 1630  Full code  Continuous       Question:  By:  Answer:  Other   04/18/24 1631         Extensive chart review  has been completed prior to seeing the patient including labs, vital signs, imaging, progress/consult notes,  orders, medications, and available advance directive documents.  Care plan was discussed with bedside RN, TOC, ACC liaison  Time spent: 50 minutes  Thank you for allowing the Palliative Medicine Team to assist in the care of this patient.   Stephane CHRISTELLA Palin, NP  Please contact Palliative Medicine Team phone at [phone] for questions and concerns.

## 2024-04-27 NOTE — Progress Notes (Signed)
 89 yo female known to plastic surgery service as we had been managing her RLE wound with xeroform dressing changes. Patient had fall per EMR review and has been hospitalized.  Checked in on patient, discussed continue with recommendations per primary, will follow up with patient as an outpatient. Remain available as needed.

## 2024-04-27 NOTE — Plan of Care (Signed)

## 2024-04-27 NOTE — Telephone Encounter (Signed)
 Went to check in on patient,was not aware she was hospitalized. Discussed with patient, continue with Unna boots per primary team. Will document separately.

## 2024-04-27 NOTE — Plan of Care (Signed)

## 2024-04-27 NOTE — Progress Notes (Signed)
 Patient family called x2 requesting patient have potato soup. Nurse expressed to daughter  that nurse would call dietary and request if they have potato soup. Nurse also suggested that family bring in foods that patient likes to encourage eating. Daughter expressed that patient can not tolerate thick foods. Nurse called for soup for patient. Awaiting delivery. Kathryn LITTIE Morones, RN

## 2024-04-27 NOTE — TOC Progression Note (Signed)
 Transition of Care [facility]) - Progression Note    Patient Details  Name: [name] MRN: [id number] Date of Birth: 06/15/1928  [facility] CM/SW Contact  Yameli Delamater E Tevyn Codd, LCSW Phone Number: 04/27/2024, 11:53 AM  Clinical Narrative:    ICM continues to follow. [facility] to reach out to patient and family today regarding their services.   Expected Discharge Plan: Skilled Nursing Facility Barriers to Discharge: Continued Medical Work up               Expected Discharge Plan and Services       Living arrangements for the past 2 months: Single Family Home                                       Social Drivers of Health (SDOH) Interventions SDOH Screenings   Food Insecurity: No Food Insecurity (04/18/2024)  Housing: Low Risk  (04/18/2024)  Transportation Needs: No Transportation Needs (04/18/2024)  Utilities: Not At Risk (04/18/2024)  Social Connections: Patient Declined (04/18/2024)  Tobacco Use: Low Risk  (04/18/2024)    Readmission Risk Interventions    01/09/2024   12:20 PM  Readmission Risk Prevention Plan  Transportation Screening Complete  PCP or Specialist Appt within 5-7 Days Complete  Home Care Screening Complete  Medication Review (RN CM) Complete

## 2024-04-27 NOTE — Telephone Encounter (Signed)
 Patients Daughter called because Kathryn [name] had asked her to call us , she says that her bandages have not been changed since she has been in the hospital, she says that she has been there since 10-11, please reach out and advise

## 2024-04-27 NOTE — Progress Notes (Signed)
 Patient changed and cleaned due to incontinence bowel movement. New gown placed, CHG and foley care done per protocol. Patients sacral foam changed signed and dated. Unna boot removed from left lower leg, wound cleaned and wound care given per orders. Ortho tech contacted and made aware that patient is ready for unna boot to be placed. Awaiting ortho tech. [name], RN

## 2024-04-27 NOTE — Progress Notes (Signed)
 Orthopedic Tech Progress Note Patient Details:  Laquita Harlan O'Daniel January 08, 1928 [id number]  Ortho Devices Type of Ortho Device: Ace wrap, Unna boot Ortho Device/Splint Location: RLE Ortho Device/Splint Interventions: Ordered, Application, Adjustment   Post Interventions Patient Tolerated: Well Instructions Provided: Care of device  Delanna LITTIE Pac 04/27/2024, 7:02 PM

## 2024-04-27 NOTE — Progress Notes (Signed)
 [id number] [facility] Liaison Note  Followed up on request from Transitions of Care Manager for family interest in hospice services.  Spoke with son Darina via telephone to confirm interest and explain services. Darina does not think she can go home with hospice services due to her living alone and she does not currently meet criteria for inpatient hospice.  Liaisons will continue to follow and assess.  Please do not hesitate to call with any questions.  Thank you Inocente Jacobs BSN RN [facility] Liaison [phone]

## 2024-04-27 NOTE — Progress Notes (Signed)
 Progress Note     Subjective: Pt asking for ice water this AM, this was brought to her. I asked her if she is still interested in hospice and she was unable to give me a yes or no, just feels uncertain.   Objective: Vital signs in last 24 hours: Temp:  [97.6 F (36.4 C)-98.5 F (36.9 C)] 97.8 F (36.6 C) (10/20 0811) Pulse Rate:  [69-71] 70 (10/20 0811) Resp:  [18-27] 27 (10/20 0811) BP: (130-185)/(78-86) 185/86 (10/20 0819) SpO2:  [95 %-96 %] 95 % (10/20 0811) Last BM Date : 04/26/24  Intake/Output from previous day: 10/19 0701 - 10/20 0700 In: 150 [P.O.:150] Out: 700 [Urine:700] Intake/Output this shift: No intake/output data recorded.  PE: General: pleasant, WD, elderly female who is laying in bed in NAD Heart: regular, rate, and rhythm. paced Lungs: CTAB, no wheezes, rhonchi, or rales noted.  Respiratory effort nonlabored Abd: soft, NT, ND, no masses, hernias, or organomegaly Neuro: Cranial nerves 2-12 grossly intact, sensation is normal throughout Ext: BUEs with significant edema Psych: A&Ox3 with an appropriate affect.     Lab Results:  Recent Labs    04/25/24 0534  WBC 6.2  HGB 9.1*  HCT 27.9*  PLT 291   BMET Recent Labs    04/26/24 0540 04/27/24 0519  NA 136 136  K 4.0 4.1  CL 107 109  CO2 19* 20*  GLUCOSE 124* 106*  BUN 48* 36*  CREATININE 1.06* 0.90  CALCIUM 8.5* 8.6*   PT/INR No results for input(s): LABPROT, INR in the last 72 hours. CMP     Component Value Date/Time   NA 136 04/27/2024 0519   NA 139 10/14/2023 1539   K 4.1 04/27/2024 0519   CL 109 04/27/2024 0519   CO2 20 (L) 04/27/2024 0519   GLUCOSE 106 (H) 04/27/2024 0519   BUN 36 (H) 04/27/2024 0519   BUN 28 10/14/2023 1539   CREATININE 0.90 04/27/2024 0519   CREATININE 1.00 (H) 09/27/2015 1426   CALCIUM 8.6 (L) 04/27/2024 0519   PROT 5.4 (L) 04/22/2024 1929   ALBUMIN 2.4 (L) 04/27/2024 0519   AST 27 04/22/2024 1929   ALT 18 04/22/2024 1929   ALKPHOS 89 04/22/2024  1929   BILITOT 1.8 (H) 04/22/2024 1929   GFRNONAA 60 (L) 04/27/2024 0519   GFRAA >60 02/14/2015 1200   Lipase     Component Value Date/Time   LIPASE 34 04/18/2024 1410       Studies/Results: No results found.  Anti-infectives: Anti-infectives (From admission, onward)    None        Assessment/Plan GLF  L 6-11 rib fxs with tract PTX - no PTX noted on CXR from 10/12, multimodal pain control, IS AKI on CKD IIIa - No nephrotoxic agents, Cr 0.9, UOP 700 cc for last 24 hrs Urinary retention - foley present, add flomax FTT/malnutrition - add Breeze, patient refusing HTN - restarted home coreg   FEN: reg diet (not eating)/Breeze VTE: heparin  ID: no current abx   Dispo: 4NP, GOC discussions ongoing, appreciate palliative medicine assistance. Patient remains appropriate for hospice if that is what she desires   LOS: 9 days   I reviewed Consultant palliative notes, last 24 h vitals and pain scores, last 48 h intake and output, last 24 h labs and trends, and last 24 h imaging results.  This care required moderate level of medical decision making.    Burnard JONELLE Louder, [facility] 04/27/2024, 11:35 AM Please see Amion for  pager number during day hours 7:00am-4:30pm

## 2024-04-27 NOTE — Progress Notes (Signed)
 Patient vital signs taken. B/P at 185/86 and HR at 70. Patient given Hydralazine per MD prn orders. Patinet also expresses 8/10 neck pain, patient received scheduled dose of robaxin and prn dose of tramadol . Patient adjusted in bed with breakfast in front of her. [name], RN

## 2024-04-28 DIAGNOSIS — Z515 Encounter for palliative care: Secondary | ICD-10-CM | POA: Diagnosis not present

## 2024-04-28 DIAGNOSIS — S2242XA Multiple fractures of ribs, left side, initial encounter for closed fracture: Secondary | ICD-10-CM | POA: Diagnosis not present

## 2024-04-28 DIAGNOSIS — Z7189 Other specified counseling: Secondary | ICD-10-CM | POA: Diagnosis not present

## 2024-04-28 DIAGNOSIS — R627 Adult failure to thrive: Secondary | ICD-10-CM | POA: Diagnosis not present

## 2024-04-28 LAB — RENAL FUNCTION PANEL
Albumin: 2.5 g/dL — ABNORMAL LOW (ref 3.5–5.0)
Anion gap: 8 (ref 5–15)
BUN: 31 mg/dL — ABNORMAL HIGH (ref 8–23)
CO2: 20 mmol/L — ABNORMAL LOW (ref 22–32)
Calcium: 8.7 mg/dL — ABNORMAL LOW (ref 8.9–10.3)
Chloride: 109 mmol/L (ref 98–111)
Creatinine, Ser: 0.84 mg/dL (ref 0.44–1.00)
GFR, Estimated: >60 mL/min (ref >60)
Glucose, Bld: 100 mg/dL — ABNORMAL HIGH (ref 70–99)
Phosphorus: 2 mg/dL — ABNORMAL LOW (ref 2.5–4.6)
Potassium: 4 mmol/L (ref 3.5–5.1)
Sodium: 137 mmol/L (ref 135–145)

## 2024-04-28 MED ORDER — HYDROMORPHONE HCL 1 MG/ML IJ SOLN
1.0000 mg | INTRAMUSCULAR | Status: DC | PRN
  Administered 2024-04-28: 1 mg via INTRAVENOUS
  Filled 2024-04-28: qty 1

## 2024-04-28 NOTE — Progress Notes (Signed)
Occupational Therapy Treatment Patient Details Name: Kathryn [name] MRN: [id number] DOB: [birth year] Today's Date: 04/28/2024   History of present illness 89 y.o. female admitted 10/11 after sustaining a GLF resulting in L 6-11 rib fx w/ displacement and small PTX.  PMH paroxysmal A-fib not on anticoagulation, pacemaker, hypertension, chronic kidney disease stage IIIa   OT comments  Pt is making limited progress towards their acute OT goals. Overall pt was more participatory this date and did not report significant pain. She needed min A +2 to stand and take a few steps with the RW. She fatigued very quickly and needed mod A +2 for safety. Pt reported dizziness throughout, BP and VSS on RA. Pt left sitting uprigh tin the chair to encourage self-feeding. OT to continue to follow acutely to facilitate progress towards established goals. Pt will continue to benefit from skilled inpatient follow up therapy, <3 hours/day.       If plan is discharge home, recommend the following:  A lot of help with walking and/or transfers;A lot of help with bathing/dressing/bathroom;Assistance with cooking/housework;Assist for transportation;Help with stairs or ramp for entrance   Equipment Recommendations  None recommended by OT       Precautions / Restrictions Precautions Precautions: Fall Recall of Precautions/Restrictions: Intact Precaution/Restrictions Comments: Monitor O2. Restrictions Weight Bearing Restrictions Per Provider Order: No       Mobility Bed Mobility Overal bed mobility: Needs Assistance Bed Mobility: Supine to Sit     Supine to sit: Mod assist          Transfers Overall transfer level: Needs assistance Equipment used: 2 person hand held assist Transfers: Sit to/from Stand, Bed to chair/wheelchair/BSC Sit to Stand: Min assist, +2 physical assistance, +2 safety/equipment           General transfer comment: min A to stand and take a few steps, pt fatigued veyr  quickly and needed mod A +2 to sit     Balance Overall balance assessment: Needs assistance Sitting-balance support: No upper extremity supported, Feet supported Sitting balance-Leahy Scale: Fair Sitting balance - Comments: supervision A for sitting EOB for ~5 minutes   Standing balance support: Bilateral upper extremity supported, During functional activity Standing balance-Leahy Scale: Poor Standing balance comment: reliant on bilat UE support                           ADL either performed or assessed with clinical judgement   ADL Overall ADL's : Needs assistance/impaired Eating/Feeding: Set up;Sitting Eating/Feeding Details (indicate cue type and reason): encouraged OOB uprigh eating                     Toilet Transfer: Moderate assistance;+2 for physical assistance;+2 for safety/equipment;Ambulation;Rolling walker (2 wheels) Toilet Transfer Details (indicate cue type and reason): simulated         Functional mobility during ADLs: Moderate assistance;+2 for physical assistance;+2 for safety/equipment General ADL Comments: pt was initally min A to stand and take steps, she fatiuged very quickly and needed mod A +2    Extremity/Trunk Assessment Upper Extremity Assessment Upper Extremity Assessment: RUE deficits/detail;LUE deficits/detail RUE Deficits / Details: ROM WFL, weak with slowed coordination LUE Deficits / Details: functional, slowed movements, very weak   Lower Extremity Assessment Lower Extremity Assessment: Defer to PT evaluation        Vision   Vision Assessment?: No apparent visual deficits   Perception Perception Perception: Not tested   Praxis Praxis Praxis: Not  tested   Communication Communication Communication: No apparent difficulties   Cognition Arousal: Alert Behavior During Therapy: WFL for tasks assessed/performed Cognition: No apparent impairments             OT - Cognition Comments: continues to be flat however  she participated more this date                 Following commands: Impaired Following commands impaired: Follows one step commands with increased time      Cueing   Cueing Techniques: Verbal cues  Exercises      Shoulder Instructions       General Comments VSS, pt complained of dizziness    Pertinent Vitals/ Pain       Pain Assessment Pain Assessment: Faces Faces Pain Scale: Hurts little more Pain Location: Lt flank Pain Descriptors / Indicators: Aching, Grimacing, Guarding, Sharp Pain Intervention(s): Limited activity within patient's tolerance, Monitored during session   Frequency  Min 2X/week        Progress Toward Goals  OT Goals(current goals can now be found in the care plan section)  Progress towards OT goals: Progressing toward goals  Acute Rehab OT Goals Patient Stated Goal: to get better OT Goal Formulation: With patient Time For Goal Achievement: 04/15/2024 Potential to Achieve Goals: Good ADL Goals Pt Will Perform Upper Body Dressing: with modified independence Pt Will Perform Lower Body Dressing: with min assist;sit to/from stand Pt Will Transfer to Toilet: with min assist;ambulating Additional ADL Goal #1: Pt will complete bed mobility with mod I as a precursor to ADLs  Plan      Co-evaluation    PT/OT/SLP Co-Evaluation/Treatment: Yes Reason for Co-Treatment: To address functional/ADL transfers;Complexity of the patient's impairments (multi-system involvement);For patient/therapist safety   OT goals addressed during session: ADL's and self-care      AM-PAC OT 6 Clicks Daily Activity     Outcome Measure   Help from another person eating meals?: A Little Help from another person taking care of personal grooming?: A Little Help from another person toileting, which includes using toliet, bedpan, or urinal?: A Lot Help from another person bathing (including washing, rinsing, drying)?: Total Help from another person to put on and  taking off regular upper body clothing?: A Lot Help from another person to put on and taking off regular lower body clothing?: Total 6 Click Score: 12    End of Session Equipment Utilized During Treatment: Gait belt;Rolling walker (2 wheels)  OT Visit Diagnosis: Unsteadiness on feet (R26.81);Other abnormalities of gait and mobility (R26.89);Muscle weakness (generalized) (M62.81);Pain   Activity Tolerance Patient limited by pain   Patient Left in chair;with call bell/phone within reach;with chair alarm set   Nurse Communication Mobility status        Time: 1324-1350 OT Time Calculation (min): 26 min  Charges: OT General Charges $OT Visit: 1 Visit OT Treatments $Self Care/Home Management : 8-22 mins  Lucie Kendall, OTR/L Acute Rehabilitation Services Office [phone] Secure Chat Communication Preferred   Lucie JONETTA Kendall 04/28/2024, 3:32 PM

## 2024-04-28 NOTE — Plan of Care (Signed)
   Problem: Health Behavior/Discharge Planning: Goal: Ability to manage health-related needs will improve Outcome: Progressing   Problem: Clinical Measurements: Goal: Ability to maintain clinical measurements within normal limits will improve Outcome: Progressing   Problem: Activity: Goal: Risk for activity intolerance will decrease Outcome: Progressing   Problem: Nutrition: Goal: Adequate nutrition will be maintained Outcome: Progressing

## 2024-04-28 NOTE — Progress Notes (Signed)
 Patient ID: Kathryn [name], female   DOB: [birth year], 89 y.o.   MRN: [id number]      Subjective: Reports she has not felt like eating yet today ROS negative except as listed above. Objective: Vital signs in last 24 hours: Temp:  [97.5 F (36.4 C)-98.5 F (36.9 C)] 97.5 F (36.4 C) (10/21 0300) Pulse Rate:  [70-73] 70 (10/21 0300) Resp:  [20-34] 20 (10/21 0300) BP: (134-164)/(75-92) 147/81 (10/21 0300) SpO2:  [95 %-98 %] 96 % (10/21 0300) Last BM Date : 04/26/24  Intake/Output from previous day: 10/20 0701 - 10/21 0700 In: -  Out: 400 [Urine:400] Intake/Output this shift: Total I/O In: -  Out: 200 [Urine:200]  General appearance: cooperative Resp: clear to auscultation bilaterally Chest wall: not sig tender anteriorly GI: soft, NT Extremities: unna boot RLE  Lab Results: CBC  No results for input(s): WBC, HGB, HCT, PLT in the last 72 hours. BMET Recent Labs    04/27/24 0519 04/28/24 0232  NA 136 137  K 4.1 4.0  CL 109 109  CO2 20* 20*  GLUCOSE 106* 100*  BUN 36* 31*  CREATININE 0.90 0.84  CALCIUM 8.6* 8.7*   PT/INR No results for input(s): LABPROT, INR in the last 72 hours. ABG No results for input(s): PHART, HCO3 in the last 72 hours.  Invalid input(s): PCO2, PO2  Studies/Results: No results found.  Anti-infectives: Anti-infectives (From admission, onward)    None       Assessment/Plan: GLF  L 6-11 rib fxs with tract PTX - no PTX noted on CXR from 10/12, multimodal pain control, IS AKI on CKD IIIa - No nephrotoxic agents, Cr 0.84 Urinary retention - foley present, add flomax, TOV 10/22 FTT/malnutrition - add Breeze, patient refusing to eat much or take suppliments HTN - home coreg   FEN: reg diet (not eating)/Breeze VTE: heparin  ID: no current abx   Dispo: 4NP, significant FTT baseline, GOC discussions ongoing, appreciate palliative medicine assistance.    LOS: 10 days    Dann Hummer, MD, MPH,  FACS Trauma & General Surgery Use [email or web address] to contact on call provider  04/28/2024

## 2024-04-28 NOTE — TOC Progression Note (Addendum)
 Transition of Care [facility]) - Progression Note    Patient Details  Name: Kathryn [name] MRN: [id number] Date of Birth: 1927/11/04  Transition of Care [facility]) CM/SW Contact  Brysten Reister E Teyona Nichelson, LCSW Phone Number: 04/28/2024, 12:00 PM  Clinical Narrative:    [name] has no routine beds at [facility] or [place] locations. They are continuing to follow for bed availability.   Per Palliative PA, patient open to other hospice locations as well. CSW reached out to [facility] who will review for eligibility.   2:18- Called Cherie with [facility] [place], she is waiting to hear back from her Wellsite geologist.   4:20- Cherie with [facility] [place] states their Wellsite geologist has approved patient and they could accept her at their [employer] or [place] location - no beds today but possibly tomorrow. Left VM for son Darina requesting a return call.   Expected Discharge Plan: Skilled Nursing Facility Barriers to Discharge: Continued Medical Work up               Expected Discharge Plan and Services       Living arrangements for the past 2 months: Single Family Home                                       Social Drivers of Health (SDOH) Interventions SDOH Screenings   Food Insecurity: No Food Insecurity (04/18/2024)  Housing: Low Risk  (04/18/2024)  Transportation Needs: No Transportation Needs (04/18/2024)  Utilities: Not At Risk (04/18/2024)  Social Connections: Patient Declined (04/18/2024)  Tobacco Use: Low Risk  (04/18/2024)    Readmission Risk Interventions    01/09/2024   12:20 PM  Readmission Risk Prevention Plan  Transportation Screening Complete  PCP or Specialist Appt within 5-7 Days Complete  Home Care Screening Complete  Medication Review (RN CM) Complete

## 2024-04-28 NOTE — Progress Notes (Signed)
 Daily Progress Note   Patient Name: Kathryn [name]       Date: 04/28/2024 DOB: 1928-02-14  Age: 89 y.o. MRN#: [id number] Attending Physician: Rolla Seltzer, MD Primary Care Physician: Shayne Anes, MD Admit Date: 04/18/2024  Reason for Consultation/Follow-up: Establishing goals of care  Medical records reviewed including progress notes, labs and imaging. Patient assessed at the bedside. She reports some dyspnea and neck pain, also quite tired. States that dilaudid  does not work for her and neither does repositioning. She would be agreeable to an increased dose. Discussed with RN. No visitors were present.   Created space and opportunity for patient's thoughts and feelings on her current illness.  She acknowledges previous goals of care conversations and current plan to pursue hospice facility placement.  She confirms speaking with them earlier today and her sons have spoken with them as well. She says she has no preference for a hospice facility location and does not think any of her sons would have a preference or location that is more convenient for them either.  Discussed the focus of her care while she is admitted waiting for a bed regardless of location.  Reviewed interventions currently in place and the option to discontinue any interventions or monitoring/lab work not aimed at providing her comfort.  She is not overly bothered or distressed by interventions and would like to keep them in place until she leaves the hospital.  Reviewed her CODE STATUS explored her preferences, recommending consideration of DNR status, understanding evidenced-based poor outcomes in similar hospitalized patients, as the cause of the arrest is likely associated with chronic/terminal disease rather than a reversible  acute cardio-pulmonary event.  She would not want CPR or mechanical ventilation and agrees to DNR/DNI today.  Questions and concerns addressed. PMT will continue to support holistically.   Length of Stay: 10  Physical Exam Vitals and nursing note reviewed.  Constitutional:      General: She is sleeping. She is not in acute distress. HENT:     Head: Normocephalic and atraumatic.  Cardiovascular:     Rate and Rhythm: Normal rate.  Pulmonary:     Effort: Tachypnea present.  Skin:    General: Skin is warm and dry.  Neurological:     Mental Status: She is oriented to person, place, and time and easily aroused.  Psychiatric:        Behavior: Behavior normal.            Vital Signs: BP (!) 147/81 (BP Location: Right Arm)   Pulse 70   Temp (!) 97.5 F (36.4 C) (Axillary)   Resp 20   Ht 5' 4 (1.626 m)   Wt 52.2 kg   SpO2 96%   BMI 19.74 kg/m  SpO2: SpO2: 96 % O2 Device: O2 Device: Room Air O2 Flow Rate: O2 Flow Rate (L/min): 3 L/min    Palliative Care Assessment & Plan   Patient Profile:  89 y.o. female  with past medical history of paroxysmal A-fib not on anticoagulation, pacemaker, hypertension and chronic kidney disease stage IIIa admitted on 04/18/2024 after a fall resulting in L 6-11 rib fx w/ displacement and small PTX.    Patient now with acute kidney  injury on CKD IIIa.   Patient faces treatment option decisions, advanced directive decisions, and anticipatory care needs.  Recommendations/Plan: CODE STATUS changed to DNR/DNI.  Gold form signed and placed on patient's heart chart.  Will scanned copy into EMR. Continue current care plan without escalation while patient remains admitted.  Goal remains to discharge to hospice facility when a bed is available Dilaudid  increased to 1 mg every 4 hours as needed for pain Patient would be open to another hospice facility if a bed became available sooner-primary team and TOC notified Psychosocial and emotional support  provided Continued PMT support    Prognosis: Less than 2 weeks would not be surprising based on acute and chronic illnesses, nutritional/functional decline   Onis Markoff Wonda, PA-C Palliative Medicine Team Team phone # [phone]  Thank you for allowing the Palliative Medicine Team to assist in the care of this patient. Please utilize secure chat with additional questions, if there is no response within 30 minutes please call the above phone number.  Palliative Medicine Team providers are available by phone from 7am to 7pm daily and can be reached through the team cell phone.  Should this patient require assistance outside of these hours, please call the patient's attending physician.  Portions of this note are a verbal dictation therefore any spelling and/or grammatical errors are due to the Dragon Medical One system interpretation.  MDM: High

## 2024-04-28 NOTE — Progress Notes (Signed)
 Physical Therapy Treatment Patient Details Name: Kathryn [name] MRN: [id number] DOB: March 05, 1928 Today's Date: 04/28/2024   History of Present Illness 89 y.o. female admitted 10/11 after sustaining a GLF resulting in L 6-11 rib fx w/ displacement and small PTX.  PMH paroxysmal A-fib not on anticoagulation, pacemaker, hypertension, chronic kidney disease stage IIIa    PT Comments  Pt in better spirits today, agreeable to OOB with focus on increasing tolerance to activity and LE strengthening. Pt tolerating repeated stands this date, endorses legs feel like water and PT educated pt on deconditioning related to lack of mobility given L flank pain over the last several days. PT encouraged daily OOB with staff, for strengthening, skin integrity, and pulmonary health. PT to continue to follow.      If plan is discharge home, recommend the following: Assistance with cooking/housework;Assist for transportation;Help with stairs or ramp for entrance;A lot of help with walking and/or transfers;A lot of help with bathing/dressing/bathroom   Can travel by private vehicle        Equipment Recommendations  None recommended by PT    Recommendations for Other Services       Precautions / Restrictions Precautions Precautions: Fall Recall of Precautions/Restrictions: Intact Precaution/Restrictions Comments: sacral wound Restrictions Weight Bearing Restrictions Per Provider Order: No     Mobility  Bed Mobility Overal bed mobility: Needs Assistance Bed Mobility: Supine to Sit     Supine to sit: Mod assist, +2 for safety/equipment, HOB elevated     General bed mobility comments: assist for LE progression, trunk elevation, scooting to EOB with bed pad assist.    Transfers Overall transfer level: Needs assistance Equipment used: 2 person hand held assist, Rolling walker (2 wheels) Transfers: Sit to/from Stand, Bed to chair/wheelchair/BSC Sit to Stand: Min assist, +2 physical  assistance, +2 safety/equipment           General transfer comment: min A to stand and take a few steps, pt fatigued veyr quickly and needed mod A +2 to sit. Stand x2, from EOB both times first time with RW second with HHA +2.    Ambulation/Gait               General Gait Details: nt - pt too fatigued with LE buckling   Stairs             Wheelchair Mobility     Tilt Bed    Modified Rankin (Stroke Patients Only)       Balance Overall balance assessment: Needs assistance Sitting-balance support: No upper extremity supported, Feet supported Sitting balance-Leahy Scale: Fair Sitting balance - Comments: supervision A for sitting EOB for ~5 minutes   Standing balance support: Bilateral upper extremity supported, During functional activity Standing balance-Leahy Scale: Poor Standing balance comment: reliant on bilat UE support                            Communication Communication Communication: No apparent difficulties  Cognition Arousal: Alert Behavior During Therapy: WFL for tasks assessed/performed   PT - Cognitive impairments: No apparent impairments                         Following commands: Impaired Following commands impaired: Follows one step commands with increased time    Cueing Cueing Techniques: Verbal cues  Exercises      General Comments General comments (skin integrity, edema, etc.): vss, pt endorsing feeling swimmy headed  Pertinent Vitals/Pain Pain Assessment Pain Assessment: Faces Faces Pain Scale: Hurts little more Pain Location: Lt flank Pain Descriptors / Indicators: Aching, Grimacing, Guarding, Sharp Pain Intervention(s): Monitored during session, Limited activity within patient's tolerance, Repositioned    Home Living                          Prior Function            PT Goals (current goals can now be found in the care plan section) Acute Rehab PT Goals Patient Stated Goal:  Get well, reduce pain PT Goal Formulation: With patient Time For Goal Achievement: 05/03/24 Potential to Achieve Goals: Good Progress towards PT goals: Progressing toward goals    Frequency    Min 2X/week      PT Plan      Co-evaluation PT/OT/SLP Co-Evaluation/Treatment: Yes Reason for Co-Treatment: For patient/therapist safety;To address functional/ADL transfers;Complexity of the patient's impairments (multi-system involvement) PT goals addressed during session: Balance;Mobility/safety with mobility OT goals addressed during session: ADL's and self-care      AM-PAC PT 6 Clicks Mobility   Outcome Measure  Help needed turning from your back to your side while in a flat bed without using bedrails?: A Lot Help needed moving from lying on your back to sitting on the side of a flat bed without using bedrails?: A Lot Help needed moving to and from a bed to a chair (including a wheelchair)?: A Lot Help needed standing up from a chair using your arms (e.g., wheelchair or bedside chair)?: A Lot Help needed to walk in hospital room?: Total Help needed climbing 3-5 steps with a railing? : Total 6 Click Score: 10    End of Session   Activity Tolerance: Patient limited by pain;Patient limited by fatigue Patient left: with call bell/phone within reach;in chair;with chair alarm set Nurse Communication: Mobility status PT Visit Diagnosis: Muscle weakness (generalized) (M62.81);History of falling (Z91.81);Pain Pain - Right/Left: Left     Time: 8675-8649 PT Time Calculation (min) (ACUTE ONLY): 26 min  Charges:    $Therapeutic Activity: 8-22 mins PT General Charges $$ ACUTE PT VISIT: 1 Visit                     Kathryn [name], PT DPT Acute Rehabilitation Services Secure Chat Preferred  Office [phone]    Kathryn [name] 04/28/2024, 4:50 PM

## 2024-04-28 NOTE — Plan of Care (Signed)
   Problem: Education: Goal: Knowledge of General Education information will improve Description Including pain rating scale, medication(s)/side effects and non-pharmacologic comfort measures Outcome: Progressing   Problem: Health Behavior/Discharge Planning: Goal: Ability to manage health-related needs will improve Outcome: Progressing

## 2024-04-29 LAB — RENAL FUNCTION PANEL
Albumin: 2.5 g/dL — ABNORMAL LOW (ref 3.5–5.0)
Anion gap: 8 (ref 5–15)
BUN: 28 mg/dL — ABNORMAL HIGH (ref 8–23)
CO2: 20 mmol/L — ABNORMAL LOW (ref 22–32)
Calcium: 8.9 mg/dL (ref 8.9–10.3)
Chloride: 110 mmol/L (ref 98–111)
Creatinine, Ser: 0.95 mg/dL (ref 0.44–1.00)
GFR, Estimated: 56 mL/min — ABNORMAL LOW (ref >60)
Glucose, Bld: 107 mg/dL — ABNORMAL HIGH (ref 70–99)
Phosphorus: 2.3 mg/dL — ABNORMAL LOW (ref 2.5–4.6)
Potassium: 3.9 mmol/L (ref 3.5–5.1)
Sodium: 138 mmol/L (ref 135–145)

## 2024-04-29 MED ORDER — METHOCARBAMOL 750 MG PO TABS: 750.0000 mg | ORAL_TABLET | Freq: Four times a day (QID) | ORAL | Status: DC

## 2024-04-29 NOTE — Plan of Care (Signed)
   Medical records reviewed including progress notes, labs and imaging. Patient has been accepted to [facility]. DNR signed and placed on hard chart for discharge. Goals of care are clear and no other acute palliative needs identified.  PMT remains available peripherally if needs arise.  Thank you for your referral and allowing PMT to assist in Kathryn [name]'s care.   Shamel Galyean, PA-C Palliative Medicine Team  Team Phone # [phone]   NO CHARGE

## 2024-04-29 NOTE — Progress Notes (Signed)
 Progress Note     Subjective: Pt has not felt like eating today, does report eating some supper last night. Per palliative discussion yesterday patient changed to DNR/DNI. Reports she has not spoken with sons yet today. She is still willing to proceed with hospice placement.   Objective: Vital signs in last 24 hours: Temp:  [97.6 F (36.4 C)-98 F (36.7 C)] 97.8 F (36.6 C) (10/22 0836) Pulse Rate:  [69-73] 73 (10/22 0836) Resp:  [11-21] 20 (10/22 0836) BP: (133-178)/(74-83) 178/83 (10/22 0836) SpO2:  [93 %-96 %] 96 % (10/22 0836) Last BM Date : 04/26/24  Intake/Output from previous day: 10/21 0701 - 10/22 0700 In: -  Out: 325 [Urine:325] Intake/Output this shift: No intake/output data recorded.  PE: General: pleasant, WD, elderly female, NAD but appears uncomfortable Heart: regular, rate, and rhythm. paced Lungs: CTAB, no wheezes, rhonchi, or rales noted.  Respiratory effort nonlabored Abd: soft, NT, ND, no masses, hernias, or organomegaly Neuro: Cranial nerves 2-12 grossly intact, sensation is normal throughout Ext: BUEs with significant edema Psych: A&Ox3 with an appropriate affect.     Lab Results:  No results for input(s): WBC, HGB, HCT, PLT in the last 72 hours.  BMET Recent Labs    04/28/24 0232 04/29/24 0230  NA 137 138  K 4.0 3.9  CL 109 110  CO2 20* 20*  GLUCOSE 100* 107*  BUN 31* 28*  CREATININE 0.84 0.95  CALCIUM 8.7* 8.9   PT/INR No results for input(s): LABPROT, INR in the last 72 hours. CMP     Component Value Date/Time   NA 138 04/29/2024 0230   NA 139 10/14/2023 1539   K 3.9 04/29/2024 0230   CL 110 04/29/2024 0230   CO2 20 (L) 04/29/2024 0230   GLUCOSE 107 (H) 04/29/2024 0230   BUN 28 (H) 04/29/2024 0230   BUN 28 10/14/2023 1539   CREATININE 0.95 04/29/2024 0230   CREATININE 1.00 (H) 09/27/2015 1426   CALCIUM 8.9 04/29/2024 0230   PROT 5.4 (L) 04/22/2024 1929   ALBUMIN 2.5 (L) 04/29/2024 0230   AST 27 04/22/2024  1929   ALT 18 04/22/2024 1929   ALKPHOS 89 04/22/2024 1929   BILITOT 1.8 (H) 04/22/2024 1929   GFRNONAA 56 (L) 04/29/2024 0230   GFRAA >60 02/14/2015 1200   Lipase     Component Value Date/Time   LIPASE 34 04/18/2024 1410       Studies/Results: No results found.  Anti-infectives: Anti-infectives (From admission, onward)    None        Assessment/Plan GLF  L 6-11 rib fxs with tract PTX - no PTX noted on CXR from 10/12, multimodal pain control, IS AKI on CKD IIIa - No nephrotoxic agents, Cr 0.9, UOP 325 cc for last 24 hrs Urinary retention - foley present, flomax FTT/malnutrition - add Breeze, patient refusing HTN - restarted home coreg   FEN: reg diet (not eating)/Breeze VTE: heparin  ID: no current abx   Dispo: 4NP, GOC discussions ongoing, appreciate palliative medicine assistance. Patient remains appropriate for hospice which she is agreeable to. She has a bed available today but would like to talk with sons.    LOS: 11 days   I reviewed Consultant palliative notes, last 24 h vitals and pain scores, last 48 h intake and output, last 24 h labs and trends, and last 24 h imaging results.  This care required moderate level of medical decision making.    Burnard JONELLE Louder, [facility] Surgery 04/29/2024, 11:26  AM Please see Amion for pager number during day hours 7:00am-4:30pm

## 2024-04-29 NOTE — Progress Notes (Signed)
 Nurse asked patient did she want to be repositioned on turn on her side patient stated that she just wanted to sit up. [name], RN

## 2024-04-29 NOTE — TOC Transition Note (Signed)
 Transition of Care [facility]) - Discharge Note   Patient Details  Name: Kathryn [name] MRN: [id number] Date of Birth: Sep 22, 1927  Transition of Care [facility]) CM/SW Contact:  Keelon Zurn M, RN Phone Number: 04/29/2024, 11:29   Clinical Narrative:    Residential Hospice Facility bed available at [facility], per Jenne Berber with [facility], and son prefers this facility.  Bedside nurse will need to call report to [phone].   PTAR notified for transport at 12:43pm, and son notified that non-emergent transport has been notified.     Final next level of care: Hospice Medical Facility Barriers to Discharge: Barriers Resolved   Patient Goals and CMS Choice Patient states their goals for this hospitalization and ongoing recovery are:: STR CMS Medicare.gov Compare Post Acute Care list provided to:: Patient Choice offered to / list presented to : Patient                            Discharge Plan and Services Additional resources added to the After Visit Summary for     Discharge Planning Services: CM Consult Post Acute Care Choice: Residential Hospice Bed                               Social Drivers of Health (SDOH) Interventions SDOH Screenings   Food Insecurity: No Food Insecurity (04/18/2024)  Housing: Low Risk  (04/18/2024)  Transportation Needs: No Transportation Needs (04/18/2024)  Utilities: Not At Risk (04/18/2024)  Social Connections: Patient Declined (04/18/2024)  Tobacco Use: Low Risk  (04/18/2024)     Readmission Risk Interventions    01/09/2024   12:20 PM  Readmission Risk Prevention Plan  Transportation Screening Complete  PCP or Specialist Appt within 5-7 Days Complete  Home Care Screening Complete  Medication Review (RN CM) Complete   Mliss MICAEL Fass, RN, BSN  Trauma/Neuro ICU Case Manager [phone]

## 2024-04-29 NOTE — Progress Notes (Signed)
 [facility]   This pt was introduced to hospice philosophy and transition from hospital level of care  to the comfort care at the Hospice facility. Her son Darina was also involved in the conversation. The pt agrees to full comfort and acknowledges DNR to be her wishes.   Paperwork was completed at bedside for the pt and she can trasnfer to our facility today. Our MD reviewed the paperwork and does feel pt is appropriate.   The pt has requested [facility] the [facility].   Magdalena Berber RN [phone]

## 2024-04-29 NOTE — Plan of Care (Signed)

## 2024-05-01 ENCOUNTER — Ambulatory Visit: Admitting: Surgical

## 2024-05-09 DEATH — deceased

## 2024-07-15 ENCOUNTER — Encounter: Payer: Self-pay | Admitting: Cardiovascular Disease

## 2024-09-08 ENCOUNTER — Ambulatory Visit: Payer: Self-pay

## 2024-12-08 ENCOUNTER — Ambulatory Visit: Payer: Self-pay

## 2025-03-09 ENCOUNTER — Ambulatory Visit: Payer: Self-pay

## 2025-06-08 ENCOUNTER — Ambulatory Visit: Payer: Self-pay

## 2025-09-07 ENCOUNTER — Ambulatory Visit: Payer: Self-pay
